# Patient Record
Sex: Female | Born: 1935
Health system: Southern US, Community
[De-identification: ages and names within clinical notes are randomized; demographics above are authoritative.]

## PROBLEM LIST (undated history)

## (undated) DIAGNOSIS — Z87898 Personal history of other specified conditions: Secondary | ICD-10-CM

## (undated) DIAGNOSIS — R569 Unspecified convulsions: Secondary | ICD-10-CM

## (undated) DIAGNOSIS — I2510 Atherosclerotic heart disease of native coronary artery without angina pectoris: Secondary | ICD-10-CM

## (undated) DIAGNOSIS — M1990 Unspecified osteoarthritis, unspecified site: Secondary | ICD-10-CM

## (undated) DIAGNOSIS — I10 Essential (primary) hypertension: Secondary | ICD-10-CM

## (undated) DIAGNOSIS — J849 Interstitial pulmonary disease, unspecified: Secondary | ICD-10-CM

## (undated) DIAGNOSIS — L409 Psoriasis, unspecified: Secondary | ICD-10-CM

## (undated) DIAGNOSIS — F32A Depression, unspecified: Secondary | ICD-10-CM

## (undated) DIAGNOSIS — F329 Major depressive disorder, single episode, unspecified: Secondary | ICD-10-CM

## (undated) DIAGNOSIS — K219 Gastro-esophageal reflux disease without esophagitis: Secondary | ICD-10-CM

## (undated) DIAGNOSIS — E7800 Pure hypercholesterolemia, unspecified: Secondary | ICD-10-CM

## (undated) HISTORY — PX: DILATION AND CURETTAGE OF UTERUS: SHX78

## (undated) HISTORY — DX: Unspecified convulsions: R56.9

## (undated) HISTORY — DX: Atherosclerotic heart disease of native coronary artery without angina pectoris: I25.10

## (undated) HISTORY — DX: Essential (primary) hypertension: I10

## (undated) HISTORY — PX: WRIST SURGERY: SHX841

## (undated) HISTORY — DX: Personal history of other specified conditions: Z87.898

## (undated) HISTORY — DX: Interstitial pulmonary disease, unspecified: J84.9

## (undated) HISTORY — DX: Pure hypercholesterolemia, unspecified: E78.00

## (undated) HISTORY — DX: Psoriasis, unspecified: L40.9

---

## 2011-01-24 HISTORY — PX: COLONOSCOPY: SHX174

## 2017-11-03 ENCOUNTER — Other Ambulatory Visit (INDEPENDENT_AMBULATORY_CARE_PROVIDER_SITE_OTHER): Payer: Medicare HMO

## 2017-11-03 ENCOUNTER — Encounter: Payer: Self-pay | Admitting: Internal Medicine

## 2017-11-03 ENCOUNTER — Ambulatory Visit (INDEPENDENT_AMBULATORY_CARE_PROVIDER_SITE_OTHER): Payer: Medicare HMO | Admitting: Internal Medicine

## 2017-11-03 VITALS — BP 124/64 | HR 72 | Ht 65.0 in | Wt 150.4 lb

## 2017-11-03 DIAGNOSIS — Z872 Personal history of diseases of the skin and subcutaneous tissue: Secondary | ICD-10-CM

## 2017-11-03 DIAGNOSIS — R0609 Other forms of dyspnea: Secondary | ICD-10-CM

## 2017-11-03 DIAGNOSIS — J849 Interstitial pulmonary disease, unspecified: Secondary | ICD-10-CM | POA: Diagnosis not present

## 2017-11-03 DIAGNOSIS — R053 Chronic cough: Secondary | ICD-10-CM

## 2017-11-03 DIAGNOSIS — R05 Cough: Secondary | ICD-10-CM

## 2017-11-03 DIAGNOSIS — Z87891 Personal history of nicotine dependence: Secondary | ICD-10-CM | POA: Diagnosis not present

## 2017-11-03 LAB — SEDIMENTATION RATE: Sed Rate: 34 mm/hr — ABNORMAL HIGH (ref 0–30)

## 2017-11-03 NOTE — Patient Instructions (Signed)
ICD-10-CM   1. ILD (interstitial lung disease) ([place]) J84.9   2. History of psoriasis Z87.2   3. Stopped smoking with greater than 40 pack year history Z87.891   4. Dyspnea on exertion R06.09   5. Chronic cough R05      - I am concerned you  have Interstitial Lung Disease (ILD)  -  There are many varieties of this - To narrow down possibilities and assess severity please do the following tests  Plan  - do HRCT supine and prone at [place] and dlco only. No lung volume or bd response. No post-bd spiro - can do at [facility] - On 11/03/2017 do Serum: ESR, ACE, ANA, DS-DNA, RF, anti-CCP, ssA, ssB, scl-70, ANCA screen, MPO, PR-3, Total CK,  RNP, Aldolase,  Hypersensitivity Pneumonitis Panel   Followup - next few to several weeks at ILD clinic tue PM to regroup about results

## 2017-11-03 NOTE — Progress Notes (Signed)
Subjective:    Patient ID: Ashlee Martin, female    DOB: 03/02/1931, 84 y.o.   MRN: [id number]  PCP Ashlee Chroman, MD  HPI  IOV 11/03/2017  Chief Complaint  Patient presents with  . Consult    Self referral. Pt states she was diagnosed with pulmonary fibrosis x4 weeks ago. States she has c/o cough with green mucus, mild SOB. Denies any CP.    84 year old female self-referred for evaluation of pulmonary fibrosis.  She is fairly good historian.  She tells me that she has diagnosis of psoriasis of the skin in 2016 and was under the care of Ashlee Martin in [place].  She was on Humira for 11 months taking monthly injections but her rash got bad in November 2017 and then she was treated with prednisone.  She went on vacation to [place] and then returned and then had a diagnosis of pneumonia.  She was hospitalized for this at [facility] in January 2018.  She was on oxygen and hospitalized for 3 or 4 days and then discharged.  After that she is always had some amount of residual cough and mild shortness of breath but starting approximately 6 months ago the cough and shortness of breath was more perceptible.  However in the last 2 months a significant acceleration in the severity of cough and shortness of breath.  The cough is described as waking up at night and associated with phlegm.  She also gets short of breath walking or hurrying on level ground or walking up a slight hill.  Her primary care physician did a CT chest with contrast in February 2019.  I do not have the image with me but I reviewed the report and he reports pulmonary fibrosis not otherwise specified.  She did end up in the emergency room on October 31, 2017 and is being discharged with [facility] and redness on taper which is helping.   [employer] of chest physicians interstitial lung disease questionnaire  Past medical history: Positive for seizures, history of pneumonia in January 2018, acid reflux disease, dry eyes, dry mouth,  photophobia and arthralgia.  She has diagnosis of skin psoriasis.  This is not much of an issue at this point in time  Personal exposure history: She started smoking cigarettes at 84 years of age and smoked 3-4 packs a day and quit in the mid 1980s.  She reports having had use street drugs.  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  She only occasionally visits the coop.  She opens the door and looks that she can out.  Otherwise no exposure to water damage or mold a humidifier [place] or hot tub or Jacuzzi or birds or feathered pillows.  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  Then she retired and moved to [place] in [place] where she lived in normal homes for 5 years up until 8 years ago and then she and her husband moved to [place], [place] where they live in a farm.  Pulmonary toxicity history: Other than exposure to Humira and short-term prednisone no other pulmonary toxicity history including nitrofurantoin or BCG or amiodarone or chemotherapy    Walking desaturation test on 11/03/2017 185 feet x 3 laps on ROOM AIR:  did walk at slow pace and got mildly dyspneic. Did desaturate to 89%. Rest pulse ox was 98%, final pulse ox  was 89%. HR response 67/min at rest to 91/min at peak exertion. [name]  Did not Desaturate < 88% . Ashlee Martin did yes  Desaturated </= 3% points. Ashlee Martin yes did get tachyardic      has no past medical history on file.   reports that she quit smoking about 45 years ago. Her smoking use included cigarettes. She has a 60.00 pack-year smoking history. she has never used smokeless tobacco.   Allergies  Allergen Reactions  . Humira [Adalimumab]     Immunization History  Administered Date(s) Administered  . Influenza, High Dose Seasonal PF 05/25/2017    No family history on file.   Current Outpatient  Medications:  .  aspirin (GOODSENSE ASPIRIN) 325 MG tablet, Take 325 mg by mouth., Disp: , Rfl:  .  atenolol (TENORMIN) 50 MG tablet, , Disp: , Rfl:  .  benzonatate (TESSALON) 100 MG capsule, TAKE 1 TO 2 CAPSULES BY MOUTH THREE TIMES DAILY AS NEEDED FOR CONGESTION AND COUGH, Disp: , Rfl: 0 .  cefdinir (OMNICEF) 300 MG capsule, Take 300 mg by mouth 2 (two) times daily., Disp: , Rfl: 0 .  doxylamine, Sleep, (UNISOM) 25 MG tablet, Take 25 mg by mouth at bedtime as needed., Disp: , Rfl:  .  predniSONE (DELTASONE) 20 MG tablet, TAKE 1 TAB BY MOUTH 3 TIMES A DAY FOR 4 DAYS THEN 1 TWICE A DAY FOR 3 DAYS THEN 1 DAILY FOR 2 DAYS, Disp: , Rfl: 0 .  rOPINIRole (REQUIP) 2 MG tablet, , Disp: , Rfl:  .  simvastatin (ZOCOR) 20 MG tablet, , Disp: , Rfl:  .  VENTOLIN HFA 108 (90 Base) MCG/ACT inhaler, INHALE 1 TO 2 PUFFS BY MOUTH FOUR TIMES A DAY AS NEEDED FOR CHEST CONGESTION/COUGH, Disp: , Rfl: 0 .  vitamin B-12 (CYANOCOBALAMIN) 1000 MCG tablet, Take 1,000 mcg by mouth daily., Disp: , Rfl:     Review of Systems  Constitutional: Negative for fever and unexpected weight change.  HENT: Positive for congestion, postnasal drip, sinus pressure and sneezing. Negative for dental problem, ear pain, nosebleeds, rhinorrhea, sore throat and trouble swallowing.   Eyes: Negative for redness and itching.  Respiratory: Positive for cough, shortness of breath and wheezing. Negative for chest tightness.   Cardiovascular: Negative for palpitations and leg swelling.  Gastrointestinal: Positive for nausea and vomiting.  Genitourinary: Negative for dysuria.  Musculoskeletal: Positive for joint swelling.  Skin: Negative for rash.  Allergic/Immunologic: Negative.  Negative for environmental allergies, food allergies and immunocompromised state.  Neurological: Positive for headaches.  Hematological: Does not bruise/bleed easily.  Psychiatric/Behavioral: Positive for dysphoric mood. The patient is nervous/anxious.          Objective:   Physical Exam  Constitutional: She is oriented to person, place, and time. She appears well-developed and well-nourished. No distress.  Somewhat frail  HENT:  Head: Normocephalic and atraumatic.  Right Ear: External ear normal.  Left Ear: External ear normal.  Mouth/Throat: Oropharynx is clear and moist. No oropharyngeal exudate.  Eyes: Conjunctivae and EOM are normal. Pupils are equal, round, and reactive to light. Right eye exhibits no discharge. Left eye exhibits no discharge. No scleral icterus.  Neck: Normal range of motion. Neck supple. No JVD present. No tracheal deviation present. No thyromegaly present.  Cardiovascular: Normal rate, regular rhythm, normal heart sounds and intact distal pulses. Exam reveals no gallop and no friction rub.  No murmur heard. Pulmonary/Chest: Effort normal. No respiratory distress. She has no wheezes. She has rales.  She exhibits no tenderness.  Crackles at base and bilateral UL. Not classic UIP crackkles  Abdominal: Soft. Bowel sounds are normal. She exhibits no distension and no mass. There is no tenderness. There is no rebound and no guarding.  Musculoskeletal: Normal range of motion. She exhibits no edema or tenderness.  Lymphadenopathy:    She has no cervical adenopathy.  Neurological: She is alert and oriented to person, place, and time. She has normal reflexes. No cranial nerve deficit. She exhibits normal muscle tone. Coordination normal.  Skin: Skin is warm and dry. No rash noted. She is not diaphoretic. No erythema. No pallor.  Psychiatric: She has a normal mood and affect. Her behavior is normal. Judgment and thought content normal.  Vitals reviewed.   Vitals:   11/03/17 0901  BP: 124/64  Pulse: 72  SpO2: 96%  Weight: 150 lb 6.4 oz (68.2 kg)  Height: _0  (1.651 m)   Body mass index is 25.03 kg/m.       Assessment & Plan:     ICD-10-CM   1. ILD (interstitial lung disease) ([place]) J84.9   2. History of psoriasis  Z87.2   3. Stopped smoking with greater than 40 pack year history Z87.891   4. Dyspnea on exertion R06.09   5. Chronic cough R05        - I am concerned you  have Interstitial Lung Disease (ILD) - ddx is HP, NSIP, DIP based on smoking, Connective Tissue ILD and then IPF  -- To narrow down possibilities and assess severity please do the following tests  Plan  - do HRCT supine and prone at [place] and dlco only. No lung volume or bd response. No post-bd spiro - can do at [facility] - On 11/03/2017 do Serum: ESR, ACE, ANA, DS-DNA, RF, anti-CCP, ssA, ssB, scl-70, ANCA screen, MPO, PR-3, Total CK,  RNP, Aldolase,  Hypersensitivity Pneumonitis Panel   Followup - next few to several weeks at ILD clinic tue PM to regroup about results     Dr. Brand Males, M.D., [facility].C.P Pulmonary and Critical Care Medicine Staff Physician, [place] Director - Interstitial Lung Disease  Program  Pulmonary [place] at [place], [place], [address]  Pager: [phone], If no answer or between  15:00h - 7:00h: call [phone] Telephone: [phone]

## 2017-11-04 LAB — RNP ANTIBODIES

## 2017-11-05 LAB — ANA: ANA: NEGATIVE

## 2017-11-09 LAB — SJOGREN'S SYNDROME ANTIBODS(SSA + SSB)
SSA (Ro) (ENA) Antibody, IgG: <1 AI
SSB (La) (ENA) Antibody, IgG: 1.8 AI — AB

## 2017-11-09 LAB — CK TOTAL AND CKMB (NOT AT ARMC)
CK TOTAL: 38 U/L (ref 29–143)
CK, MB: 1.4 ng/mL (ref 0–5.0)
Relative Index: 3.7 (ref 0–4.0)

## 2017-11-09 LAB — ANTI-SCLERODERMA ANTIBODY: Scleroderma (Scl-70) (ENA) Antibody, IgG: <1 AI

## 2017-11-09 LAB — HYPERSENSITIVITY PNUEMONITIS PROFILE
ASPERGILLUS FUMIGATUS: NEGATIVE
FAENIA RETIVIRGULA: NEGATIVE
Pigeon Serum: NEGATIVE
S. VIRIDIS: NEGATIVE
T. CANDIDUS: NEGATIVE
T. VULGARIS: NEGATIVE

## 2017-11-09 LAB — ANA IFA W/REFL: Anti Nuclear Antibody(ANA): NEGATIVE

## 2017-11-09 LAB — MPO/PR-3 (ANCA) ANTIBODIES

## 2017-11-09 LAB — CYCLIC CITRUL PEPTIDE ANTIBODY, IGG: Cyclic Citrullin Peptide Ab: <16 UNITS

## 2017-11-09 LAB — RHEUMATOID FACTOR

## 2017-11-09 LAB — ANTI-DNA ANTIBODY, DOUBLE-STRANDED

## 2017-11-09 LAB — ALDOLASE: ALDOLASE: 4.9 U/L (ref <=8.1)

## 2017-11-09 LAB — ANGIOTENSIN CONVERTING ENZYME: ANGIOTENSIN-CONVERTING ENZYME: 28 U/L (ref 9–67)

## 2017-11-09 LAB — ANCA SCREEN W REFLEX TITER: ANCA Screen: NEGATIVE

## 2017-11-10 ENCOUNTER — Telehealth: Payer: Self-pay | Admitting: Internal Medicine

## 2017-11-10 MED ORDER — BENZONATATE 100 MG PO CAPS: ORAL_CAPSULE | ORAL | 0 refills | Status: DC

## 2017-11-10 NOTE — Telephone Encounter (Signed)
Spoke with pt, she states she would like a refill on Prednisone and Tessalon. She states the prednisone is the only thing that is keeping her a float with her lungs and keeps her from coughing so much. I see she was on a taper, can we send in Prednisone 20 mg? She only takes one pill daily. Please advise if I can send in because I wasn't sure looking at her last OV.   Current Outpatient Medications on File Prior to Visit  Medication Sig Dispense Refill  . aspirin (GOODSENSE ASPIRIN) 325 MG tablet Take 325 mg by mouth.    Marland Kitchen atenolol (TENORMIN) 50 MG tablet     . cefdinir (OMNICEF) 300 MG capsule Take 300 mg by mouth 2 (two) times daily.  0  . doxylamine, Sleep, (UNISOM) 25 MG tablet Take 25 mg by mouth at bedtime as needed.    . predniSONE (DELTASONE) 20 MG tablet TAKE 1 TAB BY MOUTH 3 TIMES A DAY FOR 4 DAYS THEN 1 TWICE A DAY FOR 3 DAYS THEN 1 DAILY FOR 2 DAYS  0  . rOPINIRole (REQUIP) 2 MG tablet     . simvastatin (ZOCOR) 20 MG tablet     . VENTOLIN HFA 108 (90 Base) MCG/ACT inhaler INHALE 1 TO 2 PUFFS BY MOUTH FOUR TIMES A DAY AS NEEDED FOR CHEST CONGESTION/COUGH  0  . vitamin B-12 (CYANOCOBALAMIN) 1000 MCG tablet Take 1,000 mcg by mouth daily.     No current facility-administered medications on file prior to visit.    Allergies  Allergen Reactions  . Humira [Adalimumab]

## 2017-11-12 MED ORDER — BENZONATATE 100 MG PO CAPS: ORAL_CAPSULE | ORAL | 0 refills | Status: DC

## 2017-11-12 MED ORDER — PREDNISONE 20 MG PO TABS: ORAL_TABLET | ORAL | 0 refills | Status: DC

## 2017-11-12 NOTE — Telephone Encounter (Signed)
Spoke with the pt and notified of recs per RA  She verbalized understanding  Rx refills sent  Will forward to MR to address prednisone going forward, as I only sent 7 days worth per RA's request

## 2017-11-12 NOTE — Telephone Encounter (Signed)
MR note does not address OK to give 1 week supply of predniosone until he can address  OK to refill tessalon

## 2017-11-12 NOTE — Telephone Encounter (Signed)
Sending to DOD, as MR is unavailable.   RA please advise. thanks

## 2017-11-13 ENCOUNTER — Ambulatory Visit (HOSPITAL_COMMUNITY)
Admission: RE | Admit: 2017-11-13 | Discharge: 2017-11-13 | Disposition: A | Discharge status: Home / Self Care | Payer: Medicare HMO | Source: Ambulatory Visit | Attending: Internal Medicine | Admitting: Internal Medicine

## 2017-11-13 ENCOUNTER — Encounter (HOSPITAL_COMMUNITY): Payer: Self-pay | Admitting: Radiology

## 2017-11-13 DIAGNOSIS — J849 Interstitial pulmonary disease, unspecified: Secondary | ICD-10-CM | POA: Diagnosis not present

## 2017-11-13 DIAGNOSIS — I2510 Atherosclerotic heart disease of native coronary artery without angina pectoris: Secondary | ICD-10-CM | POA: Diagnosis not present

## 2017-11-13 DIAGNOSIS — I700 Atherosclerosis of aorta: Secondary | ICD-10-CM | POA: Insufficient documentation

## 2017-11-16 NOTE — Telephone Encounter (Signed)
MR, please advise about the future prednisone refills. Thanks!

## 2017-11-16 NOTE — Telephone Encounter (Signed)
lmtcb x1 for pt. 

## 2017-11-16 NOTE — Telephone Encounter (Signed)
Please call Ranee Ertl - I am just starting ILD workup . Results so far  1. Does have ILD  2. Autoimmune fairly negatie 3. Await PFT and OV  So,  `1. Was she on chronic daily prednisone before seeing me? If so by whom and since when? Because I thought she was only on taper  2. Tessalon ok to refill   Thanks  Dr. Brand Males, M.D., [facility].C.P Pulmonary and Critical Care Medicine Staff Physician, [place] Director - Interstitial Lung Disease  Program  Pulmonary [place] at [place], [place], [address]  Pager: [phone], If no answer or between  15:00h - 7:00h: call [phone] Telephone: [phone]

## 2017-11-17 NOTE — Telephone Encounter (Signed)
Attempted to contact pt. No answer, no option to leave a message. Will try back.  

## 2017-11-18 ENCOUNTER — Telehealth: Payer: Self-pay | Admitting: Internal Medicine

## 2017-11-18 NOTE — Telephone Encounter (Signed)
Please telephone encounter 11/10/17. lmtcb x1 for pt.

## 2017-11-18 NOTE — Telephone Encounter (Signed)
ATC x 3. No VM. Will close this message.

## 2017-11-19 MED ORDER — PREDNISONE 10 MG PO TABS: ORAL_TABLET | ORAL | 0 refills | Status: DC

## 2017-11-19 NOTE — Telephone Encounter (Signed)
MR, pt currently has the appt scheduled with you 5/7.  Does she need to come in for an appt sooner than that to discuss the prednisone with you or do you want to wait until that visit on 5/7 to address med with pt?

## 2017-11-19 NOTE — Telephone Encounter (Signed)
Called and spoke with patient, advised her of response from MR. I will send the prescription into the pharmacy and will route this message to EP so that she can find a spot for the patient to come in after the prednisone is finished.   EP please follow up on this, thanks.

## 2017-11-19 NOTE — Telephone Encounter (Signed)
Called and spoke with pt letting her know that she did have ILD and stated to her the results of the labwork.  Asked pt who previously prescribed her the prednisone and asked her if she was taking prednisone every day or if it was a pred taper.  Pt stated to me Dr. Woody Seller was the previous one who prescribed prednisone to her and it was a pred taper she was doing.  Pt stated once she was not on prednisone, she got sick again with all the same symptoms she previously had when she was not on it.  MR, please advise if you want to put pt on a maintenance dose of prednisone for her to take daily or if you want to wait until her upcoming OV with you 12/29/17 to discuss everything with her after the PFT is done?

## 2017-11-19 NOTE — Telephone Encounter (Signed)
Wait till 12/29/17

## 2017-11-19 NOTE — Telephone Encounter (Signed)
Noted.  Called pt to make her aware we would just address prednisone at her upcoming visit on 12/29/17.  Nothing further needed at this time.

## 2017-11-19 NOTE — Telephone Encounter (Signed)
She can do another 8d prednisone but after that need to seee her again to discus  Take prednisone 40 mg daily x 2 days, then 36m daily x 2 days, then 123mdaily x 2 days, then 26m2maily x 2 days and stop

## 2017-11-26 ENCOUNTER — Other Ambulatory Visit: Payer: Self-pay | Admitting: Internal Medicine

## 2017-12-08 ENCOUNTER — Ambulatory Visit (INDEPENDENT_AMBULATORY_CARE_PROVIDER_SITE_OTHER): Payer: Medicare HMO | Admitting: Adult Health

## 2017-12-08 ENCOUNTER — Encounter: Payer: Self-pay | Admitting: Adult Health

## 2017-12-08 ENCOUNTER — Telehealth: Payer: Self-pay | Admitting: Adult Health

## 2017-12-08 DIAGNOSIS — J309 Allergic rhinitis, unspecified: Secondary | ICD-10-CM | POA: Insufficient documentation

## 2017-12-08 DIAGNOSIS — J301 Allergic rhinitis due to pollen: Secondary | ICD-10-CM

## 2017-12-08 DIAGNOSIS — J84112 Idiopathic pulmonary fibrosis: Secondary | ICD-10-CM | POA: Insufficient documentation

## 2017-12-08 DIAGNOSIS — J849 Interstitial pulmonary disease, unspecified: Secondary | ICD-10-CM | POA: Diagnosis not present

## 2017-12-08 NOTE — Telephone Encounter (Signed)
Called patient, unable to reach left message to give [place] a call back.

## 2017-12-08 NOTE — Assessment & Plan Note (Signed)
ILD , autoimmune neg workup  PFT pending  Cough improved after steroids   Plan  Patient Instructions  Begin Zyrtec 62m At bedtime  .  Begin Saline  Nasal spray /Ashlee AlstromTwice daily  .  Begin Flonase 2 puffs daily in am .  Follow up with Dr. RChase Callernext month for PFT and .As needed   Please contact office for sooner follow up if symptoms do not improve or worsen or seek emergency care

## 2017-12-08 NOTE — Assessment & Plan Note (Signed)
Seasonal allergies   Plan  Patient Instructions  Begin Zyrtec 66m At bedtime  .  Begin Saline  Nasal spray /Laretta AlstromTwice daily  .  Begin Flonase 2 puffs daily in am .  Follow up with Dr. RChase Callernext month for PFT and .As needed   Please contact office for sooner follow up if symptoms do not improve or worsen or seek emergency care

## 2017-12-08 NOTE — Progress Notes (Signed)
_0  ID: Ashlee Martin, female    DOB: [birth year], 84 y.o.   MRN: [id number]  Chief Complaint  Patient presents with  . Acute Visit    Referring provider: Glenda Chroman, MD  HPI:  84 year old female former smoker seen for pulmonary consult March 2019 for pulmonary fibrosis Past medical history significant for psoriasis   12/08/2017 Acute OV :Sinus congestion .  Patient presents for an acute office visit.  Patient complains of sinus congestion , ear fullness and popping , sinus pressure , drippy nose, watery eyes, very little cough .  Has had this for last few months , Worse for the last 2 weeks .  Seen by PCP last week started on allergy pill and nasal spray , she does not know what they were.  Says she has chronic allergy problems , worse in Spring and Fall.  She denies any discolored mucus, sinus pain, teeth pain, fever, nausea vomiting diarrhea, or chest pain.    Patient was seen for pulmonary consult March 2019 to establish for pulmonary fibrosis.  She has underlying psoriasis.  Patient was set up for a CT chest that showed fibrotic interstitial lung disease with mild honeycombing.  Autoimmune panel was essentially negative except for SS B IgG antibody.  And sed rate was mildly elevated at 34. Patient has an upcoming PFT and follow-up visit with ILD clinic in 2 weeks. She was having a severe cough last visit.  She was given a prednisone taper.  Patient says she did require one other prednisone taper but her cough is almost totally resolved.  She has a little mild cough in the morning but very mild.  Allergies  Allergen Reactions  . Humira [Adalimumab] Hives    Immunization History  Administered Date(s) Administered  . Influenza, High Dose Seasonal PF 05/25/2017  . Pneumococcal Conjugate-13 12/09/2014    History reviewed. No pertinent past medical history.  Tobacco History: Social History   Tobacco Use  Smoking Status Former Smoker  . Packs/day: 3.00  . Years:  20.00  . Pack years: 60.00  . Types: Cigarettes  . Last attempt to quit: 11/03/1972  . Years since quitting: 45.1  Smokeless Tobacco Never Used   Counseling given: Not Answered   Outpatient Encounter Medications as of 12/08/2017  Medication Sig  . aspirin (GOODSENSE ASPIRIN) 325 MG tablet Take 325 mg by mouth.  Marland Kitchen atenolol (TENORMIN) 50 MG tablet   . benzonatate (TESSALON) 100 MG capsule TAKE 1 TO 2 CAPSULES BY MOUTH THREE TIMES DAILY AS NEEDED FOR CONGESTION AND COUGH  . doxylamine, Sleep, (UNISOM) 25 MG tablet Take 25 mg by mouth at bedtime as needed.  . predniSONE (DELTASONE) 10 MG tablet 40 mg daily x 2 days, then 44m daily x 2 days, then 168mdaily x 2 days, then 74m71maily x 2 days and stop  . rOPINIRole (REQUIP) 2 MG tablet   . simvastatin (ZOCOR) 20 MG tablet   . VENTOLIN HFA 108 (90 Base) MCG/ACT inhaler INHALE 1 TO 2 PUFFS BY MOUTH FOUR TIMES A DAY AS NEEDED FOR CHEST CONGESTION/COUGH  . vitamin B-12 (CYANOCOBALAMIN) 1000 MCG tablet Take 1,000 mcg by mouth daily.  . [DISCONTINUED] benzonatate (TESSALON) 100 MG capsule TAKE 1 TO 2 CAPSULES BY MOUTH THREE TIMES DAILY AS NEEDED FOR CONGESTION AND COUGH  . [DISCONTINUED] cefdinir (OMNICEF) 300 MG capsule Take 300 mg by mouth 2 (two) times daily.   No facility-administered encounter medications on file as of 12/08/2017.  Review of Systems  Constitutional:   No  weight loss, night sweats,  Fevers, chills, fatigue, or  lassitude.  HEENT:   No headaches,  Difficulty swallowing,  Tooth/dental problems, or  Sore throat,                No sneezing, itching, ear ache,  +nasal congestion, post nasal drip,   CV:  No chest pain,  Orthopnea, PND, swelling in lower extremities, anasarca, dizziness, palpitations, syncope.   GI  No heartburn, indigestion, abdominal pain, nausea, vomiting, diarrhea, change in bowel habits, loss of appetite, bloody stools.   Resp:  .  No chest wall deformity  Skin: no rash or lesions.  GU: no  dysuria, change in color of urine, no urgency or frequency.  No flank pain, no hematuria   MS:  No joint pain or swelling.  No decreased range of motion.  No back pain.    Physical Exam  BP 128/68 (BP Location: Left Arm, Cuff Size: Normal)   Pulse 73   Ht _0  (1.651 m)   Wt 156 lb 12.8 oz (71.1 kg)   SpO2 93%   BMI 26.09 kg/m   GEN: A/Ox3; pleasant , NAD, elderly    HEENT:  [place]/AT,  EACs-clear, TMs-wnl, NOSE-clear drainage , THROAT-clear, no lesions, no postnasal drip or exudate noted.   NECK:  Supple w/ fair ROM; no JVD; normal carotid impulses w/o bruits; no thyromegaly or nodules palpated; no lymphadenopathy.    RESP  Clear  P & A; w/o, wheezes/ rales/ or rhonchi. no accessory muscle use, no dullness to percussion  CARD:  RRR, no m/r/g, no peripheral edema, pulses intact, no cyanosis or clubbing.  GI:   Soft & nt; nml bowel sounds; no organomegaly or masses detected.   Musco: Warm bil, no deformities or joint swelling noted.   Neuro: alert, no focal deficits noted.    Skin: Warm, no lesions or rashes    Lab Results:  CBC No results found for: WBC, RBC, HGB, HCT, PLT, MCV, MCH, MCHC, RDW, LYMPHSABS, MONOABS, EOSABS, BASOSABS  BMET No results found for: NA, K, CL, CO2, GLUCOSE, BUN, CREATININE, CALCIUM, GFRNONAA, GFRAA  BNP No results found for: BNP  ProBNP No results found for: PROBNP  Imaging: Ct Chest High Resolution  Result Date: 11/13/2017 CLINICAL DATA:  Dyspnea.  Evaluate for interstitial lung disease. EXAM: CT CHEST WITHOUT CONTRAST TECHNIQUE: Multidetector CT imaging of the chest was performed following the standard protocol without intravenous contrast. High resolution imaging of the lungs, as well as inspiratory and expiratory imaging, was performed. COMPARISON:  None. FINDINGS: Cardiovascular: Normal heart size. No significant pericardial fluid/thickening. Three-vessel coronary atherosclerosis. Atherosclerotic nonaneurysmal thoracic aorta. Dilated  main pulmonary artery (3.5 cm diameter). Mediastinum/Nodes: No discrete thyroid nodules. Unremarkable esophagus. No pathologically enlarged axillary, mediastinal or gross hilar lymph nodes, noting limited sensitivity for the detection of hilar adenopathy on this noncontrast study. Lungs/Pleura: No pneumothorax. No pleural effusion. No acute consolidative airspace disease or lung masses. Tiny 3 mm peripheral left lower lobe solid pulmonary nodule (series 5/image 113). No additional significant pulmonary nodules. No significant air trapping on expiration sequence. There is patchy confluent subpleural reticulation and ground-glass attenuation throughout both lungs with associated mild traction bronchiectasis and architectural distortion. No clear basilar gradient to these findings. There is scattered mild honeycombing in the superior segment dependent right lower lobe and anterior Germond lobes bilaterally. Gearhart abdomen: Mild colonic diverticulosis. Musculoskeletal: No aggressive appearing focal osseous lesions. Moderate thoracic spondylosis. IMPRESSION: 1.  Spectrum of findings compatible with fibrotic interstitial lung disease with mild honeycombing. Despite the absence of a clear basilar gradient, these findings are considered to represent probable usual interstitial pneumonia (UIP). Fibrotic phase nonspecific interstitial pneumonia (NSIP) is on the differential. Follow-up high-resolution chest CT in 6-12 months would be useful to assess temporal pattern stability, as clinically warranted. 2. Tiny 3 mm solid left lower lobe pulmonary nodule. No follow-up needed if patient is low-risk. Non-contrast chest CT can be considered in 12 months if patient is high-risk. This recommendation follows the consensus statement: Guidelines for Management of Incidental Pulmonary Nodules Detected on CT Images:From the Fleischner Society 2017; published online before print (10.1148/radiol.[id number]). 3. Three-vessel coronary  atherosclerosis. 4. Dilated main pulmonary artery, suggesting pulmonary arterial hypertension. Aortic Atherosclerosis (ICD10-I70.0). Electronically Signed   By: Ilona Sorrel M.D.   On: 11/13/2017 16:46     Assessment & Plan:   Allergic rhinitis Seasonal allergies   Plan  Patient Instructions  Begin Zyrtec 75m At bedtime  .  Begin Saline  Nasal spray /Laretta AlstromTwice daily  .  Begin Flonase 2 puffs daily in am .  Follow up with Dr. RChase Callernext month for PFT and .As needed   Please contact office for sooner follow up if symptoms do not improve or worsen or seek emergency care        [place] , autoimmune neg workup  PFT pending  Cough improved after steroids   Plan  Patient Instructions  Begin Zyrtec 17mAt bedtime  .  Begin Saline  Nasal spray /rLaretta Alstromwice daily  .  Begin Flonase 2 puffs daily in am .  Follow up with Dr. RaChase Callerext month for PFT and .As needed   Please contact office for sooner follow up if symptoms do not improve or worsen or seek emergency care           TaRexene EdisonNP 12/08/2017

## 2017-12-08 NOTE — Patient Instructions (Addendum)
Begin Zyrtec 71m At bedtime  .  Begin Saline  Nasal spray /Laretta AlstromTwice daily  .  Begin Flonase 2 puffs daily in am .  Follow up with Dr. RChase Callernext month for PFT and .As needed   Please contact office for sooner follow up if symptoms do not improve or worsen or seek emergency care

## 2017-12-09 NOTE — Telephone Encounter (Signed)
Called patient unable to reach left message to give [place] a call back.

## 2017-12-10 NOTE — Telephone Encounter (Signed)
Called pt and advised message from the provider. Pt understood and verbalized understanding. Nothing further is needed.

## 2017-12-10 NOTE — Telephone Encounter (Signed)
Recommend Sudafed from pharmacy counter, or Mucinex-D.

## 2017-12-10 NOTE — Telephone Encounter (Signed)
Spoke with pt, she states her head is very stuffy and she wants advise on what to take. She states she is having a bad day today and she took claritin and Azelastine for symptoms. She doesn't have any other symptoms but she does complain of her ears being stuffed and feels like she is in a tunnel of water. Please advise DOD  [facility]  Current Outpatient Medications on File Prior to Visit  Medication Sig Dispense Refill  . aspirin (GOODSENSE ASPIRIN) 325 MG tablet Take 325 mg by mouth.    Marland Kitchen atenolol (TENORMIN) 50 MG tablet     . benzonatate (TESSALON) 100 MG capsule TAKE 1 TO 2 CAPSULES BY MOUTH THREE TIMES DAILY AS NEEDED FOR CONGESTION AND COUGH 20 capsule 0  . doxylamine, Sleep, (UNISOM) 25 MG tablet Take 25 mg by mouth at bedtime as needed.    . predniSONE (DELTASONE) 10 MG tablet 40 mg daily x 2 days, then 26m daily x 2 days, then 168mdaily x 2 days, then 4m27maily x 2 days and stop 16 tablet 0  . rOPINIRole (REQUIP) 2 MG tablet     . simvastatin (ZOCOR) 20 MG tablet     . VENTOLIN HFA 108 (90 Base) MCG/ACT inhaler INHALE 1 TO 2 PUFFS BY MOUTH FOUR TIMES A DAY AS NEEDED FOR CHEST CONGESTION/COUGH  0  . vitamin B-12 (CYANOCOBALAMIN) 1000 MCG tablet Take 1,000 mcg by mouth daily.     No current facility-administered medications on file prior to visit.    Allergies  Allergen Reactions  . Humira [Adalimumab] Hives

## 2017-12-14 ENCOUNTER — Telehealth: Payer: Self-pay | Admitting: Internal Medicine

## 2017-12-14 NOTE — Telephone Encounter (Signed)
ATC patient, received busy tone.   Will call back later.

## 2017-12-16 NOTE — Telephone Encounter (Signed)
Spoke with pt, she states she thinks Corrine called her to follow up on how she was feeling. She states the Mucinex D works for her and she will await her appointment to discuss further. Will close encounter.

## 2017-12-29 ENCOUNTER — Encounter: Payer: Self-pay | Admitting: Internal Medicine

## 2017-12-29 ENCOUNTER — Ambulatory Visit (INDEPENDENT_AMBULATORY_CARE_PROVIDER_SITE_OTHER): Payer: Medicare HMO | Admitting: Internal Medicine

## 2017-12-29 ENCOUNTER — Telehealth: Payer: Self-pay | Admitting: Internal Medicine

## 2017-12-29 VITALS — BP 120/72 | HR 78 | Ht 65.0 in | Wt 159.8 lb

## 2017-12-29 DIAGNOSIS — J849 Interstitial pulmonary disease, unspecified: Secondary | ICD-10-CM

## 2017-12-29 DIAGNOSIS — Z87891 Personal history of nicotine dependence: Secondary | ICD-10-CM

## 2017-12-29 DIAGNOSIS — Y9272 Chicken coop as the place of occurrence of the external cause: Secondary | ICD-10-CM | POA: Diagnosis not present

## 2017-12-29 DIAGNOSIS — Z9225 Personal history of immunosupression therapy: Secondary | ICD-10-CM

## 2017-12-29 LAB — PULMONARY FUNCTION TEST
DL/VA % pred: 53 %
DL/VA: 2.65 ml/min/mmHg/L
DLCO UNC % PRED: 30 %
DLCO UNC: 7.82 ml/min/mmHg
FEF 25-75 Pre: 1.04 L/sec
FEF2575-%PRED-PRE: 83 %
FEV1-%Pred-Pre: 62 %
FEV1-Pre: 1.18 L
FEV1FVC-%PRED-PRE: 108 %
FEV6-%Pred-Pre: 62 %
FEV6-Pre: 1.49 L
FEV6FVC-%Pred-Pre: 106 %
FVC-%Pred-Pre: 58 %
FVC-Pre: 1.49 L
PRE FEV1/FVC RATIO: 79 %
PRE FEV6/FVC RATIO: 100 %

## 2017-12-29 NOTE — Telephone Encounter (Signed)
Pt is needing to have a bronch and the date and time MR is wanting it to be done is 01/07/17, 2pm at [facility].  Attempted to call Baxter Flattery to schedule bronch but unable to speak to her.  Left a message for Baxter Flattery to call me so that I can get bronch scheduled for pt.

## 2017-12-29 NOTE — Progress Notes (Signed)
PFT completed today.

## 2017-12-29 NOTE — H&P (View-Only) (Signed)
Subjective:     Patient ID: Ashlee Martin, female   DOB: April 29, 1931, 84 y.o.   MRN: [id number]  HPI   PCP Glenda Chroman, MD  HPI  IOV 11/03/2017  Chief Complaint  Patient presents with  . Consult    Self referral. Pt states she was diagnosed with pulmonary fibrosis x4 weeks ago. States she has c/o cough with green mucus, mild SOB. Denies any CP.    84 year old female self-referred for evaluation of pulmonary fibrosis.  She is fairly good historian.  She tells me that she has diagnosis of psoriasis of the skin in 2016 and was under the care of Dr. Channing Mutters in [place].  She was on Humira for 11 months taking monthly injections but her rash got bad in November 2017 and then she was treated with prednisone.  She went on vacation to [place] and then returned and then had a diagnosis of pneumonia.  She was hospitalized for this at [facility] in January 2018.  She was on oxygen and hospitalized for 3 or 4 days and then discharged.  After that she is always had some amount of residual cough and mild shortness of breath but starting approximately 6 months ago the cough and shortness of breath was more perceptible.  However in the last 2 months a significant acceleration in the severity of cough and shortness of breath.  The cough is described as waking up at night and associated with phlegm.  She also gets short of breath walking or hurrying on level ground or walking up a slight hill.  Her primary care physician did a CT chest with contrast in February 2019.  I do not have the image with me but I reviewed the report and he reports pulmonary fibrosis not otherwise specified.  She did end up in the emergency room on October 31, 2017 and is being discharged with [facility] and redness on taper which is helping.   American College of chest physicians interstitial lung disease questionnaire  Past medical history: Positive for seizures, history of pneumonia in January 2018, acid reflux disease, dry eyes, dry mouth,  photophobia and arthralgia.  She has diagnosis of skin psoriasis.  This is not much of an issue at this point in time  Personal exposure history: She started smoking cigarettes at 84 years of age and smoked 3-4 packs a day and quit in the mid 1980s.  She reports having had use street drugs.  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  She only occasionally visits the coop.  She opens the door and looks that she can out.  Otherwise no exposure to water damage or mold a humidifier [place] or hot tub or Jacuzzi or birds or feathered pillows.  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  Then she retired and moved to [place] in [place] where she lived in normal homes for 5 years up until 8 years ago and then she and her husband moved to [place], [place] where they live in a farm.  Pulmonary toxicity history: Other than exposure to Humira and short-term prednisone no other pulmonary toxicity history including nitrofurantoin or BCG or amiodarone or chemotherapy    Walking desaturation test on 11/03/2017 185 feet x 3 laps on ROOM AIR:  did walk at slow pace and got mildly dyspneic. Did desaturate to 89%. Rest pulse ox was 98%, final  pulse ox was 89%. HR response 67/min at rest to 91/min at peak exertion. Patient Nikeshia Besse  Did not Desaturate < 88% . Mamye Lasch did yes  Desaturated </= 3% points. Tyleah Basquez yes did get tachyardic    12/08/2017 Acute OV :Sinus congestion .  Patient presents for an acute office visit.  Patient complains of sinus congestion , ear fullness and popping , sinus pressure , drippy nose, watery eyes, very little cough .  Has had this for last few months , Worse for the last 2 weeks .  Seen by PCP last week started on allergy pill and nasal spray , she does not know what they were.  Says she has chronic allergy problems , worse in Spring and Fall.   She denies any discolored mucus, sinus pain, teeth pain, fever, nausea vomiting diarrhea, or chest pain.    Patient was seen for pulmonary consult March 2019 to establish for pulmonary fibrosis.  She has underlying psoriasis.  Patient was set up for a CT chest that showed fibrotic interstitial lung disease with mild honeycombing.  Autoimmune panel was essentially negative except for SS B IgG antibody.  And sed rate was mildly elevated at 34. Patient has an upcoming PFT and follow-up visit with ILD clinic in 2 weeks. She was having a severe cough last visit.  She was given a prednisone taper.  Patient says she did require one other prednisone taper but her cough is almost totally resolved.  She has a little mild cough in the morning but very mild.   OV 12/29/2017  Chief Complaint  Patient presents with  . Follow-up    Pt states she has had both good days and bad days. States she has had some problems with allergies.    Daviana Pendell presns for followup for ILD workup.it is documented below.  Pulmonary function test in terms of severity shows moderate restriction with significant reduction in diffusion capacity.  Her high-resolution CT scan of the chest is read as probable UIP but I wonder if that is mixed emphysema although she quit smoking many decades ago and indeterminate pattern for UIP.  There might be air trapping but this is not reported.  I again went over her exposures and she confirms and her husband who is here with her today that there is significant chicken coop exposure ongoing currently.  There are no new interim issues other than the fact she had a respiratory exacerbation which required prednisone which seemed to help her symptoms considerably.  Currently she is off prednisone.  Her autoimmune vasculitis and hypersensitivity pneumonitis panel was essentially negative except for trace autoimmune positivity.     Results for ANAYLA, GIANNETTI (MRN [id number]) as of 12/29/2017 13:37  Ref.  Range 12/29/2017 12:43  FVC-Pre Latest Units: L 1.49  FVC-%Pred-Pre Latest Units: % 58  FEV1-Pre Latest Units: L 1.18  FEV1-%Pred-Pre Latest Units: % 62  Pre FEV1/FVC ratio Latest Units: % 79  FEV1FVC-%Pred-Pre Latest Units: % 108  Results for LOTTA, FRANKENFIELD (MRN [id number]) as of 12/29/2017 13:37  Ref. Range 12/29/2017 12:43  DLCO unc Latest Units: ml/min/mmHg 7.82  DLCO unc % pred Latest Units: % 30   Results for ELEORA, SUTHERLAND (MRN [id number]) as of 12/29/2017 13:37  Ref. Range 11/03/2017 10:08  ASPERGILLUS FUMIGATUS Latest Ref Range: NEGATIVE  NEGATIVE  Pigeon Serum Latest Ref Range: NEGATIVE  NEGATIVE  Anit Nuclear Antibody(ANA) Latest Ref Range: NEGATIVE  NEGATIVE  Angiotensin-Converting Enzyme Latest Ref Range: 9 - 67 U/L 28  Cyclic Citrullin Peptide Ab Latest Units: UNITS <16  ds DNA Ab Latest Units: IU/mL <1  ENA RNP Ab Latest Ref Range: 0.0 - 0.9 AI <0.2  Myeloperoxidase Abs Latest Units: AI <1.0  Serine Protease 3 Latest Units: AI <1.0  RA Latex Turbid. Latest Ref Range: <14 IU/mL <14  SSA (Ro) (ENA) Antibody, IgG Latest Ref Range: <1.0 NEG AI <1.0 NEG  SSB (La) (ENA) Antibody, IgG Latest Ref Range: <1.0 NEG AI 1.8 POS (A)  Scleroderma (Scl-70) (ENA) Antibody, IgG Latest Ref Range: <1.0 NEG AI <1.0 NEG    IMPRESSION: 1. Spectrum of findings compatible with fibrotic interstitial lung disease with mild honeycombing. Despite the absence of a clear basilar gradient, these findings are considered to represent probable usual interstitial pneumonia (UIP). Fibrotic phase nonspecific interstitial pneumonia (NSIP) is on the differential. Follow-up high-resolution chest CT in 6-12 months would be useful to assess temporal pattern stability, as clinically warranted. 2. Tiny 3 mm solid left lower lobe pulmonary nodule. No follow-up needed if patient is low-risk. Non-contrast chest CT can be considered in 12 months if patient is high-risk. This recommendation follows the consensus statement:  Guidelines for Management of Incidental Pulmonary Nodules Detected on CT Images:From the Fleischner Society 2017; published online before print (10.1148/radiol.[id number]). 3. Three-vessel coronary atherosclerosis. 4. Dilated main pulmonary artery, suggesting pulmonary arterial hypertension.  Aortic Atherosclerosis (ICD10-I70.0).   Electronically Signed   By: Ilona Sorrel M.D.   On: 11/13/2017 16:46   has no past medical history on file.   reports that she quit smoking about 45 years ago. Her smoking use included cigarettes. She has a 60.00 pack-year smoking history. She has never used smokeless tobacco.  No past surgical history on file.  Allergies  Allergen Reactions  . Humira [Adalimumab] Hives    Immunization History  Administered Date(s) Administered  . Influenza, High Dose Seasonal PF 06/10/2017  . Pneumococcal Conjugate-13 12/09/2014    No family history on file.   Current Outpatient Medications:  .  aspirin (GOODSENSE ASPIRIN) 325 MG tablet, Take 325 mg by mouth., Disp: , Rfl:  .  atenolol (TENORMIN) 50 MG tablet, , Disp: , Rfl:  .  dextromethorphan-guaiFENesin (MUCINEX DM) 30-600 MG 12hr tablet, Take 1 tablet by mouth 2 (two) times daily., Disp: , Rfl:  .  doxylamine, Sleep, (UNISOM) 25 MG tablet, Take 25 mg by mouth at bedtime as needed., Disp: , Rfl:  .  rOPINIRole (REQUIP) 2 MG tablet, , Disp: , Rfl:  .  simvastatin (ZOCOR) 20 MG tablet, , Disp: , Rfl:  .  VENTOLIN HFA 108 (90 Base) MCG/ACT inhaler, INHALE 1 TO 2 PUFFS BY MOUTH FOUR TIMES A DAY AS NEEDED FOR CHEST CONGESTION/COUGH, Disp: , Rfl: 0 .  vitamin B-12 (CYANOCOBALAMIN) 1000 MCG tablet, Take 1,000 mcg by mouth daily., Disp: , Rfl:     Review of Systems     Objective:   Physical Exam Vitals:   12/29/17 1324  BP: 120/72  Pulse: 78  SpO2: 93%  Weight: 159 lb 12.8 oz (72.5 kg)  Height: _0  (1.651 m)    Estimated body mass index is 26.59 kg/m as calculated from the following:    Height as of this encounter: _1  (1.651 m).   Weight as of this encounter: 159 lb 12.8 oz (72.5 kg).  Focused exam shows she has crackles scattered throughout Depp lobe and bilateral bilateral lower lobe    Assessment:       ICD-10-CM   1. ILD (interstitial lung  disease) ([place]) J84.9   2. 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 History of prior cigarette smoking Z87.891   4. History of immunosuppression Z92.25    My concern is that she might have chronic hypersensitivity pneumonitis or NSIP.    Plan:     Second opinion thoracic radiology from Dr. Lorin Picket At this point in time I would defer surgical lung biopsy given her age of 11 We will do bronchoscopy with lavage for cell count I explained to her the limitations of non-diagnosis and risks of hemothorax, pneumothorax and sedation complications and diagnostic uncertainty.  Risk and benefits.  Based on all that she has agreed to undergo the procedure which we will try to set up.   We will see her in 4 weeks from now.  In the interim we will do bronchoscopy with lavage.  Will address coronary artery calcification and follow-up.   > 50% of this > 25 min visit spent in face to face counseling or coordination of care    Dr. Brand Males, M.D., [facility].C.P Pulmonary and Critical Care Medicine Staff Physician, [place] Director - Interstitial Lung Disease  Program  Pulmonary [place] [facility] [place], [place], [id number]  Pager: [phone], If no answer or between  15:00h - 7:00h: call [phone] Telephone: [phone]

## 2017-12-29 NOTE — Progress Notes (Signed)
Subjective:     Patient ID: Ashlee Martin, female   DOB: 09/24/1930, 84 y.o.   MRN: [id number]  HPI   PCP Vyas, Dhruv B, MD  HPI  IOV 11/03/2017  Chief Complaint  Patient presents with  . Consult    Self referral. Pt states she was diagnosed with pulmonary fibrosis x4 weeks ago. States she has c/o cough with green mucus, mild SOB. Denies any CP.    84-year-old female self-referred for evaluation of pulmonary fibrosis.  She is fairly good historian.  She tells me that she has diagnosis of psoriasis of the skin in 2016 and was under the care of Dr. Scheffield in [place].  She was on Humira for 11 months taking monthly injections but her rash got bad in November 2017 and then she was treated with prednisone.  She went on vacation to [place] and then returned and then had a diagnosis of pneumonia.  She was hospitalized for this at [place] in January 2018.  She was on oxygen and hospitalized for 3 or 4 days and then discharged.  After that she is always had some amount of residual cough and mild shortness of breath but starting approximately 6 months ago the cough and shortness of breath was more perceptible.  However in the last 2 months a significant acceleration in the severity of cough and shortness of breath.  The cough is described as waking up at night and associated with phlegm.  She also gets short of breath walking or hurrying on level ground or walking up a slight hill.  Her primary care physician did a CT chest with contrast in February 2019.  I do not have the image with me but I reviewed the report and he reports pulmonary fibrosis not otherwise specified.  She did end up in the emergency room on October 31, 2017 and is being discharged with Omnicef and redness on taper which is helping.   American College of chest physicians interstitial lung disease questionnaire  Past medical history: Positive for seizures, history of pneumonia in January 2018, acid reflux disease, dry eyes, dry mouth,  photophobia and arthralgia.  She has diagnosis of skin psoriasis.  This is not much of an issue at this point in time  Personal exposure history: She started smoking cigarettes at 84 years of age and smoked 3-4 packs a day and quit in the mid 1980s.  She reports having had use street drugs.  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  She only occasionally visits the coop.  She opens the door and looks that she can out.  Otherwise no exposure to water damage or mold a humidifier [place] or hot tub or Jacuzzi or birds or feathered pillows.  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  Then she retired and moved to [place] in [place] where she lived in normal homes for 5 years up until 8 years ago and then she and her husband moved to [place], [place] where they live in a farm.  Pulmonary toxicity history: Other than exposure to Humira and short-term prednisone no other pulmonary toxicity history including nitrofurantoin or BCG or amiodarone or chemotherapy    Walking desaturation test on 11/03/2017 185 feet x 3 laps on ROOM AIR:  did walk at slow pace and got mildly dyspneic. Did desaturate to 89%. Rest pulse ox was 98%, final   pulse ox was 89%. HR response 67/min at rest to 91/min at peak exertion. [name]  Did not Desaturate < 88% . Ashlee Martin did yes  Desaturated </= 3% points. Ashlee Martin yes did get tachyardic    12/08/2017 Acute OV :Sinus congestion .  Patient presents for an acute office visit.  Patient complains of sinus congestion , ear fullness and popping , sinus pressure , drippy nose, watery eyes, very little cough .  Has had this for last few months , Worse for the last 2 weeks .  Seen by PCP last week started on allergy pill and nasal spray , she does not know what they were.  Says she has chronic allergy problems , worse in Spring and Fall.   She denies any discolored mucus, sinus pain, teeth pain, fever, nausea vomiting diarrhea, or chest pain.    Patient was seen for pulmonary consult March 2019 to establish for pulmonary fibrosis.  She has underlying psoriasis.  Patient was set up for a CT chest that showed fibrotic interstitial lung disease with mild honeycombing.  Autoimmune panel was essentially negative except for SS B IgG antibody.  And sed rate was mildly elevated at 34. Patient has an upcoming PFT and follow-up visit with ILD clinic in 2 weeks. She was having a severe cough last visit.  She was given a prednisone taper.  Patient says she did require one other prednisone taper but her cough is almost totally resolved.  She has a little mild cough in the morning but very mild.   OV 12/29/2017  Chief Complaint  Patient presents with  . Follow-up    Pt states she has had both good days and bad days. States she has had some problems with allergies.    Ashlee Martin presns for followup for ILD workup.it is documented below.  Pulmonary function test in terms of severity shows moderate restriction with significant reduction in diffusion capacity.  Her high-resolution CT scan of the chest is read as probable UIP but I wonder if that is mixed emphysema although she quit smoking many decades ago and indeterminate pattern for UIP.  There might be air trapping but this is not reported.  I again went over her exposures and she confirms and her husband who is here with her today that there is significant chicken coop exposure ongoing currently.  There are no new interim issues other than the fact she had a respiratory exacerbation which required prednisone which seemed to help her symptoms considerably.  Currently she is off prednisone.  Her autoimmune vasculitis and hypersensitivity pneumonitis panel was essentially negative except for trace autoimmune positivity.     Results for Ashlee, Martin (MRN [id number]) as of 12/29/2017 13:37  Ref.  Range 12/29/2017 12:43  FVC-Pre Latest Units: L 1.49  FVC-%Pred-Pre Latest Units: % 58  FEV1-Pre Latest Units: L 1.18  FEV1-%Pred-Pre Latest Units: % 62  Pre FEV1/FVC ratio Latest Units: % 79  FEV1FVC-%Pred-Pre Latest Units: % 108  Results for Ashlee, Martin (MRN [id number]) as of 12/29/2017 13:37  Ref. Range 12/29/2017 12:43  DLCO unc Latest Units: ml/min/mmHg 7.82  DLCO unc % pred Latest Units: % 30   Results for Ashlee, Martin (MRN [id number]) as of 12/29/2017 13:37  Ref. Range 11/03/2017 10:08  ASPERGILLUS FUMIGATUS Latest Ref Range: NEGATIVE  NEGATIVE  [place] Serum Latest Ref Range: NEGATIVE  NEGATIVE  Anit Nuclear Antibody(ANA) Latest Ref Range: NEGATIVE  NEGATIVE  Angiotensin-Converting Enzyme Latest Ref Range: 9 - 67 U/L 28  Cyclic Citrullin Peptide Ab Latest Units: UNITS <16  ds DNA Ab Latest Units: IU/mL <1  ENA RNP Ab Latest Ref Range: 0.0 - 0.9 AI <0.2  Myeloperoxidase Abs Latest Units: AI <1.0  Serine Protease 3 Latest Units: AI <1.0  RA Latex Turbid. Latest Ref Range: <14 IU/mL <14  SSA (Ro) (ENA) Antibody, IgG Latest Ref Range: <1.0 NEG AI <1.0 NEG  SSB (La) (ENA) Antibody, IgG Latest Ref Range: <1.0 NEG AI 1.8 POS (A)  Scleroderma (Scl-70) (ENA) Antibody, IgG Latest Ref Range: <1.0 NEG AI <1.0 NEG    IMPRESSION: 1. Spectrum of findings compatible with fibrotic interstitial lung disease with mild honeycombing. Despite the absence of a clear basilar gradient, these findings are considered to represent probable usual interstitial pneumonia (UIP). Fibrotic phase nonspecific interstitial pneumonia (NSIP) is on the differential. Follow-up high-resolution chest CT in 6-12 months would be useful to assess temporal pattern stability, as clinically warranted. 2. Tiny 3 mm solid left lower lobe pulmonary nodule. No follow-up needed if patient is low-risk. Non-contrast chest CT can be considered in 12 months if patient is high-risk. This recommendation follows the consensus statement:  Guidelines for Management of Incidental Pulmonary Nodules Detected on CT Images:From the Fleischner Society 2017; published online before print (10.1148/radiol.2017161659). 3. Three-vessel coronary atherosclerosis. 4. Dilated main pulmonary artery, suggesting pulmonary arterial hypertension.  Aortic Atherosclerosis (ICD10-I70.0).   Electronically Signed   By: Jason A Poff M.D.   On: 11/13/2017 16:46   has no past medical history on file.   reports that she quit smoking about 45 years ago. Her smoking use included cigarettes. She has a 60.00 pack-year smoking history. She has never used smokeless tobacco.  No past surgical history on file.  Allergies  Allergen Reactions  . Humira [Adalimumab] Hives    Immunization History  Administered Date(s) Administered  . Influenza, High Dose Seasonal PF 06/10/2017  . Pneumococcal Conjugate-13 12/09/2014    No family history on file.   Current Outpatient Medications:  .  aspirin (GOODSENSE ASPIRIN) 325 MG tablet, Take 325 mg by mouth., Disp: , Rfl:  .  atenolol (TENORMIN) 50 MG tablet, , Disp: , Rfl:  .  dextromethorphan-guaiFENesin (MUCINEX DM) 30-600 MG 12hr tablet, Take 1 tablet by mouth 2 (two) times daily., Disp: , Rfl:  .  doxylamine, Sleep, (UNISOM) 25 MG tablet, Take 25 mg by mouth at bedtime as needed., Disp: , Rfl:  .  rOPINIRole (REQUIP) 2 MG tablet, , Disp: , Rfl:  .  simvastatin (ZOCOR) 20 MG tablet, , Disp: , Rfl:  .  VENTOLIN HFA 108 (90 Base) MCG/ACT inhaler, INHALE 1 TO 2 PUFFS BY MOUTH FOUR TIMES A DAY AS NEEDED FOR CHEST CONGESTION/COUGH, Disp: , Rfl: 0 .  vitamin B-12 (CYANOCOBALAMIN) 1000 MCG tablet, Take 1,000 mcg by mouth daily., Disp: , Rfl:     Review of Systems     Objective:   Physical Exam Vitals:   12/29/17 1324  BP: 120/72  Pulse: 78  SpO2: 93%  Weight: 159 lb 12.8 oz (72.5 kg)  Height: 5' 5" (1.651 m)    Estimated body mass index is 26.59 kg/m as calculated from the following:    Height as of this encounter: 5' 5" (1.651 m).   Weight as of this encounter: 159 lb 12.8 oz (72.5 kg).  Focused exam shows she has crackles scattered throughout [place] lobe and bilateral bilateral lower lobe    Assessment:       ICD-10-CM   1. ILD (interstitial lung   disease) (HCC) J84.9   2. 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 History of prior cigarette smoking Z87.891   4. History of immunosuppression Z92.25    My concern is that she might have chronic hypersensitivity pneumonitis or NSIP.    Plan:     Second opinion thoracic radiology from Dr. Melinda Blietz At this point in time I would defer surgical lung biopsy given her age of 84 We will do bronchoscopy with lavage for cell count I explained to her the limitations of non-diagnosis and risks of hemothorax, pneumothorax and sedation complications and diagnostic uncertainty.  Risk and benefits.  Based on all that she has agreed to undergo the procedure which we will try to set up.   We will see her in 4 weeks from now.  In the interim we will do bronchoscopy with lavage.  Will address coronary artery calcification and follow-up.   > 50% of this > 25 min visit spent in face to face counseling or coordination of care    Dr. Ceclia Koker, M.D., F.C.C.P Pulmonary and Critical Care Medicine Staff Physician, [facility] Director - Interstitial Lung Disease  Program  [facility] at [facility] [place], [place], [address]  Pager: [phone], If no answer or between  15:00h - 7:00h: call [phone] Telephone: [phone]         

## 2017-12-29 NOTE — Patient Instructions (Addendum)
ICD-10-CM   1. ILD (interstitial lung disease) ([facility]) J84.9   2. 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 History of prior cigarette smoking Z87.891   4. History of immunosuppression Z92.25     Need to sort out what is driving the pulmonarhy fibrosis  Plan  - arrange for bronchoscopy with lavage at [facility]  - small scope, no TB risk,   - do 11/07/17 2pm Thursday at [facility] 4 weeks ILD clinic; address coronary artery calcifciation at followup

## 2017-12-29 NOTE — Telephone Encounter (Signed)
Baxter Flattery returned call and we were able to schedule bronch 5/16, 2pm at [facility].   Attempted to call pt to let her know bronch had been scheduled but unable to reach pt. Left message for pt to return call x1 so that way I could go over information regarding bronch with her.   While waiting for pt to return call, will send to MR so he will have as an [place]

## 2018-01-01 NOTE — Telephone Encounter (Signed)
Patient returned call, CB is [phone]

## 2018-01-01 NOTE — Telephone Encounter (Signed)
Called and spoke with pt letting her know the bronch was scheduled 5/16 at 2pm over at [facility].  Pt expressed understanding. Routing to MR to make him aware that pt has been placed on the schedule and is aware.

## 2018-01-06 NOTE — Telephone Encounter (Signed)
Pt states she was advised to expect a call from [facility] with further instructions for bronch on 01/07/18. Pt states [facility] has not reached out to her as of yet.  I have spoken to [place] with [facility], who stated that pt should receive a call before 4:30 this afternoon.  Pt has been made aware of this information and voiced her understanding. Nothing further is needed.

## 2018-01-06 NOTE — Telephone Encounter (Signed)
Pt has not been contacted by the hospital about her procedure tomorrow and wanted to know should she call them? CB [phone]

## 2018-01-07 ENCOUNTER — Ambulatory Visit (HOSPITAL_COMMUNITY)
Admission: RE | Admit: 2018-01-07 | Discharge: 2018-01-07 | Disposition: A | Discharge status: Home / Self Care | Payer: Medicare HMO | Source: Ambulatory Visit | Attending: Internal Medicine | Admitting: Internal Medicine

## 2018-01-07 ENCOUNTER — Encounter (HOSPITAL_COMMUNITY): Payer: Self-pay | Admitting: Respiratory Therapy

## 2018-01-07 ENCOUNTER — Encounter (HOSPITAL_COMMUNITY)
Admission: RE | Disposition: A | Discharge status: Home / Self Care | Payer: Self-pay | Source: Ambulatory Visit | Attending: Internal Medicine

## 2018-01-07 ENCOUNTER — Telehealth: Payer: Self-pay | Admitting: Internal Medicine

## 2018-01-07 DIAGNOSIS — L409 Psoriasis, unspecified: Secondary | ICD-10-CM | POA: Insufficient documentation

## 2018-01-07 DIAGNOSIS — I2510 Atherosclerotic heart disease of native coronary artery without angina pectoris: Secondary | ICD-10-CM | POA: Insufficient documentation

## 2018-01-07 DIAGNOSIS — I10 Essential (primary) hypertension: Secondary | ICD-10-CM | POA: Insufficient documentation

## 2018-01-07 DIAGNOSIS — K219 Gastro-esophageal reflux disease without esophagitis: Secondary | ICD-10-CM | POA: Diagnosis not present

## 2018-01-07 DIAGNOSIS — F17210 Nicotine dependence, cigarettes, uncomplicated: Secondary | ICD-10-CM | POA: Insufficient documentation

## 2018-01-07 DIAGNOSIS — J849 Interstitial pulmonary disease, unspecified: Secondary | ICD-10-CM | POA: Diagnosis present

## 2018-01-07 DIAGNOSIS — Z79899 Other long term (current) drug therapy: Secondary | ICD-10-CM | POA: Diagnosis not present

## 2018-01-07 DIAGNOSIS — M349 Systemic sclerosis, unspecified: Secondary | ICD-10-CM | POA: Insufficient documentation

## 2018-01-07 DIAGNOSIS — I700 Atherosclerosis of aorta: Secondary | ICD-10-CM | POA: Diagnosis not present

## 2018-01-07 DIAGNOSIS — J8410 Pulmonary fibrosis, unspecified: Secondary | ICD-10-CM | POA: Insufficient documentation

## 2018-01-07 DIAGNOSIS — Z7982 Long term (current) use of aspirin: Secondary | ICD-10-CM | POA: Insufficient documentation

## 2018-01-07 HISTORY — PX: VIDEO BRONCHOSCOPY: SHX5072

## 2018-01-07 LAB — BODY FLUID CELL COUNT WITH DIFFERENTIAL
Eos, Fluid: 4 %
LYMPHS FL: 6 %
MONOCYTE-MACROPHAGE-SEROUS FLUID: 35 % — AB (ref 50–90)
NEUTROPHIL FLUID: 55 % — AB (ref 0–25)
Total Nucleated Cell Count, Fluid: 73 cu mm (ref 0–1000)

## 2018-01-07 SURGERY — VIDEO BRONCHOSCOPY WITHOUT FLUORO
Anesthesia: Moderate Sedation | Laterality: Bilateral

## 2018-01-07 MED ORDER — MIDAZOLAM HCL 5 MG/ML IJ SOLN
INTRAMUSCULAR | Status: AC
  Filled 2018-01-07: qty 2

## 2018-01-07 MED ORDER — MIDAZOLAM HCL 10 MG/2ML IJ SOLN
INTRAMUSCULAR | Status: DC | PRN
  Administered 2018-01-07: 2 mg via INTRAVENOUS

## 2018-01-07 MED ORDER — BUTAMBEN-TETRACAINE-BENZOCAINE 2-2-14 % EX AERO: 1.0000 | INHALATION_SPRAY | Freq: Once | CUTANEOUS | Status: DC

## 2018-01-07 MED ORDER — LIDOCAINE HCL 1 % IJ SOLN
INTRAMUSCULAR | Status: DC | PRN
  Administered 2018-01-07: 6 mL via RESPIRATORY_TRACT

## 2018-01-07 MED ORDER — PHENYLEPHRINE HCL 0.25 % NA SOLN: 1.0000 | Freq: Four times a day (QID) | NASAL | Status: DC | PRN

## 2018-01-07 MED ORDER — LIDOCAINE HCL 2 % EX GEL
1.0000 "application " | Freq: Once | CUTANEOUS | Status: DC
  Filled 2018-01-07: qty 5

## 2018-01-07 MED ORDER — FENTANYL CITRATE (PF) 100 MCG/2ML IJ SOLN
INTRAMUSCULAR | Status: AC
  Filled 2018-01-07: qty 4

## 2018-01-07 MED ORDER — FENTANYL CITRATE (PF) 100 MCG/2ML IJ SOLN
INTRAMUSCULAR | Status: DC | PRN
  Administered 2018-01-07: 25 ug via INTRAVENOUS

## 2018-01-07 MED ORDER — SODIUM CHLORIDE 0.9 % IV SOLN
INTRAVENOUS | Status: DC
  Administered 2018-01-07: 14:00:00 via INTRAVENOUS

## 2018-01-07 MED ORDER — LIDOCAINE HCL URETHRAL/MUCOSAL 2 % EX GEL
CUTANEOUS | Status: DC | PRN
  Administered 2018-01-07: 1

## 2018-01-07 MED ORDER — PHENYLEPHRINE HCL 0.25 % NA SOLN
NASAL | Status: DC | PRN
  Administered 2018-01-07: 2 via NASAL

## 2018-01-07 NOTE — Progress Notes (Signed)
Video Bronchoscopy done Interventional Bronchial washing Procedure tolerated well

## 2018-01-07 NOTE — Telephone Encounter (Signed)
Results for Ashlee Martin, Ashlee Martin (MRN [id number]) as of 01/07/2018 20:04  Ref. Range 01/07/2018 14:35  Color, Fluid Latest Ref Range: YELLOW  STRAW (A)  WBC, Fluid Latest Ref Range: 0 - 1,000 cu mm 73  Lymphs, Fluid Latest Units: % 6  Eos, Fluid Latest Units: % 4  Appearance, Fluid Latest Ref Range: CLEAR  CLOUDY (A)  Other Cells, Fluid Latest Units: % CORRELATE WITH CY...  Neutrophil Count, Fluid Latest Ref Range: 0 - 25 % 55 (H)  Monocyte-Macrophage-Serous Fluid Latest Ref Range: 50 - 90 % 35 (L)    Low lymphs Has POlys   Features Not c/w HP but more c/w UIP  Will discuss at followup 02/02/18   Dr. Brand Males, M.D., [facility].C.P Pulmonary and Critical Care Medicine Staff Physician, [place] Director - Interstitial Lung Disease  Program  Pulmonary [place] at [place], [place], [address]  Pager: [phone], If no answer or between  15:00h - 7:00h: call [phone] Telephone: [phone]

## 2018-01-07 NOTE — Discharge Instructions (Signed)
Flexible Bronchoscopy, Care After These instructions give you information on caring for yourself after your procedure. Your doctor may also give you more specific instructions. Call your doctor if you have any problems or questions after your procedure. Follow these instructions at home:  Do not eat or drink anything for 2 hours after your procedure. If you try to eat or drink before the medicine wears off, food or drink could go into your lungs. You could also burn yourself.  After 2 hours have passed and when you can cough and gag normally, you may eat soft food and drink liquids slowly.  The day after the test, you may eat your normal diet.  You may do your normal activities.  Keep all doctor visits. Get help right away if:  You get more and more short of breath.  You get light-headed.  You feel like you are going to pass out (faint).  You have chest pain.  You have new problems that worry you.  You cough up more than a little blood.  You cough up more blood than before. This information is not intended to replace advice given to you by your health care provider. Make sure you discuss any questions you have with your health care provider. Document Released: 06/08/2009 Document Revised: 01/17/2016 Document Reviewed: 04/15/2013 Elsevier Interactive Patient Education  2017 [place].  Nothing to eat or drink until   6:30  pm today 01/07/2018. Call the office with any question or concerns at [phone]  Followup Future Appointments  Date Time Provider [place]  02/02/2018  3:30 PM Brand Males, MD [facility] None

## 2018-01-07 NOTE — Op Note (Signed)
Name:  Ashlee Martin MRN:  [id number] DOB:  1930-12-11  PROCEDURE NOTE  Procedure(s): Flexible bronchoscopy [phone]) Bronchial alveolar lavage [phone]) of the right middle lobe   Indications:  Interstitial lung diseaese wth suspicion of chronic hypersensitivity pneumonitis  Consent:  Procedure, benefits, risks and alternatives discussed.  Questions answered.  Consent obtained.  Anesthesia:  Moderate Sedation  Location: [place] bronch suite  Procedure summary:  Appropriate equipment was assembled.  The patient was brought to the procedure suite room and identified as Ashlee Martin with 04-10-1931  Safety timeout was performed. The patient was placed supine on the  table, airway and moderate sedation administered by this operator  After the appropriate level of moderation was assured, flexible video bronchoscope was lubricated and inserted through the endotracheal tube.  Total of 14 mL of 1% Lidocaine were administered through the bronchoscope to augment moderate sedation. Fentanyl 49mg and versed 284mgiven  Airway examination was yes performed bilaterally to subsegmental level.  Minimal clear secretions were noted, mucosa appeared normal and no endobronchial lesions were identified.  Bronchial alveolar lavage of the right middle lobe was performed with 130 mL of normal saline in 2042mhat was cleared out and discarded then 6m70md 6mL20m then 30mL 25muots.  Total return of 64 mL of fluid thin, clear, normal looking fluid (mild pink on last one), after which the bronchoscope was withdrawn.   The patient was recovered and then  transferred to recovery area  Post-procedure chest x-ray was not ordered/not needed  Specimens sent: Bronchial alveolar lavage specimen of the right middle lobe for cell count (including CD4/CD8 assessment), microbiology and cytology.  Complications:  No immediate complications were noted.  Hemodynamic parameters and oxygenation remained stable throughout  the procedure.  Estimated blood loss:  none  IMPRESSION 1. Normal airways in ILD patient 2. S/p BAL RML - results pending  Followup Future Appointments  Date Time Provider DepartSuperior/2019  3:30 PM RamaswBrand MalesBPU-PULCARE None     Dr. MuraliBrand Males, F.C.C.[facility] and Critical Care Medicine Staff Physician [facility] Pager: [phone] answer or between  15:00h - 7:00h: call [phone]  01/07/2018 2:38 PM

## 2018-01-07 NOTE — Interval H&P Note (Signed)
Last seen 12/29/17. Prsents for diagnostic bronch with BAL Risks of pneumothorax, hemothorax, sedation/anesthesia complications such as cardiac or respiratory arrest or hypotension, stroke and bleeding all explained. Benefits of diagnosis but limitations of non-diagnosis also explained. Patient verbalized understanding and wished to proceed.   In interim: had psoriasis flare now resolved. Feels resp status better after cutting down chicken coop expioresuire  Objective Crack;es on exam Vitals stable  Overall no change  A/P ILD ? Chronic HP  P Risks of pneumothorax, hemothorax, sedation/anesthesia complications such as cardiac or respiratory arrest or hypotension, stroke and bleeding all explained. Benefits of diagnosis but limitations of non-diagnosis also explained. Patient verbalized understanding and wished to proceed.    Dr. Brand Males, M.D., [facility].C.P Pulmonary and Critical Care Medicine Staff Physician, [place] Director - Interstitial Lung Disease  Program  Pulmonary [place] at [place], [place], [address]  Pager: [phone], If no answer or between  15:00h - 7:00h: call [phone] Telephone: [phone]

## 2018-01-08 LAB — ACID FAST SMEAR (AFB): ACID FAST SMEAR - AFSCU2: NEGATIVE

## 2018-01-08 LAB — PNEUMOCYSTIS JIROVECI SMEAR BY DFA: PNEUMOCYSTIS JIROVECI AG: NEGATIVE

## 2018-01-10 ENCOUNTER — Encounter (HOSPITAL_COMMUNITY): Payer: Self-pay | Admitting: Internal Medicine

## 2018-01-10 LAB — CULTURE, BAL-QUANTITATIVE: CULTURE: NORMAL — AB

## 2018-01-10 LAB — CULTURE, BAL-QUANTITATIVE W GRAM STAIN

## 2018-01-12 LAB — MTB NAA WITHOUT AFB CULTURE: M Tuberculosis, Naa: NEGATIVE

## 2018-01-14 NOTE — Telephone Encounter (Signed)
Called and spoke with pt letting her know the results of the bronchoscopy. Stated to pt MR will go over bronch results in full detail at next OV.  Pt expressed understanding. Nothing further needed at this time.

## 2018-01-25 DIAGNOSIS — J8410 Pulmonary fibrosis, unspecified: Secondary | ICD-10-CM | POA: Diagnosis not present

## 2018-01-25 DIAGNOSIS — E785 Hyperlipidemia, unspecified: Secondary | ICD-10-CM | POA: Diagnosis not present

## 2018-01-25 DIAGNOSIS — G2581 Restless legs syndrome: Secondary | ICD-10-CM | POA: Diagnosis not present

## 2018-01-25 DIAGNOSIS — E663 Overweight: Secondary | ICD-10-CM | POA: Diagnosis not present

## 2018-01-25 DIAGNOSIS — H04129 Dry eye syndrome of unspecified lacrimal gland: Secondary | ICD-10-CM | POA: Diagnosis not present

## 2018-01-25 DIAGNOSIS — K219 Gastro-esophageal reflux disease without esophagitis: Secondary | ICD-10-CM | POA: Diagnosis not present

## 2018-01-25 DIAGNOSIS — I10 Essential (primary) hypertension: Secondary | ICD-10-CM | POA: Diagnosis not present

## 2018-01-25 DIAGNOSIS — L409 Psoriasis, unspecified: Secondary | ICD-10-CM | POA: Diagnosis not present

## 2018-01-25 DIAGNOSIS — G4700 Insomnia, unspecified: Secondary | ICD-10-CM | POA: Diagnosis not present

## 2018-01-27 DIAGNOSIS — Z299 Encounter for prophylactic measures, unspecified: Secondary | ICD-10-CM | POA: Diagnosis not present

## 2018-01-27 DIAGNOSIS — Z6825 Body mass index (BMI) 25.0-25.9, adult: Secondary | ICD-10-CM | POA: Diagnosis not present

## 2018-01-27 DIAGNOSIS — R195 Other fecal abnormalities: Secondary | ICD-10-CM | POA: Diagnosis not present

## 2018-01-27 DIAGNOSIS — M779 Enthesopathy, unspecified: Secondary | ICD-10-CM | POA: Diagnosis not present

## 2018-01-27 DIAGNOSIS — E785 Hyperlipidemia, unspecified: Secondary | ICD-10-CM | POA: Diagnosis not present

## 2018-01-27 DIAGNOSIS — I10 Essential (primary) hypertension: Secondary | ICD-10-CM | POA: Diagnosis not present

## 2018-02-02 ENCOUNTER — Ambulatory Visit (INDEPENDENT_AMBULATORY_CARE_PROVIDER_SITE_OTHER): Payer: Medicare HMO | Admitting: Internal Medicine

## 2018-02-02 ENCOUNTER — Telehealth: Payer: Self-pay | Admitting: Internal Medicine

## 2018-02-02 ENCOUNTER — Encounter: Payer: Self-pay | Admitting: Internal Medicine

## 2018-02-02 VITALS — BP 108/72 | HR 83 | Ht 65.0 in | Wt 153.6 lb

## 2018-02-02 DIAGNOSIS — J849 Interstitial pulmonary disease, unspecified: Secondary | ICD-10-CM

## 2018-02-02 DIAGNOSIS — J84112 Idiopathic pulmonary fibrosis: Secondary | ICD-10-CM | POA: Diagnosis not present

## 2018-02-02 NOTE — Patient Instructions (Addendum)
ICD-10-CM   1. IPF (idiopathic pulmonary fibrosis) ([place]) J84.112   2. ILD (interstitial lung disease) (HCC) J84.9      Diagnosis is IPF - generally a progressive disease   Plan -start esbriet  - continue prilosec - monitor for nausea - apply sunscreen at all times - wil ldiscuss travel to [place] of 6000 [place] in October 2019   followup 4- 6 weeks with Dr Chase Caller in ILD clinic or regular clinic or APP to monitor progress Refer to rehab

## 2018-02-02 NOTE — Telephone Encounter (Signed)
Order placed. Nothing further needed. 

## 2018-02-02 NOTE — Telephone Encounter (Signed)
Raquel Sarna   Forgot to mention. Please refer Nisreen Guise from [place] [phone] top pulm rehab at [facility] for dx of IPF  Thanks  Dr. Brand Males, M.D., [facility].C.P Pulmonary and Critical Care Medicine Staff Physician, [place] Director - Interstitial Lung Disease  Program  Pulmonary [place] at [place], [place], [address]  Pager: [phone], If no answer or between  15:00h - 7:00h: call [phone] Telephone: [phone]

## 2018-02-02 NOTE — Progress Notes (Signed)
Subjective:     Patient ID: Ashlee Martin, female   DOB: [birth year], 84 y.o.   MRN: [id number]  HPI   PCP Glenda Chroman, MD  HPI  IOV 11/03/2017  Chief Complaint  Patient presents with  . Consult    Self referral. Pt states she was diagnosed with pulmonary fibrosis x4 weeks ago. States she has c/o cough with green mucus, mild SOB. Denies any CP.    84 year old female self-referred for evaluation of pulmonary fibrosis.  She is fairly good historian.  She tells me that she has diagnosis of psoriasis of the skin in 2016 and was under the care of Dr. Channing Mutters in [place].  She was on Humira for 11 months taking monthly injections but her rash got bad in November 2017 and then she was treated with prednisone.  She went on vacation to [place] and then returned and then had a diagnosis of pneumonia.  She was hospitalized for this at [facility] in January 2018.  She was on oxygen and hospitalized for 3 or 4 days and then discharged.  After that she is always had some amount of residual cough and mild shortness of breath but starting approximately 6 months ago the cough and shortness of breath was more perceptible.  However in the last 2 months a significant acceleration in the severity of cough and shortness of breath.  The cough is described as waking up at night and associated with phlegm.  She also gets short of breath walking or hurrying on level ground or walking up a slight hill.  Her primary care physician did a CT chest with contrast in February 2019.  I do not have the image with me but I reviewed the report and he reports pulmonary fibrosis not otherwise specified.  She did end up in the emergency room on October 31, 2017 and is being discharged with [facility] and redness on taper which is helping.   American College of chest physicians interstitial lung disease questionnaire  Past medical history: Positive for seizures, history of pneumonia in January 2018, acid reflux disease, dry eyes, dry mouth,  photophobia and arthralgia.  She has diagnosis of skin psoriasis.  This is not much of an issue at this point in time  Personal exposure history: She started smoking cigarettes at 84 years of age and smoked 3-4 packs a day and quit in the mid 1980s.  She reports having had use street drugs.  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  She only occasionally visits the coop.  She opens the door and looks that she can out.  Otherwise no exposure to water damage or mold a humidifier [place] or hot tub or Jacuzzi or birds or feathered pillows.  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  Then she retired and moved to [place] in [place] where she lived in normal homes for 5 years up until 8 years ago and then she and her husband moved to [place], [place] where they live in a farm.  Pulmonary toxicity history: Other than exposure to Humira and short-term prednisone no other pulmonary toxicity history including nitrofurantoin or BCG or amiodarone or chemotherapy    Walking desaturation test on 11/03/2017 185 feet x 3 laps on ROOM AIR:  did walk at slow pace and got mildly dyspneic. Did desaturate to 89%. Rest pulse ox was 98%, final  pulse ox was 89%. HR response 67/min at rest to 91/min at peak exertion. [name]  Did not Desaturate < 88% . Ashlee Martin did yes  Desaturated </= 3% points. Ashlee Martin yes did get tachyardic    12/08/2017 Acute OV :Sinus congestion .  Patient presents for an acute office visit.  Patient complains of sinus congestion , ear fullness and popping , sinus pressure , drippy nose, watery eyes, very little cough .  Has had this for last few months , Worse for the last 2 weeks .  Seen by PCP last week started on allergy pill and nasal spray , she does not know what they were.  Says she has chronic allergy problems , worse in Spring and Fall.   She denies any discolored mucus, sinus pain, teeth pain, fever, nausea vomiting diarrhea, or chest pain.    Patient was seen for pulmonary consult March 2019 to establish for pulmonary fibrosis.  She has underlying psoriasis.  Patient was set up for a CT chest that showed fibrotic interstitial lung disease with mild honeycombing.  Autoimmune panel was essentially negative except for SS B IgG antibody.  And sed rate was mildly elevated at 34. Patient has an upcoming PFT and follow-up visit with ILD clinic in 2 weeks. She was having a severe cough last visit.  She was given a prednisone taper.  Patient says she did require one other prednisone taper but her cough is almost totally resolved.  She has a little mild cough in the morning but very mild.   OV 12/29/2017  Chief Complaint  Patient presents with  . Follow-up    Pt states she has had both good days and bad days. States she has had some problems with allergies.    Ashlee Martin presns for followup for ILD workup.it is documented below.  Pulmonary function test in terms of severity shows moderate restriction with significant reduction in diffusion capacity.  Her high-resolution CT scan of the chest is read as probable UIP but I wonder if that is mixed emphysema although she quit smoking many decades ago and indeterminate pattern for UIP.  There might be air trapping but this is not reported.  I again went over her exposures and she confirms and her husband who is here with her today that there is significant chicken coop exposure ongoing currently.  There are no new interim issues other than the fact she had a respiratory exacerbation which required prednisone which seemed to help her symptoms considerably.  Currently she is off prednisone.  Her autoimmune vasculitis and hypersensitivity pneumonitis panel was essentially negative except for trace autoimmune positivity.     Results for Ashlee, Martin (MRN [id number]) as of 12/29/2017 13:37  Ref.  Range 12/29/2017 12:43  FVC-Pre Latest Units: L 1.49  FVC-%Pred-Pre Latest Units: % 58  FEV1-Pre Latest Units: L 1.18  FEV1-%Pred-Pre Latest Units: % 62  Pre FEV1/FVC ratio Latest Units: % 79  FEV1FVC-%Pred-Pre Latest Units: % 108  Results for Ashlee Martin, Ashlee Martin (MRN [id number]) as of 12/29/2017 13:37  Ref. Range 12/29/2017 12:43  DLCO unc Latest Units: ml/min/mmHg 7.82  DLCO unc % pred Latest Units: % 30   Results for Ashlee Martin, Ashlee Martin (MRN [id number]) as of 12/29/2017 13:37  Ref. Range 11/03/2017 10:08  ASPERGILLUS FUMIGATUS Latest Ref Range: NEGATIVE  NEGATIVE  [place] Serum Latest Ref Range: NEGATIVE  NEGATIVE  Anit Nuclear Antibody(ANA) Latest Ref Range: NEGATIVE  NEGATIVE  Angiotensin-Converting Enzyme Latest Ref Range: 9 - 67 U/L 28  Cyclic Citrullin Peptide Ab Latest Units: UNITS <16  ds DNA Ab Latest Units: IU/mL <1  ENA RNP Ab Latest Ref Range: 0.0 - 0.9 AI <0.2  Myeloperoxidase Abs Latest Units: AI <1.0  Serine Protease 3 Latest Units: AI <1.0  RA Latex Turbid. Latest Ref Range: <14 IU/mL <14  SSA (Ro) (ENA) Antibody, IgG Latest Ref Range: <1.0 NEG AI <1.0 NEG  SSB (La) (ENA) Antibody, IgG Latest Ref Range: <1.0 NEG AI 1.8 POS (A)  Scleroderma (Scl-70) (ENA) Antibody, IgG Latest Ref Range: <1.0 NEG AI <1.0 NEG    IMPRESSION: 1. Spectrum of findings compatible with fibrotic interstitial lung disease with mild honeycombing. Despite the absence of a clear basilar gradient, these findings are considered to represent probable usual interstitial pneumonia (UIP). Fibrotic phase nonspecific interstitial pneumonia (NSIP) is on the differential. Follow-up high-resolution chest CT in 6-12 months would be useful to assess temporal pattern stability, as clinically warranted. 2. Tiny 3 mm solid left lower lobe pulmonary nodule. No follow-up needed if patient is low-risk. Non-contrast chest CT can be considered in 12 months if patient is high-risk. This recommendation follows the consensus statement:  Guidelines for Management of Incidental Pulmonary Nodules Detected on CT Images:From the Fleischner Society 2017; published online before print (10.1148/radiol.[id number]). 3. Three-vessel coronary atherosclerosis. 4. Dilated main pulmonary artery, suggesting pulmonary arterial hypertension.  Aortic Atherosclerosis (ICD10-I70.0).   Electronically Signed   By: Ilona Sorrel M.D.   On: 11/13/2017 16:46   OV 02/02/2018  Chief Complaint  Patient presents with  . Follow-up    Pt states things have been up and down since last visit and since bronch was performed. Pt still has problems with upset stomach and pt is still having problems with her breathing. Pt does cough in the morning with occ. green mucus. Denies any CP/chest tightness.    Follow-up interstitial lung disease.  This follow-up is after bronchoscopy with lavage which was done Jan 07, 2018.  This shows a slight preponderance of polymorphs and absence of lymphocytes thus suggesting this is not hypersensitivity pneumonitis but with the increase in polymorphs the need to shift towards UIP.  I took a second opinion on radiology from Dr. Lorin Picket and she feels a CT scan is more consistent with UIP.  In the interim patient has no new complaints.  Walking desaturation test indicates a 7% pulse ox drop with tachycardia and shortness of breath but still does not drop below 88%.  Of note she does indicate that she has had chronic intermittent stress related nausea that has been worked up extensively but without any cause.   Results for Ashlee Martin, Ashlee Martin (MRN [id number]) as of 01/07/2018 20:04  Ref. Range 01/07/2018 14:35  Color, Fluid Latest Ref Range: YELLOW  STRAW (A)  WBC, Fluid Latest Ref Range: 0 - 1,000 cu mm 73  Lymphs, Fluid Latest Units: % 6  Eos, Fluid Latest Units: % 4  Appearance, Fluid Latest Ref Range: CLEAR  CLOUDY (A)  Other Cells, Fluid Latest Units: % CORRELATE WITH CY...  Neutrophil Count, Fluid Latest Ref Range: 0  - 25 % 55 (H)  Monocyte-Macrophage-Serous Fluid Latest Ref Range: 50 - 90 % 35 (L)      Simple office walk 185 feet x  3 laps goal with forehead probe 02/02/2018 Start esbriet   O2 used Room air   Number laps completed 3   Comments about pace nrmal pacte   Resting Pulse Ox/HR 98% and 83/min   Final Pulse Ox/HR 91% and 98/min  Desaturated </= 88% yes   Desaturated <= 3% points yes   Got Tachycardic >/= 90/min yes   Symptoms at end of test dyspnea   Miscellaneous comments Normal pace       has no past medical history on file.   reports that she quit smoking about 45 years ago. Her smoking use included cigarettes. She has a 60.00 pack-year smoking history. She has never used smokeless tobacco.  Past Surgical History:  Procedure Laterality Date  . VIDEO BRONCHOSCOPY Bilateral 01/07/2018   Procedure: VIDEO BRONCHOSCOPY WITHOUT FLUORO;  Surgeon: Brand Males, MD;  Location: [facility];  Service: Cardiopulmonary;  Laterality: Bilateral;    Allergies  Allergen Reactions  . Humira [Adalimumab] Other (See Comments)    Worsened your psoriasis.    Immunization History  Administered Date(s) Administered  . Influenza, High Dose Seasonal PF 06/10/2017  . Pneumococcal Conjugate-13 12/09/2014    No family history on file.   Current Outpatient Medications:  .  aspirin (GOODSENSE ASPIRIN) 325 MG tablet, Take 325 mg by mouth at bedtime. , Disp: , Rfl:  .  atenolol (TENORMIN) 50 MG tablet, Take 25 mg by mouth 2 (two) times daily. , Disp: , Rfl:  .  dextromethorphan-guaiFENesin (MUCINEX DM) 30-600 MG 12hr tablet, Take 1 tablet by mouth 2 (two) times daily., Disp: , Rfl:  .  doxylamine, Sleep, (UNISOM) 25 MG tablet, Take 25 mg by mouth at bedtime as needed for sleep. , Disp: , Rfl:  .  omeprazole (PRILOSEC OTC) 20 MG tablet, Take 20 mg by mouth at bedtime., Disp: , Rfl:  .  Polyethyl Glycol-Propyl Glycol (SYSTANE OP), Place 1 drop into both eyes daily., Disp: , Rfl:  .  rOPINIRole  (REQUIP) 2 MG tablet, Take 2 mg by mouth at bedtime as needed (restless legs). , Disp: , Rfl:  .  simvastatin (ZOCOR) 20 MG tablet, Take 20 mg by mouth daily. , Disp: , Rfl:  .  vitamin B-12 (CYANOCOBALAMIN) 1000 MCG tablet, Take 1,000 mcg by mouth at bedtime. , Disp: , Rfl:  .  VENTOLIN HFA 108 (90 Base) MCG/ACT inhaler, INHALE 1 TO 2 PUFFS BY MOUTH FOUR TIMES A DAY AS NEEDED FOR CHEST CONGESTION/COUGH, Disp: , Rfl: 0   Review of Systems     Objective:   Physical Exam Today's Vitals   02/02/18 1522  BP: 108/72  Pulse: 83  SpO2: 98%  Weight: 153 lb 9.6 oz (69.7 kg)  Height: _0  (1.651 m)    Estimated body mass index is 25.56 kg/m as calculated from the following:   Height as of this encounter: _1  (1.651 m).   Weight as of this encounter: 153 lb 9.6 oz (69.7 kg).     Assessment:       ICD-10-CM   1. IPF (idiopathic pulmonary fibrosis) ([place]) J84.112   2. ILD (interstitial lung disease) ([facility]) J84.9     Re IPF - Course   - progressive disease in almost all patients (> 90%); typically few to several year progression   - super unlukcy 10% - progress to death in a year   - super lucky < 10% - stability > 5 years or even rarely 10 years   -unpredictable in each individual - Rx:  anti-fibrotics + since 2014 but they can only slow disease down; preventative. Not Rx symptoms  - they have side effects including intolerance is < 1/3rd patients  - most 55-65% patients tolerate them well  - further details below - Anti-fibrotic Drugs  Both drugs OFEV and Esbriet only slow down progression, 1 out of 6 patients  - this means extension in quality of life but no difference in symptoms  - no study directly compares the 2 drugs but efficacy roughly equal at 1 year time point  Other pillars of management  - Symptoms - cough and dyspnea RX  - O2 - definitely helps symptoms  - Rehab - definitely helps symptoms and conditioning - wil lrefer to rehab  - Transplant - age < 3, good  social support, BMI < 26 and $15,000 in cash  - Pulmonary Trials: highly encourage participation through [employer]  - Patient Support Group    - http://richmond.com/ \   - https://smith-davis.net/    - OFEV  - - time to first exacerbation possibly reduced in one trial but not in another - twice daily, no titration, potentially more convenient dosing  - no need for sunscreen  - high chance of mild diarrhea but low chance of significant diarrhea needing to stop medication.   - Rx diarrhea with lomotil - slight increase in heart attack risk and theoretical increase in bleeding risk,   - need monthly blood work for 3 months and then every 6 months - monitor liver function   - ESBRIET  - 3 pill three times daily, slow titration.  - Need to wean sunscreen  - Some chance of nausea and anorexia with small chance for diarrhea  - no known heart attack risk - no known bleeding risk,   - need monthly blood work for 6 months - monitor liver function - possible mortality benefit in pooled analysis  - larger world wide experience      Plan:      Diagnosis is IPF - generally a progressive disease. Discussed all of the above points   Plan -start esbriet (based o - continue prilosec - monitor for nausea - at thigh risk - apply sunscreen at all times - wil ldiscuss travel to [place] elevation of 6000 feet in October 2019 -   followup 4- 6 weeks with Dr Chase Caller in ILD clinic or regular clinic or APP to monitor  Discuss more on research and suppor grup at followu   > 50% of this > 25 min visit spent in face to face counseling or coordination of care    Dr. Brand Males, M.D., [facility].C.P Pulmonary and Critical Care Medicine Staff Physician, [place] Director - Interstitial Lung Disease  Program  Pulmonary [place] at [place], [place], [address]  Pager: [phone], If no answer or between  15:00h - 7:00h: call [phone] Telephone: [phone]

## 2018-02-05 ENCOUNTER — Telehealth: Payer: Self-pay | Admitting: Internal Medicine

## 2018-02-05 NOTE — Telephone Encounter (Signed)
PA initiated via www.covermymeds.com  Key: [id number] - PA Case ID: [id number] - Rx #: [id number]  It takes 1-7 business days to declare decision on PA approval.  Routing to [place] to follow up with pt and PA.

## 2018-02-05 NOTE — Telephone Encounter (Signed)
Just saw on Cover My Meds that pt's med was denied.  While looking at cover my meds for the reason why med was denied, it was due to it saying pt has not had a HRCT.  Pt has had an HRCT, a bronchoscopy, PFT to all support pt's diagnosis.  [facility] and spoke with Terri regarding the above stated and I stated to her the pt has had an HRCT.  Terri transferred me to [place], pharmacy tech and I stated to her the information above and she stated to me at this point, an appeal will now need to be done.    Was transferred to the appeals department and spoke with Sunday Spillers and started an appeal but am also going to submit supportive documentation to the grievance and appeals department for them to view.  Per Sunday Spillers, it could take at least 72 hours for them to review the appeal.

## 2018-02-08 NOTE — Telephone Encounter (Signed)
Denial letter has been received and placed in MR's cubby.

## 2018-02-08 NOTE — Telephone Encounter (Signed)
Sent letter from MR as well as sending all supporting documentation again to Grievance and Appeals.  Will wait for a response.

## 2018-02-08 NOTE — Telephone Encounter (Signed)
To whom It May concern   Island Dohmen 13-Dec-1930 of [address] [place] [address] has IPF. The diagnosis of IPF is based on fact she is aage > 21 (age 84), probable UIP on CT scan with honeycombing early, negative autoimmune and vasculitis serology and bronchoscopy 01/07/18 with 55% neutrophils on lavage. This diagnosis is IPF unless proven otherwise. Biopsy is risky in this 84 year old and also not required. We are a Pulmonary Fibrosis Foundation recognized IPF care center. We base our diagnosis after careful thought and analysis.   Sincerely yours  Dr. Brand Males, M.D., [facility].C.P Pulmonary and Critical Care Medicine Staff Physician, [place] Director - Interstitial Lung Disease  Program  Pulmonary [place] at [place], [place], [address]  Pager: [phone], If no answer or between  15:00h - 7:00h: call [phone] Telephone: [phone]

## 2018-02-08 NOTE — Telephone Encounter (Signed)
Supporting documentation was sent to the appeals department including pt's HRCT, PFT, bronchoscopy, and OV Friday, 6/14.  At this point, we need to send a letter to appeal this.  Dr. Chase Caller, please advise what we need to say in the letter to help get pt's Esbriet approved.  Thanks!

## 2018-02-08 NOTE — Telephone Encounter (Signed)
Raquel Sarna please advise if supporting documents were faxed on Friday, or if this needs to be done.  Thanks.

## 2018-02-09 ENCOUNTER — Telehealth: Payer: Self-pay | Admitting: Internal Medicine

## 2018-02-09 NOTE — Telephone Encounter (Signed)
Called and spoke with pt regarding the Esbriet med and stated to her we have started an appeal for the med to get med approved by her insurance.  Pt expressed understanding. Nothing further needed at this time. Stated to pt once we know if med has been approved, we would contact her and let her know.

## 2018-02-12 NOTE — Telephone Encounter (Signed)
Received a fax from [employer] and appeals for a redetermination of pt's med.   Based on the appeal that was done, pt's Esbriet has been approved 02/10/18-02/11/2020.  Attempted to contact pt to let her know this had been done but unable to reach her.  Left a message for pt to return call x1

## 2018-02-12 NOTE — Telephone Encounter (Signed)
Pt returned my call and I stated to her that the med had been approved after the appeal was done so hopefully soon the specialty pharmacy will contact her to schedule the shipment of pt's med.  Pt expressed understanding. Nothing needed at this time.

## 2018-02-16 ENCOUNTER — Telehealth: Payer: Self-pay | Admitting: Internal Medicine

## 2018-02-16 MED ORDER — PREDNISONE 10 MG PO TABS: ORAL_TABLET | ORAL | 0 refills | Status: DC

## 2018-02-16 MED ORDER — ONDANSETRON HCL 4 MG PO TABS: 4.0000 mg | ORAL_TABLET | Freq: Three times a day (TID) | ORAL | 0 refills | Status: DC

## 2018-02-16 NOTE — Telephone Encounter (Signed)
Spoke with pt, she states she is not on Esbriet but she did receive the letter that she was accepted for financial assistance. Raquel Sarna what is the next step, do they send medication to the patient or is there something else we need to do. Pt is aware.   I advised her about MR's recommendations and [facility] [place]

## 2018-02-16 NOTE — Telephone Encounter (Signed)
Is she not on esbriet yet?  REcommend Take prednisone 40 mg daily x 2 days, then 44m daily x 2 days, then 141mdaily x 2 days, then 15m79maily x 2 days and stop  To address the cough  zofran 4mg32mree times daily as needex 3 days for nausea  If worse go to er

## 2018-02-16 NOTE — Telephone Encounter (Signed)
The specialty pharmacy should contact pt to schedule a shipment of the [place].  [place] since they were the pharmacy that I sent pt's enrollment to so I could check on status of pt's Esbriet to get a status on when med will be shipped to pt's address.  Spoke with Thayer Jew who stated pt's Esbriet enrollment application was transferred to [facility] on 6/12 for pt to get med from that pharmacy.  [place] and spoke with Olivia Mackie who states it looked like a PA was needed.  Stated to [place] that a PA was done and an appeal for med has also been done and documentation was received that pt had been approved for med. Stated to her both pt and provider have received approval letter.  Per Olivia Mackie, pt's application is running through. Olivia Mackie stated pt does have a very high copay which is $2,179.03 but they will contact pt to see if any grants can help with financial assistance.

## 2018-02-16 NOTE — Telephone Encounter (Signed)
Spoke with patient. She stated that she has been having non-productive coughing spells since last Sunday evening. These spells are causing her to feel nauseated. She denies starting any new medications. Also denies any SOB, chest pain or fever.   She wishes to have something called in for her.   [facility] [place] MR, please advise. Thanks!

## 2018-02-18 LAB — ACID FAST CULTURE WITH REFLEXED SENSITIVITIES (MYCOBACTERIA): Acid Fast Culture: NEGATIVE

## 2018-02-19 ENCOUNTER — Telehealth: Payer: Self-pay | Admitting: Internal Medicine

## 2018-02-19 NOTE — Telephone Encounter (Signed)
Message from Emily:she was supposed to be receiving a phone call from specialty pharmacy regarding scheduling the shipment of [place] and that is probably what it was about. Appeal that was done on the [place] was approved   Pt is aware and states she got a letter of approval recently and will await a call from her pharmacy. Nothing more needed at this time.

## 2018-02-23 ENCOUNTER — Telehealth: Payer: Self-pay | Admitting: Internal Medicine

## 2018-02-23 NOTE — Telephone Encounter (Signed)
Attempted to call patient today regarding prior message. I did not receive an answer at time of call. I have left a voicemail message for pt to return call. X1

## 2018-02-24 NOTE — Telephone Encounter (Signed)
lmtcb x2 for pt. 

## 2018-02-26 NOTE — Telephone Encounter (Signed)
Spoke with Raquel Sarna, she stated that she has not spoken to the patient recently.   LMTCB x3

## 2018-03-01 NOTE — Telephone Encounter (Signed)
lmtcb X4 for pt.  Will close encounter per triage protocol.

## 2018-03-02 ENCOUNTER — Telehealth: Payer: Self-pay | Admitting: Internal Medicine

## 2018-03-02 NOTE — Telephone Encounter (Signed)
Called spoke with patient - it looks like we've been trying to get in touch with her about her Esbriet >> did the specialty pharmacy approve her for a grant?  Has she started the medication yet?  Called spoke with patient who reported that she has been approved for $9K and all she needs to do now is wait for the [place] to arrive which should be tomorrow or Thursday  Patient will keep her 7.22.19 appt with MR and will start pulmonary rehab in August  Will sign and route to MR to make him aware

## 2018-03-04 DIAGNOSIS — Z6825 Body mass index (BMI) 25.0-25.9, adult: Secondary | ICD-10-CM | POA: Diagnosis not present

## 2018-03-04 DIAGNOSIS — Z87891 Personal history of nicotine dependence: Secondary | ICD-10-CM | POA: Diagnosis not present

## 2018-03-04 DIAGNOSIS — Z299 Encounter for prophylactic measures, unspecified: Secondary | ICD-10-CM | POA: Diagnosis not present

## 2018-03-04 DIAGNOSIS — I10 Essential (primary) hypertension: Secondary | ICD-10-CM | POA: Diagnosis not present

## 2018-03-04 DIAGNOSIS — J8410 Pulmonary fibrosis, unspecified: Secondary | ICD-10-CM | POA: Diagnosis not present

## 2018-03-04 DIAGNOSIS — E785 Hyperlipidemia, unspecified: Secondary | ICD-10-CM | POA: Diagnosis not present

## 2018-03-05 ENCOUNTER — Telehealth: Payer: Self-pay | Admitting: Internal Medicine

## 2018-03-05 NOTE — Telephone Encounter (Signed)
Stop and reschedule next ov with MR to regroup

## 2018-03-05 NOTE — Telephone Encounter (Signed)
Spoke with pt. States that she is having a reaction to [employer]. Reports taking 1 tablet this morning and attempted to eat lunch and she could not keep her lunch down. Advised pt to not take her second dose until she hears back from our office.  MW - please advise as MR is not available. Thanks.

## 2018-03-05 NOTE — Telephone Encounter (Signed)
Spoke with patient-aware of recs from [place]. Will keep her OV for 03-15-18 at 9:15am.

## 2018-03-15 ENCOUNTER — Telehealth: Payer: Self-pay | Admitting: Internal Medicine

## 2018-03-15 ENCOUNTER — Ambulatory Visit (INDEPENDENT_AMBULATORY_CARE_PROVIDER_SITE_OTHER): Payer: Medicare HMO | Admitting: Internal Medicine

## 2018-03-15 ENCOUNTER — Other Ambulatory Visit (INDEPENDENT_AMBULATORY_CARE_PROVIDER_SITE_OTHER): Payer: Medicare HMO

## 2018-03-15 ENCOUNTER — Encounter: Payer: Self-pay | Admitting: Internal Medicine

## 2018-03-15 VITALS — BP 140/80 | HR 85 | Ht 65.0 in | Wt 157.4 lb

## 2018-03-15 DIAGNOSIS — Z5181 Encounter for therapeutic drug level monitoring: Secondary | ICD-10-CM

## 2018-03-15 DIAGNOSIS — J849 Interstitial pulmonary disease, unspecified: Secondary | ICD-10-CM | POA: Diagnosis not present

## 2018-03-15 DIAGNOSIS — R112 Nausea with vomiting, unspecified: Secondary | ICD-10-CM

## 2018-03-15 LAB — HEPATIC FUNCTION PANEL
ALT: 14 U/L (ref 0–35)
AST: 20 U/L (ref 0–37)
Albumin: 3.5 g/dL (ref 3.5–5.2)
Alkaline Phosphatase: 73 U/L (ref 39–117)
Bilirubin, Direct: 0.2 mg/dL (ref 0.0–0.3)
Total Bilirubin: 0.8 mg/dL (ref 0.2–1.2)
Total Protein: 7.9 g/dL (ref 6.0–8.3)

## 2018-03-15 NOTE — Addendum Note (Signed)
Addended by: Lorretta Harp on: 03/15/2018 10:09 AM   Modules accepted: Orders

## 2018-03-15 NOTE — Telephone Encounter (Signed)
I have sent this order to [facility]

## 2018-03-15 NOTE — Telephone Encounter (Signed)
Called and spoke with [name], about ONO ordered.  [employer], does not have a [employer]or[employer], so she will have to use another DME.  Will route to [facility] to follow up

## 2018-03-15 NOTE — Patient Instructions (Addendum)
ICD-10-CM   1. ILD (interstitial lung disease) (HCC) J84.9 Hepatic function panel  2. Encounter for therapeutic drug monitoring Z51.81 Hepatic function panel  3. Nausea and vomiting, intractability of vomiting not specified, unspecified vomiting type R11.2     ILD (interstitial lung disease) ([place]) - Plan: Hepatic function panel Encounter for therapeutic drug monitoring - Plan: Hepatic function panel - stable disease clinically - check LFT  - ok to restart esbriet per protocol - start date 03/15/18 - take esbriet with ginger capsule  -respect refusal for portable o2 - check ono and if abnormal we can start night o2 - research protocols a consideration in future  - glad you are going to start rehab  Nausea and vomiting, intractability of vomiting not specified, unspecified vomiting type - this was not because of esbriet x 1 tablet x first tablet  - this was probably reflection of your chronic problem  - refer [facility] doc - take ginger capsule OTC with esbriet - if recurs call us  Followup - 4-6 weeks do spirometry and dlco  - 4-6 weeks in ILD clinic

## 2018-03-15 NOTE — Progress Notes (Signed)
Subjective:     Patient ID: Ashlee Martin, female   DOB: [birth year], 84 y.o.   MRN: [id number]  HPI   PCP Glenda Chroman, MD  HPI  IOV 11/03/2017  Chief Complaint  Patient presents with  . Consult    Self referral. Pt states she was diagnosed with pulmonary fibrosis x4 weeks ago. States she has c/o cough with green mucus, mild SOB. Denies any CP.    84 year old female self-referred for evaluation of pulmonary fibrosis.  She is fairly good historian.  She tells me that she has diagnosis of psoriasis of the skin in 2016 and was under the care of Dr. Channing Mutters in [place] [address].  She was on Humira for 11 months taking monthly injections but her rash got bad in November 2017 and then she was treated with prednisone.  She went on vacation to [place] and then returned and then had a diagnosis of pneumonia.  She was hospitalized for this at [facility] in January 2018.  She was on oxygen and hospitalized for 3 or 4 days and then discharged.  After that she is always had some amount of residual cough and mild shortness of breath but starting approximately 6 months ago the cough and shortness of breath was more perceptible.  However in the last 2 months a significant acceleration in the severity of cough and shortness of breath.  The cough is described as waking up at night and associated with phlegm.  She also gets short of breath walking or hurrying on level ground or walking up a slight hill.  Her primary care physician did a CT chest with contrast in February 2019.  I do not have the image with me but I reviewed the report and he reports pulmonary fibrosis not otherwise specified.  She did end up in the emergency room on October 31, 2017 and is being discharged with [facility] and redness on taper which is helping.   American College of chest physicians interstitial lung disease questionnaire  Past medical history: Positive for seizures, history of pneumonia in January 2018, acid reflux disease, dry eyes, dry  mouth, photophobia and arthralgia.  She has diagnosis of skin psoriasis.  This is not much of an issue at this point in time  Personal exposure history: She started smoking cigarettes at 84 years of age and smoked 3-4 packs a day and quit in the mid 1980s.  She reports having had use street drugs.  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  She only occasionally visits the coop.  She opens the door and looks that she can out.  Otherwise no exposure to water damage or mold a humidifier [place] or hot tub or Jacuzzi or birds or feathered pillows.  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  Then she retired and moved to [place] in [place] where she lived in normal homes for 5 years up until 8 years ago and then she and her husband moved to [place], [place] where they live in a farm.  Pulmonary toxicity history: Other than exposure to Humira and short-term prednisone no other pulmonary toxicity history including nitrofurantoin or BCG or amiodarone or chemotherapy    Walking desaturation test on 11/03/2017 185 feet x 3 laps on ROOM AIR:  did walk at slow pace and got mildly dyspneic. Did desaturate to 89%. Rest pulse ox was 98%,  final pulse ox was 89%. HR response 67/min at rest to 91/min at peak exertion. [name]  Did not Desaturate < 88% . Ashlee Martin did yes  Desaturated </= 3% points. Ashlee Martin yes did get tachyardic    12/08/2017 Acute OV :Sinus congestion .  Patient presents for an acute office visit.  Patient complains of sinus congestion , ear fullness and popping , sinus pressure , drippy nose, watery eyes, very little cough .  Has had this for last few months , Worse for the last 2 weeks .  Seen by PCP last week started on allergy pill and nasal spray , she does not know what they were.  Says she has chronic allergy problems , worse in Spring and  Fall.  She denies any discolored mucus, sinus pain, teeth pain, fever, nausea vomiting diarrhea, or chest pain.    Patient was seen for pulmonary consult March 2019 to establish for pulmonary fibrosis.  She has underlying psoriasis.  Patient was set up for a CT chest that showed fibrotic interstitial lung disease with mild honeycombing.  Autoimmune panel was essentially negative except for SS B IgG antibody.  And sed rate was mildly elevated at 34. Patient has an upcoming PFT and follow-up visit with ILD clinic in 2 weeks. She was having a severe cough last visit.  She was given a prednisone taper.  Patient says she did require one other prednisone taper but her cough is almost totally resolved.  She has a little mild cough in the morning but very mild.   OV 12/29/2017  Chief Complaint  Patient presents with  . Follow-up    Pt states she has had both good days and bad days. States she has had some problems with allergies.    Ashlee Martin presns for followup for ILD workup.it is documented below.  Pulmonary function test in terms of severity shows moderate restriction with significant reduction in diffusion capacity.  Her high-resolution CT scan of the chest is read as probable UIP but I wonder if that is mixed emphysema although she quit smoking many decades ago and indeterminate pattern for UIP.  There might be air trapping but this is not reported.  I again went over her exposures and she confirms and her husband who is here with her today that there is significant chicken coop exposure ongoing currently.  There are no new interim issues other than the fact she had a respiratory exacerbation which required prednisone which seemed to help her symptoms considerably.  Currently she is off prednisone.  Her autoimmune vasculitis and hypersensitivity pneumonitis panel was essentially negative except for trace autoimmune positivity.     Results for Ashlee Martin, Ashlee Martin (MRN [id number]) as of 12/29/2017 13:37   Ref. Range 12/29/2017 12:43  FVC-Pre Latest Units: L 1.49  FVC-%Pred-Pre Latest Units: % 58  FEV1-Pre Latest Units: L 1.18  FEV1-%Pred-Pre Latest Units: % 62  Pre FEV1/FVC ratio Latest Units: % 79  FEV1FVC-%Pred-Pre Latest Units: % 108  Results for Ashlee Martin, Ashlee Martin (MRN [id number]) as of 12/29/2017 13:37  Ref. Range 12/29/2017 12:43  DLCO unc Latest Units: ml/min/mmHg 7.82  DLCO unc % pred Latest Units: % 30   Results for Ashlee Martin, Ashlee Martin (MRN [id number]) as of 12/29/2017 13:37  Ref. Range 11/03/2017 10:08  ASPERGILLUS FUMIGATUS Latest Ref Range: NEGATIVE  NEGATIVE  [place] Serum Latest Ref Range: NEGATIVE  NEGATIVE  Anit Nuclear Antibody(ANA) Latest Ref Range: NEGATIVE  NEGATIVE  Angiotensin-Converting Enzyme Latest Ref Range: 9 - 67 U/L 28  Cyclic Citrullin Peptide Ab Latest Units: UNITS <16  ds DNA Ab Latest Units: IU/mL <1  ENA RNP Ab Latest Ref Range: 0.0 - 0.9 AI <0.2  Myeloperoxidase Abs Latest Units: AI <1.0  Serine Protease 3 Latest Units: AI <1.0  RA Latex Turbid. Latest Ref Range: <14 IU/mL <14  SSA (Ro) (ENA) Antibody, IgG Latest Ref Range: <1.0 NEG AI <1.0 NEG  SSB (La) (ENA) Antibody, IgG Latest Ref Range: <1.0 NEG AI 1.8 POS (A)  Scleroderma (Scl-70) (ENA) Antibody, IgG Latest Ref Range: <1.0 NEG AI <1.0 NEG    IMPRESSION: 1. Spectrum of findings compatible with fibrotic interstitial lung disease with mild honeycombing. Despite the absence of a clear basilar gradient, these findings are considered to represent probable usual interstitial pneumonia (UIP). Fibrotic phase nonspecific interstitial pneumonia (NSIP) is on the differential. Follow-up high-resolution chest CT in 6-12 months would be useful to assess temporal pattern stability, as clinically warranted. 2. Tiny 3 mm solid left lower lobe pulmonary nodule. No follow-up needed if patient is low-risk. Non-contrast chest CT can be considered in 12 months if patient is high-risk. This recommendation follows the consensus  statement: Guidelines for Management of Incidental Pulmonary Nodules Detected on CT Images:From the Fleischner Society 2017; published online before print (10.1148/radiol.4196222979). 3. Three-vessel coronary atherosclerosis. 4. Dilated main pulmonary artery, suggesting pulmonary arterial hypertension.  Aortic Atherosclerosis (ICD10-I70.0).   Electronically Signed   By: Ilona Sorrel M.D.   On: 11/13/2017 16:46   OV 02/02/2018  Chief Complaint  Patient presents with  . Follow-up    Pt states things have been up and down since last visit and since bronch was performed. Pt still has problems with upset stomach and pt is still having problems with her breathing. Pt does cough in the morning with occ. green mucus. Denies any CP/chest tightness.    Follow-up interstitial lung disease.  This follow-up is after bronchoscopy with lavage which was done Jan 07, 2018.  This shows a slight preponderance of polymorphs and absence of lymphocytes thus suggesting this is not hypersensitivity pneumonitis but with the increase in polymorphs the diagnosis shift towards UIP.  I took a second opinion on radiology from Dr. Lorin Picket and she feels a CT scan is more consistent with UIP.  In the interim patient has no new complaints.  Walking desaturation test indicates a 7% pulse ox drop with tachycardia and shortness of breath but still does not drop below 88%.  Of note she does indicate that she has had chronic intermittent stress related nausea that has been worked up extensively but without any cause.   Results for Ashlee Martin, Ashlee Martin (MRN [id number]) as of 01/07/2018 20:04  Ref. Range 01/07/2018 14:35  Color, Fluid Latest Ref Range: YELLOW  STRAW (A)  WBC, Fluid Latest Ref Range: 0 - 1,000 cu mm 73  Lymphs, Fluid Latest Units: % 6  Eos, Fluid Latest Units: % 4  Appearance, Fluid Latest Ref Range: CLEAR  CLOUDY (A)  Other Cells, Fluid Latest Units: % CORRELATE WITH CY...  Neutrophil Count, Fluid Latest  Ref Range: 0 - 25 % 55 (H)  Monocyte-Macrophage-Serous Fluid Latest Ref Range: 50 - 90 % 35 (L)     OV 03/15/2018  Chief Complaint  Patient presents with  . Follow-up    Pt began taking Esbriet but had been having problems keeping food down while taking the med. Med was stopped 7/12 by MW due to the possible reaction and was told to stop med until OV with MR. Other  than the beginning problems with Esbriet, pt states she has been doing good since last visit   [place] , 84 y.o. , with dob [birth year] and female ,Not Hispanic or Latino from [address] [place] [address] - presents to ILD  clinic for IPF diagnosis (he is aage > 32 (age 52), probable UIP on CT scan with honeycombing early, negative autoimmune and vasculitis serology and bronchoscopy 01/07/18 with 55% neutrophils on lavage) made 02/02/18 and esbriet decision taken 02/02/18   In terms of her IPF she is doing stable. She does not feel any worse. She gets dyspneic when walking out of her air conditioned room to the hot humid air she does not feel any worse. Walking desaturation test showed desaturation to 87% which seems a little bit worse than before but she denies that she is worse. She does not want to use portable oxygen but she is willing to get herself tested at night. There is no worsening cough or sputum production.  The main issue appears to be that on 03/05/2018 she started her first dose of Pirfenidone (Esbriet). She took one dose and felt fine and then 4 hours later immediately after lunch 12 her food and had nausea and vomiting. She does not believe that the nausea and vomiting were related to the Pirfenidone (Esbriet). She inserted thinks it is a recurrence of her chronic intermittent nausea and vomiting which is had for the last few years. We took more history on this and she tells me that she's had this for the last few years. It is stable. She says primary care physician worked up extensively. However she is  noticing a gastroenterologist or had an endoscopy for this.     Simple office walk 185 feet x  3 laps goal with forehead probe 02/02/2018 Start esbriet 03/15/2018   O2 used Room air Room air  Number laps completed 3 3  Comments about pace nrmal pacte Normal pace  Resting Pulse Ox/HR 98% and 83/min 98% and HR 85/min  Final Pulse Ox/HR 91% and 98/min 87% and HR 91% at end of 3rd lao  Desaturated </= 88% no yes  Desaturated <= 3% points yes yes  Got Tachycardic >/= 90/min yes yes  Symptoms at end of test dyspnea Mild dyspnea  Miscellaneous comments Normal pace Normal pace     Review of Systems     Objective:   Physical Exam  Constitutional: She is oriented to person, place, and time. She appears well-developed and well-nourished. No distress.  HENT:  Head: Normocephalic and atraumatic.  Right Ear: External ear normal.  Left Ear: External ear normal.  Mouth/Throat: Oropharynx is clear and moist. No oropharyngeal exudate.  Eyes: Pupils are equal, round, and reactive to light. Conjunctivae and EOM are normal. Right eye exhibits no discharge. Left eye exhibits no discharge. No scleral icterus.  Neck: Normal range of motion. Neck supple. No JVD present. No tracheal deviation present. No thyromegaly present.  Cardiovascular: Normal rate, regular rhythm, normal heart sounds and intact distal pulses. Exam reveals no gallop and no friction rub.  No murmur heard. Pulmonary/Chest: Effort normal. No respiratory distress. She has no wheezes. She has rales. She exhibits no tenderness.  bilateral crackles +  Abdominal: Soft. Bowel sounds are normal. She exhibits no distension and no mass. There is no tenderness. There is no rebound and no guarding.  Musculoskeletal: Normal range of motion. She exhibits no edema or tenderness.  Lymphadenopathy:    She has no cervical  adenopathy.  Neurological: She is alert and oriented to person, place, and time. She has normal reflexes. No cranial nerve  deficit. She exhibits normal muscle tone. Coordination normal.  Skin: Skin is warm and dry. No rash noted. She is not diaphoretic. No erythema. No pallor.  Psychiatric: She has a normal mood and affect. Her behavior is normal. Judgment and thought content normal.  Vitals reviewed.  Vitals:   03/15/18 0920 03/15/18 0922  BP: 140/80   Pulse: 85   SpO2: (!) 86% 93%  Weight: 157 lb 6.4 oz (71.4 kg)   Height: 5' 5" (1.651 m)     Estimated body mass index is 26.19 kg/m as calculated from the following:   Height as of this encounter: 5' 5" (1.651 m).   Weight as of this encounter: 157 lb 6.4 oz (71.4 kg).     Assessment:       ICD-10-CM   1. ILD (interstitial lung disease) ([place]) J84.9 Hepatic function panel    Ambulatory referral to Gastroenterology    Pulmonary function test  2. Encounter for therapeutic drug monitoring Z51.81 Hepatic function panel    Ambulatory referral to Gastroenterology    Pulmonary function test  3. Nausea and vomiting, intractability of vomiting not specified, unspecified vomiting type R11.2 Ambulatory referral to Gastroenterology    Pulmonary function test       Plan:       ILD (interstitial lung disease) ([place]) - Plan: Hepatic function panel Encounter for therapeutic drug monitoring - Plan: Hepatic function panel - stable disease clinically - check LFT  - ok to restart esbriet per protocol - start date 03/15/18 - take esbriet with ginger capsule  -respect refusal for portable o2 - check ono and if abnormal we can start night o2 - - research protocols a consideration in future  - glad you are going to start rehab    Nausea and vomiting, intractability of vomiting not specified, unspecified vomiting type - recurred - this was not because of esbriet x 1 tablet x first tablet  - this was probably reflection of your chronic problem - however pulmonary anti-fibrotic still increase the risk for nausea and vomiting. Therefore we will have her monitor  this situation closely and I believe he should get your nausea and vomitings orted out through gastroenterologist to give the best chance to handle the anti-fibrotic's  - refer [facility] doc - take ginger capsule OTC with esbriet - if recurs call us  Followup - 4-6 weeks do spirometry and dlco  - 4-6 weeks in ILD clinic   > 50% of this > 25 min visit spent in face to face counseling or coordination of care - by this undersigned MD - Dr Brand Males. This includes one or more of the following documented above: discussion of test results, diagnostic or treatment recommendations, prognosis, risks and benefits of management options, instructions, education, compliance or risk-factor reduction   Dr. Brand Males, M.D., [facility].C.P Pulmonary and Critical Care Medicine Staff Physician, [place] Director - Interstitial Lung Disease  Program  Pulmonary [place] [facility] [place], [place], [address]  Pager: [phone], If no answer or between  15:00h - 7:00h: call [phone] Telephone: [phone]

## 2018-03-17 ENCOUNTER — Telehealth: Payer: Self-pay | Admitting: Internal Medicine

## 2018-03-17 NOTE — Telephone Encounter (Signed)
Notes recorded by Brand Males, MD on 03/15/2018 at 4:25 PM EDT lft normal   Spoke with pt and notified of results per Dr. Chase Caller. Pt verbalized understanding and denied any questions.

## 2018-03-17 NOTE — Progress Notes (Signed)
See PN dated 03/17/18

## 2018-03-19 ENCOUNTER — Encounter: Payer: Self-pay | Admitting: Gastroenterology

## 2018-03-23 ENCOUNTER — Other Ambulatory Visit: Payer: Self-pay | Admitting: Internal Medicine

## 2018-03-31 DIAGNOSIS — J449 Chronic obstructive pulmonary disease, unspecified: Secondary | ICD-10-CM | POA: Diagnosis not present

## 2018-03-31 DIAGNOSIS — R0902 Hypoxemia: Secondary | ICD-10-CM | POA: Diagnosis not present

## 2018-04-05 ENCOUNTER — Telehealth: Payer: Self-pay | Admitting: Internal Medicine

## 2018-04-05 DIAGNOSIS — R112 Nausea with vomiting, unspecified: Secondary | ICD-10-CM

## 2018-04-05 NOTE — Telephone Encounter (Signed)
GI appointment is not not until 05/2018. I called and spoke with the patient ,She states she is taking Esbriet  medication as directed at last OV 03/15/18 taking on a full stomach and with ginger root and also ginger caps and Nausea medication.  At the time of last Office visit patient was taking 2 pills three times a day. The symptoms did improve. Once she started 3 pills 3 times a day with recommendations she became very ill with nausea and vomiting she can not keep anything down. She start new dose on 03/30/18.  Dr. Chase Caller please advise, Thank you.

## 2018-04-05 NOTE — Telephone Encounter (Signed)
Pt aware of recs.  New urgent referral placed to [facility] to try and get pt in this month if possible.  Nothing further needed at this time.

## 2018-04-05 NOTE — Telephone Encounter (Signed)
1. For GI - see if Dr Juanita Craver GI or her partner Dr Benson [place] can see patient sooner esp in august 2019. Let me know  2. Esbriet - go back down to 2 pills tid with meals until further notice a=- I will decide what to do 04/27/18 - options are to change to Ofev but that can cause diarrhea or continue esbriet at 2 pills tid til GI sees her and sortsout

## 2018-04-08 ENCOUNTER — Encounter (HOSPITAL_COMMUNITY)
Admission: RE | Admit: 2018-04-08 | Discharge: 2018-04-08 | Disposition: A | Discharge status: Home / Self Care | Payer: Medicare HMO | Source: Ambulatory Visit | Attending: Internal Medicine | Admitting: Internal Medicine

## 2018-04-08 ENCOUNTER — Encounter (HOSPITAL_COMMUNITY): Payer: Self-pay

## 2018-04-08 VITALS — BP 110/62 | HR 79 | Ht 65.0 in | Wt 152.7 lb

## 2018-04-08 DIAGNOSIS — Z79899 Other long term (current) drug therapy: Secondary | ICD-10-CM | POA: Diagnosis not present

## 2018-04-08 DIAGNOSIS — Z7982 Long term (current) use of aspirin: Secondary | ICD-10-CM | POA: Diagnosis not present

## 2018-04-08 DIAGNOSIS — J849 Interstitial pulmonary disease, unspecified: Secondary | ICD-10-CM | POA: Diagnosis not present

## 2018-04-08 DIAGNOSIS — Z87891 Personal history of nicotine dependence: Secondary | ICD-10-CM | POA: Diagnosis not present

## 2018-04-08 NOTE — Progress Notes (Signed)
Cardiac/Pulmonary Rehab Medication Review by a Pharmacist  Does the patient  feel that his/her medications are working for him/her?  yes  Has the patient been experiencing any side effects to the medications prescribed?  yes  Does the patient measure his/her own blood pressure or blood glucose at home?  yes   Does the patient have any problems obtaining medications due to transportation or finances?   no  Understanding of regimen: good Understanding of indications: good Potential of compliance: excellent  Questions asked to Determine Patient Understanding of Medication Regimen:  1. What is the name of the medication?  2. What is the medication used for?  3. When should it be taken?  4. How much should be taken?  5. How will you take it?  6. What side effects should you report?  Understanding Defined as: Excellent: All questions above are correct Good: Questions 1-4 are correct Fair: Questions 1-2 are correct  Poor: 1 or none of the above questions are correct   Pharmacist comments: Very pleasant lady presents to pulmonary rehab. We reviewed her medications and she is compliant and tolerating her current regimen. The patient recently had to change esbriet dose back to 2 tabs TID for pulmonary fibrosis due to GI issues. Patient is compliant with regimen, but with GI symptoms sometimes will not take certain drugs. She does monitor her BP daily and as needed. Continue current regimen.  Thanks for the opportunity to participate in the care of this patient,  Isac Sarna, BS [place], [place] Clinical Pharmacist Pager [phone] 04/08/2018 1:43 PM

## 2018-04-08 NOTE — Progress Notes (Signed)
Daily Session Note  Patient Details  Name: SHARUNDA SALMON MRN: [id number] Date of Birth: 1931-03-17 Referring Provider:     PULMONARY REHAB OTHER RESP ORIENTATION from 04/08/2018 in [place]  Referring Provider  Ramaswamy      Encounter Date: 04/08/2018  Check In: Session Check In - 04/08/18 1230      Check-In   Supervising physician immediately available to respond to emergencies  See telemetry face sheet for immediately available MD    Location  [facility]    Staff Present  Russella Dar, MS, EP, [facility], Exercise Physiologist;Debra Wynetta Emery, RN, BSN    Medication changes reported      No    Fall or balance concerns reported     No    Tobacco Cessation  --   Quit 1974   Warm-up and Cool-down  Performed as group-led instruction    Resistance Training Performed  Yes    VAD Patient?  No    PAD/SET Patient?  No      Pain Assessment   Currently in Pain?  No/denies    Pain Score  0-No pain    Multiple Pain Sites  No       Capillary Blood Glucose: No results found for this or any previous visit (from the past 24 hour(s)).    Social History   Tobacco Use  Smoking Status Former Smoker  . Packs/day: 3.00  . Years: 20.00  . Pack years: 60.00  . Types: Cigarettes  . Last attempt to quit: 11/03/1972  . Years since quitting: 45.4  Smokeless Tobacco Never Used    Goals Met:  Proper associated with RPD/PD & O2 Sat Independence with exercise equipment Exercise tolerated well Personal goals reviewed Queuing for purse lip breathing No report of cardiac concerns or symptoms Strength training completed today  Goals Unmet:  Not Applicable  Comments: Check out: 1500  Dr. Sinda Du is Medical Director for [facility].

## 2018-04-08 NOTE — Progress Notes (Signed)
Pulmonary Individual Treatment Plan  Patient Details  Name: Ashlee Martin MRN: [id number] Date of Birth: [birth year] Referring Provider:     PULMONARY REHAB OTHER RESP ORIENTATION from 04/08/2018 in [place]  Referring Provider  [place]      Initial Encounter Date:    PULMONARY REHAB OTHER RESP ORIENTATION from 04/08/2018 in [place]  Date  04/08/18      Visit Diagnosis: ILD (interstitial lung disease) ([place])  Patient's Home Medications on Admission:   Current Outpatient Medications:  .  aspirin (GOODSENSE ASPIRIN) 325 MG tablet, Take 325 mg by mouth at bedtime. , Disp: , Rfl:  .  atenolol (TENORMIN) 50 MG tablet, Take 25 mg by mouth 2 (two) times daily. , Disp: , Rfl:  .  dextromethorphan-guaiFENesin (MUCINEX DM) 30-600 MG 12hr tablet, Take 1 tablet by mouth 2 (two) times daily., Disp: , Rfl:  .  doxylamine, Sleep, (UNISOM) 25 MG tablet, Take 25 mg by mouth at bedtime as needed for sleep. , Disp: , Rfl:  .  omeprazole (PRILOSEC OTC) 20 MG tablet, Take 20 mg by mouth at bedtime., Disp: , Rfl:  .  ondansetron (ZOFRAN) 4 MG tablet, TAKE 1 TABLET (4 MG TOTAL) BY MOUTH 3 (THREE) TIMES DAILY. (Patient taking differently: Take 4 mg by mouth 3 (three) times daily as needed for nausea or vomiting. ), Disp: 30 tablet, Rfl: 3 .  Pirfenidone (ESBRIET) 267 MG TABS, Take 2 tablets by mouth 3 (three) times daily. , Disp: , Rfl:  .  Polyethyl Glycol-Propyl Glycol (SYSTANE OP), Place 1 drop into both eyes daily as needed. , Disp: , Rfl:  .  rOPINIRole (REQUIP) 2 MG tablet, Take 2 mg by mouth at bedtime as needed (restless legs). , Disp: , Rfl:  .  simvastatin (ZOCOR) 20 MG tablet, Take 20 mg by mouth every evening. , Disp: , Rfl:  .  VENTOLIN HFA 108 (90 Base) MCG/ACT inhaler, INHALE 1 TO 2 PUFFS BY MOUTH FOUR TIMES A DAY AS NEEDED FOR CHEST CONGESTION/COUGH, Disp: , Rfl: 0 .  vitamin B-12 (CYANOCOBALAMIN) 1000 MCG tablet, Take 1,000 mcg by mouth at  bedtime. , Disp: , Rfl:   Past Medical History: History reviewed. No pertinent past medical history.  Tobacco Use: Social History   Tobacco Use  Smoking Status Former Smoker  . Packs/day: 3.00  . Years: 20.00  . Pack years: 60.00  . Types: Cigarettes  . Last attempt to quit: 11/03/1972  . Years since quitting: 45.4  Smokeless Tobacco Never Used    Labs: Recent Review Flowsheet Data    There is no flowsheet data to display.      Capillary Blood Glucose: No results found for: GLUCAP   Pulmonary Assessment Scores: Pulmonary Assessment Scores    Row Name 04/08/18 1617         ADL UCSD   ADL Phase  Entry     SOB Score total  41     Rest  0     Walk  8     Stairs  1     Bath  1     Dress  1     Shop  3       CAT Score   CAT Score  16       mMRC Score   mMRC Score  3        Pulmonary Function Assessment: Pulmonary Function Assessment - 04/08/18 1616      Pulmonary Function Tests  FVC%  58 %    FEV1%  62 %    FEV1/FVC Ratio  108    RV%  35 %    DLCO%  30 %      Initial Spirometry Results   FVC%  58 %    FEV1%  62 %    FEV1/FVC Ratio  108      Breath   Bilateral Breath Sounds  Clear    Shortness of Breath  Yes   Mostly during walk test.       Exercise Target Goals: Exercise Program Goal: Individual exercise prescription set using results from initial 6 min walk test and THRR while considering  patient's activity barriers and safety.   Exercise Prescription Goal: Initial exercise prescription builds to 30-45 minutes a day of aerobic activity, 2-3 days per week.  Home exercise guidelines will be given to patient during program as part of exercise prescription that the participant will acknowledge.  Activity Barriers & Risk Stratification:   6 Minute Walk: 6 Minute Walk    Row Name 04/08/18 1537         6 Minute Walk   Phase  Initial     Distance  800 feet     Walk Time  6 minutes     # of Rest Breaks  1     MPH  1.51     METS  2.16      RPE  13     Perceived Dyspnea   13     Resting HR  68 bpm     Resting BP  110/62     Resting Oxygen Saturation   91 %     Exercise Oxygen Saturation  during 6 min walk  88 %     Max Ex. HR  93 bpm     Max Ex. BP  130/64     2 Minute Post BP  124/62        Oxygen Initial Assessment: Oxygen Initial Assessment - 04/08/18 1615      Home Oxygen   Home Oxygen Device  None    Sleep Oxygen Prescription  None    Home Exercise Oxygen Prescription  None    Home at Rest Exercise Oxygen Prescription  None    Compliance with Home Oxygen Use  --   N/A     Initial 6 min Walk   Oxygen Used  None      Program Oxygen Prescription   Program Oxygen Prescription  None       Oxygen Re-Evaluation:   Oxygen Discharge (Final Oxygen Re-Evaluation):   Initial Exercise Prescription: Initial Exercise Prescription - 04/08/18 1500      Date of Initial Exercise RX and Referring Provider   Date  04/08/18    Referring Provider  Ramaswamy    Expected Discharge Date  07/24/18      Treadmill   MPH  0.8    Grade  0    Minutes  17    METs  1.6      NuStep   Level  1    SPM  42    Minutes  22    METs  1.7      Prescription Details   Frequency (times per week)  2    Duration  Progress to 30 minutes of continuous aerobic without signs/symptoms of physical distress      Intensity   THRR 40-80% of Max Heartrate  102-113-125    Ratings of Perceived  Exertion  11-13    Perceived Dyspnea  0-4      Progression   Progression  Continue progressive overload as per policy without signs/symptoms or physical distress.      Resistance Training   Training Prescription  Yes    Weight  1    Reps  10-15       Perform Capillary Blood Glucose checks as needed.  Exercise Prescription Changes:   Exercise Comments:   Exercise Goals and Review:  Exercise Goals    Row Name 04/08/18 1553             Exercise Goals   Increase Physical Activity  Yes       Intervention  Provide advice,  education, support and counseling about physical activity/exercise needs.;Develop an individualized exercise prescription for aerobic and resistive training based on initial evaluation findings, risk stratification, comorbidities and participant's personal goals.       Expected Outcomes  Short Term: Attend rehab on a regular basis to increase amount of physical activity.;Long Term: Add in home exercise to make exercise part of routine and to increase amount of physical activity.       Increase Strength and Stamina  Yes       Intervention  Provide advice, education, support and counseling about physical activity/exercise needs.;Develop an individualized exercise prescription for aerobic and resistive training based on initial evaluation findings, risk stratification, comorbidities and participant's personal goals.       Expected Outcomes  Short Term: Increase workloads from initial exercise prescription for resistance, speed, and METs.;Long Term: Improve cardiorespiratory fitness, muscular endurance and strength as measured by increased METs and functional capacity (6MWT)       Able to understand and use rate of perceived exertion (RPE) scale  Yes       Intervention  Provide education and explanation on how to use RPE scale       Expected Outcomes  Short Term: Able to use RPE daily in rehab to express subjective intensity level;Long Term:  Able to use RPE to guide intensity level when exercising independently       Able to understand and use Dyspnea scale  Yes       Intervention  Provide education and explanation on how to use Dyspnea scale       Expected Outcomes  Short Term: Able to use Dyspnea scale daily in rehab to express subjective sense of shortness of breath during exertion;Long Term: Able to use Dyspnea scale to guide intensity level when exercising independently       Knowledge and understanding of Target Heart Rate Range (THRR)  Yes       Intervention  Provide education and explanation of THRR  including how the numbers were predicted and where they are located for reference       Expected Outcomes  Short Term: Able to use daily as guideline for intensity in rehab;Long Term: Able to use THRR to govern intensity when exercising independently       Able to check pulse independently  Yes       Intervention  Provide education and demonstration on how to check pulse in carotid and radial arteries.;Review the importance of being able to check your own pulse for safety during independent exercise       Expected Outcomes  Short Term: Able to explain why pulse checking is important during independent exercise;Long Term: Able to check pulse independently and accurately       Understanding of Exercise Prescription  Yes       Intervention  Provide education, explanation, and written materials on patient's individual exercise prescription       Expected Outcomes  Short Term: Able to explain program exercise prescription;Long Term: Able to explain home exercise prescription to exercise independently          Exercise Goals Re-Evaluation :   Discharge Exercise Prescription (Final Exercise Prescription Changes):   Nutrition:  Target Goals: Understanding of nutrition guidelines, daily intake of sodium <1577m, cholesterol <2070m calories 30% from fat and 7% or less from saturated fats, daily to have 5 or more servings of fruits and vegetables.  Biometrics: Pre Biometrics - 04/08/18 1610      Pre Biometrics   Height  _0  (1.651 m)    Weight  152 lb 11.2 oz (69.3 kg)    Waist Circumference  26 inches    Hip Circumference  29 inches    Waist to Hip Ratio  0.9 %    BMI (Calculated)  25.41    Triceps Skinfold  6 mm    % Body Fat  15 %    Grip Strength  32 kg    Flexibility  0 in    Single Leg Stand  10 seconds        Nutrition Therapy Plan and Nutrition Goals: Nutrition Therapy & Goals - 04/08/18 1620      Personal Nutrition Goals   Personal Goal #2  Patient is trying to eat a heart  healthy diet.        Nutrition Assessments: Nutrition Assessments - 04/08/18 1620      MEDFICTS Scores   Pre Score  27       Nutrition Goals Re-Evaluation:   Nutrition Goals Discharge (Final Nutrition Goals Re-Evaluation):   Psychosocial: Target Goals: Acknowledge presence or absence of significant depression and/or stress, maximize coping skills, provide positive support system. Participant is able to verbalize types and ability to use techniques and skills needed for reducing stress and depression.  Initial Review & Psychosocial Screening: Initial Psych Review & Screening - 04/08/18 1612      Initial Review   Current issues with  None Identified      Family Dynamics   Good Support System?  Yes      Barriers   Psychosocial barriers to participate in program  There are no identifiable barriers or psychosocial needs.      Screening Interventions   Interventions  Encouraged to exercise    Expected Outcomes  Short Term goal: Identification and review with participant of any Quality of Life or Depression concerns found by scoring the questionnaire.;Long Term goal: The participant improves quality of Life and PHQ9 Scores as seen by post scores and/or verbalization of changes       Quality of Life Scores: Quality of Life - 04/08/18 1613      Quality of Life   Select  Quality of Life      Quality of Life Scores   Health/Function Pre  20.38 %    Socioeconomic Pre  19.71 %    Psych/Spiritual Pre  20.14 %    Family Pre  20.6 %    GLOBAL Pre  20.23 %      Scores of 19 and below usually indicate a poorer quality of life in these areas.  A difference of  2-3 points is a clinically meaningful difference.  A difference of 2-3 points in the total score of the Quality of Life Index has been  associated with significant improvement in overall quality of life, self-image, physical symptoms, and general health in studies assessing change in quality of life.   PHQ-9: Recent Review  Flowsheet Data    Depression screen [facility] 2/9 04/08/2018   Decreased Interest 2   Down, Depressed, Hopeless 1   PHQ - 2 Score 3   Altered sleeping 0   Tired, decreased energy 1   Change in appetite 1   Feeling bad or failure about yourself  1   Trouble concentrating 1   Moving slowly or fidgety/restless 0   Suicidal thoughts 0   PHQ-9 Score 7   Difficult doing work/chores Somewhat difficult     Interpretation of Total Score  Total Score Depression Severity:  1-4 = Minimal depression, 5-9 = Mild depression, 10-14 = Moderate depression, 15-19 = Moderately severe depression, 20-27 = Severe depression   Psychosocial Evaluation and Intervention: Psychosocial Evaluation - 04/08/18 1613      Psychosocial Evaluation & Interventions   Interventions  Encouraged to exercise with the program and follow exercise prescription    Continue Psychosocial Services   No Follow up required       Psychosocial Re-Evaluation:   Psychosocial Discharge (Final Psychosocial Re-Evaluation):    Education: Education Goals: Education classes will be provided on a weekly basis, covering required topics. Participant will state understanding/return demonstration of topics presented.  Learning Barriers/Preferences: Learning Barriers/Preferences - 04/08/18 1520      Learning Barriers/Preferences   Learning Barriers  None    Learning Preferences  Skilled Demonstration;Group Instruction;Verbal Instruction       Education Topics: How Lungs Work and Diseases: - Discuss the anatomy of the lungs and diseases that can affect the lungs, such as COPD.   Exercise: -Discuss the importance of exercise, FITT principles of exercise, normal and abnormal responses to exercise, and how to exercise safely.   Environmental Irritants: -Discuss types of environmental irritants and how to limit exposure to environmental irritants.   Meds/Inhalers and oxygen: - Discuss respiratory medications, definition of an  inhaler and oxygen, and the proper way to use an inhaler and oxygen.   Energy Saving Techniques: - Discuss methods to conserve energy and decrease shortness of breath when performing activities of daily living.    Bronchial Hygiene / Breathing Techniques: - Discuss breathing mechanics, pursed-lip breathing technique,  proper posture, effective ways to clear airways, and other functional breathing techniques   Cleaning Equipment: - Provides group verbal and written instruction about the health risks of elevated stress, cause of high stress, and healthy ways to reduce stress.   Nutrition I: Fats: - Discuss the types of cholesterol, what cholesterol does to the body, and how cholesterol levels can be controlled.   Nutrition II: Labels: -Discuss the different components of food labels and how to read food labels.   Respiratory Infections: - Discuss the signs and symptoms of respiratory infections, ways to prevent respiratory infections, and the importance of seeking medical treatment when having a respiratory infection.   Stress I: Signs and Symptoms: - Discuss the causes of stress, how stress may lead to anxiety and depression, and ways to limit stress.   Stress II: Relaxation: -Discuss relaxation techniques to limit stress.   Oxygen for Home/Travel: - Discuss how to prepare for travel when on oxygen and proper ways to transport and store oxygen to ensure safety.   Knowledge Questionnaire Score: Knowledge Questionnaire Score - 04/08/18 1621      Knowledge Questionnaire Score   Pre Score  12/18  Core Components/Risk Factors/Patient Goals at Admission: Personal Goals and Risk Factors at Admission - 04/08/18 1621      Core Components/Risk Factors/Patient Goals on Admission    Weight Management  Weight Maintenance    Personal Goal Other  Yes    Personal Goal  Gain energy, increase appetite, continue to get stronger through PR program    Intervention  Attend class 2  x week, supplement with 3 x week exercise at home.     Expected Outcomes  Reach personal goals.        Core Components/Risk Factors/Patient Goals Review:    Core Components/Risk Factors/Patient Goals at Discharge (Final Review):    ITP Comments:   Comments: Patient arrived for 1st visit/orientation/education at 1230. Patient was referred to PR by Dr. Chase Caller due to ILD (J84.9). During orientation advised patient on arrival and appointment times what to wear, what to do before, during and after exercise. Reviewed attendance and class policy. Talked about inclement weather and class consultation policy. Pt is scheduled to return Pulmonary Rehab on 04/13/18 at 1330. Pt was advised to come to class 15 minutes before class starts. Patient was also given instructions on meeting with the dietician and attending the Family Structure classes. Discussed RPE/Dpysnea scales. Discussed initial THR and how to find their radial and/or carotid pulse. Discussed the initial exercise prescription and how this effects their progress. Pt is eager to get started. Patient participated in warm up stretches followed by light weights and resistance bands. Patient was able to complete 6 minute walk test. Patient c/o SOB and tiredness. She had to stop at 2 minutes 57 seconds. SaO2 83, HR 86. She rested 2 minutes SaO2 returned to 92%. She resumed test walked 2 more laps finishing the 6 minutes. Patient recovered completely. Patient was measured for the equipment. Discussed equipment safety with patient. Took patient pre-anthropometric measurements. Patient finished visit at 1500.

## 2018-04-13 ENCOUNTER — Encounter (HOSPITAL_COMMUNITY)
Admission: RE | Admit: 2018-04-13 | Discharge: 2018-04-13 | Disposition: A | Discharge status: Home / Self Care | Payer: Medicare HMO | Source: Ambulatory Visit | Attending: Internal Medicine | Admitting: Internal Medicine

## 2018-04-13 DIAGNOSIS — Z7982 Long term (current) use of aspirin: Secondary | ICD-10-CM | POA: Diagnosis not present

## 2018-04-13 DIAGNOSIS — J849 Interstitial pulmonary disease, unspecified: Secondary | ICD-10-CM

## 2018-04-13 DIAGNOSIS — Z87891 Personal history of nicotine dependence: Secondary | ICD-10-CM | POA: Diagnosis not present

## 2018-04-13 DIAGNOSIS — Z79899 Other long term (current) drug therapy: Secondary | ICD-10-CM | POA: Diagnosis not present

## 2018-04-13 NOTE — Progress Notes (Signed)
Daily Session Note  Patient Details  Name: Ashlee Martin MRN: [id number] Date of Birth: [birth year] Referring Provider:     PULMONARY REHAB OTHER RESP ORIENTATION from 04/08/2018 in [place]  Referring Provider  Ramaswamy      Encounter Date: 04/13/2018  Check In: Session Check In - 04/13/18 1330      Check-In   Supervising physician immediately available to respond to emergencies  See telemetry face sheet for immediately available MD    Location  [facility]    Staff Present  Russella Dar, MS, EP, [facility], Exercise Physiologist;Other    Medication changes reported      No    Fall or balance concerns reported     No    Tobacco Cessation  No Change    Warm-up and Cool-down  Performed as group-led instruction    Resistance Training Performed  Yes    VAD Patient?  No    PAD/SET Patient?  No      Pain Assessment   Currently in Pain?  No/denies    Pain Score  0-No pain    Multiple Pain Sites  No       Capillary Blood Glucose: No results found for this or any previous visit (from the past 24 hour(s)).    Social History   Tobacco Use  Smoking Status Former Smoker  . Packs/day: 3.00  . Years: 20.00  . Pack years: 60.00  . Types: Cigarettes  . Last attempt to quit: 11/03/1972  . Years since quitting: 45.4  Smokeless Tobacco Never Used    Goals Met:  Proper associated with RPD/PD & O2 Sat Independence with exercise equipment Improved SOB with ADL's Exercise tolerated well Personal goals reviewed Queuing for purse lip breathing No report of cardiac concerns or symptoms Strength training completed today  Goals Unmet:  Not Applicable  Comments: Pt able to follow exercise prescription today without complaint.  Will continue to monitor for progression. Check out: 2:30   Dr. Sinda Du is Medical Director for [facility].

## 2018-04-14 ENCOUNTER — Ambulatory Visit: Payer: Medicare HMO | Admitting: Gastroenterology

## 2018-04-14 ENCOUNTER — Encounter: Payer: Self-pay | Admitting: Gastroenterology

## 2018-04-14 ENCOUNTER — Other Ambulatory Visit: Payer: Self-pay

## 2018-04-14 VITALS — BP 131/76 | HR 105 | Temp 97.1°F | Ht 65.0 in | Wt 152.8 lb

## 2018-04-14 DIAGNOSIS — K760 Fatty (change of) liver, not elsewhere classified: Secondary | ICD-10-CM

## 2018-04-14 DIAGNOSIS — R1115 Cyclical vomiting syndrome unrelated to migraine: Secondary | ICD-10-CM

## 2018-04-14 DIAGNOSIS — G43A0 Cyclical vomiting, not intractable: Secondary | ICD-10-CM | POA: Diagnosis not present

## 2018-04-14 DIAGNOSIS — R112 Nausea with vomiting, unspecified: Secondary | ICD-10-CM | POA: Insufficient documentation

## 2018-04-14 MED ORDER — PANTOPRAZOLE SODIUM 40 MG PO TBEC
40.0000 mg | DELAYED_RELEASE_TABLET | Freq: Every day | ORAL | 3 refills | Status: DC
Start: 2018-04-14 — End: 2019-02-02

## 2018-04-14 NOTE — Progress Notes (Addendum)
REVIEWED-NO ADDITIONAL RECOMMENDATIONS.  Primary Care Physician:  Glenda Chroman, MD Primary Gastroenterologist:  Dr. Oneida Alar   Chief Complaint  Patient presents with  . Nausea    daily  . Vomiting    happens about once a week or can go 2 weeks w/o episode    HPI:   Ashlee Martin is an 84 y.o. female presenting today at the request of [facility] secondary to N/V.   States she had projectile vomiting at night intermittently. Sometimes just nauseated. Notes intermittent N/V and will have a week or two without any symptoms then will have recurrence. Year ago was 168 range. Now 152. When she feels well she will eat and gain a few pounds. When nauseated won't eat for a few days and only drink ginger ale and water. Notes gas but not significant pain. Had been on Esbriet 3 pills TID, started 4 weeks ago. Now decreased to 2 pills TID per pulmonologist. Intermittent solid food and liquid dysphagia "just once in awhile". Even with water. Occasional constipation and will take a stool softener.    Colonoscopy about 2 years ago at [facility]. Reportedly normal per patient.   Aspirin 325 mg daily. Prilosec 20 mg each evening.   N/V has been going on for a few years.   Past Medical History:  Diagnosis Date  . Hypercholesterolemia   . Hypertension   . ILD (interstitial lung disease) ([place])   . Psoriasis    had been on Humira but continued to have UTIs, sinus infections, etc. Taken off  . Seizures ([place])     Past Surgical History:  Procedure Laterality Date  . DILATION AND CURETTAGE OF UTERUS    . VIDEO BRONCHOSCOPY Bilateral 01/07/2018   Procedure: VIDEO BRONCHOSCOPY WITHOUT FLUORO;  Surgeon: Brand Males, MD;  Location: [facility];  Service: Cardiopulmonary;  Laterality: Bilateral;  . WRIST SURGERY      Current Outpatient Medications  Medication Sig Dispense Refill  . aspirin (GOODSENSE ASPIRIN) 325 MG tablet Take 325 mg by mouth at bedtime.     Marland Kitchen atenolol (TENORMIN) 50 MG  tablet Take 25 mg by mouth 2 (two) times daily.     Marland Kitchen dextromethorphan-guaiFENesin (MUCINEX DM) 30-600 MG 12hr tablet Take 1 tablet by mouth See admin instructions. Take 1 tablet daily in the morning, may repeat dose in the evening if needed for congestion.    Marland Kitchen doxylamine, Sleep, (UNISOM) 25 MG tablet Take 25 mg by mouth at bedtime.     . ondansetron (ZOFRAN) 4 MG tablet TAKE 1 TABLET (4 MG TOTAL) BY MOUTH 3 (THREE) TIMES DAILY. (Patient taking differently: Take 4 mg by mouth 3 (three) times daily as needed for nausea or vomiting. ) 30 tablet 3  . Pirfenidone (ESBRIET) 267 MG TABS Take 2 tablets by mouth 3 (three) times daily.     Vladimir Faster Glycol-Propyl Glycol (SYSTANE OP) Place 1 drop into both eyes 3 (three) times daily as needed (dry/irritated eyes.).     Marland Kitchen rOPINIRole (REQUIP) 2 MG tablet Take 2 mg by mouth at bedtime.     . simvastatin (ZOCOR) 20 MG tablet Take 20 mg by mouth at bedtime.     . VENTOLIN HFA 108 (90 Base) MCG/ACT inhaler Inhale 1-2 puffs into the lungs every 6 (six) hours as needed (chest congestion/cough).   0  . vitamin B-12 (CYANOCOBALAMIN) 1000 MCG tablet Take 1,000 mcg by mouth at bedtime.     . Ginger, Zingiber officinalis, (GINGER ROOT) 550 MG CAPS Take 550  mg by mouth daily as needed (for nausea.).    Marland Kitchen Histamine Dihydrochloride (AUSTRALIAN DREAM ARTHRITIS EX) Apply 1 application topically 3 (three) times daily as needed (for pain.).    Marland Kitchen naproxen sodium (ALEVE) 220 MG tablet Take 440 mg by mouth 2 (two) times daily as needed (for pain.).    Marland Kitchen pantoprazole (PROTONIX) 40 MG tablet Take 1 tablet (40 mg total) by mouth daily. 30 minutes before breakfast 90 tablet 3   No current facility-administered medications for this visit.     Allergies as of 04/14/2018 - Review Complete 04/14/2018  Allergen Reaction Noted  . Humira [adalimumab] Other (See Comments) 11/03/2017    Family History  Problem Relation Age of Onset  . Heart disease Mother   . Heart disease Father     . Cancer Sister        GYN  . Colon cancer Neg Hx     Social History   Socioeconomic History  . Marital status: Married    Spouse name: Not on file  . Number of children: Not on file  . Years of education: Not on file  . Highest education level: Not on file  Occupational History  . Occupation: Retired  Scientific laboratory technician  . Financial resource strain: Not on file  . Food insecurity:    Worry: Not on file    Inability: Not on file  . Transportation needs:    Medical: Not on file    Non-medical: Not on file  Tobacco Use  . Smoking status: Former Smoker    Packs/day: 3.00    Years: 20.00    Pack years: 60.00    Types: Cigarettes    Last attempt to quit: 11/03/1972    Years since quitting: 45.4  . Smokeless tobacco: Never Used  Substance and Sexual Activity  . Alcohol use: Yes    Alcohol/week: 1.0 standard drinks    Types: 1 Shots of liquor per week    Comment: May have before bed  . Drug use: Never  . Sexual activity: Not on file  Lifestyle  . Physical activity:    Days per week: Not on file    Minutes per session: Not on file  . Stress: Not on file  Relationships  . Social connections:    Talks on phone: Not on file    Gets together: Not on file    Attends religious service: Not on file    Active member of club or organization: Not on file    Attends meetings of clubs or organizations: Not on file    Relationship status: Not on file  . Intimate partner violence:    Fear of current or ex partner: Not on file    Emotionally abused: Not on file    Physically abused: Not on file    Forced sexual activity: Not on file  Other Topics Concern  . Not on file  Social History Narrative  . Not on file    Review of Systems: Gen: Denies any fever, chills, fatigue, weight loss, lack of appetite.  CV: Denies chest pain, heart palpitations, peripheral edema, syncope.  Resp: Denies shortness of breath at rest or with exertion. Denies wheezing or cough.  GI: see HPI . GU :  Denies urinary burning, urinary frequency, urinary hesitancy MS: Denies joint pain, muscle weakness, cramps, or limitation of movement.  Derm: Denies rash, itching, dry skin Psych: Denies depression, anxiety, memory loss, and confusion Heme: Denies bruising, bleeding, and enlarged lymph nodes.  Physical  Exam: BP 131/76   Pulse (!) 105   Temp (!) 97.1 F (36.2 C) (Oral)   Ht _0  (1.651 m)   Wt 152 lb 12.8 oz (69.3 kg)   BMI 25.43 kg/m  General:   Alert and oriented. Pleasant and cooperative. Well-nourished and well-developed. Appears younger than stated age.  Head:  Normocephalic and atraumatic. Eyes:  Without icterus, sclera clear and conjunctiva pink.  Ears:  Normal auditory acuity. Nose:  No deformity, discharge,  or lesions. Mouth:  No deformity or lesions, oral mucosa pink.  Lungs:  Bilateral crackles and rales, history of ISLD, no wheezing or distress  Heart:  S1, S2 present without murmurs appreciated.  Abdomen:  +BS, soft, non-tender and non-distended. ?Hepatomegaly Rectal:  Deferred  Msk:  Symmetrical without gross deformities. Normal posture. Extremities:  Without  edema. Neurologic:  Alert and  oriented x4 Psych:  Alert and cooperative. Normal mood and affect.   CT abdomen with contrast May 2018 at [facility]: diffuse hepatic steatosis, no definite morphologic changes of cirrhosis, small venous varix anterior to the left liver lobe.    US abdomen Aug 2017: no gallstones. Liver normal. Spleen normal.   Lab Results  Component Value Date   ALT 14 03/15/2018   AST 20 03/15/2018   ALKPHOS 73 03/15/2018   BILITOT 0.8 03/15/2018

## 2018-04-14 NOTE — Patient Instructions (Addendum)
I am requesting any outside imaging you have done and your last colonoscopy.  I recommend stopping the aspirin. Stop Prilosec. I have sent in Protonix to take once each morning, 30 minutes before breakfast.  We have scheduled you for an Rubi endoscopy and possible dilation with Dr. Oneida Alar in the near future!  It was a pleasure to see you today. I strive to create trusting relationships with patients to provide genuine, compassionate, and quality care. I value your feedback. If you receive a survey regarding your visit,  I greatly appreciate you taking time to fill this out.   Annitta Needs, PhD, ANP-BC [facility]

## 2018-04-15 ENCOUNTER — Encounter (HOSPITAL_COMMUNITY)
Admission: RE | Admit: 2018-04-15 | Discharge: 2018-04-15 | Disposition: A | Discharge status: Home / Self Care | Payer: Medicare HMO | Source: Ambulatory Visit | Attending: Internal Medicine | Admitting: Internal Medicine

## 2018-04-15 ENCOUNTER — Telehealth: Payer: Self-pay

## 2018-04-15 DIAGNOSIS — J849 Interstitial pulmonary disease, unspecified: Secondary | ICD-10-CM | POA: Diagnosis not present

## 2018-04-15 DIAGNOSIS — Z7982 Long term (current) use of aspirin: Secondary | ICD-10-CM | POA: Diagnosis not present

## 2018-04-15 DIAGNOSIS — Z87891 Personal history of nicotine dependence: Secondary | ICD-10-CM | POA: Diagnosis not present

## 2018-04-15 DIAGNOSIS — Z79899 Other long term (current) drug therapy: Secondary | ICD-10-CM | POA: Diagnosis not present

## 2018-04-15 NOTE — Telephone Encounter (Signed)
Called and informed pt of pre-op appt 04/19/18 at 10:00am.

## 2018-04-15 NOTE — Progress Notes (Signed)
Daily Session Note  Patient Details  Name: Ashlee Martin MRN: [id number] Date of Birth: [birth year] Referring Provider:     PULMONARY REHAB OTHER RESP ORIENTATION from 04/08/2018 in [place]  Referring Provider  Ramaswamy      Encounter Date: 04/15/2018  Check In: Session Check In - 04/15/18 1330      Check-In   Supervising physician immediately available to respond to emergencies  See telemetry face sheet for immediately available MD    Location  [facility]    Staff Present  Russella Dar, MS, EP, [facility], Exercise Physiologist;Other;Kanan Sobek Wynetta Emery, RN, BSN    Medication changes reported      No    Fall or balance concerns reported     No    Warm-up and Cool-down  Performed as group-led instruction    Resistance Training Performed  Yes    VAD Patient?  No    PAD/SET Patient?  No      Pain Assessment   Currently in Pain?  No/denies    Pain Score  0-No pain    Multiple Pain Sites  No       Capillary Blood Glucose: No results found for this or any previous visit (from the past 24 hour(s)).    Social History   Tobacco Use  Smoking Status Former Smoker  . Packs/day: 3.00  . Years: 20.00  . Pack years: 60.00  . Types: Cigarettes  . Last attempt to quit: 11/03/1972  . Years since quitting: 45.4  Smokeless Tobacco Never Used    Goals Met:  Proper associated with RPD/PD & O2 Sat Independence with exercise equipment Improved SOB with ADL's Using PLB without cueing & demonstrates good technique Exercise tolerated well No report of cardiac concerns or symptoms Strength training completed today  Goals Unmet:  Not Applicable  Comments: Pt able to follow exercise prescription today without complaint.  Will continue to monitor for progression. Check out 1430.   Dr. Sinda Du is Medical Director for [facility].

## 2018-04-15 NOTE — Patient Instructions (Signed)
Ashlee Martin  04/15/2018     _0 @   Your procedure is scheduled on  04/20/2018 .  Report to [facility] at  745   A.M.  Call this number if you have problems the morning of surgery:  [phone]   Remember:  Do not eat or drink after midnight.  You may drink clear liquids until  (follow the instructions given to you) .  Clear liquids allowed are:                    Water, Juice (non-citric and without pulp), Carbonated beverages, Clear Tea, Black Coffee only, Plain Jell-O only, Gatorade and Plain Popsicles only    Take these medicines the morning of surgery with A SIP OF WATER  Atenolol, prilosec or protonix, zofran, pirfenidone. Use your inhaler before you come.    Do not wear jewelry, make-up or nail polish.  Do not wear lotions, powders, or perfumes, or deodorant.  Do not shave 48 hours prior to surgery.  Men may shave face and neck.  Do not bring valuables to the hospital.  [facility] is not responsible for any belongings or valuables.  Contacts, dentures or bridgework may not be worn into surgery.  Leave your suitcase in the car.  After surgery it may be brought to your room.  For patients admitted to the hospital, discharge time will be determined by your treatment team.  Patients discharged the day of surgery will not be allowed to drive home.   Name and phone number of your driver:   family Special instructions:  Follow the diet instructions given to you by Dr Nona Dell office.  Please read over the following fact sheets that you were given. Anesthesia Post-op Instructions and Care and Recovery After Surgery       Esophagogastroduodenoscopy Esophagogastroduodenoscopy (EGD) is a procedure to examine the lining of the esophagus, stomach, and first part of the small intestine (duodenum). This procedure is done to check for problems such as inflammation, bleeding, ulcers, or growths. During this procedure, a long, flexible, lighted  tube with a camera attached (endoscope) is inserted down the throat. Tell a health care provider about:  Any allergies you have.  All medicines you are taking, including vitamins, herbs, eye drops, creams, and over-the-counter medicines.  Any problems you or family members have had with anesthetic medicines.  Any blood disorders you have.  Any surgeries you have had.  Any medical conditions you have.  Whether you are pregnant or may be pregnant. What are the risks? Generally, this is a safe procedure. However, problems may occur, including:  Infection.  Bleeding.  A tear (perforation) in the esophagus, stomach, or duodenum.  Trouble breathing.  Excessive sweating.  Spasms of the larynx.  A slowed heartbeat.  Low blood pressure.  What happens before the procedure?  Follow instructions from your health care provider about eating or drinking restrictions.  Ask your health care provider about: ? Changing or stopping your regular medicines. This is especially important if you are taking diabetes medicines or blood thinners. ? Taking medicines such as aspirin and ibuprofen. These medicines can thin your blood. Do not take these medicines before your procedure if your health care provider instructs you not to.  Plan to have someone take you home after the procedure.  If you wear dentures, be ready to remove them before the procedure. What happens during the procedure?  To  reduce your risk of infection, your health care team will wash or sanitize their hands.  An IV tube will be put in a vein in your hand or arm. You will get medicines and fluids through this tube.  You will be given one or more of the following: ? A medicine to help you relax (sedative). ? A medicine to numb the area (local anesthetic). This medicine may be sprayed into your throat. It will make you feel more comfortable and keep you from gagging or coughing during the procedure. ? A medicine for  pain.  A mouth guard may be placed in your mouth to protect your teeth and to keep you from biting on the endoscope.  You will be asked to lie on your left side.  The endoscope will be lowered down your throat into your esophagus, stomach, and duodenum.  Air will be put into the endoscope. This will help your health care provider see better.  The lining of your esophagus, stomach, and duodenum will be examined.  Your health care provider may: ? Take a tissue sample so it can be looked at in a lab (biopsy). ? Remove growths. ? Remove objects (foreign bodies) that are stuck. ? Treat any bleeding with medicines or other devices that stop tissue from bleeding. ? Widen (dilate) or stretch narrowed areas of your esophagus and stomach.  The endoscope will be taken out. The procedure may vary among health care providers and hospitals. What happens after the procedure?  Your blood pressure, heart rate, breathing rate, and blood oxygen level will be monitored often until the medicines you were given have worn off.  Do not eat or drink anything until the numbing medicine has worn off and your gag reflex has returned. This information is not intended to replace advice given to you by your health care provider. Make sure you discuss any questions you have with your health care provider. Document Released: 12/12/2004 Document Revised: 01/17/2016 Document Reviewed: 07/05/2015 Elsevier Interactive Patient Education  2018 [employer]. Esophagogastroduodenoscopy, Care After Refer to this sheet in the next few weeks. These instructions provide you with information about caring for yourself after your procedure. Your health care provider may also give you more specific instructions. Your treatment has been planned according to current medical practices, but problems sometimes occur. Call your health care provider if you have any problems or questions after your procedure. What can I expect after the  procedure? After the procedure, it is common to have:  A sore throat.  Nausea.  Bloating.  Dizziness.  Fatigue.  Follow these instructions at home:  Do not eat or drink anything until the numbing medicine (local anesthetic) has worn off and your gag reflex has returned. You will know that the local anesthetic has worn off when you can swallow comfortably.  Do not drive for 24 hours if you received a medicine to help you relax (sedative).  If your health care provider took a tissue sample for testing during the procedure, make sure to get your test results. This is your responsibility. Ask your health care provider or the department performing the test when your results will be ready.  Keep all follow-up visits as told by your health care provider. This is important. Contact a health care provider if:  You cannot stop coughing.  You are not urinating.  You are urinating less than usual. Get help right away if:  You have trouble swallowing.  You cannot eat or drink.  You have  throat or chest pain that gets worse.  You are dizzy or light-headed.  You faint.  You have nausea or vomiting.  You have chills.  You have a fever.  You have severe abdominal pain.  You have black, tarry, or bloody stools. This information is not intended to replace advice given to you by your health care provider. Make sure you discuss any questions you have with your health care provider. Document Released: 07/28/2012 Document Revised: 01/17/2016 Document Reviewed: 07/05/2015 Elsevier Interactive Patient Education  2018 [employer].  Esophageal Dilatation Esophageal dilatation is a procedure to open a blocked or narrowed part of the esophagus. The esophagus is the long tube in your throat that carries food and liquid from your mouth to your stomach. The procedure is also called esophageal dilation. You may need this procedure if you have a buildup of scar tissue in your esophagus that  makes it difficult, painful, or even impossible to swallow. This can be caused by gastroesophageal reflux disease (GERD). In rare cases, people need this procedure because they have cancer of the esophagus or a problem with the way food moves through the esophagus. Sometimes you may need to have another dilatation to enlarge the opening of the esophagus gradually. Tell a health care provider about:  Any allergies you have.  All medicines you are taking, including vitamins, herbs, eye drops, creams, and over-the-counter medicines.  Any problems you or family members have had with anesthetic medicines.  Any blood disorders you have.  Any surgeries you have had.  Any medical conditions you have.  Any antibiotic medicines you are required to take before dental procedures. What are the risks? Generally, this is a safe procedure. However, problems can occur and include:  Bleeding from a tear in the lining of the esophagus.  A hole (perforation) in the esophagus.  What happens before the procedure?  Do not eat or drink anything after midnight on the night before the procedure or as directed by your health care provider.  Ask your health care provider about changing or stopping your regular medicines. This is especially important if you are taking diabetes medicines or blood thinners.  Plan to have someone take you home after the procedure. What happens during the procedure?  You will be given a medicine that makes you relaxed and sleepy (sedative).  A medicine may be sprayed or gargled to numb the back of the throat.  Your health care provider can use various instruments to do an esophageal dilatation. During the procedure, the instrument used will be placed in your mouth and passed down into your esophagus. Options include: ? Simple dilators. This instrument is carefully placed in the esophagus to stretch it. ? Guided wire bougies. In this method, a flexible tube (endoscope) is used  to insert a wire into the esophagus. The dilator is passed over this wire to enlarge the esophagus. Then the wire is removed. ? Balloon dilators. An endoscope with a small balloon at the end is passed down into the esophagus. Inflating the balloon gently stretches the esophagus and opens it up. What happens after the procedure?  Your blood pressure, heart rate, breathing rate, and blood oxygen level will be monitored often until the medicines you were given have worn off.  Your throat may feel slightly sore and will probably still feel numb. This will improve slowly over time.  You will not be allowed to eat or drink until the throat numbness has resolved.  If this is a same-day  procedure, you may be allowed to go home once you have been able to drink, urinate, and sit on the edge of the bed without nausea or dizziness.  If this is a same-day procedure, you should have a friend or family member with you for the next 24 hours after the procedure. This information is not intended to replace advice given to you by your health care provider. Make sure you discuss any questions you have with your health care provider. Document Released: 10/02/2005 Document Revised: 01/17/2016 Document Reviewed: 12/21/2013 Elsevier Interactive Patient Education  2018 [place] Anesthesia is a term that refers to techniques, procedures, and medicines that help a person stay safe and comfortable during a medical procedure. Monitored anesthesia care, or sedation, is one type of anesthesia. Your anesthesia specialist may recommend sedation if you will be having a procedure that does not require you to be unconscious, such as:  Cataract surgery.  A dental procedure.  A biopsy.  A colonoscopy.  During the procedure, you may receive a medicine to help you relax (sedative). There are three levels of sedation:  Mild sedation. At this level, you may feel awake and relaxed. You will be  able to follow directions.  Moderate sedation. At this level, you will be sleepy. You may not remember the procedure.  Deep sedation. At this level, you will be asleep. You will not remember the procedure.  The more medicine you are given, the deeper your level of sedation will be. Depending on how you respond to the procedure, the anesthesia specialist may change your level of sedation or the type of anesthesia to fit your needs. An anesthesia specialist will monitor you closely during the procedure. Let your health care provider know about:  Any allergies you have.  All medicines you are taking, including vitamins, herbs, eye drops, creams, and over-the-counter medicines.  Any use of steroids (by mouth or as a cream).  Any problems you or family members have had with sedatives and anesthetic medicines.  Any blood disorders you have.  Any surgeries you have had.  Any medical conditions you have, such as sleep apnea.  Whether you are pregnant or may be pregnant.  Any use of cigarettes, alcohol, or street drugs. What are the risks? Generally, this is a safe procedure. However, problems may occur, including:  Getting too much medicine (oversedation).  Nausea.  Allergic reaction to medicines.  Trouble breathing. If this happens, a breathing tube may be used to help with breathing. It will be removed when you are awake and breathing on your own.  Heart trouble.  Lung trouble.  Before the procedure Staying hydrated Follow instructions from your health care provider about hydration, which may include:  Up to 2 hours before the procedure - you may continue to drink clear liquids, such as water, clear fruit juice, black coffee, and plain tea.  Eating and drinking restrictions Follow instructions from your health care provider about eating and drinking, which may include:  8 hours before the procedure - stop eating heavy meals or foods such as meat, fried foods, or fatty  foods.  6 hours before the procedure - stop eating light meals or foods, such as toast or cereal.  6 hours before the procedure - stop drinking milk or drinks that contain milk.  2 hours before the procedure - stop drinking clear liquids.  Medicines Ask your health care provider about:  Changing or stopping your regular medicines. This is especially important if  you are taking diabetes medicines or blood thinners.  Taking medicines such as aspirin and ibuprofen. These medicines can thin your blood. Do not take these medicines before your procedure if your health care provider instructs you not to.  Tests and exams  You will have a physical exam.  You may have blood tests done to show: ? How well your kidneys and liver are working. ? How well your blood can clot.  General instructions  Plan to have someone take you home from the hospital or clinic.  If you will be going home right after the procedure, plan to have someone with you for 24 hours.  What happens during the procedure?  Your blood pressure, heart rate, breathing, level of pain and overall condition will be monitored.  An IV tube will be inserted into one of your veins.  Your anesthesia specialist will give you medicines as needed to keep you comfortable during the procedure. This may mean changing the level of sedation.  The procedure will be performed. After the procedure  Your blood pressure, heart rate, breathing rate, and blood oxygen level will be monitored until the medicines you were given have worn off.  Do not drive for 24 hours if you received a sedative.  You may: ? Feel sleepy, clumsy, or nauseous. ? Feel forgetful about what happened after the procedure. ? Have a sore throat if you had a breathing tube during the procedure. ? Vomit. This information is not intended to replace advice given to you by your health care provider. Make sure you discuss any questions you have with your health care  provider. Document Released: 05/07/2005 Document Revised: 01/18/2016 Document Reviewed: 12/02/2015 Elsevier Interactive Patient Education  2018 [place], Care After These instructions provide you with information about caring for yourself after your procedure. Your health care provider may also give you more specific instructions. Your treatment has been planned according to current medical practices, but problems sometimes occur. Call your health care provider if you have any problems or questions after your procedure. What can I expect after the procedure? After your procedure, it is common to:  Feel sleepy for several hours.  Feel clumsy and have poor balance for several hours.  Feel forgetful about what happened after the procedure.  Have poor judgment for several hours.  Feel nauseous or vomit.  Have a sore throat if you had a breathing tube during the procedure.  Follow these instructions at home: For at least 24 hours after the procedure:   Do not: ? Participate in activities in which you could fall or become injured. ? Drive. ? Use heavy machinery. ? Drink alcohol. ? Take sleeping pills or medicines that cause drowsiness. ? Make important decisions or sign legal documents. ? Take care of children on your own.  Rest. Eating and drinking  Follow the diet that is recommended by your health care provider.  If you vomit, drink water, juice, or soup when you can drink without vomiting.  Make sure you have little or no nausea before eating solid foods. General instructions  Have a responsible adult stay with you until you are awake and alert.  Take over-the-counter and prescription medicines only as told by your health care provider.  If you smoke, do not smoke without supervision.  Keep all follow-up visits as told by your health care provider. This is important. Contact a health care provider if:  You keep feeling nauseous or you  keep vomiting.  You  feel light-headed.  You develop a rash.  You have a fever. Get help right away if:  You have trouble breathing. This information is not intended to replace advice given to you by your health care provider. Make sure you discuss any questions you have with your health care provider. Document Released: 12/02/2015 Document Revised: 04/02/2016 Document Reviewed: 12/02/2015 Elsevier Interactive Patient Education  [employer].

## 2018-04-16 ENCOUNTER — Telehealth: Payer: Self-pay | Admitting: Gastroenterology

## 2018-04-16 ENCOUNTER — Encounter: Payer: Self-pay | Admitting: Gastroenterology

## 2018-04-16 NOTE — Telephone Encounter (Signed)
[facility] clinical pool: can we arrange US abdomen complete due to history of fatty liver, ?hepatomegaly? I have already placed the order.

## 2018-04-16 NOTE — Assessment & Plan Note (Addendum)
84 year old female appearing younger than stated age, presenting with intermittent N/V chronically (for years) reported weight loss, no abdominal pain. Intermittent solid and liquid dysphagia. Has been on aspirin 325 mg daily and taking Prilosec with food in evenings. She has no heart history or history of prior stroke to her knowledge. I have asked her to hold the aspirin for now, we will stop Prilosec and start Protonix once each morning before breakfast. Will pursue diagnostic EGD with dilation as appropriate. As of note, last colonoscopy approximately 2 years ago at [facility], which I am requesting and reportedly normal per patient. She has had CT last year at [facility] with diffuse fatty liver with recommendations to consider elastography. Most recent LFTs normal. She does have a round AP diameter and ?hepatomegaly. Will order routine US abdomen complete as well.   Proceed with Stachnik endoscopy +/- dilation in the near future with Dr. Oneida Alar. The risks, benefits, and alternatives have been discussed in detail with patient. They have stated understanding and desire to proceed.  Propofol due to comorbidities US abdomen in future Stop aspirin, stop prilosec, start protonix

## 2018-04-19 ENCOUNTER — Encounter (HOSPITAL_COMMUNITY): Payer: Self-pay

## 2018-04-19 ENCOUNTER — Encounter (HOSPITAL_COMMUNITY)
Admission: RE | Admit: 2018-04-19 | Discharge: 2018-04-19 | Disposition: A | Discharge status: Home / Self Care | Payer: Medicare HMO | Source: Ambulatory Visit | Attending: Gastroenterology | Admitting: Gastroenterology

## 2018-04-19 ENCOUNTER — Other Ambulatory Visit: Payer: Self-pay

## 2018-04-19 DIAGNOSIS — Z7982 Long term (current) use of aspirin: Secondary | ICD-10-CM | POA: Diagnosis not present

## 2018-04-19 DIAGNOSIS — E7800 Pure hypercholesterolemia, unspecified: Secondary | ICD-10-CM | POA: Diagnosis not present

## 2018-04-19 DIAGNOSIS — I10 Essential (primary) hypertension: Secondary | ICD-10-CM | POA: Diagnosis not present

## 2018-04-19 DIAGNOSIS — K219 Gastro-esophageal reflux disease without esophagitis: Secondary | ICD-10-CM | POA: Diagnosis not present

## 2018-04-19 DIAGNOSIS — F329 Major depressive disorder, single episode, unspecified: Secondary | ICD-10-CM | POA: Diagnosis not present

## 2018-04-19 DIAGNOSIS — K2970 Gastritis, unspecified, without bleeding: Secondary | ICD-10-CM | POA: Diagnosis not present

## 2018-04-19 DIAGNOSIS — K222 Esophageal obstruction: Secondary | ICD-10-CM | POA: Diagnosis not present

## 2018-04-19 DIAGNOSIS — M1990 Unspecified osteoarthritis, unspecified site: Secondary | ICD-10-CM | POA: Diagnosis not present

## 2018-04-19 DIAGNOSIS — J849 Interstitial pulmonary disease, unspecified: Secondary | ICD-10-CM | POA: Diagnosis not present

## 2018-04-19 DIAGNOSIS — Z87891 Personal history of nicotine dependence: Secondary | ICD-10-CM | POA: Diagnosis not present

## 2018-04-19 DIAGNOSIS — R1310 Dysphagia, unspecified: Secondary | ICD-10-CM | POA: Diagnosis not present

## 2018-04-19 HISTORY — DX: Unspecified convulsions: R56.9

## 2018-04-19 HISTORY — DX: Gastro-esophageal reflux disease without esophagitis: K21.9

## 2018-04-19 HISTORY — DX: Major depressive disorder, single episode, unspecified: F32.9

## 2018-04-19 HISTORY — DX: Unspecified osteoarthritis, unspecified site: M19.90

## 2018-04-19 HISTORY — DX: Depression, unspecified: F32.A

## 2018-04-19 LAB — BASIC METABOLIC PANEL
Anion gap: 10 (ref 5–15)
BUN: 14 mg/dL (ref 8–23)
CALCIUM: 9 mg/dL (ref 8.9–10.3)
CO2: 24 mmol/L (ref 22–32)
CREATININE: 1.18 mg/dL — AB (ref 0.44–1.00)
Chloride: 103 mmol/L (ref 98–111)
GFR, EST AFRICAN AMERICAN: 48 mL/min — AB (ref >60)
GFR, EST NON AFRICAN AMERICAN: 41 mL/min — AB (ref >60)
Glucose, Bld: 115 mg/dL — ABNORMAL HIGH (ref 70–99)
Potassium: 3.5 mmol/L (ref 3.5–5.1)
SODIUM: 137 mmol/L (ref 135–145)

## 2018-04-19 LAB — CBC WITH DIFFERENTIAL/PLATELET
BASOS ABS: 0.1 10*3/uL (ref 0.0–0.1)
Basophils Relative: 1 %
EOS ABS: 0.6 10*3/uL (ref 0.0–0.7)
Eosinophils Relative: 8 %
HCT: 39.5 % (ref 36.0–46.0)
HEMOGLOBIN: 12.8 g/dL (ref 12.0–15.0)
LYMPHS PCT: 17 %
Lymphs Abs: 1.3 10*3/uL (ref 0.7–4.0)
MCH: 36.4 pg — ABNORMAL HIGH (ref 26.0–34.0)
MCHC: 32.4 g/dL (ref 30.0–36.0)
MCV: 112.2 fL — ABNORMAL HIGH (ref 78.0–100.0)
Monocytes Absolute: 1.1 10*3/uL — ABNORMAL HIGH (ref 0.1–1.0)
Monocytes Relative: 15 %
NEUTROS PCT: 59 %
Neutro Abs: 4.3 10*3/uL (ref 1.7–7.7)
PLATELETS: 249 10*3/uL (ref 150–400)
RBC: 3.52 MIL/uL — ABNORMAL LOW (ref 3.87–5.11)
RDW: 13.4 % (ref 11.5–15.5)
WBC: 7.4 10*3/uL (ref 4.0–10.5)

## 2018-04-19 NOTE — Telephone Encounter (Signed)
Patient called back and is aware of the appt details. Nothing further needed

## 2018-04-19 NOTE — Telephone Encounter (Signed)
U/S scheduled for 04/22/18 at 8:30am, arrival time 8:15am, NPO after midnight. LMOVM for pt

## 2018-04-19 NOTE — Progress Notes (Signed)
cc'ed to pcp °

## 2018-04-20 ENCOUNTER — Ambulatory Visit (HOSPITAL_COMMUNITY): Payer: Medicare HMO | Admitting: Anesthesiology

## 2018-04-20 ENCOUNTER — Ambulatory Visit (HOSPITAL_COMMUNITY)
Admission: RE | Admit: 2018-04-20 | Discharge: 2018-04-20 | Disposition: A | Discharge status: Home / Self Care | Payer: Medicare HMO | Source: Ambulatory Visit | Attending: Gastroenterology | Admitting: Gastroenterology

## 2018-04-20 ENCOUNTER — Encounter (HOSPITAL_COMMUNITY): Payer: Self-pay | Admitting: *Deleted

## 2018-04-20 ENCOUNTER — Encounter (HOSPITAL_COMMUNITY): Payer: Medicare HMO

## 2018-04-20 ENCOUNTER — Encounter (HOSPITAL_COMMUNITY)
Admission: RE | Disposition: A | Discharge status: Home / Self Care | Payer: Self-pay | Source: Ambulatory Visit | Attending: Gastroenterology

## 2018-04-20 DIAGNOSIS — R112 Nausea with vomiting, unspecified: Secondary | ICD-10-CM

## 2018-04-20 DIAGNOSIS — J849 Interstitial pulmonary disease, unspecified: Secondary | ICD-10-CM | POA: Insufficient documentation

## 2018-04-20 DIAGNOSIS — Z7982 Long term (current) use of aspirin: Secondary | ICD-10-CM | POA: Insufficient documentation

## 2018-04-20 DIAGNOSIS — M1990 Unspecified osteoarthritis, unspecified site: Secondary | ICD-10-CM | POA: Insufficient documentation

## 2018-04-20 DIAGNOSIS — E7800 Pure hypercholesterolemia, unspecified: Secondary | ICD-10-CM | POA: Diagnosis not present

## 2018-04-20 DIAGNOSIS — I10 Essential (primary) hypertension: Secondary | ICD-10-CM | POA: Insufficient documentation

## 2018-04-20 DIAGNOSIS — F329 Major depressive disorder, single episode, unspecified: Secondary | ICD-10-CM | POA: Insufficient documentation

## 2018-04-20 DIAGNOSIS — R1310 Dysphagia, unspecified: Secondary | ICD-10-CM | POA: Diagnosis not present

## 2018-04-20 DIAGNOSIS — K219 Gastro-esophageal reflux disease without esophagitis: Secondary | ICD-10-CM | POA: Insufficient documentation

## 2018-04-20 DIAGNOSIS — K222 Esophageal obstruction: Secondary | ICD-10-CM | POA: Diagnosis not present

## 2018-04-20 DIAGNOSIS — Z87891 Personal history of nicotine dependence: Secondary | ICD-10-CM | POA: Insufficient documentation

## 2018-04-20 DIAGNOSIS — K2970 Gastritis, unspecified, without bleeding: Secondary | ICD-10-CM | POA: Diagnosis not present

## 2018-04-20 DIAGNOSIS — R1115 Cyclical vomiting syndrome unrelated to migraine: Secondary | ICD-10-CM

## 2018-04-20 HISTORY — PX: ESOPHAGOGASTRODUODENOSCOPY (EGD) WITH PROPOFOL: SHX5813

## 2018-04-20 HISTORY — PX: SAVORY DILATION: SHX5439

## 2018-04-20 HISTORY — PX: BIOPSY: SHX5522

## 2018-04-20 SURGERY — ESOPHAGOGASTRODUODENOSCOPY (EGD) WITH PROPOFOL
Anesthesia: General

## 2018-04-20 MED ORDER — PROPOFOL 500 MG/50ML IV EMUL
INTRAVENOUS | Status: DC | PRN
  Administered 2018-04-20: 150 ug/kg/min via INTRAVENOUS

## 2018-04-20 MED ORDER — CHLORHEXIDINE GLUCONATE CLOTH 2 % EX PADS: 6.0000 | MEDICATED_PAD | Freq: Once | CUTANEOUS | Status: DC

## 2018-04-20 MED ORDER — FENTANYL CITRATE (PF) 100 MCG/2ML IJ SOLN: 25.0000 ug | INTRAMUSCULAR | Status: DC | PRN

## 2018-04-20 MED ORDER — PROPOFOL 10 MG/ML IV BOLUS
INTRAVENOUS | Status: DC | PRN
  Administered 2018-04-20 (×2): 10 mg via INTRAVENOUS

## 2018-04-20 MED ORDER — LIDOCAINE VISCOUS HCL 2 % MT SOLN: 10.0000 mL | Freq: Once | OROMUCOSAL | Status: DC

## 2018-04-20 MED ORDER — LACTATED RINGERS IV SOLN
INTRAVENOUS | Status: DC
  Administered 2018-04-20: 09:00:00 via INTRAVENOUS

## 2018-04-20 MED ORDER — KETAMINE HCL 10 MG/ML IJ SOLN
INTRAMUSCULAR | Status: DC | PRN
  Administered 2018-04-20 (×3): 5 mg via INTRAVENOUS

## 2018-04-20 MED ORDER — HYDROCODONE-ACETAMINOPHEN 7.5-325 MG PO TABS: 1.0000 | ORAL_TABLET | Freq: Once | ORAL | Status: DC | PRN

## 2018-04-20 NOTE — Discharge Instructions (Signed)
I dilated your esophagus. You have a stricture near the base of your esophagus LIKELY DUE TO UNCONTROLLED REFLUX.Marland Kitchen  You have gastritis due to aspirin and naproxen. I biopsied your stomach.   YOUR KIDNEY FUNCTION IS NOT NORMAL. YOU SHOULD STRICTLY AVOID IBUPROFEN/MOTRIN, OR NAPROXEN/ALEVE. It increases your chances of needing dialysis because it can cause kidney failure.   DRINK WATER TO KEEP YOUR URINE LIGHT YELLOW.  AVOID REFLUX TRIGGERS. SEE INFO BELOW.  STRICTLY FOLLOW A LOW FAT DIET. SEE INFO BELOW.  CONTINUE PROTONIX. TAKE 30 MINUTES PRIOR TO BREAKFAST.   YOUR BIOPSY WILL BE BACK IN 7 DAYS  Please CALL IN ONE MONTH IF YOUR SYMPTOMS ARE NOT IMPROVED.   FOLLOW UP IN 4 MOS.  [facility] AFTER CARE Read the instructions outlined below and refer to this sheet in the next week. These discharge instructions provide you with general information on caring for yourself after you leave the hospital. While your treatment has been planned according to the most current medical practices available, unavoidable complications occasionally occur. If you have any problems or questions after discharge, call DR. Lebert Lovern, [phone].  ACTIVITY  You may resume your regular activity, but move at a slower pace for the next 24 hours.   Take frequent rest periods for the next 24 hours.   Walking will help get rid of the air and reduce the bloated feeling in your belly (abdomen).   No driving for 24 hours (because of the medicine (anesthesia) used during the test).   You may shower.   Do not sign any important legal documents or operate any machinery for 24 hours (because of the anesthesia used during the test).    NUTRITION  Drink plenty of fluids.   You may resume your normal diet as instructed by your doctor.   Begin with a light meal and progress to your normal diet. Heavy or fried foods are harder to digest and may make you feel sick to your stomach (nauseated).   Avoid alcoholic  beverages for 24 hours or as instructed.    MEDICATIONS  You may resume your normal medications.   WHAT YOU CAN EXPECT TODAY  Some feelings of bloating in the abdomen.   Passage of more gas than usual.    IF YOU HAD A BIOPSY TAKEN DURING THE [facility]:  Eat a soft diet IF YOU HAVE NAUSEA, BLOATING, ABDOMINAL PAIN, OR VOMITING.    FINDING OUT THE RESULTS OF YOUR TEST Not all test results are available during your visit. DR. Oneida Alar WILL CALL YOU WITHIN 14 DAYS OF YOUR PROCEDUE WITH YOUR RESULTS. Do not assume everything is normal if you have not heard from DR. Sonya Gunnoe, CALL HER OFFICE AT [phone].  SEEK IMMEDIATE MEDICAL ATTENTION AND CALL THE OFFICE: [phone] IF:  You have more than a spotting of blood in your stool.   Your belly is swollen (abdominal distention).   You are nauseated or vomiting.   You have a temperature over 101F.   You have abdominal pain or discomfort that is severe or gets worse throughout the day.  Gastritis  Gastritis is an inflammation (the body's way of reacting to injury and/or infection) of the stomach. It is often caused by viral or bacterial (germ) infections. It can also be caused BY ASPIRIN, BC/GOODY POWDER'S, (IBUPROFEN) MOTRIN, OR ALEVE (NAPROXEN), chemicals (including alcohol), SPICY FOODS, and medications. This illness may be associated with generalized malaise (feeling tired, not well), [place] ABDOMINAL STOMACH cramps, and fever. One common bacterial cause  of gastritis is an organism known as H. Pylori. This can be treated with antibiotics.   ESOPHAGEAL STRICTURE  Esophageal strictures can be caused by stomach acid backing up into the tube that carries food from the mouth down to the stomach (lower esophagus).  TREATMENT There are a number of  medicines used to treat reflux/stricture, including: Antacids.  Proton-pump inhibitors: PROTONIX PEPCID OR ZANTAC    Lifestyle and home remedies TO CONTROL REFLUX/HEARTBURN You  may eliminate or reduce the frequency of heartburn by making the following lifestyle changes:   Control your weight. Being overweight is a major risk factor for heartburn and GERD. Excess pounds put pressure on your abdomen, pushing up your stomach and causing acid to back up into your esophagus.    Eat smaller meals. 4 TO 6 MEALS A DAY. This reduces pressure on the lower esophageal sphincter, helping to prevent the valve from opening and acid from washing back into your esophagus.    Loosen your belt. Clothes that fit tightly around your waist put pressure on your abdomen and the lower esophageal sphincter.    Eliminate heartburn triggers. Everyone has specific triggers. Common triggers such as fatty or fried foods, spicy food, tomato sauce, carbonated beverages, alcohol, chocolate, mint, garlic, onion, caffeine and nicotine may make heartburn worse.    Avoid stooping or bending. Tying your shoes is OK. Bending over for longer periods to weed your garden isn't, especially soon after eating.    Don't lie down after a meal. Wait at least three to four hours after eating before going to bed, and don't lie down right after eating.    Alternative medicine  Several home remedies exist for treating GERD, but they provide only temporary relief. They include drinking baking soda (sodium bicarbonate) added to water or drinking other fluids such as baking soda mixed with cream of tartar and water.   Although these liquids create temporary relief by neutralizing, washing away or buffering acids, eventually they aggravate the situation by adding gas and fluid to your stomach, increasing pressure and causing more acid reflux. Further, adding more sodium to your diet may increase your blood pressure and add stress to your heart, and excessive bicarbonate ingestion can alter the acid-base balance in your body.    Low-Fat Diet  BREADS, CEREALS, PASTA, RICE, DRIED PEAS, AND BEANS  These products  are high in carbohydrates and most are low in fat. Therefore, they can be increased in the diet as substitutes for fatty foods. They too, however, contain calories and should not be eaten in excess. Cereals can be eaten for snacks as well as for breakfast.   Include foods that contain fiber (fruits, vegetables, whole grains, and legumes). Research shows that fiber may lower blood cholesterol levels, especially the water-soluble fiber found in fruits, vegetables, oat products, and legumes.  FRUITS AND VEGETABLES  It is good to eat fruits and vegetables. Besides being sources of fiber, both are rich in vitamins and some minerals. They help you get the daily allowances of these nutrients. Fruits and vegetables can be used for snacks and desserts.  MEATS  Limit lean meat, chicken, [place], and fish to no more than 6 ounces per day.  Beef, Pork, and Lamb  Use lean cuts of beef, pork, and lamb. Lean cuts include:   Extra-lean ground beef.   Arm roast.   Sirloin tip.   Center-cut ham.   Round steak.   Loin chops.   Rump roast.   Tenderloin.  Trim all fat off the outside of meats before cooking. It is not necessary to severely decrease the intake of red meat, but lean choices should be made. Lean meat is rich in protein and contains a highly absorbable form of iron. Premenopausal women, in particular, should avoid reducing lean red meat because this could increase the risk for low red blood cells (iron-deficiency anemia).  Processed Meats  Processed meats, such as bacon, bologna, salami, sausage, and hot dogs contain large quantities of fat, are not rich in valuable nutrients, and should not be eaten very often.  Organ Meats  The organ meats, such as liver, sweetbreads, kidneys, and brain are very rich in cholesterol. They should be limited.  Chicken and [place]  These are good sources of protein. The fat of poultry can be reduced by removing the skin and underlying fat layers  before cooking. Chicken and [place] can be substituted for lean red meat in the diet. Poultry should not be fried or covered with high-fat sauces.  Fish and Shellfish  Fish is a good source of protein. Shellfish contain cholesterol, but they usually are low in saturated fatty acids. The preparation of fish is important. Like chicken and [place], they should not be fried or covered with high-fat sauces.  EGGS  Egg yolks often are hidden in cooked and processed foods. Egg whites contain no fat or cholesterol. They can be eaten often. Try 1 to 2 egg whites instead of whole eggs in recipes or use egg substitutes that do not contain yolk.  MILK AND DAIRY PRODUCTS  Use skim or 1% milk instead of 2% or whole milk. Decrease whole milk, natural, and processed cheeses. Use nonfat or low-fat (2%) cottage cheese or low-fat cheeses made from vegetable oils. Choose nonfat or low-fat (1 to 2%) yogurt. Experiment with evaporated skim milk in recipes that call for heavy cream. Substitute low-fat yogurt or low-fat cottage cheese for sour cream in dips and salad dressings. Have at least 2 servings of low-fat dairy products, such as 2 glasses of skim (or 1%) milk each day to help get your daily calcium intake.  FATS AND OILS  Reduce the total intake of fats, especially saturated fat. Butterfat, lard, and beef fats are high in saturated fat and cholesterol. These should be avoided as much as possible. Vegetable fats do not contain cholesterol, but certain vegetable fats, such as coconut oil, palm oil, and palm kernel oil are very high in saturated fats. These should be limited. These fats are often used in bakery goods, processed foods, popcorn, oils, and nondairy creamers. Vegetable shortenings and some peanut butters contain hydrogenated oils, which are also saturated fats. Read the labels on these foods and check for saturated vegetable oils.  Unsaturated vegetable oils and fats do not raise blood cholesterol.  However, they should be limited because they are fats and are high in calories. Total fat should still be limited to 30% of your daily caloric intake. Desirable liquid vegetable oils are corn oil, cottonseed oil, olive oil, canola oil, safflower oil, soybean oil, and sunflower oil. Peanut oil is not as good, but small amounts are acceptable. Buy a heart-healthy tub margarine that has no partially hydrogenated oils in the ingredients. Mayonnaise and salad dressings often are made from unsaturated fats, but they should also be limited because of their high calorie and fat content.  Seeds, nuts, peanut butter, olives, and avocados are high in fat, but the fat is mainly the unsaturated type. These foods should be  limited mainly to avoid excess calories and fat.  OTHER EATING TIPS  Snacks   Most sweets should be limited as snacks. They tend to be rich in calories and fats, and their caloric content outweighs their nutritional value. Some good choices in snacks are graham crackers, melba toast, soda crackers, bagels (no egg), English muffins, fruits, and vegetables. These snacks are preferable to snack crackers, [place] fries, and chips. Popcorn should be air-popped or cooked in small amounts of liquid vegetable oil.  Desserts  Eat fruit, low-fat yogurt, and fruit ices instead of pastries, cake, and cookies. Sherbet, angel food cake, gelatin dessert, frozen low-fat yogurt, or other frozen products that do not contain saturated fat (pure fruit juice bars, frozen ice pops) are also acceptable.   COOKING METHODS  Choose those methods that use little or no fat. They include:  Poaching.   Braising.   Steaming.   Grilling.   Baking.   Stir-frying.   Broiling.   Microwaving.   Foods can be cooked in a nonstick pan without added fat, or use a nonfat cooking spray in regular cookware. Limit fried foods and avoid frying in saturated fat. Add moisture to lean meats by using water, broth, cooking  wines, and other nonfat or low-fat sauces along with the cooking methods mentioned above.  Soups and stews should be chilled after cooking. The fat that forms on top after a few hours in the refrigerator should be skimmed off. When preparing meals, avoid using excess salt. Salt can contribute to raising blood pressure in some people.  EATING AWAY FROM HOME  Order entres, potatoes, and vegetables without sauces or butter. When meat exceeds the size of a deck of cards (3 to 4 ounces), the rest can be taken home for another meal.  Choose vegetable or fruit salads and ask for low-calorie salad dressings to be served on the side. Use dressings sparingly. Limit high-fat toppings, such as bacon, crumbled eggs, cheese, sunflower seeds, and olives. Ask for heart-healthy tub margarine instead of butter.

## 2018-04-20 NOTE — Addendum Note (Signed)
Addendum  created 04/20/18 1021 by Ollen Bowl, CRNA   Sign clinical note

## 2018-04-20 NOTE — H&P (Signed)
Primary Care Physician:  Glenda Chroman, MD Primary Gastroenterologist:  Dr. Oneida Alar  Pre-Procedure History & Physical: HPI:  Ashlee Martin is a 84 y.o. female here for Ashlee Martin.  Past Medical History:  Diagnosis Date  . Arthritis   . Depression   . GERD (gastroesophageal reflux disease)   . Hypercholesterolemia   . Hypertension   . ILD (interstitial lung disease) ([place])   . Psoriasis    had been on Humira but continued to have UTIs, sinus infections, etc. Taken off  . Seizure ([place])    had 3-4 seizures, 8 years ago, placed on medication and has had no seizures since.  . Seizures ([place])     Past Surgical History:  Procedure Laterality Date  . DILATION AND CURETTAGE OF UTERUS     x3  . VIDEO BRONCHOSCOPY Bilateral 01/07/2018   Procedure: VIDEO BRONCHOSCOPY WITHOUT FLUORO;  Surgeon: Brand Males, MD;  Location: [facility];  Service: Cardiopulmonary;  Laterality: Bilateral;  . WRIST SURGERY Right    ORIF    Prior to Admission medications   Medication Sig Start Date End Date Taking? Authorizing Provider  aspirin (GOODSENSE ASPIRIN) 325 MG tablet Take 325 mg by mouth at bedtime.    Yes [provider]  atenolol (TENORMIN) 50 MG tablet Take 25 mg by mouth 2 (two) times daily.  10/21/17  Yes [provider]  dextromethorphan-guaiFENesin (MUCINEX DM) 30-600 MG 12hr tablet Take 1 tablet by mouth See admin instructions. Take 1 tablet daily in the morning, may repeat dose in the evening if needed for congestion.   Yes [provider]  doxylamine, Sleep, (UNISOM) 25 MG tablet Take 25 mg by mouth at bedtime.    Yes [provider]  Ginger, Zingiber officinalis, (GINGER ROOT) 550 MG CAPS Take 550 mg by mouth daily as needed (for nausea.).   Yes [provider]  Histamine Dihydrochloride (AUSTRALIAN DREAM ARTHRITIS EX) Apply 1 application topically 3 (three) times daily as needed (for pain.).   Yes [provider]  naproxen sodium  (ALEVE) 220 MG tablet Take 440 mg by mouth 2 (two) times daily as needed (for pain.).   Yes [provider]  ondansetron (ZOFRAN) 4 MG tablet TAKE 1 TABLET (4 MG TOTAL) BY MOUTH 3 (THREE) TIMES DAILY. Patient taking differently: Take 4 mg by mouth 3 (three) times daily as needed for nausea or vomiting.  03/24/18  Yes Brand Males, MD  pantoprazole (PROTONIX) 40 MG tablet Take 1 tablet (40 mg total) by mouth daily. 30 minutes before breakfast 04/14/18  Yes Annitta Needs, NP  Pirfenidone (ESBRIET) 267 MG TABS Take 2 tablets by mouth 3 (three) times daily.    Yes [provider]  Polyethyl Glycol-Propyl Glycol (SYSTANE OP) Place 1 drop into both eyes 3 (three) times daily as needed (dry/irritated eyes.).    Yes [provider]  rOPINIRole (REQUIP) 2 MG tablet Take 2 mg by mouth at bedtime.  10/26/17  Yes [provider]  simvastatin (ZOCOR) 20 MG tablet Take 20 mg by mouth at bedtime.  09/01/17  Yes [provider]  VENTOLIN HFA 108 (90 Base) MCG/ACT inhaler Inhale 1-2 puffs into the lungs every 6 (six) hours as needed (chest congestion/cough).  09/27/17  Yes [provider]  vitamin B-12 (CYANOCOBALAMIN) 1000 MCG tablet Take 1,000 mcg by mouth at bedtime.    Yes [provider]    Allergies as of 04/14/2018 - Review Complete 04/14/2018  Allergen Reaction Noted  . Humira [adalimumab]  Other (See Comments) 11/03/2017    Family History  Problem Relation Age of Onset  . Heart disease Mother   . Heart disease Father   . Cancer Sister        GYN  . Colon cancer Neg Hx     Social History   Socioeconomic History  . Marital status: Married    Spouse name: Not on file  . Number of children: Not on file  . Years of education: Not on file  . Highest education level: Not on file  Occupational History  . Occupation: Retired  Scientific laboratory technician  . Financial resource strain: Not on file  . Food insecurity:    Worry: Not on file    Inability:  Not on file  . Transportation needs:    Medical: Not on file    Non-medical: Not on file  Tobacco Use  . Smoking status: Former Smoker    Packs/day: 3.00    Years: 20.00    Pack years: 60.00    Types: Cigarettes    Last attempt to quit: 11/03/1972    Years since quitting: 45.4  . Smokeless tobacco: Never Used  Substance and Sexual Activity  . Alcohol use: Yes    Alcohol/week: 7.0 standard drinks    Types: 7 Shots of liquor per week    Comment: May have before bed  . Drug use: Never  . Sexual activity: Yes    Birth control/protection: None  Lifestyle  . Physical activity:    Days per week: Not on file    Minutes per session: Not on file  . Stress: Not on file  Relationships  . Social connections:    Talks on phone: Not on file    Gets together: Not on file    Attends religious service: Not on file    Active member of club or organization: Not on file    Attends meetings of clubs or organizations: Not on file    Relationship status: Not on file  . Intimate partner violence:    Fear of current or ex partner: Not on file    Emotionally abused: Not on file    Physically abused: Not on file    Forced sexual activity: Not on file  Other Topics Concern  . Not on file  Social History Narrative  . Not on file    Review of Systems: See HPI, otherwise negative ROS   Physical Exam: BP 123/65   Temp 98.2 F (36.8 C) (Oral)   Resp (!) 24   SpO2 90%  General:   Alert,  pleasant and cooperative in NAD Head:  Normocephalic and atraumatic. Neck:  Supple; Lungs:  Clear throughout to auscultation.    Heart:  Regular rate and rhythm. Abdomen:  Soft, nontender and nondistended. Normal bowel sounds, without guarding, and without rebound.   Neurologic:  Alert and  oriented x4;  grossly normal neurologically.  Impression/Plan:     DYSPHAGIA  PLAN:  EGD/DIL TODAY. DISCUSSED PROCEDURE, BENEFITS, & RISKS: < 1% chance of medication reaction, bleeding, OR perforation.

## 2018-04-20 NOTE — Anesthesia Postprocedure Evaluation (Addendum)
Anesthesia Post Note  Patient: Ashlee Martin  Procedure(s) Performed: ESOPHAGOGASTRODUODENOSCOPY (EGD) WITH PROPOFOL (N/A ) SAVORY DILATION (N/A ) BIOPSY  Patient location during evaluation: PACU Anesthesia Type: MAC Level of consciousness: awake and alert and oriented Pain management: pain level controlled Vital Signs Assessment: post-procedure vital signs reviewed and stable Respiratory status: spontaneous breathing Cardiovascular status: blood pressure returned to baseline and stable Postop Assessment: no apparent nausea or vomiting Anesthetic complications: no     Last Vitals:  Vitals:   04/20/18 0813 04/20/18 0815  BP: 123/65 123/65  Resp: 18 (!) 24  Temp: 36.8 C   SpO2: (!) 87% 90%    Last Pain:  Vitals:   04/20/18 0941  TempSrc:   PainSc: 0-No pain                 Fount Bahe

## 2018-04-20 NOTE — Anesthesia Preprocedure Evaluation (Signed)
Anesthesia Evaluation  Patient identified by MRN, date of birth, ID band Patient awake    Reviewed: Allergy & Precautions, NPO status , Patient's Chart, lab work & pertinent test results  Airway Mallampati: II  TM Distance: >3 FB Neck ROM: Full    Dental no notable dental hx.    Pulmonary neg pulmonary ROS, former smoker,  Known ILD with RA sats ~88-90 at home per pt -  States no home o2 use   Diffuse crackles throughout bilat, no wheeze appreciated  Pulmonary exam normal    rales    Cardiovascular Exercise Tolerance: Poor hypertension, Pt. on medications and Pt. on home beta blockers negative cardio ROS Normal cardiovascular examII Rhythm:Regular Rate:Normal  Denies any cardiac issues - ET limited by pulm issues    Neuro/Psych Seizures -,  Depression negative neurological ROS  negative psych ROS   GI/Hepatic negative GI ROS, Neg liver ROS, GERD  Medicated and Controlled,Denies GERD SX today    Endo/Other  negative endocrine ROS  Renal/GU negative Renal ROS  negative genitourinary   Musculoskeletal negative musculoskeletal ROS (+) Arthritis , Osteoarthritis,    Abdominal   Peds negative pediatric ROS (+)  Hematology negative hematology ROS (+)   Anesthesia Other Findings   Reproductive/Obstetrics negative OB ROS                             Anesthesia Physical Anesthesia Plan  ASA: IV  Anesthesia Plan: General   Post-op Pain Management:    Induction: Intravenous  PONV Risk Score and Plan:   Airway Management Planned: Simple Face Mask and Nasal Cannula  Additional Equipment:   Intra-op Plan:   Post-operative Plan: Extubation in OR  Informed Consent: I have reviewed the patients History and Physical, chart, labs and discussed the procedure including the risks, benefits and alternatives for the proposed anesthesia with the patient or authorized representative who has  indicated his/her understanding and acceptance.   Dental advisory given  Plan Discussed with: CRNA  Anesthesia Plan Comments: (Plan to attempt without ETT)        Anesthesia Quick Evaluation

## 2018-04-20 NOTE — Transfer of Care (Addendum)
Immediate Anesthesia Transfer of Care Note  Patient: Ashlee Martin  Procedure(s) Performed: ESOPHAGOGASTRODUODENOSCOPY (EGD) WITH PROPOFOL (N/A ) SAVORY DILATION (N/A ) BIOPSY  Patient Location: PACU  Anesthesia Type:MAC  Level of Consciousness: awake and alert   Airway & Oxygen Therapy: Patient Spontanous Breathing  Post-op Assessment: Report given to RN  Post vital signs: Reviewed and stable  Last Vitals:  Vitals Value Taken Time  BP 81/50 04/20/2018 10:05 AM  Temp    Pulse 73 04/20/2018 10:07 AM  Resp 14 04/20/2018 10:07 AM  SpO2 98 % 04/20/2018 10:07 AM  Vitals shown include unvalidated device data.  Last Pain:  Vitals:   04/20/18 0941  TempSrc:   PainSc: 0-No pain      Patients Stated Pain Goal: 7 (11/57/26 2035)  Complications: No apparent anesthesia complications and Patient re-intubated

## 2018-04-20 NOTE — Op Note (Signed)
[facility] Patient Name: Ashlee Martin Procedure Date: 04/20/2018 9:22 AM MRN: [id number] Date of Birth: 1930/09/01 Attending MD: Barney Drain MD, MD CSN: [id number] Age: 84 Admit Type: Outpatient Procedure:                [facility] endoscopy WITH COLD FORCEPS                            BIOPSY/ESOPHAGEAL DILATION Indications:              Dysphagia, Nausea with vomiting Providers:                Barney Drain MD, MD, Otis Peak B. Sharon Seller, RN,                            Randa Spike, Technician Referring MD:             Glenda Chroman Medicines:                Propofol per Anesthesia Complications:            No immediate complications. Estimated Blood Loss:     Estimated blood loss was minimal. Procedure:                Pre-Anesthesia Assessment:                           - Prior to the procedure, a History and Physical                            was performed, and patient medications and                            allergies were reviewed. The patient's tolerance of                            previous anesthesia was also reviewed. The risks                            and benefits of the procedure and the sedation                            options and risks were discussed with the patient.                            All questions were answered, and informed consent                            was obtained. Prior Anticoagulants: The patient                            last took aspirin 7 days and naproxen 1 day prior                            to the procedure. ASA Grade Assessment: II - A  patient with mild systemic disease. After reviewing                            the risks and benefits, the patient was deemed in                            satisfactory condition to undergo the procedure.                           After obtaining informed consent, the endoscope was                            passed under direct vision. Throughout the        procedure, the patient's blood pressure, pulse, and                            oxygen saturations were monitored continuously. The                            GIF-H190 ([id number]) scope was introduced through the                            mouth, and advanced to the second part of duodenum.                            The [facility] endoscopy was accomplished without                            difficulty. The patient tolerated the procedure                            well. Scope In: 9:47:59 AM Scope Out: 0:25:42 AM Total Procedure Duration: 0 hours 8 minutes 9 seconds  Findings:      One benign-appearing, intrinsic mild stenosis was found. This stenosis       measured 1.4 cm (inner diameter). The stenosis was traversed. A       guidewire was placed and the scope was withdrawn. Dilation was performed       with a Savary dilator with mild resistance at 15 mm, 16 mm and 17 mm.      Patchy mild inflammation characterized by congestion (edema) and       erythema was found in the entire examined stomach. Biopsies(2: BODY, 3:       ANTRUM) were taken with a cold forceps for Helicobacter pylori testing.      The pylorus was normal, BUT HAD CHANGES CONSISTENT WITH PRIOR ULCER       DISEASE.      The examined duodenum was normal. CHANGES AT D1/D2 CONSISTENT WITH PRIOR       ULCER DISEASE. Impression:               - DYSPHAGIA LIKELY DUE TO PEPTIC STRICTURE                           - NAUSEA/VOMITING MOST LIKELY DUE TO GERD. Moderate Sedation:      Per Anesthesia Care  Recommendation:           - Patient has a contact number available for                            emergencies. The signs and symptoms of potential                            delayed complications were discussed with the                            patient. Return to normal activities tomorrow.                            Written discharge instructions were provided to the                            patient.                           -  Low fat diet. AVOID REFLUX TRIGGERS.                           - Continue present medications. STOP NAPROXEN. OK                            TO USE ASA.                           - Await pathology results.                           - CALL IN ONE MONTH IF SYMPTOMS AREN'T IMPROVED. IF                            DYSPHAGIA PERISTS EREFR FOR MANOMETRY. IF                            NAUSEA/VOMITING PERSISTS, WILL NEED GES. OTHERWISE,                            Return to my office in 4 months. . Procedure Code(s):        --- Professional ---                           [phone], Esophagogastroduodenoscopy, flexible,                            transoral; with insertion of guide wire followed by                            passage of dilator(s) through esophagus over guide                            wire  52080, Esophagogastroduodenoscopy, flexible,                            transoral; with biopsy, single or multiple Diagnosis Code(s):        --- Professional ---                           K22.2, Esophageal obstruction                           K29.70, Gastritis, unspecified, without bleeding                           R13.10, Dysphagia, unspecified                           R11.2, Nausea with vomiting, unspecified CPT copyright 2017 [employer]. All rights reserved. The codes documented in this report are preliminary and upon coder review may  be revised to meet current compliance requirements. Barney Drain, MD Barney Drain MD, MD 04/20/2018 10:11:07 AM This report has been signed electronically. Number of Addenda: 0

## 2018-04-22 ENCOUNTER — Encounter (HOSPITAL_COMMUNITY): Payer: Medicare HMO

## 2018-04-22 ENCOUNTER — Encounter (HOSPITAL_COMMUNITY): Payer: Self-pay | Admitting: Gastroenterology

## 2018-04-22 ENCOUNTER — Ambulatory Visit (HOSPITAL_COMMUNITY)
Admission: RE | Admit: 2018-04-22 | Discharge: 2018-04-22 | Disposition: A | Discharge status: Home / Self Care | Payer: Medicare HMO | Source: Ambulatory Visit | Attending: Gastroenterology | Admitting: Gastroenterology

## 2018-04-22 DIAGNOSIS — N2889 Other specified disorders of kidney and ureter: Secondary | ICD-10-CM | POA: Diagnosis not present

## 2018-04-22 DIAGNOSIS — K760 Fatty (change of) liver, not elsewhere classified: Secondary | ICD-10-CM | POA: Insufficient documentation

## 2018-04-22 NOTE — Progress Notes (Signed)
Pulmonary Individual Treatment Plan  Patient Details  Name: Ashlee Martin MRN: [id number] Date of Birth: 1930-09-24 Referring Provider:     PULMONARY REHAB OTHER RESP ORIENTATION from 04/08/2018 in [facility]  Referring Provider  [place]      Initial Encounter Date:    PULMONARY REHAB OTHER RESP ORIENTATION from 04/08/2018 in [facility]  Date  04/08/18      Visit Diagnosis: ILD (interstitial lung disease) ([place])  Patient's Home Medications on Admission:   Current Outpatient Medications:  .  aspirin (GOODSENSE ASPIRIN) 325 MG tablet, Take 325 mg by mouth at bedtime. , Disp: , Rfl:  .  atenolol (TENORMIN) 50 MG tablet, Take 25 mg by mouth 2 (two) times daily. , Disp: , Rfl:  .  dextromethorphan-guaiFENesin (MUCINEX DM) 30-600 MG 12hr tablet, Take 1 tablet by mouth See admin instructions. Take 1 tablet daily in the morning, may repeat dose in the evening if needed for congestion., Disp: , Rfl:  .  doxylamine, Sleep, (UNISOM) 25 MG tablet, Take 25 mg by mouth at bedtime. , Disp: , Rfl:  .  Ginger, Zingiber officinalis, (GINGER ROOT) 550 MG CAPS, Take 550 mg by mouth daily as needed (for nausea.)., Disp: , Rfl:  .  Histamine Dihydrochloride (AUSTRALIAN DREAM ARTHRITIS EX), Apply 1 application topically 3 (three) times daily as needed (for pain.)., Disp: , Rfl:  .  naproxen sodium (ALEVE) 220 MG tablet, Take 440 mg by mouth 2 (two) times daily as needed (for pain.)., Disp: , Rfl:  .  ondansetron (ZOFRAN) 4 MG tablet, TAKE 1 TABLET (4 MG TOTAL) BY MOUTH 3 (THREE) TIMES DAILY. (Patient taking differently: Take 4 mg by mouth 3 (three) times daily as needed for nausea or vomiting. ), Disp: 30 tablet, Rfl: 3 .  pantoprazole (PROTONIX) 40 MG tablet, Take 1 tablet (40 mg total) by mouth daily. 30 minutes before breakfast, Disp: 90 tablet, Rfl: 3 .  Pirfenidone (ESBRIET) 267 MG TABS, Take 2 tablets by mouth 3 (three) times daily. , Disp: , Rfl:  .   Polyethyl Glycol-Propyl Glycol (SYSTANE OP), Place 1 drop into both eyes 3 (three) times daily as needed (dry/irritated eyes.). , Disp: , Rfl:  .  rOPINIRole (REQUIP) 2 MG tablet, Take 2 mg by mouth at bedtime. , Disp: , Rfl:  .  simvastatin (ZOCOR) 20 MG tablet, Take 20 mg by mouth at bedtime. , Disp: , Rfl:  .  VENTOLIN HFA 108 (90 Base) MCG/ACT inhaler, Inhale 1-2 puffs into the lungs every 6 (six) hours as needed (chest congestion/cough). , Disp: , Rfl: 0 .  vitamin B-12 (CYANOCOBALAMIN) 1000 MCG tablet, Take 1,000 mcg by mouth at bedtime. , Disp: , Rfl:   Past Medical History: Past Medical History:  Diagnosis Date  . Arthritis   . Depression   . GERD (gastroesophageal reflux disease)   . Hypercholesterolemia   . Hypertension   . ILD (interstitial lung disease) ([place])   . Psoriasis    had been on Humira but continued to have UTIs, sinus infections, etc. Taken off  . Seizure ([place])    had 3-4 seizures, 8 years ago, placed on medication and has had no seizures since.  . Seizures ([place])     Tobacco Use: Social History   Tobacco Use  Smoking Status Former Smoker  . Packs/day: 3.00  . Years: 20.00  . Pack years: 60.00  . Types: Cigarettes  . Last attempt to quit: 11/03/1972  . Years since quitting: 23.4  Smokeless Tobacco Never Used    Labs: Recent Review Flowsheet Data    There is no flowsheet data to display.      Capillary Blood Glucose: No results found for: GLUCAP   Pulmonary Assessment Scores: Pulmonary Assessment Scores    Row Name 04/08/18 1617         ADL UCSD   ADL Phase  Entry     SOB Score total  41     Rest  0     Walk  8     Stairs  1     Bath  1     Dress  1     Shop  3       CAT Score   CAT Score  16       mMRC Score   mMRC Score  3        Pulmonary Function Assessment: Pulmonary Function Assessment - 04/08/18 1616      Pulmonary Function Tests   FVC%  58 %    FEV1%  62 %    FEV1/FVC Ratio  108    RV%  35 %    DLCO%  30 %       Initial Spirometry Results   FVC%  58 %    FEV1%  62 %    FEV1/FVC Ratio  108      Breath   Bilateral Breath Sounds  Clear    Shortness of Breath  Yes   Mostly during walk test.       Exercise Target Goals: Exercise Program Goal: Individual exercise prescription set using results from initial 6 min walk test and THRR while considering  patient's activity barriers and safety.   Exercise Prescription Goal: Initial exercise prescription builds to 30-45 minutes a day of aerobic activity, 2-3 days per week.  Home exercise guidelines will be given to patient during program as part of exercise prescription that the participant will acknowledge.  Activity Barriers & Risk Stratification:   6 Minute Walk: 6 Minute Walk    Row Name 04/08/18 1537         6 Minute Walk   Phase  Initial     Distance  800 feet     Walk Time  6 minutes     # of Rest Breaks  1     MPH  1.51     METS  2.16     RPE  13     Perceived Dyspnea   13     Resting HR  68 bpm     Resting BP  110/62     Resting Oxygen Saturation   91 %     Exercise Oxygen Saturation  during 6 min walk  88 %     Max Ex. HR  93 bpm     Max Ex. BP  130/64     2 Minute Post BP  124/62        Oxygen Initial Assessment: Oxygen Initial Assessment - 04/08/18 1615      Home Oxygen   Home Oxygen Device  None    Sleep Oxygen Prescription  None    Home Exercise Oxygen Prescription  None    Home at Rest Exercise Oxygen Prescription  None    Compliance with Home Oxygen Use  --   N/A     Initial 6 min Walk   Oxygen Used  None      Program Oxygen Prescription   Program Oxygen Prescription  None       Oxygen Re-Evaluation:   Oxygen Discharge (Final Oxygen Re-Evaluation):   Initial Exercise Prescription: Initial Exercise Prescription - 04/08/18 1500      Date of Initial Exercise RX and Referring Provider   Date  04/08/18    Referring Provider  Ramaswamy    Expected Discharge Date  07/24/18      Treadmill   MPH  0.8     Grade  0    Minutes  17    METs  1.6      NuStep   Level  1    SPM  42    Minutes  22    METs  1.7      Prescription Details   Frequency (times per week)  2    Duration  Progress to 30 minutes of continuous aerobic without signs/symptoms of physical distress      Intensity   THRR 40-80% of Max Heartrate  102-113-125    Ratings of Perceived Exertion  11-13    Perceived Dyspnea  0-4      Progression   Progression  Continue progressive overload as per policy without signs/symptoms or physical distress.      Resistance Training   Training Prescription  Yes    Weight  1    Reps  10-15       Perform Capillary Blood Glucose checks as needed.  Exercise Prescription Changes:  Exercise Prescription Changes    Row Name 04/22/18 0700             Response to Exercise   Blood Pressure (Admit)  132/60       Blood Pressure (Exercise)  152/68       Blood Pressure (Exit)  136/60       Heart Rate (Admit)  68 bpm       Heart Rate (Exercise)  96 bpm       Heart Rate (Exit)  76 bpm       Oxygen Saturation (Admit)  90 %       Oxygen Saturation (Exercise)  97 %       Oxygen Saturation (Exit)  93 %       Rating of Perceived Exertion (Exercise)  11       Perceived Dyspnea (Exercise)  11       Symptoms  - SOB       Duration  Progress to 30 minutes of  aerobic without signs/symptoms of physical distress       Intensity  THRR New [phone]         Progression   Progression  Continue to progress workloads to maintain intensity without signs/symptoms of physical distress.       Average METs  1.98         Resistance Training   Training Prescription  No       Weight  1       Reps  10-15       Time  5 Minutes         Treadmill   MPH  0.8       Grade  0       Minutes  17       METs  1.6         NuStep   Level  1       SPM  62       Minutes  22       METs  1.6  Exercise Comments:  Exercise Comments    Row Name 04/22/18 0802           Exercise Comments   Patient is not feeling her best due to her stomach issues. She wants to come and wants to get stronger.           Exercise Goals and Review:  Exercise Goals    Row Name 04/08/18 1553             Exercise Goals   Increase Physical Activity  Yes       Intervention  Provide advice, education, support and counseling about physical activity/exercise needs.;Develop an individualized exercise prescription for aerobic and resistive training based on initial evaluation findings, risk stratification, comorbidities and participant's personal goals.       Expected Outcomes  Short Term: Attend rehab on a regular basis to increase amount of physical activity.;Long Term: Add in home exercise to make exercise part of routine and to increase amount of physical activity.       Increase Strength and Stamina  Yes       Intervention  Provide advice, education, support and counseling about physical activity/exercise needs.;Develop an individualized exercise prescription for aerobic and resistive training based on initial evaluation findings, risk stratification, comorbidities and participant's personal goals.       Expected Outcomes  Short Term: Increase workloads from initial exercise prescription for resistance, speed, and METs.;Long Term: Improve cardiorespiratory fitness, muscular endurance and strength as measured by increased METs and functional capacity (6MWT)       Able to understand and use rate of perceived exertion (RPE) scale  Yes       Intervention  Provide education and explanation on how to use RPE scale       Expected Outcomes  Short Term: Able to use RPE daily in rehab to express subjective intensity level;Long Term:  Able to use RPE to guide intensity level when exercising independently       Able to understand and use Dyspnea scale  Yes       Intervention  Provide education and explanation on how to use Dyspnea scale       Expected Outcomes  Short Term: Able to use Dyspnea scale daily in rehab  to express subjective sense of shortness of breath during exertion;Long Term: Able to use Dyspnea scale to guide intensity level when exercising independently       Knowledge and understanding of Target Heart Rate Range (THRR)  Yes       Intervention  Provide education and explanation of THRR including how the numbers were predicted and where they are located for reference       Expected Outcomes  Short Term: Able to use daily as guideline for intensity in rehab;Long Term: Able to use THRR to govern intensity when exercising independently       Able to check pulse independently  Yes       Intervention  Provide education and demonstration on how to check pulse in carotid and radial arteries.;Review the importance of being able to check your own pulse for safety during independent exercise       Expected Outcomes  Short Term: Able to explain why pulse checking is important during independent exercise;Long Term: Able to check pulse independently and accurately       Understanding of Exercise Prescription  Yes       Intervention  Provide education, explanation, and written materials on patient's individual exercise prescription  Expected Outcomes  Short Term: Able to explain program exercise prescription;Long Term: Able to explain home exercise prescription to exercise independently          Exercise Goals Re-Evaluation : Exercise Goals Re-Evaluation    Row Name 04/22/18 0752             Exercise Goal Re-Evaluation   Exercise Goals Review  Increase Physical Activity;Increase Strength and Stamina;Able to understand and use Dyspnea scale;Understanding of Exercise Prescription;Knowledge and understanding of Target Heart Rate Range (THRR)       Comments  Patient is still new to the program. She is having some stomach issues that makes it difficult for her to eat. When she comes she is sometimes weak because of not eating. She is seeing her doctor to determine what the problem is. She has not been  progressed yet due to having doctors appointments about her stomach. She does like coming to the program and hopes it will help her reach her goals.        Expected Outcomes  Get energy back, iIncrease appetite and continue to get stronger.           Discharge Exercise Prescription (Final Exercise Prescription Changes): Exercise Prescription Changes - 04/22/18 0700      Response to Exercise   Blood Pressure (Admit)  132/60    Blood Pressure (Exercise)  152/68    Blood Pressure (Exit)  136/60    Heart Rate (Admit)  68 bpm    Heart Rate (Exercise)  96 bpm    Heart Rate (Exit)  76 bpm    Oxygen Saturation (Admit)  90 %    Oxygen Saturation (Exercise)  97 %    Oxygen Saturation (Exit)  93 %    Rating of Perceived Exertion (Exercise)  11    Perceived Dyspnea (Exercise)  11    Symptoms  --   SOB   Duration  Progress to 30 minutes of  aerobic without signs/symptoms of physical distress    Intensity  THRR New   [phone]     Progression   Progression  Continue to progress workloads to maintain intensity without signs/symptoms of physical distress.    Average METs  1.98      Resistance Training   Training Prescription  No    Weight  1    Reps  10-15    Time  5 Minutes      Treadmill   MPH  0.8    Grade  0    Minutes  17    METs  1.6      NuStep   Level  1    SPM  62    Minutes  22    METs  1.6       Nutrition:  Target Goals: Understanding of nutrition guidelines, daily intake of sodium <1551m, cholesterol <2075m calories 30% from fat and 7% or less from saturated fats, daily to have 5 or more servings of fruits and vegetables.  Biometrics: Pre Biometrics - 04/08/18 1610      Pre Biometrics   Height  _0  (1.651 m)    Weight  69.3 kg    Waist Circumference  26 inches    Hip Circumference  29 inches    Waist to Hip Ratio  0.9 %    BMI (Calculated)  25.41    Triceps Skinfold  6 mm    % Body Fat  15 %    Grip Strength  32 kg  Flexibility  0 in    Single Leg  Stand  10 seconds        Nutrition Therapy Plan and Nutrition Goals: Nutrition Therapy & Goals - 04/08/18 1620      Personal Nutrition Goals   Personal Goal #2  Patient is trying to eat a heart healthy diet.        Nutrition Assessments: Nutrition Assessments - 04/08/18 1620      MEDFICTS Scores   Pre Score  27       Nutrition Goals Re-Evaluation:   Nutrition Goals Discharge (Final Nutrition Goals Re-Evaluation):   Psychosocial: Target Goals: Acknowledge presence or absence of significant depression and/or stress, maximize coping skills, provide positive support system. Participant is able to verbalize types and ability to use techniques and skills needed for reducing stress and depression.  Initial Review & Psychosocial Screening: Initial Psych Review & Screening - 04/08/18 1612      Initial Review   Current issues with  None Identified      Family Dynamics   Good Support System?  Yes      Barriers   Psychosocial barriers to participate in program  There are no identifiable barriers or psychosocial needs.      Screening Interventions   Interventions  Encouraged to exercise    Expected Outcomes  Short Term goal: Identification and review with participant of any Quality of Life or Depression concerns found by scoring the questionnaire.;Long Term goal: The participant improves quality of Life and PHQ9 Scores as seen by post scores and/or verbalization of changes       Quality of Life Scores: Quality of Life - 04/08/18 1613      Quality of Life   Select  Quality of Life      Quality of Life Scores   Health/Function Pre  20.38 %    Socioeconomic Pre  19.71 %    Psych/Spiritual Pre  20.14 %    Family Pre  20.6 %    GLOBAL Pre  20.23 %      Scores of 19 and below usually indicate a poorer quality of life in these areas.  A difference of  2-3 points is a clinically meaningful difference.  A difference of 2-3 points in the total score of the Quality of Life Index  has been associated with significant improvement in overall quality of life, self-image, physical symptoms, and general health in studies assessing change in quality of life.   PHQ-9: Recent Review Flowsheet Data    Depression screen [facility] 2/9 04/08/2018   Decreased Interest 2   Down, Depressed, Hopeless 1   PHQ - 2 Score 3   Altered sleeping 0   Tired, decreased energy 1   Change in appetite 1   Feeling bad or failure about yourself  1   Trouble concentrating 1   Moving slowly or fidgety/restless 0   Suicidal thoughts 0   PHQ-9 Score 7   Difficult doing work/chores Somewhat difficult     Interpretation of Total Score  Total Score Depression Severity:  1-4 = Minimal depression, 5-9 = Mild depression, 10-14 = Moderate depression, 15-19 = Moderately severe depression, 20-27 = Severe depression   Psychosocial Evaluation and Intervention: Psychosocial Evaluation - 04/08/18 1613      Psychosocial Evaluation & Interventions   Interventions  Encouraged to exercise with the program and follow exercise prescription    Continue Psychosocial Services   No Follow up required       Psychosocial Re-Evaluation:  Psychosocial Discharge (Final Psychosocial Re-Evaluation):    Education: Education Goals: Education classes will be provided on a weekly basis, covering required topics. Participant will state understanding/return demonstration of topics presented.  Learning Barriers/Preferences: Learning Barriers/Preferences - 04/08/18 1520      Learning Barriers/Preferences   Learning Barriers  None    Learning Preferences  Skilled Demonstration;Group Instruction;Verbal Instruction       Education Topics: How Lungs Work and Diseases: - Discuss the anatomy of the lungs and diseases that can affect the lungs, such as COPD.   Exercise: -Discuss the importance of exercise, FITT principles of exercise, normal and abnormal responses to exercise, and how to exercise  safely.   Environmental Irritants: -Discuss types of environmental irritants and how to limit exposure to environmental irritants.   Meds/Inhalers and oxygen: - Discuss respiratory medications, definition of an inhaler and oxygen, and the proper way to use an inhaler and oxygen.   PULMONARY REHAB OTHER RESPIRATORY from 04/15/2018 in [facility]  Date  04/15/18  Educator  D. Wynetta Emery      Energy Saving Techniques: - Discuss methods to conserve energy and decrease shortness of breath when performing activities of daily living.    Bronchial Hygiene / Breathing Techniques: - Discuss breathing mechanics, pursed-lip breathing technique,  proper posture, effective ways to clear airways, and other functional breathing techniques   Cleaning Equipment: - Provides group verbal and written instruction about the health risks of elevated stress, cause of high stress, and healthy ways to reduce stress.   Nutrition I: Fats: - Discuss the types of cholesterol, what cholesterol does to the body, and how cholesterol levels can be controlled.   Nutrition II: Labels: -Discuss the different components of food labels and how to read food labels.   Respiratory Infections: - Discuss the signs and symptoms of respiratory infections, ways to prevent respiratory infections, and the importance of seeking medical treatment when having a respiratory infection.   Stress I: Signs and Symptoms: - Discuss the causes of stress, how stress may lead to anxiety and depression, and ways to limit stress.   Stress II: Relaxation: -Discuss relaxation techniques to limit stress.   Oxygen for Home/Travel: - Discuss how to prepare for travel when on oxygen and proper ways to transport and store oxygen to ensure safety.   Knowledge Questionnaire Score: Knowledge Questionnaire Score - 04/08/18 1621      Knowledge Questionnaire Score   Pre Score  12/18       Core Components/Risk  Factors/Patient Goals at Admission: Personal Goals and Risk Factors at Admission - 04/08/18 1621      Core Components/Risk Factors/Patient Goals on Admission    Weight Management  Weight Maintenance    Personal Goal Other  Yes    Personal Goal  Gain energy, increase appetite, continue to get stronger through PR program    Intervention  Attend class 2 x week, supplement with 3 x week exercise at home.     Expected Outcomes  Reach personal goals.        Core Components/Risk Factors/Patient Goals Review:    Core Components/Risk Factors/Patient Goals at Discharge (Final Review):    ITP Comments: ITP Comments    Row Name 04/22/18 1555           ITP Comments  Patient is new to the program. She has completed 3 sessions. She has been out for this week due to having an endoscopy procedure done on 04/20/18 with 2 biopsy's taken and dilation of  esophagus with wide spread inflammation found of her stomach. Also had liver scan with fatty liver. Will have a follow up liver scan in 3 to 6 months. Will continue to monitor for progress.           Comments: ITP REVIEW Patient is new to the program. She has completed 3 sessions. She has been out for this week due to having an endoscopy procedure done on 04/20/18 with 2 biopsy's taken and dilation of esophagus with wide spread inflammation found of her stomach. Also had liver scan with fatty liver. Will have a follow up liver scan in 3 to 6 months. Will continue to monitor for progress.

## 2018-04-22 NOTE — Progress Notes (Signed)
PT is aware.

## 2018-04-27 ENCOUNTER — Telehealth: Payer: Self-pay | Admitting: Internal Medicine

## 2018-04-27 ENCOUNTER — Encounter (HOSPITAL_COMMUNITY): Payer: Medicare HMO

## 2018-04-27 ENCOUNTER — Other Ambulatory Visit (INDEPENDENT_AMBULATORY_CARE_PROVIDER_SITE_OTHER): Payer: Medicare HMO

## 2018-04-27 ENCOUNTER — Ambulatory Visit (INDEPENDENT_AMBULATORY_CARE_PROVIDER_SITE_OTHER): Payer: Medicare HMO | Admitting: Internal Medicine

## 2018-04-27 ENCOUNTER — Telehealth: Payer: Self-pay

## 2018-04-27 ENCOUNTER — Encounter: Payer: Self-pay | Admitting: Internal Medicine

## 2018-04-27 VITALS — BP 108/64 | HR 63 | Ht 65.0 in | Wt 148.4 lb

## 2018-04-27 DIAGNOSIS — Z5181 Encounter for therapeutic drug level monitoring: Secondary | ICD-10-CM

## 2018-04-27 DIAGNOSIS — T50905A Adverse effect of unspecified drugs, medicaments and biological substances, initial encounter: Secondary | ICD-10-CM

## 2018-04-27 DIAGNOSIS — G4734 Idiopathic sleep related nonobstructive alveolar hypoventilation: Secondary | ICD-10-CM | POA: Diagnosis not present

## 2018-04-27 DIAGNOSIS — J849 Interstitial pulmonary disease, unspecified: Secondary | ICD-10-CM

## 2018-04-27 DIAGNOSIS — R112 Nausea with vomiting, unspecified: Secondary | ICD-10-CM | POA: Diagnosis not present

## 2018-04-27 DIAGNOSIS — R5383 Other fatigue: Secondary | ICD-10-CM

## 2018-04-27 DIAGNOSIS — R634 Abnormal weight loss: Secondary | ICD-10-CM | POA: Diagnosis not present

## 2018-04-27 DIAGNOSIS — Z23 Encounter for immunization: Secondary | ICD-10-CM

## 2018-04-27 LAB — BASIC METABOLIC PANEL
BUN: 9 mg/dL (ref 6–23)
CHLORIDE: 103 meq/L (ref 96–112)
CO2: 21 meq/L (ref 19–32)
Calcium: 9.3 mg/dL (ref 8.4–10.5)
Creatinine, Ser: 1.12 mg/dL (ref 0.40–1.20)
GFR: 49.18 mL/min — AB (ref >60.00)
GLUCOSE: 113 mg/dL — AB (ref 70–99)
POTASSIUM: 3.2 meq/L — AB (ref 3.5–5.1)
SODIUM: 136 meq/L (ref 135–145)

## 2018-04-27 LAB — CBC WITH DIFFERENTIAL/PLATELET
BASOS PCT: 0.7 % (ref 0.0–3.0)
Basophils Absolute: 0.1 10*3/uL (ref 0.0–0.1)
EOS PCT: 5 % (ref 0.0–5.0)
Eosinophils Absolute: 0.5 10*3/uL (ref 0.0–0.7)
HCT: 40.2 % (ref 36.0–46.0)
Hemoglobin: 13.6 g/dL (ref 12.0–15.0)
LYMPHS ABS: 2.2 10*3/uL (ref 0.7–4.0)
Lymphocytes Relative: 24.9 % (ref 12.0–46.0)
MCHC: 33.8 g/dL (ref 30.0–36.0)
MCV: 107.8 fl — AB (ref 78.0–100.0)
MONOS PCT: 13.8 % — AB (ref 3.0–12.0)
Monocytes Absolute: 1.2 10*3/uL — ABNORMAL HIGH (ref 0.1–1.0)
NEUTROS PCT: 55.6 % (ref 43.0–77.0)
Neutro Abs: 5 10*3/uL (ref 1.4–7.7)
Platelets: 345 10*3/uL (ref 150.0–400.0)
RBC: 3.73 Mil/uL — AB (ref 3.87–5.11)
RDW: 14 % (ref 11.5–15.5)
WBC: 9 10*3/uL (ref 4.0–10.5)

## 2018-04-27 LAB — HEPATIC FUNCTION PANEL
ALBUMIN: 3.6 g/dL (ref 3.5–5.2)
ALK PHOS: 63 U/L (ref 39–117)
ALT: 11 U/L (ref 0–35)
AST: 17 U/L (ref 0–37)
BILIRUBIN DIRECT: 0.2 mg/dL (ref 0.0–0.3)
BILIRUBIN TOTAL: 0.6 mg/dL (ref 0.2–1.2)
Total Protein: 8.1 g/dL (ref 6.0–8.3)

## 2018-04-27 LAB — MAGNESIUM: Magnesium: 1.6 mg/dL (ref 1.5–2.5)

## 2018-04-27 LAB — PHOSPHORUS: Phosphorus: 3 mg/dL (ref 2.3–4.6)

## 2018-04-27 NOTE — Progress Notes (Signed)
Subjective:     Patient ID: Ashlee Martin, female   DOB: 1930/11/05, 84 y.o.   MRN: [id number]  HPI  PCP Glenda Chroman, MD  HPI  IOV 11/03/2017  Chief Complaint  Patient presents with  . Consult    Self referral. Pt states she was diagnosed with pulmonary fibrosis x4 weeks ago. States she has c/o cough with green mucus, mild SOB. Denies any CP.    84 year old female self-referred for evaluation of pulmonary fibrosis.  She is fairly good historian.  She tells me that she has diagnosis of psoriasis of the skin in 2016 and was under the care of Dr. Channing Mutters in [place].  She was on Humira for 11 months taking monthly injections but her rash got bad in November 2017 and then she was treated with prednisone.  She went on vacation to [place] and then returned and then had a diagnosis of pneumonia.  She was hospitalized for this at [facility] in January 2018.  She was on oxygen and hospitalized for 3 or 4 days and then discharged.  After that she is always had some amount of residual cough and mild shortness of breath but starting approximately 6 months ago the cough and shortness of breath was more perceptible.  However in the last 2 months a significant acceleration in the severity of cough and shortness of breath.  The cough is described as waking up at night and associated with phlegm.  She also gets short of breath walking or hurrying on level ground or walking up a slight hill.  Her primary care physician did a CT chest with contrast in February 2019.  I do not have the image with me but I reviewed the report and he reports pulmonary fibrosis not otherwise specified.  She did end up in the emergency room on October 31, 2017 and is being discharged with [facility] and redness on taper which is helping.   American College of chest physicians interstitial lung disease questionnaire  Past medical history: Positive for seizures, history of pneumonia in January 2018, acid reflux disease, dry eyes, dry mouth,  photophobia and arthralgia.  She has diagnosis of skin psoriasis.  This is not much of an issue at this point in time  Personal exposure history: She started smoking cigarettes at 84 years of age and smoked 3-4 packs a day and quit in the mid 1980s.  She reports having had use street drugs.  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  She only occasionally visits the coop.  She opens the door and looks that she can out.  Otherwise no exposure to water damage or mold a humidifier [place] or hot tub or Jacuzzi or birds or feathered pillows.  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  Then she retired and moved to [place] in [place] where she lived in normal homes for 5 years up until 8 years ago and then she and her husband moved to [place], [place] where they live in a farm.  Pulmonary toxicity history: Other than exposure to Humira and short-term prednisone no other pulmonary toxicity history including nitrofurantoin or BCG or amiodarone or chemotherapy    Walking desaturation test on 11/03/2017 185 feet x 3 laps on ROOM AIR:  did walk at slow pace and got mildly dyspneic. Did desaturate to 89%. Rest pulse ox was 98%, final  pulse ox was 89%. HR response 67/min at rest to 91/min at peak exertion. Patient Nikeshia Boschee  Did not Desaturate < 88% . Jalayne Manalo did yes  Desaturated </= 3% points. Yaritzy Buczek yes did get tachyardic    12/08/2017 Acute OV :Sinus congestion .  Patient presents for an acute office visit.  Patient complains of sinus congestion , ear fullness and popping , sinus pressure , drippy nose, watery eyes, very little cough .  Has had this for last few months , Worse for the last 2 weeks .  Seen by PCP last week started on allergy pill and nasal spray , she does not know what they were.  Says she has chronic allergy problems , worse in Spring and Fall.   She denies any discolored mucus, sinus pain, teeth pain, fever, nausea vomiting diarrhea, or chest pain.    Patient was seen for pulmonary consult March 2019 to establish for pulmonary fibrosis.  She has underlying psoriasis.  Patient was set up for a CT chest that showed fibrotic interstitial lung disease with mild honeycombing.  Autoimmune panel was essentially negative except for SS B IgG antibody.  And sed rate was mildly elevated at 34. Patient has an upcoming PFT and follow-up visit with ILD clinic in 2 weeks. She was having a severe cough last visit.  She was given a prednisone taper.  Patient says she did require one other prednisone taper but her cough is almost totally resolved.  She has a little mild cough in the morning but very mild.   OV 12/29/2017  Chief Complaint  Patient presents with  . Follow-up    Pt states she has had both good days and bad days. States she has had some problems with allergies.    Maybelle [place] presns for followup for ILD workup.it is documented below.  Pulmonary function test in terms of severity shows moderate restriction with significant reduction in diffusion capacity.  Her high-resolution CT scan of the chest is read as probable UIP but I wonder if that is mixed emphysema although she quit smoking many decades ago and indeterminate pattern for UIP.  There might be air trapping but this is not reported.  I again went over her exposures and she confirms and her husband who is here with her today that there is significant chicken coop exposure ongoing currently.  There are no new interim issues other than the fact she had a respiratory exacerbation which required prednisone which seemed to help her symptoms considerably.  Currently she is off prednisone.  Her autoimmune vasculitis and hypersensitivity pneumonitis panel was essentially negative except for trace autoimmune positivity.     Results for ANAYLA, GIANNETTI (MRN [id number]) as of 12/29/2017 13:37  Ref.  Range 12/29/2017 12:43  FVC-Pre Latest Units: L 1.49  FVC-%Pred-Pre Latest Units: % 58  FEV1-Pre Latest Units: L 1.18  FEV1-%Pred-Pre Latest Units: % 62  Pre FEV1/FVC ratio Latest Units: % 79  FEV1FVC-%Pred-Pre Latest Units: % 108  Results for LOTTA, FRANKENFIELD (MRN [id number]) as of 12/29/2017 13:37  Ref. Range 12/29/2017 12:43  DLCO unc Latest Units: ml/min/mmHg 7.82  DLCO unc % pred Latest Units: % 30   Results for ELEORA, SUTHERLAND (MRN [id number]) as of 12/29/2017 13:37  Ref. Range 11/03/2017 10:08  ASPERGILLUS FUMIGATUS Latest Ref Range: NEGATIVE  NEGATIVE  [place] Serum Latest Ref Range: NEGATIVE  NEGATIVE  Anit Nuclear Antibody(ANA) Latest Ref Range: NEGATIVE  NEGATIVE  Angiotensin-Converting Enzyme Latest Ref Range: 9 - 67 U/L 28  Cyclic Citrullin Peptide Ab Latest Units: UNITS <16  ds DNA Ab Latest Units: IU/mL <1  ENA RNP Ab Latest Ref Range: 0.0 - 0.9 AI <0.2  Myeloperoxidase Abs Latest Units: AI <1.0  Serine Protease 3 Latest Units: AI <1.0  RA Latex Turbid. Latest Ref Range: <14 IU/mL <14  SSA (Ro) (ENA) Antibody, IgG Latest Ref Range: <1.0 NEG AI <1.0 NEG  SSB (La) (ENA) Antibody, IgG Latest Ref Range: <1.0 NEG AI 1.8 POS (A)  Scleroderma (Scl-70) (ENA) Antibody, IgG Latest Ref Range: <1.0 NEG AI <1.0 NEG    IMPRESSION: 1. Spectrum of findings compatible with fibrotic interstitial lung disease with mild honeycombing. Despite the absence of a clear basilar gradient, these findings are considered to represent probable usual interstitial pneumonia (UIP). Fibrotic phase nonspecific interstitial pneumonia (NSIP) is on the differential. Follow-up high-resolution chest CT in 6-12 months would be useful to assess temporal pattern stability, as clinically warranted. 2. Tiny 3 mm solid left lower lobe pulmonary nodule. No follow-up needed if patient is low-risk. Non-contrast chest CT can be considered in 12 months if patient is high-risk. This recommendation follows the consensus statement:  Guidelines for Management of Incidental Pulmonary Nodules Detected on CT Images:From the Fleischner Society 2017; published online before print (10.1148/radiol.[id number]). 3. Three-vessel coronary atherosclerosis. 4. Dilated main pulmonary artery, suggesting pulmonary arterial hypertension.  Aortic Atherosclerosis (ICD10-I70.0).   Electronically Signed   By: Ilona Sorrel M.D.   On: 11/13/2017 16:46   OV 02/02/2018  Chief Complaint  Patient presents with  . Follow-up    Pt states things have been up and down since last visit and since bronch was performed. Pt still has problems with upset stomach and pt is still having problems with her breathing. Pt does cough in the morning with occ. green mucus. Denies any CP/chest tightness.    Follow-up interstitial lung disease.  This follow-up is after bronchoscopy with lavage which was done Jan 07, 2018.  This shows a slight preponderance of polymorphs and absence of lymphocytes thus suggesting this is not hypersensitivity pneumonitis but with the increase in polymorphs the diagnosis shift towards UIP.  I took a second opinion on radiology from Dr. Lorin Picket and she feels a CT scan is more consistent with UIP.  In the interim patient has no new complaints.  Walking desaturation test indicates a 7% pulse ox drop with tachycardia and shortness of breath but still does not drop below 88%.  Of note she does indicate that she has had chronic intermittent stress related nausea that has been worked up extensively but without any cause.   Results for CORIANA, ANGELLO (MRN [id number]) as of 01/07/2018 20:04  Ref. Range 01/07/2018 14:35  Color, Fluid Latest Ref Range: YELLOW  STRAW (A)  WBC, Fluid Latest Ref Range: 0 - 1,000 cu mm 73  Lymphs, Fluid Latest Units: % 6  Eos, Fluid Latest Units: % 4  Appearance, Fluid Latest Ref Range: CLEAR  CLOUDY (A)  Other Cells, Fluid Latest Units: % CORRELATE WITH CY...  Neutrophil Count, Fluid Latest Ref Range:  0 - 25 % 55 (H)  Monocyte-Macrophage-Serous Fluid Latest Ref Range: 50 - 90 % 35 (L)     OV 03/15/2018  Chief Complaint  Patient presents with  . Follow-up    Pt began taking Esbriet but had been having problems keeping food down while taking the med. Med was stopped 7/12 by MW due to the possible reaction and was told to stop med until OV with MR. Other  than the beginning problems with Esbriet, pt states she has been doing good since last visit   [place] , 85 y.o. , with dob 1930-09-12 and female ,Not Hispanic or Latino from [address] [place] [address] - presents to ILD  clinic for IPF diagnosis (he is aage > 71 (age 84), probable UIP on CT scan with honeycombing early, negative autoimmune and vasculitis serology and bronchoscopy 01/07/18 with 55% neutrophils on lavage) made 02/02/18 and esbriet decision taken 02/02/18   In terms of her IPF she is doing stable. She does not feel any worse. She gets dyspneic when walking out of her air conditioned room to the hot humid air she does not feel any worse. Walking desaturation test showed desaturation to 87% which seems a little bit worse than before but she denies that she is worse. She does not want to use portable oxygen but she is willing to get herself tested at night. There is no worsening cough or sputum production.  The main issue appears to be that on 03/05/2018 she started her first dose of Pirfenidone (Esbriet). She took one dose and felt fine and then 4 hours later immediately after lunch 12 her food and had nausea and vomiting. She does not believe that the nausea and vomiting were related to the Pirfenidone (Esbriet). She insetead thinks it is a recurrence of her chronic intermittent nausea and vomiting which is had for the last few years. We took more history on this and she tells me that she's had this for the last few years. It is stable. She says primary care physician worked up extensively. However shnever   gastroenterologist or had an endoscopy for this.   OV 04/27/2018  Chief Complaint  Patient presents with  . Follow-up    Pt had endoscopy performed 8/27. Pt states she has been having problems with pain and states she still has occ SOB but that is better and has an occ cough in the mornings. Denies any CP.     Quinne Pires Wilhelmsen , 84 y.o. , with dob [birth year] and female ,Not Hispanic or Latino from [address] [place] [address] - presents to ILD  clinic for IPF diagnosis (he is aage > 12 (age 84), probable UIP on CT scan with honeycombing early, negative autoimmune and vasculitis serology and bronchoscopy 01/07/18 with 55% neutrophils on lavage) made 02/02/18 and esbriet decision taken 02/02/18 , restart date 03/15/18   Mechele Claude A Nestor - presents for follow-up of her IPF. Currently she restarted her Pirfenidone (Esbriet) 03/15/2018 and escalated to 2 pills 3 times a day which she has held at that dose. She says that her fatigue is gone from mild pre-Pirfenidone (Esbriet) 2 severe post Pirfenidone (Esbriet). In addition she has weight loss. Her weight loss is 9 poundssince she started the Pirfenidone (Esbriet). She is also having extremely poor appetite. She did seeGI Dr. Barney Drain and had endoscopy 04/20/2018. According to the patientit sounds like she might have had gastritis but I'm not so sure. She's also had ultrasound of the liver and is awaiting the results. She tells me 4 days up with endoscopy she did have one episodes of nausea and vomiting. She feels that the severe D of the nausea and vomiting and the frequency of this is at baseline and she does not think the Pirfenidone (Esbriet) has made this worse. However she is categorical that the fatigue low appetite and weight loss are related to Pirfenidone (Esbriet).She is  using oxygen with pulmonary rehabilitation. She is more short of breath. Walking desaturation test today shows that she did desaturate much earlier than at baseline. Also  03/31/2018 she had overnight oxygen study and the pulse ox was less than 88%  For 7-1/2 hours. She is refusing to wear portable oxygen. She has agreed to wear oxygen during rehabilitation and at night    Simple office walk 185 feet x  3 laps goal with forehead probe 02/02/2018 Start esbriet 03/15/2018  04/27/2018   O2 used Room air Room air Room air  Number laps completed _0 but desat at 2nd lap  Comments about pace nrmal pacte Normal pace Slow pace  Resting Pulse Ox/HR 98% and 83/min 98% and HR 85/min 96% and HR 63/min  Final Pulse Ox/HR 91% and 98/min 87% and HR 91% at end of 3rd lao 87% and HR 79 at end of 2nd lap  Desaturated </= 88% no yes Yes -   Desaturated <= 3% points yes yes yes  Got Tachycardic >/= 90/min yes yes no  Symptoms at end of test dyspnea Mild dyspnea x  Miscellaneous comments Normal pace Normal pace Worse desats      Recent Labs  Lab 04/27/18 1506  HGB 13.6  HCT 40.2  WBC 9.0  PLT 345.0   Recent Labs  Lab 04/27/18 1506  NA 136  K 3.2*  CL 103  CO2 21  GLUCOSE 113*  BUN 9  CREATININE 1.12  CALCIUM 9.3  MG 1.6  PHOS 3.0   Recent Labs  Lab 04/27/18 1506  AST 17  ALT 11  ALKPHOS 63  BILITOT 0.6  PROT 8.1  ALBUMIN 3.6         has a past medical history of Arthritis, Depression, GERD (gastroesophageal reflux disease), Hypercholesterolemia, Hypertension, ILD (interstitial lung disease) ([place]), Psoriasis, Seizure ([place]), and Seizures ([place]).   reports that she quit smoking about 45 years ago. Her smoking use included cigarettes. She has a 60.00 pack-year smoking history. She has never used smokeless tobacco.  Past Surgical History:  Procedure Laterality Date  . BIOPSY  04/20/2018   Procedure: BIOPSY;  Surgeon: Danie Binder, MD;  [facility];  Service: Endoscopy;;  gastric  . DILATION AND CURETTAGE OF UTERUS     x3  . ESOPHAGOGASTRODUODENOSCOPY (EGD) WITH PROPOFOL N/A 04/20/2018   Procedure: ESOPHAGOGASTRODUODENOSCOPY (EGD)  WITH PROPOFOL;  Surgeon: Danie Binder, MD;  Location: [facility];  Service: Endoscopy;  Laterality: N/A;  9:15am  . SAVORY DILATION N/A 04/20/2018   Procedure: SAVORY DILATION;  Surgeon: Danie Binder, MD;  Location: [facility];  Service: Endoscopy;  Laterality: N/A;  . VIDEO BRONCHOSCOPY Bilateral 01/07/2018   Procedure: VIDEO BRONCHOSCOPY WITHOUT FLUORO;  Surgeon: Brand Males, MD;  Location: [facility];  Service: Cardiopulmonary;  Laterality: Bilateral;  . WRIST SURGERY Right    ORIF    Allergies  Allergen Reactions  . Humira [Adalimumab] Other (See Comments)    Worsened your psoriasis.    Immunization History  Administered Date(s) Administered  . Influenza, High Dose Seasonal PF 06/10/2017  . Pneumococcal Conjugate-13 12/09/2014    Family History  Problem Relation Age of Onset  . Heart disease Mother   . Heart disease Father   . Cancer Sister        GYN  . Colon cancer Neg Hx      Current Outpatient Medications:  .  atenolol (TENORMIN) 50 MG tablet, Take 25 mg by mouth 2 (  two) times daily. , Disp: , Rfl:  .  doxylamine, Sleep, (UNISOM) 25 MG tablet, Take 25 mg by mouth at bedtime. , Disp: , Rfl:  .  Ginger, Zingiber officinalis, (GINGER ROOT) 550 MG CAPS, Take 550 mg by mouth daily as needed (for nausea.)., Disp: , Rfl:  .  Histamine Dihydrochloride (AUSTRALIAN DREAM ARTHRITIS EX), Apply 1 application topically 3 (three) times daily as needed (for pain.)., Disp: , Rfl:  .  ondansetron (ZOFRAN) 4 MG tablet, TAKE 1 TABLET (4 MG TOTAL) BY MOUTH 3 (THREE) TIMES DAILY. (Patient taking differently: Take 4 mg by mouth 3 (three) times daily as needed for nausea or vomiting. ), Disp: 30 tablet, Rfl: 3 .  pantoprazole (PROTONIX) 40 MG tablet, Take 1 tablet (40 mg total) by mouth daily. 30 minutes before breakfast, Disp: 90 tablet, Rfl: 3 .  Pirfenidone (ESBRIET) 267 MG TABS, Take 2 tablets by mouth 3 (three) times daily. , Disp: , Rfl:  .  Polyethyl Glycol-Propyl  Glycol (SYSTANE OP), Place 1 drop into both eyes 3 (three) times daily as needed (dry/irritated eyes.). , Disp: , Rfl:  .  rOPINIRole (REQUIP) 2 MG tablet, Take 2 mg by mouth at bedtime. , Disp: , Rfl:  .  simvastatin (ZOCOR) 20 MG tablet, Take 20 mg by mouth at bedtime. , Disp: , Rfl:  .  VENTOLIN HFA 108 (90 Base) MCG/ACT inhaler, Inhale 1-2 puffs into the lungs every 6 (six) hours as needed (chest congestion/cough). , Disp: , Rfl: 0 .  vitamin B-12 (CYANOCOBALAMIN) 1000 MCG tablet, Take 1,000 mcg by mouth at bedtime. , Disp: , Rfl:  .  dextromethorphan-guaiFENesin (MUCINEX DM) 30-600 MG 12hr tablet, Take 1 tablet by mouth See admin instructions. Take 1 tablet daily in the morning, may repeat dose in the evening if needed for congestion., Disp: , Rfl:    Review of Systems     Objective:   Physical Exam  Vitals:   04/27/18 1454  BP: 108/64  Pulse: 63  SpO2: 96%  Weight: 148 lb 6.4 oz (67.3 kg)  Height: _0  (1.651 m)   Estimated body mass index is 24.7 kg/m as calculated from the following:   Height as of this encounter: _1  (1.651 m).   Weight as of this encounter: 148 lb 6.4 oz (67.3 kg).      Assessment:       ICD-10-CM   1. ILD (interstitial lung disease) (HCC) J84.9 CBC w/Diff    Basic Metabolic Panel (BMET)    Magnesium    Phosphorus    Hepatic function panel  2. Nocturnal hypoxemia G47.34   3. Encounter for therapeutic drug monitoring Z51.81 CBC w/Diff    Basic Metabolic Panel (BMET)    Magnesium    Phosphorus    Hepatic function panel  4. Fatigue, unspecified type R53.83   5. Drug-induced weight loss R63.4    T50.905A   6. Nausea and vomiting, intractability of vomiting not specified, unspecified vomiting type R11.2        Plan:     Fatigue, unspecified type  Drug-induced weight loss - 9# since escalating esbriet 05/16/18 - definitely -  due to esbriet - stop esbriet - add to allergy list    Nausea and vomiting, intractability of vomiting not  specified, unspecified vomiting type  - unclear if intermittent bouts of this is aggravated by esbriet - will go with your opinion this is a bseline problem - have reached out to your GI doc Dr Barney Drain  LD (interstitial lung disease) ([place])  Nocturnal hypoxemia  - I think IPF is worse and too bad you are not able to tolerate esbriet (this based on walking test) - respect decision to refuse portable o2 - start night time o2 2L NCbased on test 03/31/18 - use o2 at rehab - high dose flu shot 04/27/2018   Followup In 2-3 weeks do - Pre-bd spiro and dlco only. No lung volume or bd response. No post-bd spiro Return in 2-3 weeks to ILD clinic   - at follolwup assess recovery with fatigue, weight loss, nausea and ILD progression  - based on above discuss Ofev (will carry GI side effect risk) v FibroGen Phase 3 IV trial (probably a good care option) that does not carry GI side effects, cloests to approval and 1/3rd chance of placebo   > 50% of this > 40 min visit spent in face to face counseling or/and coordination of care - by this undersigned MD - Dr Brand Males. This includes one or more of the following documented above: discussion of test results, diagnostic or treatment recommendations, prognosis, risks and benefits of management options, instructions, education, compliance or risk-factor reduction     SIGNATURE    Dr. Brand Males, M.D., F.C.C.P,  Pulmonary and Critical Care Medicine Staff Physician, [place] Director - Interstitial Lung Disease  Program  Pulmonary [place] at [place], [place], [address]  Pager: [phone], If no answer or between  15:00h - 7:00h: call [phone] Telephone: [phone]  3:49 PM 04/27/2018

## 2018-04-27 NOTE — Telephone Encounter (Signed)
Discontinued Esbriet from pt's med list and placed it on pt's list of allergies due to fatigue and weight loss.  [place] and spoke with Sharyn Lull stating to her that we needed to cancel pt's Esbriet Rx.  Per Sharyn Lull, she was going to transfer me to a pharmacist to take care of this request.  Was transferred to Judson Roch, pharmacist stating this to her. Sarah expressed understanding. Nothing further needed.

## 2018-04-27 NOTE — Telephone Encounter (Signed)
  Recent Labs  Lab 04/27/18 1506  NA 136  K 3.2*  CL 103  CO2 21  GLUCOSE 113*  BUN 9  CREATININE 1.12  CALCIUM 9.3  MG 1.6  PHOS 3.0    K low and mag low  Plan Kcl 19mq daily Mag oxide 4023mper day     SIGNATURE    Dr. MuBrand MalesM.D., F.C.C.P,  Pulmonary and Critical Care Medicine Staff Physician, [place] - Interstitial Lung Disease  Program  Pulmonary [id number]: [phone] no answer or between  15:00h - 7:00h: call [phone] Telephone: [phone]  3:55 PM 04/27/2018

## 2018-04-27 NOTE — Telephone Encounter (Signed)
Received a call from Dr. Golden Pop nurse. Pt is being seen by Dr. Chase Caller and wants a call from Dr. Oneida Alar in reference to pts procedure on 04/20/18. Nurse is aware that Dr. Oneida Alar isn't in the office today. Dr. Chong Sicilian cell [phone]

## 2018-04-27 NOTE — Telephone Encounter (Signed)
ATC pt x3 and received busy dial.  Will call back

## 2018-04-27 NOTE — Addendum Note (Signed)
Addended by: Lorretta Harp on: 04/27/2018 04:13 PM   Modules accepted: Orders

## 2018-04-27 NOTE — Patient Instructions (Addendum)
Fatigue, unspecified type  Drug-induced weight loss - 9# since escalating esbriet 05/16/18 - definitely -  due to esbriet - stop esbriet - add to allergy list    Nausea and vomiting, intractability of vomiting not specified, unspecified vomiting type  - unclear if intermittent bouts of this is aggravated by esbriet - will go with your opinion this is a bseline problem - have reached out to your GI doc Dr Barney Drain  LD (interstitial lung disease) ([place])  Nocturnal hypoxemia  - I think IPF is worse and too bad you are not able to tolerate esbriet (this based on walking test) - respect decision to refuse portable o2 - start night time o2 2L NCbased on test 03/31/18 - use o2 at rehab - high dose flu shot 04/27/2018   Followup In 2-3 weeks do - Pre-bd spiro and dlco only. No lung volume or bd response. No post-bd spiro Return in 2-3 weeks to ILD clinic   - at follolwup assess recovery with fatigue, weight loss, nausea and ILD progression  - based on above discuss Ofev (will carry GI side effect risk) v FibroGen Phase 3 IV trial (probably a good care option)

## 2018-04-27 NOTE — Telephone Encounter (Signed)
Called [facility] at [phone] in regards to mutual pt we have with them. Stated to Ashlee Martin stating to her MR would like to have Dr. Barney Drain call him on his cell at [phone] to discuss recent endoscopy pt had performed on 8/27.  I gave Ashlee Martin the cell number to her. Per Ashlee Martin, she stated Dr. Oneida Alar was not at the office today but she would try to reach out to her in regards to this information.    Routing to MR.

## 2018-04-28 NOTE — Progress Notes (Signed)
Fatty liver noted. We need to repeat ultrasound in 3 months due to area of likely fatty sparing but want to ensure not other pathology. Please nic ultrasound for 3 months.

## 2018-04-28 NOTE — Telephone Encounter (Signed)
Called DR. RAMASWAMY. EXPLAINED THOUGHT NAUSEA/VOMITING DUE TO GERD. PT WILL CAL IN ONE MONTH WITH AN UPDATE AND IF NOT IMPROVED WILL OBTAIN GES. MEDS FOR ILD STOPPED BECAUSE IT CAN ALSO CAUSE N/V. WILL TRY ALTERNATIVE THAT MAY CAUSE DIARRHEA. DISCUSS BENEFITS OF MED PROBABLY OUTWEIGH RISK OF DIARRHEA.

## 2018-04-29 ENCOUNTER — Encounter (HOSPITAL_COMMUNITY)
Admission: RE | Admit: 2018-04-29 | Discharge: 2018-04-29 | Disposition: A | Discharge status: Home / Self Care | Payer: Medicare HMO | Source: Ambulatory Visit | Attending: Internal Medicine | Admitting: Internal Medicine

## 2018-04-29 DIAGNOSIS — Z79899 Other long term (current) drug therapy: Secondary | ICD-10-CM | POA: Insufficient documentation

## 2018-04-29 DIAGNOSIS — Z87891 Personal history of nicotine dependence: Secondary | ICD-10-CM | POA: Insufficient documentation

## 2018-04-29 DIAGNOSIS — J849 Interstitial pulmonary disease, unspecified: Secondary | ICD-10-CM | POA: Diagnosis not present

## 2018-04-29 DIAGNOSIS — Z7982 Long term (current) use of aspirin: Secondary | ICD-10-CM | POA: Diagnosis not present

## 2018-04-29 NOTE — Progress Notes (Signed)
Daily Session Note  Patient Details  Name: Ashlee Martin MRN: [id number] Date of Birth: [birth year] Referring Provider:     PULMONARY REHAB OTHER RESP ORIENTATION from 04/08/2018 in [place]  Referring Provider  Ramaswamy      Encounter Date: 04/29/2018  Check In: Session Check In - 04/29/18 1330      Check-In   Supervising physician immediately available to respond to emergencies  See telemetry face sheet for immediately available MD    Location  [facility]    Staff Present  Aundra Dubin, RN, BSN;Other    Medication changes reported      Yes    Comments  Patient was taken off of esbriet.    Fall or balance concerns reported     No    Tobacco Cessation  No Change    Warm-up and Cool-down  Performed as group-led instruction    Resistance Training Performed  Yes    VAD Patient?  No    PAD/SET Patient?  No      Pain Assessment   Currently in Pain?  No/denies    Pain Score  0-No pain    Multiple Pain Sites  No       Capillary Blood Glucose: No results found for this or any previous visit (from the past 24 hour(s)).    Social History   Tobacco Use  Smoking Status Former Smoker  . Packs/day: 3.00  . Years: 20.00  . Pack years: 60.00  . Types: Cigarettes  . Last attempt to quit: 11/03/1972  . Years since quitting: 45.5  Smokeless Tobacco Never Used    Goals Met:  Proper associated with RPD/PD & O2 Sat Independence with exercise equipment Using PLB without cueing & demonstrates good technique Exercise tolerated well No report of cardiac concerns or symptoms Strength training completed today  Goals Unmet:  Not Applicable  Comments: Pt able to follow exercise prescription today without complaint.  Will continue to monitor for progression. Checked out at 2:30p.    Dr. Sinda Du is Medical Director for [facility].

## 2018-04-29 NOTE — Progress Notes (Signed)
LMOM to call.

## 2018-04-30 NOTE — Progress Notes (Signed)
LMOM to call.

## 2018-05-03 DIAGNOSIS — Z299 Encounter for prophylactic measures, unspecified: Secondary | ICD-10-CM | POA: Diagnosis not present

## 2018-05-03 DIAGNOSIS — J8410 Pulmonary fibrosis, unspecified: Secondary | ICD-10-CM | POA: Diagnosis not present

## 2018-05-03 DIAGNOSIS — M542 Cervicalgia: Secondary | ICD-10-CM | POA: Diagnosis not present

## 2018-05-03 DIAGNOSIS — Z6824 Body mass index (BMI) 24.0-24.9, adult: Secondary | ICD-10-CM | POA: Diagnosis not present

## 2018-05-03 DIAGNOSIS — I10 Essential (primary) hypertension: Secondary | ICD-10-CM | POA: Diagnosis not present

## 2018-05-04 ENCOUNTER — Encounter (HOSPITAL_COMMUNITY)
Admission: RE | Admit: 2018-05-04 | Discharge: 2018-05-04 | Disposition: A | Discharge status: Home / Self Care | Payer: Medicare HMO | Source: Ambulatory Visit | Attending: Internal Medicine | Admitting: Internal Medicine

## 2018-05-04 DIAGNOSIS — E860 Dehydration: Secondary | ICD-10-CM | POA: Diagnosis not present

## 2018-05-04 DIAGNOSIS — R7881 Bacteremia: Secondary | ICD-10-CM | POA: Diagnosis not present

## 2018-05-04 DIAGNOSIS — J8410 Pulmonary fibrosis, unspecified: Secondary | ICD-10-CM | POA: Diagnosis not present

## 2018-05-04 DIAGNOSIS — G9340 Encephalopathy, unspecified: Secondary | ICD-10-CM | POA: Diagnosis not present

## 2018-05-04 DIAGNOSIS — G9341 Metabolic encephalopathy: Secondary | ICD-10-CM | POA: Diagnosis not present

## 2018-05-04 DIAGNOSIS — R0689 Other abnormalities of breathing: Secondary | ICD-10-CM | POA: Diagnosis not present

## 2018-05-04 DIAGNOSIS — I272 Pulmonary hypertension, unspecified: Secondary | ICD-10-CM | POA: Diagnosis not present

## 2018-05-04 DIAGNOSIS — R531 Weakness: Secondary | ICD-10-CM | POA: Diagnosis not present

## 2018-05-04 DIAGNOSIS — J849 Interstitial pulmonary disease, unspecified: Secondary | ICD-10-CM

## 2018-05-04 DIAGNOSIS — R112 Nausea with vomiting, unspecified: Secondary | ICD-10-CM | POA: Diagnosis not present

## 2018-05-04 DIAGNOSIS — Z87891 Personal history of nicotine dependence: Secondary | ICD-10-CM | POA: Diagnosis not present

## 2018-05-04 DIAGNOSIS — I10 Essential (primary) hypertension: Secondary | ICD-10-CM | POA: Diagnosis not present

## 2018-05-04 DIAGNOSIS — E785 Hyperlipidemia, unspecified: Secondary | ICD-10-CM | POA: Diagnosis not present

## 2018-05-04 DIAGNOSIS — R0902 Hypoxemia: Secondary | ICD-10-CM | POA: Diagnosis not present

## 2018-05-04 DIAGNOSIS — Z79899 Other long term (current) drug therapy: Secondary | ICD-10-CM | POA: Diagnosis not present

## 2018-05-04 DIAGNOSIS — R4182 Altered mental status, unspecified: Secondary | ICD-10-CM | POA: Diagnosis not present

## 2018-05-04 DIAGNOSIS — Z7982 Long term (current) use of aspirin: Secondary | ICD-10-CM | POA: Diagnosis not present

## 2018-05-04 DIAGNOSIS — Z96649 Presence of unspecified artificial hip joint: Secondary | ICD-10-CM | POA: Diagnosis not present

## 2018-05-04 DIAGNOSIS — B9620 Unspecified Escherichia coli [E. coli] as the cause of diseases classified elsewhere: Secondary | ICD-10-CM | POA: Diagnosis not present

## 2018-05-04 DIAGNOSIS — J984 Other disorders of lung: Secondary | ICD-10-CM | POA: Diagnosis not present

## 2018-05-04 DIAGNOSIS — G2581 Restless legs syndrome: Secondary | ICD-10-CM | POA: Diagnosis not present

## 2018-05-04 DIAGNOSIS — N390 Urinary tract infection, site not specified: Secondary | ICD-10-CM | POA: Diagnosis not present

## 2018-05-04 DIAGNOSIS — M1611 Unilateral primary osteoarthritis, right hip: Secondary | ICD-10-CM | POA: Diagnosis not present

## 2018-05-04 NOTE — Progress Notes (Signed)
To [place].

## 2018-05-04 NOTE — Progress Notes (Signed)
ON RECALL  °

## 2018-05-04 NOTE — Progress Notes (Signed)
Pt is aware. Forwarding to [place] to nic the repeat US. Pt said when it is scheduled to make sure that it is not on a Tues or a Thurs.

## 2018-05-04 NOTE — Progress Notes (Signed)
Daily Session Note  Patient Details  Name: Ashlee Martin MRN: [id number] Date of Birth: Dec 20, 1930 Referring Provider:     PULMONARY REHAB OTHER RESP ORIENTATION from 04/08/2018 in [place]  Referring Provider  Ramaswamy      Encounter Date: 05/04/2018  Check In: Session Check In - 05/04/18 1330      Check-In   Supervising physician immediately available to respond to emergencies  See telemetry face sheet for immediately available MD    Location  [facility]    Staff Present  Russella Dar, MS, EP, [facility], Exercise Physiologist;Other    Medication changes reported      No    Fall or balance concerns reported     No    Tobacco Cessation  No Change    Warm-up and Cool-down  Performed as group-led instruction    Resistance Training Performed  Yes    VAD Patient?  No    PAD/SET Patient?  No      Pain Assessment   Currently in Pain?  No/denies    Pain Score  0-No pain    Multiple Pain Sites  No       Capillary Blood Glucose: No results found for this or any previous visit (from the past 24 hour(s)).    Social History   Tobacco Use  Smoking Status Former Smoker  . Packs/day: 3.00  . Years: 20.00  . Pack years: 60.00  . Types: Cigarettes  . Last attempt to quit: 11/03/1972  . Years since quitting: 45.5  Smokeless Tobacco Never Used    Goals Met:  Proper associated with RPD/PD & O2 Sat Exercise tolerated well Queuing for purse lip breathing No report of cardiac concerns or symptoms Strength training completed today  Goals Unmet:  Not Applicable  Comments: Pt became very short of breath while on the treadmill. We took her off and took her vitals, which were all normal. Pt presented to be intoxicated but said it was because of the medication she is on.   Pt was moved to the NuStep. Will continue to monitor for progression.    Dr. Sinda Du is Medical Director for [facility] Pulmonary Re[facility].

## 2018-05-06 ENCOUNTER — Encounter (HOSPITAL_COMMUNITY): Payer: Medicare HMO

## 2018-05-10 DIAGNOSIS — I10 Essential (primary) hypertension: Secondary | ICD-10-CM | POA: Diagnosis not present

## 2018-05-10 DIAGNOSIS — Z299 Encounter for prophylactic measures, unspecified: Secondary | ICD-10-CM | POA: Diagnosis not present

## 2018-05-10 DIAGNOSIS — Z09 Encounter for follow-up examination after completed treatment for conditions other than malignant neoplasm: Secondary | ICD-10-CM | POA: Diagnosis not present

## 2018-05-10 DIAGNOSIS — N390 Urinary tract infection, site not specified: Secondary | ICD-10-CM | POA: Diagnosis not present

## 2018-05-10 DIAGNOSIS — B9629 Other Escherichia coli [E. coli] as the cause of diseases classified elsewhere: Secondary | ICD-10-CM | POA: Diagnosis not present

## 2018-05-10 DIAGNOSIS — Z6834 Body mass index (BMI) 34.0-34.9, adult: Secondary | ICD-10-CM | POA: Diagnosis not present

## 2018-05-10 DIAGNOSIS — Z1612 Extended spectrum beta lactamase (ESBL) resistance: Secondary | ICD-10-CM | POA: Diagnosis not present

## 2018-05-11 ENCOUNTER — Encounter (HOSPITAL_COMMUNITY)
Admission: RE | Admit: 2018-05-11 | Discharge: 2018-05-11 | Disposition: A | Discharge status: Home / Self Care | Payer: Medicare HMO | Source: Ambulatory Visit | Attending: Internal Medicine | Admitting: Internal Medicine

## 2018-05-11 DIAGNOSIS — Z87891 Personal history of nicotine dependence: Secondary | ICD-10-CM | POA: Diagnosis not present

## 2018-05-11 DIAGNOSIS — Z79899 Other long term (current) drug therapy: Secondary | ICD-10-CM | POA: Diagnosis not present

## 2018-05-11 DIAGNOSIS — J849 Interstitial pulmonary disease, unspecified: Secondary | ICD-10-CM | POA: Diagnosis not present

## 2018-05-11 DIAGNOSIS — Z7982 Long term (current) use of aspirin: Secondary | ICD-10-CM | POA: Diagnosis not present

## 2018-05-11 NOTE — Progress Notes (Signed)
Daily Session Note  Patient Details  Name: RHANDI DESPAIN MRN: [id number] Date of Birth: [birth year] Referring Provider:     PULMONARY REHAB OTHER RESP ORIENTATION from 04/08/2018 in [place]  Referring Provider  Ramaswamy      Encounter Date: 05/11/2018  Check In: Session Check In - 05/11/18 1330      Check-In   Supervising physician immediately available to respond to emergencies  See telemetry face sheet for immediately available MD    Location  [facility]    Staff Present  Russella Dar, MS, EP, [facility], Exercise Physiologist;Ella Golomb Zachery Conch, Exercise Physiologist;Other    Medication changes reported      No    Fall or balance concerns reported     No    Tobacco Cessation  No Change    Warm-up and Cool-down  Performed as group-led instruction    Resistance Training Performed  Yes    VAD Patient?  No    PAD/SET Patient?  No      Pain Assessment   Currently in Pain?  No/denies    Pain Score  0-No pain    Multiple Pain Sites  No       Capillary Blood Glucose: No results found for this or any previous visit (from the past 24 hour(s)).    Social History   Tobacco Use  Smoking Status Former Smoker  . Packs/day: 3.00  . Years: 20.00  . Pack years: 60.00  . Types: Cigarettes  . Last attempt to quit: 11/03/1972  . Years since quitting: 45.5  Smokeless Tobacco Never Used    Goals Met:  Independence with exercise equipment Using PLB without cueing & demonstrates good technique Exercise tolerated well No report of cardiac concerns or symptoms Strength training completed today  Goals Unmet:  Not Applicable  Comments: Pt able to follow exercise prescription today without complaint.  Will continue to monitor for progression. Check out 2:30.   Dr. Sinda Du is Medical Director for [facility] Pulmonary Re[facility].

## 2018-05-13 ENCOUNTER — Encounter (HOSPITAL_COMMUNITY): Payer: Medicare HMO

## 2018-05-14 NOTE — Progress Notes (Signed)
Pulmonary Individual Treatment Plan  Patient Details  Name: Ashlee Martin MRN: [id number] Date of Birth: [birth year] Referring Provider:     PULMONARY REHAB OTHER RESP ORIENTATION from 04/08/2018 in [place]  Referring Provider  [place]      Initial Encounter Date:    PULMONARY REHAB OTHER RESP ORIENTATION from 04/08/2018 in [facility]  Date  04/08/18      Visit Diagnosis: ILD (interstitial lung disease) ([place])  Patient's Home Medications on Admission:   Current Outpatient Medications:  .  atenolol (TENORMIN) 50 MG tablet, Take 25 mg by mouth 2 (two) times daily. , Disp: , Rfl:  .  dextromethorphan-guaiFENesin (MUCINEX DM) 30-600 MG 12hr tablet, Take 1 tablet by mouth See admin instructions. Take 1 tablet daily in the morning, may repeat dose in the evening if needed for congestion., Disp: , Rfl:  .  doxylamine, Sleep, (UNISOM) 25 MG tablet, Take 25 mg by mouth at bedtime. , Disp: , Rfl:  .  Ginger, Zingiber officinalis, (GINGER ROOT) 550 MG CAPS, Take 550 mg by mouth daily as needed (for nausea.)., Disp: , Rfl:  .  Histamine Dihydrochloride (AUSTRALIAN DREAM ARTHRITIS EX), Apply 1 application topically 3 (three) times daily as needed (for pain.)., Disp: , Rfl:  .  ondansetron (ZOFRAN) 4 MG tablet, TAKE 1 TABLET (4 MG TOTAL) BY MOUTH 3 (THREE) TIMES DAILY. (Patient taking differently: Take 4 mg by mouth 3 (three) times daily as needed for nausea or vomiting. ), Disp: 30 tablet, Rfl: 3 .  pantoprazole (PROTONIX) 40 MG tablet, Take 1 tablet (40 mg total) by mouth daily. 30 minutes before breakfast, Disp: 90 tablet, Rfl: 3 .  Polyethyl Glycol-Propyl Glycol (SYSTANE OP), Place 1 drop into both eyes 3 (three) times daily as needed (dry/irritated eyes.). , Disp: , Rfl:  .  rOPINIRole (REQUIP) 2 MG tablet, Take 2 mg by mouth at bedtime. , Disp: , Rfl:  .  simvastatin (ZOCOR) 20 MG tablet, Take 20 mg by mouth at bedtime. , Disp: , Rfl:  .   VENTOLIN HFA 108 (90 Base) MCG/ACT inhaler, Inhale 1-2 puffs into the lungs every 6 (six) hours as needed (chest congestion/cough). , Disp: , Rfl: 0 .  vitamin B-12 (CYANOCOBALAMIN) 1000 MCG tablet, Take 1,000 mcg by mouth at bedtime. , Disp: , Rfl:   Past Medical History: Past Medical History:  Diagnosis Date  . Arthritis   . Depression   . GERD (gastroesophageal reflux disease)   . Hypercholesterolemia   . Hypertension   . ILD (interstitial lung disease) ([place])   . Psoriasis    had been on Humira but continued to have UTIs, sinus infections, etc. Taken off  . Seizure ([place])    had 3-4 seizures, 8 years ago, placed on medication and has had no seizures since.  . Seizures ([place])     Tobacco Use: Social History   Tobacco Use  Smoking Status Former Smoker  . Packs/day: 3.00  . Years: 20.00  . Pack years: 60.00  . Types: Cigarettes  . Last attempt to quit: 11/03/1972  . Years since quitting: 45.5  Smokeless Tobacco Never Used    Labs: Recent Review Flowsheet Data    There is no flowsheet data to display.      Capillary Blood Glucose: No results found for: GLUCAP   Pulmonary Assessment Scores: Pulmonary Assessment Scores    Row Name 04/08/18 1617         ADL UCSD   ADL Phase  Entry     SOB Score total  41     Rest  0     Walk  8     Stairs  1     Bath  1     Dress  1     Shop  3       CAT Score   CAT Score  16       mMRC Score   mMRC Score  3        Pulmonary Function Assessment: Pulmonary Function Assessment - 04/08/18 1616      Pulmonary Function Tests   FVC%  58 %    FEV1%  62 %    FEV1/FVC Ratio  108    RV%  35 %    DLCO%  30 %      Initial Spirometry Results   FVC%  58 %    FEV1%  62 %    FEV1/FVC Ratio  108      Breath   Bilateral Breath Sounds  Clear    Shortness of Breath  Yes   Mostly during walk test.       Exercise Target Goals: Exercise Program Goal: Individual exercise prescription set using results from initial 6 min  walk test and THRR while considering  patient's activity barriers and safety.   Exercise Prescription Goal: Initial exercise prescription builds to 30-45 minutes a day of aerobic activity, 2-3 days per week.  Home exercise guidelines will be given to patient during program as part of exercise prescription that the participant will acknowledge.  Activity Barriers & Risk Stratification:   6 Minute Walk: 6 Minute Walk    Row Name 04/08/18 1537         6 Minute Walk   Phase  Initial     Distance  800 feet     Walk Time  6 minutes     # of Rest Breaks  1     MPH  1.51     METS  2.16     RPE  13     Perceived Dyspnea   13     Resting HR  68 bpm     Resting BP  110/62     Resting Oxygen Saturation   91 %     Exercise Oxygen Saturation  during 6 min walk  88 %     Max Ex. HR  93 bpm     Max Ex. BP  130/64     2 Minute Post BP  124/62        Oxygen Initial Assessment: Oxygen Initial Assessment - 04/08/18 1615      Home Oxygen   Home Oxygen Device  None    Sleep Oxygen Prescription  None    Home Exercise Oxygen Prescription  None    Home at Rest Exercise Oxygen Prescription  None    Compliance with Home Oxygen Use  --   N/A     Initial 6 min Walk   Oxygen Used  None      Program Oxygen Prescription   Program Oxygen Prescription  None       Oxygen Re-Evaluation: Oxygen Re-Evaluation    Row Name 05/13/18 1549             Program Oxygen Prescription   Program Oxygen Prescription  Continuous       Liters per minute  4       FiO2%  92  Home Oxygen   Home Oxygen Device  Portable Concentrator       Sleep Oxygen Prescription  Continuous       Liters per minute  2       Home Exercise Oxygen Prescription  Continuous       Liters per minute  4       Home at Rest Exercise Oxygen Prescription  Continuous       Liters per minute  2       Compliance with Home Oxygen Use  Yes         Goals/Expected Outcomes   Short Term Goals  To learn and understand  importance of monitoring SPO2 with pulse oximeter and demonstrate accurate use of the pulse oximeter.;To learn and exhibit compliance with exercise, home and travel O2 prescription;To learn and understand importance of maintaining oxygen saturations>88%;To learn and demonstrate proper pursed lip breathing techniques or other breathing techniques.       Long  Term Goals  Exhibits compliance with exercise, home and travel O2 prescription;Verbalizes importance of monitoring SPO2 with pulse oximeter and return demonstration;Maintenance of O2 saturations>88%;Exhibits proper breathing techniques, such as pursed lip breathing or other method taught during program session       Comments  Patient was recently prescribed O2 at home. Not sure if she is supposed to use continuously or as needed. Will clarify this at her next visit. Patient demonstrates proper pursed lip breathing technique and proper usage of Pulse Oximetry and verbalizes understanding of maintaining her O2 Sat >88%. Will conitnue to monitor.        Goals/Expected Outcomes  Patient will continue to meet her short and long term goals.           Oxygen Discharge (Final Oxygen Re-Evaluation): Oxygen Re-Evaluation - 05/13/18 1549      Program Oxygen Prescription   Program Oxygen Prescription  Continuous    Liters per minute  4    FiO2%  92      Home Oxygen   Home Oxygen Device  Portable Concentrator    Sleep Oxygen Prescription  Continuous    Liters per minute  2    Home Exercise Oxygen Prescription  Continuous    Liters per minute  4    Home at Rest Exercise Oxygen Prescription  Continuous    Liters per minute  2    Compliance with Home Oxygen Use  Yes      Goals/Expected Outcomes   Short Term Goals  To learn and understand importance of monitoring SPO2 with pulse oximeter and demonstrate accurate use of the pulse oximeter.;To learn and exhibit compliance with exercise, home and travel O2 prescription;To learn and understand importance  of maintaining oxygen saturations>88%;To learn and demonstrate proper pursed lip breathing techniques or other breathing techniques.    Long  Term Goals  Exhibits compliance with exercise, home and travel O2 prescription;Verbalizes importance of monitoring SPO2 with pulse oximeter and return demonstration;Maintenance of O2 saturations>88%;Exhibits proper breathing techniques, such as pursed lip breathing or other method taught during program session    Comments  Patient was recently prescribed O2 at home. Not sure if she is supposed to use continuously or as needed. Will clarify this at her next visit. Patient demonstrates proper pursed lip breathing technique and proper usage of Pulse Oximetry and verbalizes understanding of maintaining her O2 Sat >88%. Will conitnue to monitor.     Goals/Expected Outcomes  Patient will continue to meet her short and long term goals.  Initial Exercise Prescription: Initial Exercise Prescription - 04/08/18 1500      Date of Initial Exercise RX and Referring Provider   Date  04/08/18    Referring Provider  Ramaswamy    Expected Discharge Date  07/24/18      Treadmill   MPH  0.8    Grade  0    Minutes  17    METs  1.6      NuStep   Level  1    SPM  42    Minutes  22    METs  1.7      Prescription Details   Frequency (times per week)  2    Duration  Progress to 30 minutes of continuous aerobic without signs/symptoms of physical distress      Intensity   THRR 40-80% of Max Heartrate  102-113-125    Ratings of Perceived Exertion  11-13    Perceived Dyspnea  0-4      Progression   Progression  Continue progressive overload as per policy without signs/symptoms or physical distress.      Resistance Training   Training Prescription  Yes    Weight  1    Reps  10-15       Perform Capillary Blood Glucose checks as needed.  Exercise Prescription Changes:  Exercise Prescription Changes    Row Name 04/22/18 0700 04/30/18 1400            Response to Exercise   Blood Pressure (Admit)  132/60  102/60      Blood Pressure (Exercise)  152/68  132/68      Blood Pressure (Exit)  136/60  110/66      Heart Rate (Admit)  68 bpm  77 bpm      Heart Rate (Exercise)  96 bpm  79 bpm      Heart Rate (Exit)  76 bpm  79 bpm      Oxygen Saturation (Admit)  90 %  93 %      Oxygen Saturation (Exercise)  97 %  90 %      Oxygen Saturation (Exit)  93 %  93 %      Rating of Perceived Exertion (Exercise)  11  11      Perceived Dyspnea (Exercise)  11  11      Symptoms  - SOB  -      Duration  Progress to 30 minutes of  aerobic without signs/symptoms of physical distress  Progress to 30 minutes of  aerobic without signs/symptoms of physical distress      Intensity  THRR New [phone]  THRR unchanged        Progression   Progression  Continue to progress workloads to maintain intensity without signs/symptoms of physical distress.  Continue to progress workloads to maintain intensity without signs/symptoms of physical distress.      Average METs  1.98  1.5        Resistance Training   Training Prescription  No  Yes      Weight  1  1      Reps  10-15  10-15      Time  5 Minutes  5 Minutes        Treadmill   MPH  0.8  0.8      Grade  0  0      Minutes  17  17      METs  1.6  1.61  NuStep   Level  1  1      SPM  62  84      Minutes  22  22      METs  1.6  1.5         Exercise Comments:  Exercise Comments    Row Name 04/22/18 0802 04/30/18 1454 05/14/18 0749       Exercise Comments  Patient is not feeling her best due to her stomach issues. She wants to come and wants to get stronger.   Patient still experiences weakness due to stomach pain but has continued to attend rehab to reach her goals.   Patient is doing better. She needs to be more consistent.         Exercise Goals and Review:  Exercise Goals    Row Name 04/08/18 1553             Exercise Goals   Increase Physical Activity  Yes       Intervention   Provide advice, education, support and counseling about physical activity/exercise needs.;Develop an individualized exercise prescription for aerobic and resistive training based on initial evaluation findings, risk stratification, comorbidities and participant's personal goals.       Expected Outcomes  Short Term: Attend rehab on a regular basis to increase amount of physical activity.;Long Term: Add in home exercise to make exercise part of routine and to increase amount of physical activity.       Increase Strength and Stamina  Yes       Intervention  Provide advice, education, support and counseling about physical activity/exercise needs.;Develop an individualized exercise prescription for aerobic and resistive training based on initial evaluation findings, risk stratification, comorbidities and participant's personal goals.       Expected Outcomes  Short Term: Increase workloads from initial exercise prescription for resistance, speed, and METs.;Long Term: Improve cardiorespiratory fitness, muscular endurance and strength as measured by increased METs and functional capacity (6MWT)       Able to understand and use rate of perceived exertion (RPE) scale  Yes       Intervention  Provide education and explanation on how to use RPE scale       Expected Outcomes  Short Term: Able to use RPE daily in rehab to express subjective intensity level;Long Term:  Able to use RPE to guide intensity level when exercising independently       Able to understand and use Dyspnea scale  Yes       Intervention  Provide education and explanation on how to use Dyspnea scale       Expected Outcomes  Short Term: Able to use Dyspnea scale daily in rehab to express subjective sense of shortness of breath during exertion;Long Term: Able to use Dyspnea scale to guide intensity level when exercising independently       Knowledge and understanding of Target Heart Rate Range (THRR)  Yes       Intervention  Provide education and  explanation of THRR including how the numbers were predicted and where they are located for reference       Expected Outcomes  Short Term: Able to use daily as guideline for intensity in rehab;Long Term: Able to use THRR to govern intensity when exercising independently       Able to check pulse independently  Yes       Intervention  Provide education and demonstration on how to check pulse in carotid and radial arteries.;Review the importance of  being able to check your own pulse for safety during independent exercise       Expected Outcomes  Short Term: Able to explain why pulse checking is important during independent exercise;Long Term: Able to check pulse independently and accurately       Understanding of Exercise Prescription  Yes       Intervention  Provide education, explanation, and written materials on patient's individual exercise prescription       Expected Outcomes  Short Term: Able to explain program exercise prescription;Long Term: Able to explain home exercise prescription to exercise independently          Exercise Goals Re-Evaluation : Exercise Goals Re-Evaluation    Row Name 04/22/18 0752 04/30/18 1452 05/14/18 0745         Exercise Goal Re-Evaluation   Exercise Goals Review  Increase Physical Activity;Increase Strength and Stamina;Able to understand and use Dyspnea scale;Understanding of Exercise Prescription;Knowledge and understanding of Target Heart Rate Range (THRR)  Increase Physical Activity;Increase Strength and Stamina;Able to understand and use Dyspnea scale;Understanding of Exercise Prescription;Knowledge and understanding of Target Heart Rate Range (THRR)  Increase Physical Activity;Increase Strength and Stamina;Able to understand and use Dyspnea scale;Understanding of Exercise Prescription;Knowledge and understanding of Target Heart Rate Range (THRR)     Comments  Patient is still new to the program. She is having some stomach issues that makes it difficult for her  to eat. When she comes she is sometimes weak because of not eating. She is seeing her doctor to determine what the problem is. She has not been progressed yet due to having doctors appointments about her stomach. She does like coming to the program and hopes it will help her reach her goals.   Patient has had 4 visits so far. She is still weak due to her stomach pains but will hopefully have those issues resolved soon.   Patient has had 6 visits so far. She is still weak due to her last episode of having urinary track infection. She is on an antibiotics and they have added supplemental  oxygen. Will clarify the liter flow on her next visit.      Expected Outcomes  Get energy back, iIncrease appetite and continue to get stronger.   Get energy back, iIncrease appetite and continue to get stronger.   Get energy back, iIncrease appetite and continue to get stronger.         Discharge Exercise Prescription (Final Exercise Prescription Changes): Exercise Prescription Changes - 04/30/18 1400      Response to Exercise   Blood Pressure (Admit)  102/60    Blood Pressure (Exercise)  132/68    Blood Pressure (Exit)  110/66    Heart Rate (Admit)  77 bpm    Heart Rate (Exercise)  79 bpm    Heart Rate (Exit)  79 bpm    Oxygen Saturation (Admit)  93 %    Oxygen Saturation (Exercise)  90 %    Oxygen Saturation (Exit)  93 %    Rating of Perceived Exertion (Exercise)  11    Perceived Dyspnea (Exercise)  11    Duration  Progress to 30 minutes of  aerobic without signs/symptoms of physical distress    Intensity  THRR unchanged      Progression   Progression  Continue to progress workloads to maintain intensity without signs/symptoms of physical distress.    Average METs  1.5      Resistance Training   Training Prescription  Yes  Weight  1    Reps  10-15    Time  5 Minutes      Treadmill   MPH  0.8    Grade  0    Minutes  17    METs  1.61      NuStep   Level  1    SPM  84    Minutes  22     METs  1.5       Nutrition:  Target Goals: Understanding of nutrition guidelines, daily intake of sodium <1578m, cholesterol <2042m calories 30% from fat and 7% or less from saturated fats, daily to have 5 or more servings of fruits and vegetables.  Biometrics: Pre Biometrics - 04/08/18 1610      Pre Biometrics   Height  _0  (1.651 m)    Weight  152 lb 11.2 oz (69.3 kg)    Waist Circumference  26 inches    Hip Circumference  29 inches    Waist to Hip Ratio  0.9 %    BMI (Calculated)  25.41    Triceps Skinfold  6 mm    % Body Fat  15 %    Grip Strength  32 kg    Flexibility  0 in    Single Leg Stand  10 seconds        Nutrition Therapy Plan and Nutrition Goals: Nutrition Therapy & Goals - 05/13/18 1553      Nutrition Therapy   RD appointment deferred  Yes      Personal Nutrition Goals   Personal Goal #2  Patient says she is trying to eat a heart healthy diet.     Additional Goals?  No       Nutrition Assessments: Nutrition Assessments - 04/08/18 1620      MEDFICTS Scores   Pre Score  27       Nutrition Goals Re-Evaluation:   Nutrition Goals Discharge (Final Nutrition Goals Re-Evaluation):   Psychosocial: Target Goals: Acknowledge presence or absence of significant depression and/or stress, maximize coping skills, provide positive support system. Participant is able to verbalize types and ability to use techniques and skills needed for reducing stress and depression.  Initial Review & Psychosocial Screening: Initial Psych Review & Screening - 04/08/18 1612      Initial Review   Current issues with  None Identified      Family Dynamics   Good Support System?  Yes      Barriers   Psychosocial barriers to participate in program  There are no identifiable barriers or psychosocial needs.      Screening Interventions   Interventions  Encouraged to exercise    Expected Outcomes  Short Term goal: Identification and review with participant of any Quality  of Life or Depression concerns found by scoring the questionnaire.;Long Term goal: The participant improves quality of Life and PHQ9 Scores as seen by post scores and/or verbalization of changes       Quality of Life Scores: Quality of Life - 04/08/18 1613      Quality of Life   Select  Quality of Life      Quality of Life Scores   Health/Function Pre  20.38 %    Socioeconomic Pre  19.71 %    Psych/Spiritual Pre  20.14 %    Family Pre  20.6 %    GLOBAL Pre  20.23 %      Scores of 19 and below usually indicate a poorer quality of life  in these areas.  A difference of  2-3 points is a clinically meaningful difference.  A difference of 2-3 points in the total score of the Quality of Life Index has been associated with significant improvement in overall quality of life, self-image, physical symptoms, and general health in studies assessing change in quality of life.   PHQ-9: Recent Review Flowsheet Data    Depression screen [facility] 2/9 04/08/2018   Decreased Interest 2   Down, Depressed, Hopeless 1   PHQ - 2 Score 3   Altered sleeping 0   Tired, decreased energy 1   Change in appetite 1   Feeling bad or failure about yourself  1   Trouble concentrating 1   Moving slowly or fidgety/restless 0   Suicidal thoughts 0   PHQ-9 Score 7   Difficult doing work/chores Somewhat difficult     Interpretation of Total Score  Total Score Depression Severity:  1-4 = Minimal depression, 5-9 = Mild depression, 10-14 = Moderate depression, 15-19 = Moderately severe depression, 20-27 = Severe depression   Psychosocial Evaluation and Intervention: Psychosocial Evaluation - 04/08/18 1613      Psychosocial Evaluation & Interventions   Interventions  Encouraged to exercise with the program and follow exercise prescription    Continue Psychosocial Services   No Follow up required       Psychosocial Re-Evaluation: Psychosocial Re-Evaluation    [place] Name 05/13/18 1557             Psychosocial  Re-Evaluation   Current issues with  Current Depression       Comments  Patient's initial QOL score was 20.33 and her PHQ-9 score was 7. She says she is depressed but refuses any treatment. She feels she is able to manage it on her own.        Expected Outcomes  Patient's QOL and PHQ-9 score will improve and she will verbalize improvement in her depression at discharge.        Interventions  Stress management education;Encouraged to attend Pulmonary Rehabilitation for the exercise;Relaxation education       Continue Psychosocial Services   Follow up required by staff          Psychosocial Discharge (Final Psychosocial Re-Evaluation): Psychosocial Re-Evaluation - 05/13/18 1557      Psychosocial Re-Evaluation   Current issues with  Current Depression    Comments  Patient's initial QOL score was 20.33 and her PHQ-9 score was 7. She says she is depressed but refuses any treatment. She feels she is able to manage it on her own.     Expected Outcomes  Patient's QOL and PHQ-9 score will improve and she will verbalize improvement in her depression at discharge.     Interventions  Stress management education;Encouraged to attend Pulmonary Rehabilitation for the exercise;Relaxation education    Continue Psychosocial Services   Follow up required by staff        Education: Education Goals: Education classes will be provided on a weekly basis, covering required topics. Participant will state understanding/return demonstration of topics presented.  Learning Barriers/Preferences: Learning Barriers/Preferences - 04/08/18 1520      Learning Barriers/Preferences   Learning Barriers  None    Learning Preferences  Skilled Demonstration;Group Instruction;Verbal Instruction       Education Topics: How Lungs Work and Diseases: - Discuss the anatomy of the lungs and diseases that can affect the lungs, such as COPD.   Exercise: -Discuss the importance of exercise, FITT principles of exercise, normal  and  abnormal responses to exercise, and how to exercise safely.   Environmental Irritants: -Discuss types of environmental irritants and how to limit exposure to environmental irritants.   Meds/Inhalers and oxygen: - Discuss respiratory medications, definition of an inhaler and oxygen, and the proper way to use an inhaler and oxygen.   PULMONARY REHAB OTHER RESPIRATORY from 04/29/2018 in [place]  Date  04/15/18  Educator  D. Wynetta Emery      Energy Saving Techniques: - Discuss methods to conserve energy and decrease shortness of breath when performing activities of daily living.    Bronchial Hygiene / Breathing Techniques: - Discuss breathing mechanics, pursed-lip breathing technique,  proper posture, effective ways to clear airways, and other functional breathing techniques   PULMONARY REHAB OTHER RESPIRATORY from 04/29/2018 in [place]  Date  04/29/18  Educator  Etheleen Mayhew  Instruction Review Code  2- Demonstrated Understanding      Cleaning Equipment: - Provides group verbal and written instruction about the health risks of elevated stress, cause of high stress, and healthy ways to reduce stress.   Nutrition I: Fats: - Discuss the types of cholesterol, what cholesterol does to the body, and how cholesterol levels can be controlled.   Nutrition II: Labels: -Discuss the different components of food labels and how to read food labels.   Respiratory Infections: - Discuss the signs and symptoms of respiratory infections, ways to prevent respiratory infections, and the importance of seeking medical treatment when having a respiratory infection.   Stress I: Signs and Symptoms: - Discuss the causes of stress, how stress may lead to anxiety and depression, and ways to limit stress.   Stress II: Relaxation: -Discuss relaxation techniques to limit stress.   Oxygen for Home/Travel: - Discuss how to prepare for travel when on  oxygen and proper ways to transport and store oxygen to ensure safety.   Knowledge Questionnaire Score: Knowledge Questionnaire Score - 04/08/18 1621      Knowledge Questionnaire Score   Pre Score  12/18       Core Components/Risk Factors/Patient Goals at Admission: Personal Goals and Risk Factors at Admission - 04/08/18 1621      Core Components/Risk Factors/Patient Goals on Admission    Weight Management  Weight Maintenance    Personal Goal Other  Yes    Personal Goal  Gain energy, increase appetite, continue to get stronger through PR program    Intervention  Attend class 2 x week, supplement with 3 x week exercise at home.     Expected Outcomes  Reach personal goals.        Core Components/Risk Factors/Patient Goals Review:  Goals and Risk Factor Review    Row Name 05/13/18 1553             Core Components/Risk Factors/Patient Goals Review   Personal Goals Review  Improve shortness of breath with ADL's;Other Get energy back; increase appetite.        Review  Patient has completed 6 sessions losing 4 lbs since her initial visit. Her attendance has been inconsistent due to illness, MD appointments and procedures. She recently has a UTI and was prescribed oxygen therapy. Her progress in the program has been impeded by her inconsistent attendance. Will continue to monitor for progress.        Expected Outcomes  Patient will continue to attend sessions and complete the program and meet her personal goals.           Core Components/Risk Factors/Patient  Goals at Discharge (Final Review):  Goals and Risk Factor Review - 05/13/18 1553      Core Components/Risk Factors/Patient Goals Review   Personal Goals Review  Improve shortness of breath with ADL's;Other   Get energy back; increase appetite.    Review  Patient has completed 6 sessions losing 4 lbs since her initial visit. Her attendance has been inconsistent due to illness, MD appointments and procedures. She recently has a  UTI and was prescribed oxygen therapy. Her progress in the program has been impeded by her inconsistent attendance. Will continue to monitor for progress.     Expected Outcomes  Patient will continue to attend sessions and complete the program and meet her personal goals.        ITP Comments: ITP Comments    Row Name 04/22/18 1555           ITP Comments  Patient is new to the program. She has completed 3 sessions. She has been out for this week due to having an endoscopy procedure done on 04/20/18 with 2 biopsy's taken and dilation of esophagus with wide spread inflammation found of her stomach. Also had liver scan with fatty liver. Will have a follow up liver scan in 3 to 6 months. Will continue to monitor for progress.           Comments: ITP REVIEW Pt is making expected progress toward pulmonary rehab goals after completing 6 sessions. Recommend continued exercise, life style modification, education, and utilization of breathing techniques to increase stamina and strength and decrease shortness of breath with exertion.

## 2018-05-18 ENCOUNTER — Encounter (HOSPITAL_COMMUNITY)
Admission: RE | Admit: 2018-05-18 | Discharge: 2018-05-18 | Disposition: A | Discharge status: Home / Self Care | Payer: Medicare HMO | Source: Ambulatory Visit | Attending: Internal Medicine | Admitting: Internal Medicine

## 2018-05-18 DIAGNOSIS — J849 Interstitial pulmonary disease, unspecified: Secondary | ICD-10-CM | POA: Diagnosis not present

## 2018-05-18 DIAGNOSIS — Z79899 Other long term (current) drug therapy: Secondary | ICD-10-CM | POA: Diagnosis not present

## 2018-05-18 DIAGNOSIS — Z87891 Personal history of nicotine dependence: Secondary | ICD-10-CM | POA: Diagnosis not present

## 2018-05-18 DIAGNOSIS — Z7982 Long term (current) use of aspirin: Secondary | ICD-10-CM | POA: Diagnosis not present

## 2018-05-18 NOTE — Progress Notes (Signed)
Daily Session Note  Patient Details  Name: Ashlee Martin MRN: [id number] Date of Birth: 1931/03/01 Referring Provider:     PULMONARY REHAB OTHER RESP ORIENTATION from 04/08/2018 in [place]  Referring Provider  Ramaswamy      Encounter Date: 05/18/2018  Check In: Session Check In - 05/18/18 1330      Check-In   Supervising physician immediately available to respond to emergencies  See telemetry face sheet for immediately available MD    Location  [facility]    Staff Present  Russella Dar, MS, EP, [facility], Exercise Physiologist;Dhanush Jokerst Zachery Conch, Exercise Physiologist;Other    Medication changes reported      No    Fall or balance concerns reported     No    Tobacco Cessation  No Change    Warm-up and Cool-down  Performed as group-led instruction    Resistance Training Performed  Yes    VAD Patient?  No    PAD/SET Patient?  No      Pain Assessment   Currently in Pain?  No/denies    Pain Score  0-No pain    Multiple Pain Sites  No       Capillary Blood Glucose: No results found for this or any previous visit (from the past 24 hour(s)).    Social History   Tobacco Use  Smoking Status Former Smoker  . Packs/day: 3.00  . Years: 20.00  . Pack years: 60.00  . Types: Cigarettes  . Last attempt to quit: 11/03/1972  . Years since quitting: 45.5  Smokeless Tobacco Never Used    Goals Met:  Proper associated with RPD/PD & O2 Sat Independence with exercise equipment Using PLB without cueing & demonstrates good technique Exercise tolerated well No report of cardiac concerns or symptoms Strength training completed today  Goals Unmet:  Not Applicable  Comments: Pt able to follow exercise prescription today without complaint.  Will continue to monitor for progression. Check out 2:30.   Dr. Sinda Du is Medical Director for [facility].

## 2018-05-20 ENCOUNTER — Encounter (HOSPITAL_COMMUNITY)
Admission: RE | Admit: 2018-05-20 | Discharge: 2018-05-20 | Disposition: A | Discharge status: Home / Self Care | Payer: Medicare HMO | Source: Ambulatory Visit | Attending: Internal Medicine | Admitting: Internal Medicine

## 2018-05-20 DIAGNOSIS — Z87891 Personal history of nicotine dependence: Secondary | ICD-10-CM | POA: Diagnosis not present

## 2018-05-20 DIAGNOSIS — Z7982 Long term (current) use of aspirin: Secondary | ICD-10-CM | POA: Diagnosis not present

## 2018-05-20 DIAGNOSIS — Z79899 Other long term (current) drug therapy: Secondary | ICD-10-CM | POA: Diagnosis not present

## 2018-05-20 DIAGNOSIS — J849 Interstitial pulmonary disease, unspecified: Secondary | ICD-10-CM

## 2018-05-20 NOTE — Progress Notes (Signed)
Daily Session Note  Patient Details  Name: Ashlee Martin MRN: [id number] Date of Birth: 16-Nov-1930 Referring Provider:     PULMONARY REHAB OTHER RESP ORIENTATION from 04/08/2018 in [place]  Referring Provider  Ramaswamy      Encounter Date: 05/20/2018  Check In: Session Check In - 05/20/18 1330      Check-In   Supervising physician immediately available to respond to emergencies  See telemetry face sheet for immediately available MD    Location  [facility]    Staff Present  Russella Dar, MS, EP, [facility], Exercise Physiologist;Amanda Ballard, Exercise Physiologist;Zada Haser Wynetta Emery, RN, BSN    Medication changes reported      No    Fall or balance concerns reported     No    Warm-up and Cool-down  Performed as group-led instruction    Resistance Training Performed  Yes    VAD Patient?  No    PAD/SET Patient?  No      Pain Assessment   Currently in Pain?  No/denies    Pain Score  0-No pain    Multiple Pain Sites  No       Capillary Blood Glucose: No results found for this or any previous visit (from the past 24 hour(s)).    Social History   Tobacco Use  Smoking Status Former Smoker  . Packs/day: 3.00  . Years: 20.00  . Pack years: 60.00  . Types: Cigarettes  . Last attempt to quit: 11/03/1972  . Years since quitting: 45.5  Smokeless Tobacco Never Used    Goals Met:  Proper associated with RPD/PD & O2 Sat Independence with exercise equipment Improved SOB with ADL's Using PLB without cueing & demonstrates good technique Exercise tolerated well No report of cardiac concerns or symptoms Strength training completed today  Goals Unmet:  Not Applicable  Comments: Pt able to follow exercise prescription today without complaint.  Will continue to monitor for progression. Check out 1430.   Dr. Sinda Du is Medical Director for [facility].

## 2018-05-25 ENCOUNTER — Encounter (HOSPITAL_COMMUNITY)
Admission: RE | Admit: 2018-05-25 | Discharge: 2018-05-25 | Disposition: A | Discharge status: Home / Self Care | Payer: Medicare HMO | Source: Ambulatory Visit | Attending: Internal Medicine | Admitting: Internal Medicine

## 2018-05-25 DIAGNOSIS — J849 Interstitial pulmonary disease, unspecified: Secondary | ICD-10-CM | POA: Diagnosis not present

## 2018-05-25 DIAGNOSIS — Z79899 Other long term (current) drug therapy: Secondary | ICD-10-CM | POA: Insufficient documentation

## 2018-05-25 DIAGNOSIS — Z87891 Personal history of nicotine dependence: Secondary | ICD-10-CM | POA: Diagnosis not present

## 2018-05-25 DIAGNOSIS — Z7982 Long term (current) use of aspirin: Secondary | ICD-10-CM | POA: Diagnosis not present

## 2018-05-25 NOTE — Progress Notes (Signed)
Daily Session Note  Patient Details  Name: Ashlee Martin MRN: [id number] Date of Birth: [birth year] Referring Provider:     PULMONARY REHAB OTHER RESP ORIENTATION from 04/08/2018 in [place]  Referring Provider  Ramaswamy      Encounter Date: 05/25/2018  Check In: Session Check In - 05/25/18 1330      Check-In   Supervising physician immediately available to respond to emergencies  See telemetry face sheet for immediately available MD    Location  [facility]    Staff Present  Russella Dar, MS, EP, [facility], Exercise Physiologist;Aniketh Huberty Zachery Conch, Exercise Physiologist    Medication changes reported      No    Fall or balance concerns reported     No    Tobacco Cessation  No Change    Warm-up and Cool-down  Performed as group-led instruction    Resistance Training Performed  Yes    VAD Patient?  No    PAD/SET Patient?  No      Pain Assessment   Currently in Pain?  No/denies    Pain Score  0-No pain    Multiple Pain Sites  No       Capillary Blood Glucose: No results found for this or any previous visit (from the past 24 hour(s)).    Social History   Tobacco Use  Smoking Status Former Smoker  . Packs/day: 3.00  . Years: 20.00  . Pack years: 60.00  . Types: Cigarettes  . Last attempt to quit: 11/03/1972  . Years since quitting: 45.5  Smokeless Tobacco Never Used    Goals Met:  Proper associated with RPD/PD & O2 Sat Independence with exercise equipment Using PLB without cueing & demonstrates good technique Exercise tolerated well No report of cardiac concerns or symptoms Strength training completed today  Goals Unmet:  Not Applicable  Comments: Pt able to follow exercise prescription today without complaint.  Will continue to monitor for progression. Check out 2:30.   Dr. Sinda Du is Medical Director for [facility].

## 2018-05-26 ENCOUNTER — Telehealth: Payer: Self-pay | Admitting: Internal Medicine

## 2018-05-26 ENCOUNTER — Ambulatory Visit (INDEPENDENT_AMBULATORY_CARE_PROVIDER_SITE_OTHER): Payer: Medicare HMO | Admitting: Internal Medicine

## 2018-05-26 ENCOUNTER — Ambulatory Visit: Payer: Medicare HMO | Admitting: Internal Medicine

## 2018-05-26 ENCOUNTER — Encounter: Payer: Self-pay | Admitting: Internal Medicine

## 2018-05-26 VITALS — BP 104/62 | HR 81 | Ht 65.0 in | Wt 144.0 lb

## 2018-05-26 DIAGNOSIS — R634 Abnormal weight loss: Secondary | ICD-10-CM | POA: Diagnosis not present

## 2018-05-26 DIAGNOSIS — J849 Interstitial pulmonary disease, unspecified: Secondary | ICD-10-CM

## 2018-05-26 DIAGNOSIS — R112 Nausea with vomiting, unspecified: Secondary | ICD-10-CM

## 2018-05-26 DIAGNOSIS — Z5181 Encounter for therapeutic drug level monitoring: Secondary | ICD-10-CM

## 2018-05-26 DIAGNOSIS — T50905A Adverse effect of unspecified drugs, medicaments and biological substances, initial encounter: Principal | ICD-10-CM

## 2018-05-26 DIAGNOSIS — J84112 Idiopathic pulmonary fibrosis: Secondary | ICD-10-CM | POA: Diagnosis not present

## 2018-05-26 NOTE — Progress Notes (Signed)
Patient was not feeling well breathing wise, and had runny nose. Pt attempted 5 pre spiro attempts and was not able to completed not a one. Pt asked to stop the PFT today.

## 2018-05-26 NOTE — Telephone Encounter (Signed)
Pt is having PFT done at [facility] before next OV with MR so pt only needs to have 58mn OV with MR scheduled due to this.  Attempted to call pt to see if I could get her scheduled for a follow up appt with MR in [facility] clinic but unable to reach pt. Left a detailed message for pt stating that [facility] appt with MR is just for the OV to go over results of PFT and HRCT.  Thursday, October 31 in the AM is an ILD clinic day so pt could be scheduled for a 362m appt on that day with MR.

## 2018-05-26 NOTE — Telephone Encounter (Signed)
Pt wanting morning appointointment.Ashlee Martin

## 2018-05-26 NOTE — Progress Notes (Signed)
PCP Glenda Chroman, MD  HPI  IOV 11/03/2017  Chief Complaint  Patient presents with  . Consult    Self referral. Pt states she was diagnosed with pulmonary fibrosis x4 weeks ago. States she has c/o cough with green mucus, mild SOB. Denies any CP.    84 year old female self-referred for evaluation of pulmonary fibrosis.  She is fairly good historian.  She tells me that she has diagnosis of psoriasis of the skin in 2016 and was under the care of Dr. Channing Mutters in [place].  She was on Humira for 11 months taking monthly injections but her rash got bad in November 2017 and then she was treated with prednisone.  She went on vacation to [place] and then returned and then had a diagnosis of pneumonia.  She was hospitalized for this at [facility] in January 2018.  She was on oxygen and hospitalized for 3 or 4 days and then discharged.  After that she is always had some amount of residual cough and mild shortness of breath but starting approximately 6 months ago the cough and shortness of breath was more perceptible.  However in the last 2 months a significant acceleration in the severity of cough and shortness of breath.  The cough is described as waking up at night and associated with phlegm.  She also gets short of breath walking or hurrying on level ground or walking up a slight hill.  Her primary care physician did a CT chest with contrast in February 2019.  I do not have the image with me but I reviewed the report and he reports pulmonary fibrosis not otherwise specified.  She did end up in the emergency room on October 31, 2017 and is being discharged with [facility] and redness on taper which is helping.   American College of chest physicians interstitial lung disease questionnaire  Past medical history: Positive for seizures, history of pneumonia in January 2018, acid reflux disease, dry eyes, dry mouth, photophobia and arthralgia.  She has diagnosis of skin psoriasis.  This is not much of an issue at  this point in time  Personal exposure history: She started smoking cigarettes at 84 years of age and smoked 3-4 packs a day and quit in the mid 1980s.  She reports having had use street drugs.  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  She only occasionally visits the coop.  She opens the door and looks that she can out.  Otherwise no exposure to water damage or mold a humidifier [place] or hot tub or Jacuzzi or birds or feathered pillows.  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  Then she retired and moved to [place] in [place] where she lived in normal homes for 5 years up until 8 years ago and then she and her husband moved to [place], [place] where they live in a farm.  Pulmonary toxicity history: Other than exposure to Humira and short-term prednisone no other pulmonary toxicity history including nitrofurantoin or BCG or amiodarone or chemotherapy    Walking desaturation test on 11/03/2017 185 feet x 3 laps on ROOM AIR:  did walk at slow pace and got mildly dyspneic. Did desaturate to 89%. Rest pulse ox was 98%, final pulse ox was 89%. HR response 67/min at rest to 91/min at peak exertion. Patient Clarie Greis  Did not Desaturate <  88% . Abygayle Sitar did yes  Desaturated </= 3% points. Tajae Farinas yes did get tachyardic    12/08/2017 Acute OV :Sinus congestion .  Patient presents for an acute office visit.  Patient complains of sinus congestion , ear fullness and popping , sinus pressure , drippy nose, watery eyes, very little cough .  Has had this for last few months , Worse for the last 2 weeks .  Seen by PCP last week started on allergy pill and nasal spray , she does not know what they were.  Says she has chronic allergy problems , worse in Spring and Fall.  She denies any discolored mucus, sinus pain, teeth pain, fever, nausea vomiting diarrhea, or chest  pain.    Patient was seen for pulmonary consult March 2019 to establish for pulmonary fibrosis.  She has underlying psoriasis.  Patient was set up for a CT chest that showed fibrotic interstitial lung disease with mild honeycombing.  Autoimmune panel was essentially negative except for SS B IgG antibody.  And sed rate was mildly elevated at 34. Patient has an upcoming PFT and follow-up visit with ILD clinic in 2 weeks. She was having a severe cough last visit.  She was given a prednisone taper.  Patient says she did require one other prednisone taper but her cough is almost totally resolved.  She has a little mild cough in the morning but very mild.   OV 12/29/2017  Chief Complaint  Patient presents with  . Follow-up    Pt states she has had both good days and bad days. States she has had some problems with allergies.    Khloie Therriault presns for followup for ILD workup.it is documented below.  Pulmonary function test in terms of severity shows moderate restriction with significant reduction in diffusion capacity.  Her high-resolution CT scan of the chest is read as probable UIP but I wonder if that is mixed emphysema although she quit smoking many decades ago and indeterminate pattern for UIP.  There might be air trapping but this is not reported.  I again went over her exposures and she confirms and her husband who is here with her today that there is significant chicken coop exposure ongoing currently.  There are no new interim issues other than the fact she had a respiratory exacerbation which required prednisone which seemed to help her symptoms considerably.  Currently she is off prednisone.  Her autoimmune vasculitis and hypersensitivity pneumonitis panel was essentially negative except for trace autoimmune positivity.     Results for DARLEEN, MOFFITT (MRN [id number]) as of 12/29/2017 13:37  Ref. Range 12/29/2017 12:43  FVC-Pre Latest Units: L 1.49  FVC-%Pred-Pre Latest Units: % 58  FEV1-Pre  Latest Units: L 1.18  FEV1-%Pred-Pre Latest Units: % 62  Pre FEV1/FVC ratio Latest Units: % 79  FEV1FVC-%Pred-Pre Latest Units: % 108  Results for SHELITHA, MAGLEY (MRN [id number]) as of 12/29/2017 13:37  Ref. Range 12/29/2017 12:43  DLCO unc Latest Units: ml/min/mmHg 7.82  DLCO unc % pred Latest Units: % 30   Results for MILES, BORKOWSKI (MRN [id number]) as of 12/29/2017 13:37  Ref. Range 11/03/2017 10:08  ASPERGILLUS FUMIGATUS Latest Ref Range: NEGATIVE  NEGATIVE  Pigeon Serum Latest Ref Range: NEGATIVE  NEGATIVE  Anit Nuclear Antibody(ANA) Latest Ref Range: NEGATIVE  NEGATIVE  Angiotensin-Converting Enzyme Latest Ref Range: 9 - 67 U/L 28  Cyclic Citrullin Peptide Ab Latest Units: UNITS <16  ds DNA Ab Latest Units: IU/mL <1  ENA RNP Ab Latest Ref  Range: 0.0 - 0.9 AI <0.2  Myeloperoxidase Abs Latest Units: AI <1.0  Serine Protease 3 Latest Units: AI <1.0  RA Latex Turbid. Latest Ref Range: <14 IU/mL <14  SSA (Ro) (ENA) Antibody, IgG Latest Ref Range: <1.0 NEG AI <1.0 NEG  SSB (La) (ENA) Antibody, IgG Latest Ref Range: <1.0 NEG AI 1.8 POS (A)  Scleroderma (Scl-70) (ENA) Antibody, IgG Latest Ref Range: <1.0 NEG AI <1.0 NEG    IMPRESSION: 1. Spectrum of findings compatible with fibrotic interstitial lung disease with mild honeycombing. Despite the absence of a clear basilar gradient, these findings are considered to represent probable usual interstitial pneumonia (UIP). Fibrotic phase nonspecific interstitial pneumonia (NSIP) is on the differential. Follow-up high-resolution chest CT in 6-12 months would be useful to assess temporal pattern stability, as clinically warranted. 2. Tiny 3 mm solid left lower lobe pulmonary nodule. No follow-up needed if patient is low-risk. Non-contrast chest CT can be considered in 12 months if patient is high-risk. This recommendation follows the consensus statement: Guidelines for Management of Incidental Pulmonary Nodules Detected on CT Images:From  the Fleischner Society 2017; published online before print (10.1148/radiol.2493241991). 3. Three-vessel coronary atherosclerosis. 4. Dilated main pulmonary artery, suggesting pulmonary arterial hypertension.  Aortic Atherosclerosis (ICD10-I70.0).   Electronically Signed   By: Ilona Sorrel M.D.   On: 11/13/2017 16:46   OV 02/02/2018  Chief Complaint  Patient presents with  . Follow-up    Pt states things have been up and down since last visit and since bronch was performed. Pt still has problems with upset stomach and pt is still having problems with her breathing. Pt does cough in the morning with occ. green mucus. Denies any CP/chest tightness.    Follow-up interstitial lung disease.  This follow-up is after bronchoscopy with lavage which was done Jan 07, 2018.  This shows a slight preponderance of polymorphs and absence of lymphocytes thus suggesting this is not hypersensitivity pneumonitis but with the increase in polymorphs the diagnosis shift towards UIP.  I took a second opinion on radiology from Dr. Lorin Picket and she feels a CT scan is more consistent with UIP.  In the interim patient has no new complaints.  Walking desaturation test indicates a 7% pulse ox drop with tachycardia and shortness of breath but still does not drop below 88%.  Of note she does indicate that she has had chronic intermittent stress related nausea that has been worked up extensively but without any cause.   Results for MALIYAH, WILLETS (MRN [id number]) as of 01/07/2018 20:04  Ref. Range 01/07/2018 14:35  Color, Fluid Latest Ref Range: YELLOW  STRAW (A)  WBC, Fluid Latest Ref Range: 0 - 1,000 cu mm 73  Lymphs, Fluid Latest Units: % 6  Eos, Fluid Latest Units: % 4  Appearance, Fluid Latest Ref Range: CLEAR  CLOUDY (A)  Other Cells, Fluid Latest Units: % CORRELATE WITH CY...  Neutrophil Count, Fluid Latest Ref Range: 0 - 25 % 55 (H)  Monocyte-Macrophage-Serous Fluid Latest Ref Range: 50 - 90 % 35 (L)      OV 03/15/2018  Chief Complaint  Patient presents with  . Follow-up    Pt began taking Esbriet but had been having problems keeping food down while taking the med. Med was stopped 7/12 by MW due to the possible reaction and was told to stop med until OV with MR. Other than the beginning problems with Esbriet, pt states she has been doing good since last visit   [place] ,  84 y.o. , with dob 02/22/1931 and female ,Not Hispanic or Latino from [address] [place] [address] - presents to ILD  clinic for IPF diagnosis (he is aage > 89 (age 48), probable UIP on CT scan with honeycombing early, negative autoimmune and vasculitis serology and bronchoscopy 01/07/18 with 55% neutrophils on lavage) made 02/02/18 and esbriet decision taken 02/02/18   In terms of her IPF she is doing stable. She does not feel any worse. She gets dyspneic when walking out of her air conditioned room to the hot humid air she does not feel any worse. Walking desaturation test showed desaturation to 87% which seems a little bit worse than before but she denies that she is worse. She does not want to use portable oxygen but she is willing to get herself tested at night. There is no worsening cough or sputum production.  The main issue appears to be that on 03/05/2018 she started her first dose of Pirfenidone (Esbriet). She took one dose and felt fine and then 4 hours later immediately after lunch 12 her food and had nausea and vomiting. She does not believe that the nausea and vomiting were related to the Pirfenidone (Esbriet). She insetead thinks it is a recurrence of her chronic intermittent nausea and vomiting which is had for the last few years. We took more history on this and she tells me that she's had this for the last few years. It is stable. She says primary care physician worked up extensively. However shnever  gastroenterologist or had an endoscopy for this.   OV 04/27/2018  Chief Complaint  Patient  presents with  . Follow-up    Pt had endoscopy performed 8/27. Pt states she has been having problems with pain and states she still has occ SOB but that is better and has an occ cough in the mornings. Denies any CP.     Mikaela Hilgeman Torbeck , 84 y.o. , with dob August 24, 1931 and female ,Not Hispanic or Latino from [address] [place] [address] - presents to ILD  clinic for IPF diagnosis (he is aage > 13 (age 89), probable UIP on CT scan with honeycombing early, negative autoimmune and vasculitis serology and bronchoscopy 01/07/18 with 55% neutrophils on lavage) made 02/02/18 and esbriet decision taken 02/02/18 , restart date 03/15/18   Mechele Claude A Hassinger - presents for follow-up of her IPF. Currently she restarted her Pirfenidone (Esbriet) 03/15/2018 and escalated to 2 pills 3 times a day which she has held at that dose. She says that her fatigue is gone from mild pre-Pirfenidone (Esbriet) 2 severe post Pirfenidone (Esbriet). In addition she has weight loss. Her weight loss is 9 poundssince she started the Pirfenidone (Esbriet). She is also having extremely poor appetite. She did seeGI Dr. Barney Drain and had endoscopy 04/20/2018. According to the patientit sounds like she might have had gastritis but I'm not so sure. She's also had ultrasound of the liver and is awaiting the results. She tells me 4 days up with endoscopy she did have one episodes of nausea and vomiting. She feels that the severe D of the nausea and vomiting and the frequency of this is at baseline and she does not think the Pirfenidone (Esbriet) has made this worse. However she is categorical that the fatigue low appetite and weight loss are related to Pirfenidone (Esbriet).She is using oxygen with pulmonary rehabilitation. She is more short of breath. Walking desaturation test today shows that she did desaturate much earlier  than at baseline. Also 03/31/2018 she had overnight oxygen study and the pulse ox was less than 88%  For 7-1/2 hours. She  is refusing to wear portable oxygen. She has agreed to wear oxygen during rehabilitation and at night  OV 05/26/2018  Subjective:  Patient ID: Cheryll Cockayne, female , DOB: [birth year] , age 13 y.o. , MRN: [id number] , ADDRESS: [place] [id number]   05/26/2018 -   Chief Complaint  Patient presents with  . Follow-up    PFT attempted but pt was unable to complete it. Pt has had worsening  and was admitted to the hospital 9/11-9/13 due to a UTI and O2 sats dropping to 70%. Pt states she does have a mild cough in the mornings. Denies any complaints of CP. Pt is now having to wear O2 due to her breathing worsening and is on 3L with exertion and 2L at rest.     HPI Ariday A Mcisaac 84 y.o. -presents IPF-year follow-up.  Last seen April 27, 2018.  Just before that end of August 2019 she underwent endoscopy that showed acid reflux and peptic stricture.  According to the patient she was stretched.  She has stopped as.  After the visit April 27, 2018.  Her vomiting episodes have improved although not completely resolved..  It is unclear if this is because of Pirfenidone (Esbriet) being stopped or because of the endoscopy.  In any event May 05, 2018 she was admitted at [facility].  She brought the outside records with me.  It appears that she had E. coli UTI.  In addition her BNP was high at 1600.  She had a CT scan of the chest that I was able to visualized.  There is evidence of worsening of ILD but this was in the form of a high BNP.  At this point in time she is better but somewhat fatigued.  She has been discharged with continuous oxygen although when we turned her oxygen off which was set at 3 L and turn it off for 20 minutes she had normal pulse ox at rest and desaturated at the first left.  So it appears that her hypoxemia is improved compared to admission but it is still worse compared to a visit 1 month ago.      Simple office walk 185 feet x  3 laps goal with  forehead probe 02/02/2018 Start esbriet 03/15/2018 contnue esbriet 04/27/2018 Stop esbriet 05/26/2018   O2 used Room air Room air Room air  room air  Number laps completed _0 but desat at 2nd lap  desaturated at first lap  Comments about pace nrmal pacte Normal pace Slow pace  slow pace  Resting Pulse Ox/HR 98% and 83/min 98% and HR 85/min 96% and HR 63/min  98% and 88/min  Final Pulse Ox/HR 91% and 98/min 87% and HR 91% at end of 3rd lao 87% and HR 79 at end of 2nd lap  86% and heart rate of 95 at first lab  Desaturated </= 88% no yes Yes -  yes  Desaturated <= 3% points yes yes yes  yes  Got Tachycardic >/= 90/min yes yes no  yes  Symptoms at end of test dyspnea Mild dyspnea x  dyspnea  Miscellaneous comments Normal pace Normal pace Worse desats  worsening desaturation.  Corrected with 3 laps of walking 2 L      ROS - per HPI     has a past medical history  of Arthritis, Depression, GERD (gastroesophageal reflux disease), Hypercholesterolemia, Hypertension, ILD (interstitial lung disease) ([place]), Psoriasis, Seizure ([place]), and Seizures ([place]).   reports that she quit smoking about 45 years ago. Her smoking use included cigarettes. She has a 60.00 pack-year smoking history. She has never used smokeless tobacco.  Past Surgical History:  Procedure Laterality Date  . BIOPSY  04/20/2018   Procedure: BIOPSY;  Surgeon: Danie Binder, MD;  [facility];  Service: Endoscopy;;  gastric  . DILATION AND CURETTAGE OF UTERUS     x3  . ESOPHAGOGASTRODUODENOSCOPY (EGD) WITH PROPOFOL N/A 04/20/2018   Procedure: ESOPHAGOGASTRODUODENOSCOPY (EGD) WITH PROPOFOL;  Surgeon: Danie Binder, MD;  Location: [facility];  Service: Endoscopy;  Laterality: N/A;  9:15am  . SAVORY DILATION N/A 04/20/2018   Procedure: SAVORY DILATION;  Surgeon: Danie Binder, MD;  Location: [facility];  Service: Endoscopy;  Laterality: N/A;  . VIDEO BRONCHOSCOPY Bilateral 01/07/2018   Procedure: VIDEO  BRONCHOSCOPY WITHOUT FLUORO;  Surgeon: Brand Males, MD;  Location: [facility];  Service: Cardiopulmonary;  Laterality: Bilateral;  . WRIST SURGERY Right    ORIF    Allergies  Allergen Reactions  . Humira [Adalimumab] Other (See Comments)    Worsened your psoriasis.  . Pirfenidone Other (See Comments)    Fatigue and weight loss    Immunization History  Administered Date(s) Administered  . Influenza, High Dose Seasonal PF 06/10/2017, 04/27/2018  . Pneumococcal Conjugate-13 12/09/2014    Family History  Problem Relation Age of Onset  . Heart disease Mother   . Heart disease Father   . Cancer Sister        GYN  . Colon cancer Neg Hx      Current Outpatient Medications:  .  atenolol (TENORMIN) 25 MG tablet, Take 25 mg by mouth 2 (two) times daily. , Disp: , Rfl:  .  dextromethorphan-guaiFENesin (MUCINEX DM) 30-600 MG 12hr tablet, Take 1 tablet by mouth See admin instructions. Take 1 tablet daily in the morning, may repeat dose in the evening if needed for congestion., Disp: , Rfl:  .  doxylamine, Sleep, (UNISOM) 25 MG tablet, Take 25 mg by mouth at bedtime. , Disp: , Rfl:  .  Histamine Dihydrochloride (AUSTRALIAN DREAM ARTHRITIS EX), Apply 1 application topically 3 (three) times daily as needed (for pain.)., Disp: , Rfl:  .  pantoprazole (PROTONIX) 40 MG tablet, Take 1 tablet (40 mg total) by mouth daily. 30 minutes before breakfast, Disp: 90 tablet, Rfl: 3 .  rOPINIRole (REQUIP) 2 MG tablet, Take 2 mg by mouth at bedtime. , Disp: , Rfl:  .  simvastatin (ZOCOR) 20 MG tablet, Take 20 mg by mouth at bedtime. , Disp: , Rfl:  .  VENTOLIN HFA 108 (90 Base) MCG/ACT inhaler, Inhale 1-2 puffs into the lungs every 6 (six) hours as needed (chest congestion/cough). , Disp: , Rfl: 0 .  vitamin B-12 (CYANOCOBALAMIN) 1000 MCG tablet, Take 1,000 mcg by mouth at bedtime. , Disp: , Rfl:  .  Polyethyl Glycol-Propyl Glycol (SYSTANE OP), Place 1 drop into both eyes 3 (three) times daily as  needed (dry/irritated eyes.). , Disp: , Rfl:       Objective:   Vitals:   05/26/18 1053  BP: 104/62  Pulse: 81  SpO2: 96%  Weight: 144 lb (65.3 kg)  Height: _0  (1.651 m)    Estimated body mass index is 23.96 kg/m as calculated from the following:   Height as of this encounter: _1  (1.651  m).   Weight as of this encounter: 144 lb (65.3 kg).   155 #  -July 2019 148# - 04/27/18  _0 @  Filed Weights   05/26/18 1053  Weight: 144 lb (65.3 kg)     Physical Exam  General Appearance:    Alert, cooperative, no distress, appears stated age -yes, Deconditioned looking - yes , OBESE  - no, Sitting on Wheelchair -  no  Head:    Normocephalic, without obvious abnormality, atraumatic  Eyes:    PERRL, conjunctiva/corneas clear,  Ears:    Normal TM's and external ear canals, both ears  Nose:   Nares normal, septum midline, mucosa normal, no drainage    or sinus tenderness. OXYGEN ON  - yes . Patient is @ 3L [place]   Throat:   Lips, mucosa, and tongue normal; teeth and gums normal. Cyanosis on lips - no  Neck:   Supple, symmetrical, trachea midline, no adenopathy;    thyroid:  no enlargement/tenderness/nodules; no carotid   bruit or JVD  Back:     Symmetric, no curvature, ROM normal, no CVA tenderness  Lungs:     Distress - no , Wheeze no, Barrell Chest - no, Purse lip breathing - no, Crackles - yes base   Chest Wall:    No tenderness or deformity.    Heart:    Regular rate and rhythm, S1 and S2 normal, no rub   or gallop, Murmur - no  Breast Exam:    NOT DONE  Abdomen:     Soft, non-tender, bowel sounds active all four quadrants,    no masses, no organomegaly. Visceral obesity - no  Genitalia:   NOT DONE  Rectal:   NOT DONE  Extremities:   Extremities - normal, Has Cane - no, Clubbing - no, Edema - no  Pulses:   2+ and symmetric all extremities  Skin:   Stigmata of Connective Tissue Disease - no  Lymph nodes:   Cervical, supraclavicular, and axillary nodes normal    Psychiatric:  Neurologic:   Pleasant - yes, Anxious - no, Flat affect - yes  CAm-ICU - neg, Alert and Oriented x 3 - yes, Moves all 4s - yes, Speech - normal, Cognition - intact           Assessment:       ICD-10-CM   1. Drug-induced weight loss R63.4    T50.905A   2. Nausea and vomiting, intractability of vomiting not specified, unspecified vomiting type R11.2   3. IPF (idiopathic pulmonary fibrosis) ([place]) J84.112        Plan:     Patient Instructions  Drug-induced weight loss - - definitely -  due to esbriet - weight stable at 148# since 04/27/18 after stopping esbriet - no anti-fibrotic for now t    Nausea and vomiting, intractability of vomiting not specified, unspecified vomiting type  -Seems this was in part because of acid reflux and peptic stricture diagnosed April 20, 2018 -I think he pirfenidone play a role in making this worse  ILD (interstitial lung disease) (HCC)/idiopathic pulmonary fibrosis   -This seems to be progression in disease based on clinical visit April 27, 2018 and also CT scan of the chest May 05, 2018 although some of the CT findings on May 05, 2018 could be related to volume overload because of high BNP blood test that day.  This is because the oxygen needs a slightly better compared to admission but still worse than last visit  -Continue to wear  oxygen 24/7   -Continue to stay off anti-fibrotic's.  Not sure nintedanib is a good choice given your GI side effects   -Do spirometry and DLCO in the next  2-3 weeks; can do it in [facility]  -Do high-resolution CT scan of the chest sometime after June 08, 2018 -can do this at [facility] but prefer you do this at [facility] because protocol is better    Followup -And third/fourth week of October 2019 but after lung function studies and CT scan of the chest -to discuss further therapeutic strategies   > 50% of this > 40 min visit spent in face to face  counseling or/and coordination of care - by this undersigned MD - Dr Brand Males. This includes one or more of the following documented above: discussion of test results, diagnostic or treatment recommendations, prognosis, risks and benefits of management options, instructions, education, compliance or risk-factor reduction   SIGNATURE    Dr. Brand Males, M.D., F.C.C.P,  Pulmonary and Critical Care Medicine Staff Physician, [place] Director - Interstitial Lung Disease  Program  Pulmonary [place] at [place], [place], [address]  Pager: [phone], If no answer or between  15:00h - 7:00h: call [phone] Telephone: [phone]  11:20 AM 05/26/2018

## 2018-05-26 NOTE — Patient Instructions (Addendum)
Drug-induced weight loss - - definitely -  due to esbriet - weight stable at 148# since 04/27/18 after stopping esbriet - no anti-fibrotic for now t    Nausea and vomiting, intractability of vomiting not specified, unspecified vomiting type  -Seems this was in part because of acid reflux and peptic stricture diagnosed April 20, 2018 -I think he pirfenidone play a role in making this worse  ILD (interstitial lung disease) (HCC)/idiopathic pulmonary fibrosis   -This seems to be progression in disease based on clinical visit April 27, 2018 and also CT scan of the chest May 05, 2018 although some of the CT findings on May 05, 2018 could be related to volume overload because of high BNP blood test that day.  This is because the oxygen needs a slightly better compared to admission but still worse than last visit  -Continue to wear oxygen 24/7   -Continue to stay off anti-fibrotic's.  Not sure nintedanib is a good choice given your GI side effects   -Do spirometry and DLCO in the next  2-3 weeks; [facility] in [facility]  -Do high-resolution CT scan of the chest sometime after June 08, 2018 -can do this at [facility] but prefer you do this at [facility] because protocol is better    Followup -And third/fourth week of October 2019 but after lung function studies and CT scan of the chest -to discuss further therapeutic strategies - return to ild clinic

## 2018-05-26 NOTE — Addendum Note (Signed)
Addended by: Lorretta Harp on: 05/26/2018 11:38 AM   Modules accepted: Orders

## 2018-05-27 ENCOUNTER — Encounter (HOSPITAL_COMMUNITY): Payer: Medicare HMO

## 2018-05-27 NOTE — Telephone Encounter (Signed)
Noted by Triage will sign off

## 2018-05-27 NOTE — Telephone Encounter (Signed)
Patient returned call.  States she is got the message about PFT at [facility].  Scheduled OV with MR for 10/31 at 9:30.  No call back is necessary.

## 2018-06-01 ENCOUNTER — Encounter (HOSPITAL_COMMUNITY)
Admission: RE | Admit: 2018-06-01 | Discharge: 2018-06-01 | Disposition: A | Discharge status: Home / Self Care | Payer: Medicare HMO | Source: Ambulatory Visit | Attending: Internal Medicine | Admitting: Internal Medicine

## 2018-06-01 DIAGNOSIS — Z79899 Other long term (current) drug therapy: Secondary | ICD-10-CM | POA: Diagnosis not present

## 2018-06-01 DIAGNOSIS — J849 Interstitial pulmonary disease, unspecified: Secondary | ICD-10-CM | POA: Diagnosis not present

## 2018-06-01 DIAGNOSIS — Z87891 Personal history of nicotine dependence: Secondary | ICD-10-CM | POA: Diagnosis not present

## 2018-06-01 DIAGNOSIS — Z7982 Long term (current) use of aspirin: Secondary | ICD-10-CM | POA: Diagnosis not present

## 2018-06-01 NOTE — Progress Notes (Signed)
Daily Session Note  Patient Details  Name: KHLOI RAWL MRN: [id number] Date of Birth: 05/07/1931 Referring Provider:     PULMONARY REHAB OTHER RESP ORIENTATION from 04/08/2018 in [place]  Referring Provider  Ramaswamy      Encounter Date: 06/01/2018  Check In: Session Check In - 06/01/18 1330      Check-In   Supervising physician immediately available to respond to emergencies  See telemetry face sheet for immediately available MD    Location  [facility]    Staff Present  Russella Dar, MS, EP, [facility], Exercise Physiologist;Fallon Haecker Zachery Conch, Exercise Physiologist    Medication changes reported      No    Fall or balance concerns reported     No    Tobacco Cessation  No Change    Warm-up and Cool-down  Performed as group-led instruction    Resistance Training Performed  Yes    VAD Patient?  No    PAD/SET Patient?  No      Pain Assessment   Currently in Pain?  No/denies    Pain Score  0-No pain    Multiple Pain Sites  No       Capillary Blood Glucose: No results found for this or any previous visit (from the past 24 hour(s)).    Social History   Tobacco Use  Smoking Status Former Smoker  . Packs/day: 3.00  . Years: 20.00  . Pack years: 60.00  . Types: Cigarettes  . Last attempt to quit: 11/03/1972  . Years since quitting: 45.6  Smokeless Tobacco Never Used    Goals Met:  Proper associated with RPD/PD & O2 Sat Independence with exercise equipment Using PLB without cueing & demonstrates good technique Exercise tolerated well No report of cardiac concerns or symptoms Strength training completed today  Goals Unmet:  Not Applicable  Comments: Pt able to follow exercise prescription today without complaint.  Will continue to monitor for progression. Check out 2:30.   Dr. Sinda Du is Medical Director for [facility].

## 2018-06-03 ENCOUNTER — Encounter (HOSPITAL_COMMUNITY)
Admission: RE | Admit: 2018-06-03 | Discharge: 2018-06-03 | Disposition: A | Discharge status: Home / Self Care | Payer: Medicare HMO | Source: Ambulatory Visit | Attending: Internal Medicine | Admitting: Internal Medicine

## 2018-06-03 DIAGNOSIS — Z87891 Personal history of nicotine dependence: Secondary | ICD-10-CM | POA: Diagnosis not present

## 2018-06-03 DIAGNOSIS — Z79899 Other long term (current) drug therapy: Secondary | ICD-10-CM | POA: Diagnosis not present

## 2018-06-03 DIAGNOSIS — Z7982 Long term (current) use of aspirin: Secondary | ICD-10-CM | POA: Diagnosis not present

## 2018-06-03 DIAGNOSIS — J849 Interstitial pulmonary disease, unspecified: Secondary | ICD-10-CM

## 2018-06-03 NOTE — Progress Notes (Signed)
Pulmonary Individual Treatment Plan  Patient Details  Name: Ashlee Martin MRN: [id number] Date of Birth: 1930-09-04 Referring Provider:     PULMONARY REHAB OTHER RESP ORIENTATION from 04/08/2018 in [facility]  Referring Provider  [place]      Initial Encounter Date:    PULMONARY REHAB OTHER RESP ORIENTATION from 04/08/2018 in [facility]  Date  04/08/18      Visit Diagnosis: ILD (interstitial lung disease) ([place])  Patient's Home Medications on Admission:   Current Outpatient Medications:  .  atenolol (TENORMIN) 25 MG tablet, Take 25 mg by mouth 2 (two) times daily. , Disp: , Rfl:  .  dextromethorphan-guaiFENesin (MUCINEX DM) 30-600 MG 12hr tablet, Take 1 tablet by mouth See admin instructions. Take 1 tablet daily in the morning, may repeat dose in the evening if needed for congestion., Disp: , Rfl:  .  doxylamine, Sleep, (UNISOM) 25 MG tablet, Take 25 mg by mouth at bedtime. , Disp: , Rfl:  .  Histamine Dihydrochloride (AUSTRALIAN DREAM ARTHRITIS EX), Apply 1 application topically 3 (three) times daily as needed (for pain.)., Disp: , Rfl:  .  pantoprazole (PROTONIX) 40 MG tablet, Take 1 tablet (40 mg total) by mouth daily. 30 minutes before breakfast, Disp: 90 tablet, Rfl: 3 .  Polyethyl Glycol-Propyl Glycol (SYSTANE OP), Place 1 drop into both eyes 3 (three) times daily as needed (dry/irritated eyes.). , Disp: , Rfl:  .  rOPINIRole (REQUIP) 2 MG tablet, Take 2 mg by mouth at bedtime. , Disp: , Rfl:  .  simvastatin (ZOCOR) 20 MG tablet, Take 20 mg by mouth at bedtime. , Disp: , Rfl:  .  VENTOLIN HFA 108 (90 Base) MCG/ACT inhaler, Inhale 1-2 puffs into the lungs every 6 (six) hours as needed (chest congestion/cough). , Disp: , Rfl: 0 .  vitamin B-12 (CYANOCOBALAMIN) 1000 MCG tablet, Take 1,000 mcg by mouth at bedtime. , Disp: , Rfl:   Past Medical History: Past Medical History:  Diagnosis Date  . Arthritis   . Depression   . GERD  (gastroesophageal reflux disease)   . Hypercholesterolemia   . Hypertension   . ILD (interstitial lung disease) ([place])   . Psoriasis    had been on Humira but continued to have UTIs, sinus infections, etc. Taken off  . Seizure ([place])    had 3-4 seizures, 8 years ago, placed on medication and has had no seizures since.  . Seizures ([place])     Tobacco Use: Social History   Tobacco Use  Smoking Status Former Smoker  . Packs/day: 3.00  . Years: 20.00  . Pack years: 60.00  . Types: Cigarettes  . Last attempt to quit: 11/03/1972  . Years since quitting: 45.6  Smokeless Tobacco Never Used    Labs: Recent Review Flowsheet Data    There is no flowsheet data to display.      Capillary Blood Glucose: No results found for: GLUCAP   Pulmonary Assessment Scores: Pulmonary Assessment Scores    Row Name 04/08/18 1617         ADL UCSD   ADL Phase  Entry     SOB Score total  41     Rest  0     Walk  8     Stairs  1     Bath  1     Dress  1     Shop  3       CAT Score   CAT Score  16  mMRC Score   mMRC Score  3        Pulmonary Function Assessment: Pulmonary Function Assessment - 04/08/18 1616      Pulmonary Function Tests   FVC%  58 %    FEV1%  62 %    FEV1/FVC Ratio  108    RV%  35 %    DLCO%  30 %      Initial Spirometry Results   FVC%  58 %    FEV1%  62 %    FEV1/FVC Ratio  108      Breath   Bilateral Breath Sounds  Clear    Shortness of Breath  Yes   Mostly during walk test.       Exercise Target Goals: Exercise Program Goal: Individual exercise prescription set using results from initial 6 min walk test and THRR while considering  patient's activity barriers and safety.   Exercise Prescription Goal: Initial exercise prescription builds to 30-45 minutes a day of aerobic activity, 2-3 days per week.  Home exercise guidelines will be given to patient during program as part of exercise prescription that the participant will acknowledge.  Activity  Barriers & Risk Stratification:   6 Minute Walk: 6 Minute Walk    Row Name 04/08/18 1537         6 Minute Walk   Phase  Initial     Distance  800 feet     Walk Time  6 minutes     # of Rest Breaks  1     MPH  1.51     METS  2.16     RPE  13     Perceived Dyspnea   13     Resting HR  68 bpm     Resting BP  110/62     Resting Oxygen Saturation   91 %     Exercise Oxygen Saturation  during 6 min walk  88 %     Max Ex. HR  93 bpm     Max Ex. BP  130/64     2 Minute Post BP  124/62        Oxygen Initial Assessment: Oxygen Initial Assessment - 04/08/18 1615      Home Oxygen   Home Oxygen Device  None    Sleep Oxygen Prescription  None    Home Exercise Oxygen Prescription  None    Home at Rest Exercise Oxygen Prescription  None    Compliance with Home Oxygen Use  --   N/A     Initial 6 min Walk   Oxygen Used  None      Program Oxygen Prescription   Program Oxygen Prescription  None       Oxygen Re-Evaluation: Oxygen Re-Evaluation    Row Name 05/13/18 1549 06/03/18 1503           Program Oxygen Prescription   Program Oxygen Prescription  Continuous  Continuous      Liters per minute  4  4 Exercise O2 at 3 to 4 L/M.      FiO2%  92  90        Home Oxygen   Home Oxygen Device  Portable Concentrator  Portable Concentrator      Sleep Oxygen Prescription  Continuous  Continuous      Liters per minute  2  2      Home Exercise Oxygen Prescription  Continuous  Continuous      Liters per minute  4  4      Home at Rest Exercise Oxygen Prescription  Continuous  Continuous      Liters per minute  2  2      Compliance with Home Oxygen Use  Yes  Yes        Goals/Expected Outcomes   Short Term Goals  To learn and understand importance of monitoring SPO2 with pulse oximeter and demonstrate accurate use of the pulse oximeter.;To learn and exhibit compliance with exercise, home and travel O2 prescription;To learn and understand importance of maintaining oxygen  saturations>88%;To learn and demonstrate proper pursed lip breathing techniques or other breathing techniques.  To learn and understand importance of monitoring SPO2 with pulse oximeter and demonstrate accurate use of the pulse oximeter.;To learn and exhibit compliance with exercise, home and travel O2 prescription;To learn and understand importance of maintaining oxygen saturations>88%;To learn and demonstrate proper pursed lip breathing techniques or other breathing techniques.      Long  Term Goals  Exhibits compliance with exercise, home and travel O2 prescription;Verbalizes importance of monitoring SPO2 with pulse oximeter and return demonstration;Maintenance of O2 saturations>88%;Exhibits proper breathing techniques, such as pursed lip breathing or other method taught during program session  Exhibits compliance with exercise, home and travel O2 prescription;Verbalizes importance of monitoring SPO2 with pulse oximeter and return demonstration;Maintenance of O2 saturations>88%;Exhibits proper breathing techniques, such as pursed lip breathing or other method taught during program session      Comments  Patient was recently prescribed O2 at home. Not sure if she is supposed to use continuously or as needed. Will clarify this at her next visit. Patient demonstrates proper pursed lip breathing technique and proper usage of Pulse Oximetry and verbalizes understanding of maintaining her O2 Sat >88%. Will conitnue to monitor.   Patient was recently prescribed O2 at home. Not sure if she is supposed to use continuously or as needed. Will clarify this at her next visit. Patient demonstrates proper pursed lip breathing technique and proper usage of Pulse Oximetry and verbalizes understanding of maintaining her O2 Sat >88%. Will conitnue to monitor.       Goals/Expected Outcomes  Patient will continue to meet her short and long term goals.   Patient will continue to meet her short and long term goals.           Oxygen Discharge (Final Oxygen Re-Evaluation): Oxygen Re-Evaluation - 06/03/18 1503      Program Oxygen Prescription   Program Oxygen Prescription  Continuous    Liters per minute  4   Exercise O2 at 3 to 4 L/M.   FiO2%  90      Home Oxygen   Home Oxygen Device  Portable Concentrator    Sleep Oxygen Prescription  Continuous    Liters per minute  2    Home Exercise Oxygen Prescription  Continuous    Liters per minute  4    Home at Rest Exercise Oxygen Prescription  Continuous    Liters per minute  2    Compliance with Home Oxygen Use  Yes      Goals/Expected Outcomes   Short Term Goals  To learn and understand importance of monitoring SPO2 with pulse oximeter and demonstrate accurate use of the pulse oximeter.;To learn and exhibit compliance with exercise, home and travel O2 prescription;To learn and understand importance of maintaining oxygen saturations>88%;To learn and demonstrate proper pursed lip breathing techniques or other breathing techniques.    Long  Term Goals  Exhibits compliance with  exercise, home and travel O2 prescription;Verbalizes importance of monitoring SPO2 with pulse oximeter and return demonstration;Maintenance of O2 saturations>88%;Exhibits proper breathing techniques, such as pursed lip breathing or other method taught during program session    Comments  Patient was recently prescribed O2 at home. Not sure if she is supposed to use continuously or as needed. Will clarify this at her next visit. Patient demonstrates proper pursed lip breathing technique and proper usage of Pulse Oximetry and verbalizes understanding of maintaining her O2 Sat >88%. Will conitnue to monitor.     Goals/Expected Outcomes  Patient will continue to meet her short and long term goals.        Initial Exercise Prescription: Initial Exercise Prescription - 04/08/18 1500      Date of Initial Exercise RX and Referring Provider   Date  04/08/18    Referring Provider  Ramaswamy     Expected Discharge Date  07/24/18      Treadmill   MPH  0.8    Grade  0    Minutes  17    METs  1.6      NuStep   Level  1    SPM  42    Minutes  22    METs  1.7      Prescription Details   Frequency (times per week)  2    Duration  Progress to 30 minutes of continuous aerobic without signs/symptoms of physical distress      Intensity   THRR 40-80% of Max Heartrate  102-113-125    Ratings of Perceived Exertion  11-13    Perceived Dyspnea  0-4      Progression   Progression  Continue progressive overload as per policy without signs/symptoms or physical distress.      Resistance Training   Training Prescription  Yes    Weight  1    Reps  10-15       Perform Capillary Blood Glucose checks as needed.  Exercise Prescription Changes:  Exercise Prescription Changes    Row Name 04/22/18 0700 04/30/18 1400 05/27/18 0800         Response to Exercise   Blood Pressure (Admit)  132/60  102/60  102/62     Blood Pressure (Exercise)  152/68  132/68  126/64     Blood Pressure (Exit)  136/60  110/66  106/64     Heart Rate (Admit)  68 bpm  77 bpm  96 bpm     Heart Rate (Exercise)  96 bpm  79 bpm  113 bpm     Heart Rate (Exit)  76 bpm  79 bpm  103 bpm     Oxygen Saturation (Admit)  90 %  93 %  97 %     Oxygen Saturation (Exercise)  97 %  90 %  93 %     Oxygen Saturation (Exit)  93 %  93 %  97 %     Rating of Perceived Exertion (Exercise)  _0 Perceived Dyspnea (Exercise)  _1 Symptoms  - SOB  -  -     Duration  Progress to 30 minutes of  aerobic without signs/symptoms of physical distress  Progress to 30 minutes of  aerobic without signs/symptoms of physical distress  Progress to 30 minutes of  aerobic without signs/symptoms of physical distress     Intensity  THRR New 95-109-122  THRR unchanged  THRR  unchanged       Progression   Progression  Continue to progress workloads to maintain intensity without signs/symptoms of physical distress.  Continue to  progress workloads to maintain intensity without signs/symptoms of physical distress.  Continue to progress workloads to maintain intensity without signs/symptoms of physical distress.     Average METs  1.98  1.5  1.78       Resistance Training   Training Prescription  No  Yes  Yes     Weight  _0 Reps  10-15  10-15  10-15     Time  5 Minutes  5 Minutes  5 Minutes       Oxygen   Oxygen  -  -  Continuous     Liters  -  -  2       Treadmill   MPH  0.8  0.8  1     Grade  0  0  0     Minutes  _1 METs  1.6  1.61  1.76       NuStep   Level  _2 SPM  62  84  85     Minutes  _3 METs  1.6  1.5  1.8        Exercise Comments:  Exercise Comments    Row Name 04/22/18 0802 04/30/18 1454 05/14/18 0749 06/01/18 1322     Exercise Comments  Patient is not feeling her best due to her stomach issues. She wants to come and wants to get stronger.   Patient still experiences weakness due to stomach pain but has continued to attend rehab to reach her goals.   Patient is doing better. She needs to be more consistent.   Patient has been attending regularly and seems to be getting stronger. Supplemental oxygen has helped her a lot.        Exercise Goals and Review:  Exercise Goals    Row Name 04/08/18 1553             Exercise Goals   Increase Physical Activity  Yes       Intervention  Provide advice, education, support and counseling about physical activity/exercise needs.;Develop an individualized exercise prescription for aerobic and resistive training based on initial evaluation findings, risk stratification, comorbidities and participant's personal goals.       Expected Outcomes  Short Term: Attend rehab on a regular basis to increase amount of physical activity.;Long Term: Add in home exercise to make exercise part of routine and to increase amount of physical activity.       Increase Strength and Stamina  Yes       Intervention  Provide advice,  education, support and counseling about physical activity/exercise needs.;Develop an individualized exercise prescription for aerobic and resistive training based on initial evaluation findings, risk stratification, comorbidities and participant's personal goals.       Expected Outcomes  Short Term: Increase workloads from initial exercise prescription for resistance, speed, and METs.;Long Term: Improve cardiorespiratory fitness, muscular endurance and strength as measured by increased METs and functional capacity (6MWT)       Able to understand and use rate of perceived exertion (RPE) scale  Yes       Intervention  Provide education and explanation on how to use RPE scale  Expected Outcomes  Short Term: Able to use RPE daily in rehab to express subjective intensity level;Long Term:  Able to use RPE to guide intensity level when exercising independently       Able to understand and use Dyspnea scale  Yes       Intervention  Provide education and explanation on how to use Dyspnea scale       Expected Outcomes  Short Term: Able to use Dyspnea scale daily in rehab to express subjective sense of shortness of breath during exertion;Long Term: Able to use Dyspnea scale to guide intensity level when exercising independently       Knowledge and understanding of Target Heart Rate Range (THRR)  Yes       Intervention  Provide education and explanation of THRR including how the numbers were predicted and where they are located for reference       Expected Outcomes  Short Term: Able to use daily as guideline for intensity in rehab;Long Term: Able to use THRR to govern intensity when exercising independently       Able to check pulse independently  Yes       Intervention  Provide education and demonstration on how to check pulse in carotid and radial arteries.;Review the importance of being able to check your own pulse for safety during independent exercise       Expected Outcomes  Short Term: Able to explain  why pulse checking is important during independent exercise;Long Term: Able to check pulse independently and accurately       Understanding of Exercise Prescription  Yes       Intervention  Provide education, explanation, and written materials on patient's individual exercise prescription       Expected Outcomes  Short Term: Able to explain program exercise prescription;Long Term: Able to explain home exercise prescription to exercise independently          Exercise Goals Re-Evaluation : Exercise Goals Re-Evaluation    Row Name 04/22/18 0752 04/30/18 1452 05/14/18 0745 06/01/18 1320       Exercise Goal Re-Evaluation   Exercise Goals Review  Increase Physical Activity;Increase Strength and Stamina;Able to understand and use Dyspnea scale;Understanding of Exercise Prescription;Knowledge and understanding of Target Heart Rate Range (THRR)  Increase Physical Activity;Increase Strength and Stamina;Able to understand and use Dyspnea scale;Understanding of Exercise Prescription;Knowledge and understanding of Target Heart Rate Range (THRR)  Increase Physical Activity;Increase Strength and Stamina;Able to understand and use Dyspnea scale;Understanding of Exercise Prescription;Knowledge and understanding of Target Heart Rate Range (THRR)  Increase Physical Activity;Increase Strength and Stamina;Able to understand and use Dyspnea scale;Understanding of Exercise Prescription;Knowledge and understanding of Target Heart Rate Range (THRR)    Comments  Patient is still new to the program. She is having some stomach issues that makes it difficult for her to eat. When she comes she is sometimes weak because of not eating. She is seeing her doctor to determine what the problem is. She has not been progressed yet due to having doctors appointments about her stomach. She does like coming to the program and hopes it will help her reach her goals.   Patient has had 4 visits so far. She is still weak due to her stomach pains  but will hopefully have those issues resolved soon.   Patient has had 6 visits so far. She is still weak due to her last episode of having urinary track infection. She is on an antibiotics and they have added supplemental  oxygen. Will  clarify the liter flow on her next visit.   Patient has been doing well in the program since being place on oxygen. She is working out on [employer]. She seems to be getting around better with it although she doesn't want it. Will continue to monitor and progress.     Expected Outcomes  Get energy back, iIncrease appetite and continue to get stronger.   Get energy back, iIncrease appetite and continue to get stronger.   Get energy back, iIncrease appetite and continue to get stronger.   Increase energy levels, appetite and get stronger.        Discharge Exercise Prescription (Final Exercise Prescription Changes): Exercise Prescription Changes - 05/27/18 0800      Response to Exercise   Blood Pressure (Admit)  102/62    Blood Pressure (Exercise)  126/64    Blood Pressure (Exit)  106/64    Heart Rate (Admit)  96 bpm    Heart Rate (Exercise)  113 bpm    Heart Rate (Exit)  103 bpm    Oxygen Saturation (Admit)  97 %    Oxygen Saturation (Exercise)  93 %    Oxygen Saturation (Exit)  97 %    Rating of Perceived Exertion (Exercise)  11    Perceived Dyspnea (Exercise)  11    Duration  Progress to 30 minutes of  aerobic without signs/symptoms of physical distress    Intensity  THRR unchanged      Progression   Progression  Continue to progress workloads to maintain intensity without signs/symptoms of physical distress.    Average METs  1.78      Resistance Training   Training Prescription  Yes    Weight  2    Reps  10-15    Time  5 Minutes      Oxygen   Oxygen  Continuous    Liters  2      Treadmill   MPH  1    Grade  0    Minutes  17    METs  1.76      NuStep   Level  1    SPM  85    Minutes  22    METs  1.8       Nutrition:  Target Goals:  Understanding of nutrition guidelines, daily intake of sodium <1569m, cholesterol <2015m calories 30% from fat and 7% or less from saturated fats, daily to have 5 or more servings of fruits and vegetables.  Biometrics: Pre Biometrics - 04/08/18 1610      Pre Biometrics   Height  _0  (1.651 m)    Weight  69.3 kg    Waist Circumference  26 inches    Hip Circumference  29 inches    Waist to Hip Ratio  0.9 %    BMI (Calculated)  25.41    Triceps Skinfold  6 mm    % Body Fat  15 %    Grip Strength  32 kg    Flexibility  0 in    Single Leg Stand  10 seconds        Nutrition Therapy Plan and Nutrition Goals: Nutrition Therapy & Goals - 06/03/18 1505      Nutrition Therapy   RD appointment deferred  Yes      Personal Nutrition Goals   Additional Goals?  No    Comments  Patient says she is trying to eat a heart healthy diet.  Her appetite has improved with treatment  of nauses. She has gained 4 lbs and is cooking more now. Will continue to monitor for progress.        Nutrition Assessments: Nutrition Assessments - 04/08/18 1620      MEDFICTS Scores   Pre Score  27       Nutrition Goals Re-Evaluation:   Nutrition Goals Discharge (Final Nutrition Goals Re-Evaluation):   Psychosocial: Target Goals: Acknowledge presence or absence of significant depression and/or stress, maximize coping skills, provide positive support system. Participant is able to verbalize types and ability to use techniques and skills needed for reducing stress and depression.  Initial Review & Psychosocial Screening: Initial Psych Review & Screening - 04/08/18 1612      Initial Review   Current issues with  None Identified      Family Dynamics   Good Support System?  Yes      Barriers   Psychosocial barriers to participate in program  There are no identifiable barriers or psychosocial needs.      Screening Interventions   Interventions  Encouraged to exercise    Expected Outcomes  Short Term  goal: Identification and review with participant of any Quality of Life or Depression concerns found by scoring the questionnaire.;Long Term goal: The participant improves quality of Life and PHQ9 Scores as seen by post scores and/or verbalization of changes       Quality of Life Scores: Quality of Life - 04/08/18 1613      Quality of Life   Select  Quality of Life      Quality of Life Scores   Health/Function Pre  20.38 %    Socioeconomic Pre  19.71 %    Psych/Spiritual Pre  20.14 %    Family Pre  20.6 %    GLOBAL Pre  20.23 %      Scores of 19 and below usually indicate a poorer quality of life in these areas.  A difference of  2-3 points is a clinically meaningful difference.  A difference of 2-3 points in the total score of the Quality of Life Index has been associated with significant improvement in overall quality of life, self-image, physical symptoms, and general health in studies assessing change in quality of life.   PHQ-9: Recent Review Flowsheet Data    Depression screen [facility] 2/9 04/08/2018   Decreased Interest 2   Down, Depressed, Hopeless 1   PHQ - 2 Score 3   Altered sleeping 0   Tired, decreased energy 1   Change in appetite 1   Feeling bad or failure about yourself  1   Trouble concentrating 1   Moving slowly or fidgety/restless 0   Suicidal thoughts 0   PHQ-9 Score 7   Difficult doing work/chores Somewhat difficult     Interpretation of Total Score  Total Score Depression Severity:  1-4 = Minimal depression, 5-9 = Mild depression, 10-14 = Moderate depression, 15-19 = Moderately severe depression, 20-27 = Severe depression   Psychosocial Evaluation and Intervention: Psychosocial Evaluation - 04/08/18 1613      Psychosocial Evaluation & Interventions   Interventions  Encouraged to exercise with the program and follow exercise prescription    Continue Psychosocial Services   No Follow up required       Psychosocial Re-Evaluation: Psychosocial  Re-Evaluation    [place] Name 05/13/18 1557 06/03/18 1511           Psychosocial Re-Evaluation   Current issues with  Current Depression  Current Depression  Comments  Patient's initial QOL score was 20.33 and her PHQ-9 score was 7. She says she is depressed but refuses any treatment. She feels she is able to manage it on her own.   Patient's initial QOL score was 20.33 and her PHQ-9 score was 7. She says she feels her depression has improved especially since her appetite is improving.       Expected Outcomes  Patient's QOL and PHQ-9 score will improve and she will verbalize improvement in her depression at discharge.   Patient's QOL and PHQ-9 score will improve and she will verbalize improvement in her depression at discharge.       Interventions  Stress management education;Encouraged to attend Pulmonary Rehabilitation for the exercise;Relaxation education  Stress management education;Encouraged to attend Pulmonary Rehabilitation for the exercise;Relaxation education      Continue Psychosocial Services   Follow up required by staff  Follow up required by staff         Psychosocial Discharge (Final Psychosocial Re-Evaluation): Psychosocial Re-Evaluation - 06/03/18 1511      Psychosocial Re-Evaluation   Current issues with  Current Depression    Comments  Patient's initial QOL score was 20.33 and her PHQ-9 score was 7. She says she feels her depression has improved especially since her appetite is improving.     Expected Outcomes  Patient's QOL and PHQ-9 score will improve and she will verbalize improvement in her depression at discharge.     Interventions  Stress management education;Encouraged to attend Pulmonary Rehabilitation for the exercise;Relaxation education    Continue Psychosocial Services   Follow up required by staff        Education: Education Goals: Education classes will be provided on a weekly basis, covering required topics. Participant will state understanding/return  demonstration of topics presented.  Learning Barriers/Preferences: Learning Barriers/Preferences - 04/08/18 1520      Learning Barriers/Preferences   Learning Barriers  None    Learning Preferences  Skilled Demonstration;Group Instruction;Verbal Instruction       Education Topics: How Lungs Work and Diseases: - Discuss the anatomy of the lungs and diseases that can affect the lungs, such as COPD.   Exercise: -Discuss the importance of exercise, FITT principles of exercise, normal and abnormal responses to exercise, and how to exercise safely.   Environmental Irritants: -Discuss types of environmental irritants and how to limit exposure to environmental irritants.   Meds/Inhalers and oxygen: - Discuss respiratory medications, definition of an inhaler and oxygen, and the proper way to use an inhaler and oxygen.   PULMONARY REHAB OTHER RESPIRATORY from 06/03/2018 in [place]  Date  04/15/18  Educator  D. Wynetta Emery      Energy Saving Techniques: - Discuss methods to conserve energy and decrease shortness of breath when performing activities of daily living.    Bronchial Hygiene / Breathing Techniques: - Discuss breathing mechanics, pursed-lip breathing technique,  proper posture, effective ways to clear airways, and other functional breathing techniques   PULMONARY REHAB OTHER RESPIRATORY from 06/03/2018 in [place]  Date  04/29/18  Educator  Etheleen Mayhew  Instruction Review Code  2- Demonstrated Understanding      Cleaning Equipment: - Provides group verbal and written instruction about the health risks of elevated stress, cause of high stress, and healthy ways to reduce stress.   Nutrition I: Fats: - Discuss the types of cholesterol, what cholesterol does to the body, and how cholesterol levels can be controlled.   Nutrition II: Labels: -Discuss  the different components of food labels and how to read food labels.    PULMONARY REHAB OTHER RESPIRATORY from 06/03/2018 in [place]  Date  05/20/18  Educator  Etheleen Mayhew  Instruction Review Code  2- Demonstrated Understanding      Respiratory Infections: - Discuss the signs and symptoms of respiratory infections, ways to prevent respiratory infections, and the importance of seeking medical treatment when having a respiratory infection.   PULMONARY REHAB OTHER RESPIRATORY from 06/03/2018 in [place]  Date  06/03/18  Educator  Etheleen Mayhew  Instruction Review Code  2- Demonstrated Understanding      Stress I: Signs and Symptoms: - Discuss the causes of stress, how stress may lead to anxiety and depression, and ways to limit stress.   Stress II: Relaxation: -Discuss relaxation techniques to limit stress.   Oxygen for Home/Travel: - Discuss how to prepare for travel when on oxygen and proper ways to transport and store oxygen to ensure safety.   Knowledge Questionnaire Score: Knowledge Questionnaire Score - 04/08/18 1621      Knowledge Questionnaire Score   Pre Score  12/18       Core Components/Risk Factors/Patient Goals at Admission: Personal Goals and Risk Factors at Admission - 04/08/18 1621      Core Components/Risk Factors/Patient Goals on Admission    Weight Management  Weight Maintenance    Personal Goal Other  Yes    Personal Goal  Gain energy, increase appetite, continue to get stronger through PR program    Intervention  Attend class 2 x week, supplement with 3 x week exercise at home.     Expected Outcomes  Reach personal goals.        Core Components/Risk Factors/Patient Goals Review:  Goals and Risk Factor Review    Row Name 05/13/18 1553 06/03/18 1506           Core Components/Risk Factors/Patient Goals Review   Personal Goals Review  Improve shortness of breath with ADL's;Other Get energy back; increase appetite.   Improve shortness of breath with ADL's;Other Get energy  back; increase appetite.       Review  Patient has completed 6 sessions losing 4 lbs since her initial visit. Her attendance has been inconsistent due to illness, MD appointments and procedures. She recently has a UTI and was prescribed oxygen therapy. Her progress in the program has been impeded by her inconsistent attendance. Will continue to monitor for progress.   Patient has completed 11 sessions maintaining her weight since last 30 day review; however, she says she has gained 4 lbs on her scales in recent weeks. Her appetite has improved and she is cooking more now. Her gastroenterologist adjusted her medications treating her nausea. She does feel better. She says she does feel stronger and is breathing better since they put her on O2. Will continue to monitor for progress.       Expected Outcomes  Patient will continue to attend sessions and complete the program and meet her personal goals.   Patient will continue to attend sessions and complete the program and meet her personal goals.          Core Components/Risk Factors/Patient Goals at Discharge (Final Review):  Goals and Risk Factor Review - 06/03/18 1506      Core Components/Risk Factors/Patient Goals Review   Personal Goals Review  Improve shortness of breath with ADL's;Other   Get energy back; increase appetite.    Review  Patient has  completed 11 sessions maintaining her weight since last 30 day review; however, she says she has gained 4 lbs on her scales in recent weeks. Her appetite has improved and she is cooking more now. Her gastroenterologist adjusted her medications treating her nausea. She does feel better. She says she does feel stronger and is breathing better since they put her on O2. Will continue to monitor for progress.     Expected Outcomes  Patient will continue to attend sessions and complete the program and meet her personal goals.        ITP Comments: ITP Comments    Row Name 04/22/18 1555           ITP  Comments  Patient is new to the program. She has completed 3 sessions. She has been out for this week due to having an endoscopy procedure done on 04/20/18 with 2 biopsy's taken and dilation of esophagus with wide spread inflammation found of her stomach. Also had liver scan with fatty liver. Will have a follow up liver scan in 3 to 6 months. Will continue to monitor for progress.           Comments: ITP REVIEW Pt is making expected progress toward pulmonary rehab goals after completing 11 sessions. Recommend continued exercise, life style modification, education, and utilization of breathing techniques to increase stamina and strength and decrease shortness of breath with exertion.

## 2018-06-03 NOTE — Progress Notes (Signed)
Daily Session Note  Patient Details  Name: Ashlee Martin MRN: [id number] Date of Birth: 09-Apr-1931 Referring Provider:     PULMONARY REHAB OTHER RESP ORIENTATION from 04/08/2018 in [place]  Referring Provider  Ramaswamy      Encounter Date: 06/03/2018  Check In: Session Check In - 06/03/18 1330      Check-In   Supervising physician immediately available to respond to emergencies  See telemetry face sheet for immediately available MD    Location  [facility]    Staff Present  [name], Exercise Physiologist;Kyriaki Moder Wynetta Emery, RN, BSN    Medication changes reported      No    Fall or balance concerns reported     No    Warm-up and Cool-down  Performed as group-led instruction    Resistance Training Performed  Yes    VAD Patient?  No    PAD/SET Patient?  No      Pain Assessment   Currently in Pain?  No/denies    Pain Score  0-No pain    Multiple Pain Sites  No       Capillary Blood Glucose: No results found for this or any previous visit (from the past 24 hour(s)).    Social History   Tobacco Use  Smoking Status Former Smoker  . Packs/day: 3.00  . Years: 20.00  . Pack years: 60.00  . Types: Cigarettes  . Last attempt to quit: 11/03/1972  . Years since quitting: 45.6  Smokeless Tobacco Never Used    Goals Met:  Proper associated with RPD/PD & O2 Sat Independence with exercise equipment Improved SOB with ADL's Using PLB without cueing & demonstrates good technique Exercise tolerated well No report of cardiac concerns or symptoms  Goals Unmet:  Not Applicable  Comments: Pt able to follow exercise prescription today without complaint.  Will continue to monitor for progression. Check out 1430.   Dr. Sinda Du is Medical Director for [facility] Pul[facility] Re[facility].

## 2018-06-06 DIAGNOSIS — M1611 Unilateral primary osteoarthritis, right hip: Secondary | ICD-10-CM | POA: Diagnosis not present

## 2018-06-06 DIAGNOSIS — Z96649 Presence of unspecified artificial hip joint: Secondary | ICD-10-CM | POA: Diagnosis not present

## 2018-06-06 DIAGNOSIS — J849 Interstitial pulmonary disease, unspecified: Secondary | ICD-10-CM | POA: Diagnosis not present

## 2018-06-06 DIAGNOSIS — R0902 Hypoxemia: Secondary | ICD-10-CM | POA: Diagnosis not present

## 2018-06-08 ENCOUNTER — Encounter (HOSPITAL_COMMUNITY)
Admission: RE | Admit: 2018-06-08 | Discharge: 2018-06-08 | Disposition: A | Discharge status: Home / Self Care | Payer: Medicare HMO | Source: Ambulatory Visit | Attending: Internal Medicine | Admitting: Internal Medicine

## 2018-06-08 DIAGNOSIS — Z7982 Long term (current) use of aspirin: Secondary | ICD-10-CM | POA: Diagnosis not present

## 2018-06-08 DIAGNOSIS — Z87891 Personal history of nicotine dependence: Secondary | ICD-10-CM | POA: Diagnosis not present

## 2018-06-08 DIAGNOSIS — Z79899 Other long term (current) drug therapy: Secondary | ICD-10-CM | POA: Diagnosis not present

## 2018-06-08 DIAGNOSIS — J849 Interstitial pulmonary disease, unspecified: Secondary | ICD-10-CM

## 2018-06-08 NOTE — Progress Notes (Signed)
Daily Session Note  Patient Details  Name: Ashlee Martin MRN: [id number] Date of Birth: [birth year] Referring Provider:     PULMONARY REHAB OTHER RESP ORIENTATION from 04/08/2018 in [place]  Referring Provider  Ramaswamy      Encounter Date: 06/08/2018  Check In: Session Check In - 06/08/18 1330      Check-In   Supervising physician immediately available to respond to emergencies  See telemetry face sheet for immediately available MD    Location  [facility]    Staff Present  Russella Dar, MS, EP, [facility], Exercise Physiologist;Yoselyn Mcglade Zachery Conch, Exercise Physiologist    Medication changes reported      No    Fall or balance concerns reported     No    Tobacco Cessation  No Change    Warm-up and Cool-down  Performed as group-led instruction    Resistance Training Performed  Yes    VAD Patient?  No    PAD/SET Patient?  No      Pain Assessment   Currently in Pain?  No/denies    Pain Score  0-No pain    Multiple Pain Sites  No       Capillary Blood Glucose: No results found for this or any previous visit (from the past 24 hour(s)).    Social History   Tobacco Use  Smoking Status Former Smoker  . Packs/day: 3.00  . Years: 20.00  . Pack years: 60.00  . Types: Cigarettes  . Last attempt to quit: 11/03/1972  . Years since quitting: 45.6  Smokeless Tobacco Never Used    Goals Met:  Proper associated with RPD/PD & O2 Sat Independence with exercise equipment Using PLB without cueing & demonstrates good technique Exercise tolerated well No report of cardiac concerns or symptoms Strength training completed today  Goals Unmet:  Not Applicable  Comments: Pt able to follow exercise prescription today without complaint.  Will continue to monitor for progression. Check out 2:30.   Dr. Sinda Du is Medical Director for [facility] Pulmonary Rehab.

## 2018-06-09 DIAGNOSIS — Z299 Encounter for prophylactic measures, unspecified: Secondary | ICD-10-CM | POA: Diagnosis not present

## 2018-06-09 DIAGNOSIS — Z6825 Body mass index (BMI) 25.0-25.9, adult: Secondary | ICD-10-CM | POA: Diagnosis not present

## 2018-06-09 DIAGNOSIS — I10 Essential (primary) hypertension: Secondary | ICD-10-CM | POA: Diagnosis not present

## 2018-06-09 DIAGNOSIS — Z87891 Personal history of nicotine dependence: Secondary | ICD-10-CM | POA: Diagnosis not present

## 2018-06-09 DIAGNOSIS — M542 Cervicalgia: Secondary | ICD-10-CM | POA: Diagnosis not present

## 2018-06-09 DIAGNOSIS — J8410 Pulmonary fibrosis, unspecified: Secondary | ICD-10-CM | POA: Diagnosis not present

## 2018-06-10 ENCOUNTER — Encounter (HOSPITAL_COMMUNITY)
Admission: RE | Admit: 2018-06-10 | Discharge: 2018-06-10 | Disposition: A | Discharge status: Home / Self Care | Payer: Medicare HMO | Source: Ambulatory Visit | Attending: Internal Medicine | Admitting: Internal Medicine

## 2018-06-10 ENCOUNTER — Ambulatory Visit: Payer: Medicare HMO | Admitting: Internal Medicine

## 2018-06-10 DIAGNOSIS — Z87891 Personal history of nicotine dependence: Secondary | ICD-10-CM | POA: Diagnosis not present

## 2018-06-10 DIAGNOSIS — J849 Interstitial pulmonary disease, unspecified: Secondary | ICD-10-CM

## 2018-06-10 DIAGNOSIS — Z79899 Other long term (current) drug therapy: Secondary | ICD-10-CM | POA: Diagnosis not present

## 2018-06-10 DIAGNOSIS — Z7982 Long term (current) use of aspirin: Secondary | ICD-10-CM | POA: Diagnosis not present

## 2018-06-10 NOTE — Progress Notes (Signed)
Daily Session Note  Patient Details  Name: Ashlee Martin MRN: [id number] Date of Birth: June 07, 1931 Referring Provider:     PULMONARY REHAB OTHER RESP ORIENTATION from 04/08/2018 in [place]  Referring Provider  Ramaswamy      Encounter Date: 06/10/2018  Check In: Session Check In - 06/10/18 1330      Check-In   Supervising physician immediately available to respond to emergencies  See telemetry face sheet for immediately available MD    Location  [facility]    Staff Present  Russella Dar, MS, EP, [facility], Exercise Physiologist;Leigh Blas, Exercise Physiologist;Debra Wynetta Emery, RN, BSN    Medication changes reported      No    Fall or balance concerns reported     No    Tobacco Cessation  No Change    Warm-up and Cool-down  Performed as group-led instruction    Resistance Training Performed  Yes    VAD Patient?  No    PAD/SET Patient?  No      Pain Assessment   Currently in Pain?  No/denies    Pain Score  0-No pain    Multiple Pain Sites  No       Capillary Blood Glucose: No results found for this or any previous visit (from the past 24 hour(s)).    Social History   Tobacco Use  Smoking Status Former Smoker  . Packs/day: 3.00  . Years: 20.00  . Pack years: 60.00  . Types: Cigarettes  . Last attempt to quit: 11/03/1972  . Years since quitting: 45.6  Smokeless Tobacco Never Used    Goals Met:  Proper associated with RPD/PD & O2 Sat Independence with exercise equipment Exercise tolerated well No report of cardiac concerns or symptoms Strength training completed today  Goals Unmet:  Not Applicable  Comments: Pt able to follow exercise prescription today without complaint.  Will continue to monitor for progression. Check out 2:30.   Dr. Sinda Du is Medical Director for [facility] Pulmonary Rehab.

## 2018-06-15 ENCOUNTER — Encounter (HOSPITAL_COMMUNITY)
Admission: RE | Admit: 2018-06-15 | Discharge: 2018-06-15 | Disposition: A | Discharge status: Home / Self Care | Payer: Medicare HMO | Source: Ambulatory Visit | Attending: Internal Medicine | Admitting: Internal Medicine

## 2018-06-15 ENCOUNTER — Ambulatory Visit (HOSPITAL_COMMUNITY)
Admission: RE | Admit: 2018-06-15 | Discharge: 2018-06-15 | Disposition: A | Discharge status: Home / Self Care | Payer: Medicare HMO | Source: Ambulatory Visit | Attending: Internal Medicine | Admitting: Internal Medicine

## 2018-06-15 DIAGNOSIS — I2510 Atherosclerotic heart disease of native coronary artery without angina pectoris: Secondary | ICD-10-CM | POA: Diagnosis not present

## 2018-06-15 DIAGNOSIS — J849 Interstitial pulmonary disease, unspecified: Secondary | ICD-10-CM

## 2018-06-15 DIAGNOSIS — I700 Atherosclerosis of aorta: Secondary | ICD-10-CM | POA: Insufficient documentation

## 2018-06-15 DIAGNOSIS — R911 Solitary pulmonary nodule: Secondary | ICD-10-CM | POA: Diagnosis not present

## 2018-06-15 NOTE — Progress Notes (Signed)
Daily Session Note  Patient Details  Name: DESTINI CAMBRE MRN: [id number] Date of Birth: 10-Apr-1931 Referring Provider:     PULMONARY REHAB OTHER RESP ORIENTATION from 04/08/2018 in [place]  Referring Provider  Ramaswamy      Encounter Date: 06/15/2018  Check In: Session Check In - 06/15/18 1330      Check-In   Supervising physician immediately available to respond to emergencies  See telemetry face sheet for immediately available MD    Location  [facility]    Staff Present  Russella Dar, MS, EP, [facility], Exercise Physiologist;Elba Dendinger Zachery Conch, Exercise Physiologist    Medication changes reported      No    Fall or balance concerns reported     No    Tobacco Cessation  No Change    Warm-up and Cool-down  Performed as group-led instruction    Resistance Training Performed  No   Arrived late   VAD Patient?  No    PAD/SET Patient?  No      Pain Assessment   Currently in Pain?  No/denies    Pain Score  0-No pain    Multiple Pain Sites  No       Capillary Blood Glucose: No results found for this or any previous visit (from the past 24 hour(s)).    Social History   Tobacco Use  Smoking Status Former Smoker  . Packs/day: 3.00  . Years: 20.00  . Pack years: 60.00  . Types: Cigarettes  . Last attempt to quit: 11/03/1972  . Years since quitting: 45.6  Smokeless Tobacco Never Used    Goals Met:  Proper associated with RPD/PD & O2 Sat Independence with exercise equipment Using PLB without cueing & demonstrates good technique Exercise tolerated well No report of cardiac concerns or symptoms Strength training completed today  Goals Unmet:  Not Applicable  Comments: Pt able to follow exercise prescription today without complaint.  Will continue to monitor for progression. Check out 2:30.   Dr. Sinda Du is Medical Director for [facility] Pulmonary Rehab.

## 2018-06-16 ENCOUNTER — Ambulatory Visit (HOSPITAL_COMMUNITY)
Admission: RE | Admit: 2018-06-16 | Discharge: 2018-06-16 | Disposition: A | Discharge status: Home / Self Care | Payer: Medicare HMO | Source: Ambulatory Visit | Attending: Internal Medicine | Admitting: Internal Medicine

## 2018-06-16 DIAGNOSIS — R918 Other nonspecific abnormal finding of lung field: Secondary | ICD-10-CM | POA: Insufficient documentation

## 2018-06-16 DIAGNOSIS — J84112 Idiopathic pulmonary fibrosis: Secondary | ICD-10-CM | POA: Diagnosis not present

## 2018-06-16 DIAGNOSIS — Z87891 Personal history of nicotine dependence: Secondary | ICD-10-CM | POA: Diagnosis not present

## 2018-06-16 LAB — PULMONARY FUNCTION TEST
DL/VA % pred: 40 %
DL/VA: 2.01 ml/min/mmHg/L
DLCO unc % pred: 23 %
DLCO unc: 6.02 ml/min/mmHg
FEF 25-75 PRE: 1.34 L/s
FEF2575-%Pred-Pre: 106 %
FEV1-%Pred-Pre: 66 %
FEV1-PRE: 1.27 L
FEV1FVC-%Pred-Pre: 113 %
FEV6-%PRED-PRE: 63 %
FEV6-PRE: 1.54 L
FEV6FVC-%PRED-PRE: 106 %
FVC-%PRED-PRE: 60 %
FVC-PRE: 1.54 L
PRE FEV1/FVC RATIO: 83 %
Pre FEV6/FVC Ratio: 100 %

## 2018-06-17 ENCOUNTER — Encounter (HOSPITAL_COMMUNITY)
Admission: RE | Admit: 2018-06-17 | Discharge: 2018-06-17 | Disposition: A | Discharge status: Home / Self Care | Payer: Medicare HMO | Source: Ambulatory Visit | Attending: Internal Medicine | Admitting: Internal Medicine

## 2018-06-17 DIAGNOSIS — Z79899 Other long term (current) drug therapy: Secondary | ICD-10-CM | POA: Diagnosis not present

## 2018-06-17 DIAGNOSIS — Z87891 Personal history of nicotine dependence: Secondary | ICD-10-CM | POA: Diagnosis not present

## 2018-06-17 DIAGNOSIS — Z7982 Long term (current) use of aspirin: Secondary | ICD-10-CM | POA: Diagnosis not present

## 2018-06-17 DIAGNOSIS — J849 Interstitial pulmonary disease, unspecified: Secondary | ICD-10-CM | POA: Diagnosis not present

## 2018-06-17 NOTE — Progress Notes (Signed)
Daily Session Note  Patient Details  Name: Ashlee Martin MRN: [id number] Date of Birth: May 12, 1931 Referring Provider:     PULMONARY REHAB OTHER RESP ORIENTATION from 04/08/2018 in [place]  Referring Provider  Ramaswamy      Encounter Date: 06/17/2018  Check In: Session Check In - 06/17/18 1330      Check-In   Supervising physician immediately available to respond to emergencies  See telemetry face sheet for immediately available MD    Location  [facility]    Staff Present  [name], Exercise Physiologist;Debra Wynetta Emery, RN, BSN    Medication changes reported      No    Fall or balance concerns reported     No    Tobacco Cessation  No Change    Warm-up and Cool-down  Performed as group-led instruction    Resistance Training Performed  Yes    VAD Patient?  No    PAD/SET Patient?  No      Pain Assessment   Currently in Pain?  No/denies    Pain Score  0-No pain    Multiple Pain Sites  No       Capillary Blood Glucose: No results found for this or any previous visit (from the past 24 hour(s)).    Social History   Tobacco Use  Smoking Status Former Smoker  . Packs/day: 3.00  . Years: 20.00  . Pack years: 60.00  . Types: Cigarettes  . Last attempt to quit: 11/03/1972  . Years since quitting: 45.6  Smokeless Tobacco Never Used    Goals Met:  Proper associated with RPD/PD & O2 Sat Independence with exercise equipment Exercise tolerated well No report of cardiac concerns or symptoms Strength training completed today  Goals Unmet:  Not Applicable  Comments: Pt able to follow exercise prescription today without complaint.  Will continue to monitor for progression. Check out 2:30.   Dr. Sinda Du is Medical Director for [facility] Pulmonary Rehab.

## 2018-06-19 ENCOUNTER — Other Ambulatory Visit: Payer: Self-pay | Admitting: Internal Medicine

## 2018-06-21 DIAGNOSIS — M1812 Unilateral primary osteoarthritis of first carpometacarpal joint, left hand: Secondary | ICD-10-CM | POA: Diagnosis not present

## 2018-06-22 ENCOUNTER — Encounter (HOSPITAL_COMMUNITY): Payer: Medicare HMO

## 2018-06-22 ENCOUNTER — Telehealth: Payer: Self-pay | Admitting: Gastroenterology

## 2018-06-22 NOTE — Telephone Encounter (Signed)
Letter mailed

## 2018-06-22 NOTE — Telephone Encounter (Signed)
RECALL FOR ULTRASOUND 

## 2018-06-23 ENCOUNTER — Ambulatory Visit: Payer: Medicare HMO | Admitting: Gastroenterology

## 2018-06-23 DIAGNOSIS — Z6824 Body mass index (BMI) 24.0-24.9, adult: Secondary | ICD-10-CM | POA: Diagnosis not present

## 2018-06-23 DIAGNOSIS — Z299 Encounter for prophylactic measures, unspecified: Secondary | ICD-10-CM | POA: Diagnosis not present

## 2018-06-23 DIAGNOSIS — M542 Cervicalgia: Secondary | ICD-10-CM | POA: Diagnosis not present

## 2018-06-23 DIAGNOSIS — I10 Essential (primary) hypertension: Secondary | ICD-10-CM | POA: Diagnosis not present

## 2018-06-23 DIAGNOSIS — J8410 Pulmonary fibrosis, unspecified: Secondary | ICD-10-CM | POA: Diagnosis not present

## 2018-06-24 ENCOUNTER — Encounter (HOSPITAL_COMMUNITY): Payer: Medicare HMO

## 2018-06-24 ENCOUNTER — Ambulatory Visit (INDEPENDENT_AMBULATORY_CARE_PROVIDER_SITE_OTHER): Payer: Medicare HMO | Admitting: Internal Medicine

## 2018-06-24 ENCOUNTER — Encounter: Payer: Self-pay | Admitting: Internal Medicine

## 2018-06-24 VITALS — BP 120/78 | HR 74 | Ht 65.0 in | Wt 149.4 lb

## 2018-06-24 DIAGNOSIS — T50905A Adverse effect of unspecified drugs, medicaments and biological substances, initial encounter: Secondary | ICD-10-CM

## 2018-06-24 DIAGNOSIS — R634 Abnormal weight loss: Secondary | ICD-10-CM

## 2018-06-24 DIAGNOSIS — J84112 Idiopathic pulmonary fibrosis: Secondary | ICD-10-CM

## 2018-06-24 DIAGNOSIS — R05 Cough: Secondary | ICD-10-CM | POA: Diagnosis not present

## 2018-06-24 DIAGNOSIS — R059 Cough, unspecified: Secondary | ICD-10-CM

## 2018-06-24 MED ORDER — BENZONATATE 200 MG PO CAPS: 200.0000 mg | ORAL_CAPSULE | Freq: Three times a day (TID) | ORAL | 1 refills | Status: DC | PRN

## 2018-06-24 NOTE — Patient Instructions (Addendum)
ICD-10-CM   1. IPF (idiopathic pulmonary fibrosis) ([place]) J84.112   2. Cough R05   3. Drug-induced weight loss R63.4    T50.905A    IPF (idiopathic pulmonary fibrosis) ([place]) - progressive since spring 2019 - too bad esbriet cause side effects  - start ofev 154m twice daily  - first week just do 1 tablet once daily and then go to 1 tap twice daily  - take with food  - monitor for GI side effect esp given baseline mild intermittent nausea/vomit   Cough - ok to try tessalon perles - 2055mthree times daily as needed - in spring 2020 we can see if you qualify for cough nebulizer study  Drug-induced weight loss - resolved - due to esbriet  Followup  6 weeks with APP to review ofev tolerance and  Check LFT

## 2018-06-24 NOTE — Progress Notes (Signed)
PCP Ashlee Chroman, MD  HPI  IOV 11/03/2017  Chief Complaint  Patient presents with  . Consult    Self referral. Pt states she was diagnosed with pulmonary fibrosis x4 weeks ago. States she has c/o cough with green mucus, mild SOB. Denies any CP.    84 year old female self-referred for evaluation of pulmonary fibrosis.  She is fairly good historian.  She tells me that she has diagnosis of psoriasis of the skin in 2016 and was under the care of Dr. Channing Martin in [place].  She was on Humira for 11 months taking monthly injections but her rash got bad in November 2017 and then she was treated with prednisone.  She went on vacation to [place] and then returned and then had a diagnosis of pneumonia.  She was hospitalized for this at [facility] in January 2018.  She was on oxygen and hospitalized for 3 or 4 days and then discharged.  After that she is always had some amount of residual cough and mild shortness of breath but starting approximately 6 months ago the cough and shortness of breath was more perceptible.  However in the last 2 months a significant acceleration in the severity of cough and shortness of breath.  The cough is described as waking up at night and associated with phlegm.  She also gets short of breath walking or hurrying on level ground or walking up a slight hill.  Her primary care physician did a CT chest with contrast in February 2019.  I do not have the image with me but I reviewed the report and he reports pulmonary fibrosis not otherwise specified.  She did end up in the emergency room on October 31, 2017 and is being discharged with [facility] and redness on taper which is helping.   American College of chest physicians interstitial lung disease questionnaire  Past medical history: Positive for seizures, history of pneumonia in January 2018, acid reflux disease, dry eyes, dry mouth, photophobia and arthralgia.  She has diagnosis of skin psoriasis.  This is not much of an issue at  this point in time  Personal exposure history: She started smoking cigarettes at 84 years of age and smoked 3-4 packs a day and quit in the mid 1980s.  She reports having had use street drugs.  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  She only occasionally visits the coop.  She opens the door and looks that she can out.  Otherwise no exposure to water damage or mold a humidifier [place] or hot tub or Jacuzzi or birds or feathered pillows.  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  Then she retired and moved to [place] in [place] where she lived in normal homes for 5 years up until 8 years ago and then she and her husband moved to [place], [place] where they live in a farm.  Pulmonary toxicity history: Other than exposure to Humira and short-term prednisone no other pulmonary toxicity history including nitrofurantoin or BCG or amiodarone or chemotherapy    Walking desaturation test on 11/03/2017 185 feet x 3 laps on ROOM AIR:  did walk at slow pace and got mildly dyspneic. Did desaturate to 89%. Rest pulse ox was 98%, final pulse ox was 89%. HR response 67/min at rest to 91/min at peak exertion. [name]  Did not Desaturate <  88% . Ashlee Martin did yes  Desaturated </= 3% points. Ashlee Martin yes did get tachyardic    12/08/2017 Acute OV :Sinus congestion .  Patient presents for an acute office visit.  Patient complains of sinus congestion , ear fullness and popping , sinus pressure , drippy nose, watery eyes, very little cough .  Has had this for last few months , Worse for the last 2 weeks .  Seen by PCP last week started on allergy pill and nasal spray , she does not know what they were.  Says she has chronic allergy problems , worse in Spring and Fall.  She denies any discolored mucus, sinus pain, teeth pain, fever, nausea vomiting diarrhea, or chest  pain.    Patient was seen for pulmonary consult March 2019 to establish for pulmonary fibrosis.  She has underlying psoriasis.  Patient was set up for a CT chest that showed fibrotic interstitial lung disease with mild honeycombing.  Autoimmune panel was essentially negative except for SS B IgG antibody.  And sed rate was mildly elevated at 34. Patient has an upcoming PFT and follow-up visit with ILD clinic in 2 weeks. She was having a severe cough last visit.  She was given a prednisone taper.  Patient says she did require one other prednisone taper but her cough is almost totally resolved.  She has a little mild cough in the morning but very mild.   OV 12/29/2017  Chief Complaint  Patient presents with  . Follow-up    Pt states she has had both good days and bad days. States she has had some problems with allergies.    Ashlee Martin presns for followup for ILD workup.it is documented below.  Pulmonary function test in terms of severity shows moderate restriction with significant reduction in diffusion capacity.  Her high-resolution CT scan of the chest is read as probable UIP but I wonder if that is mixed emphysema although she quit smoking many decades ago and indeterminate pattern for UIP.  There might be air trapping but this is not reported.  I again went over her exposures and she confirms and her husband who is here with her today that there is significant chicken coop exposure ongoing currently.  There are no new interim issues other than the fact she had a respiratory exacerbation which required prednisone which seemed to help her symptoms considerably.  Currently she is off prednisone.  Her autoimmune vasculitis and hypersensitivity pneumonitis panel was essentially negative except for trace autoimmune positivity.     Results for Ashlee Martin, Ashlee Martin (MRN [id number]) as of 12/29/2017 13:37  Ref. Range 12/29/2017 12:43  FVC-Pre Latest Units: L 1.49  FVC-%Pred-Pre Latest Units: % 58  FEV1-Pre  Latest Units: L 1.18  FEV1-%Pred-Pre Latest Units: % 62  Pre FEV1/FVC ratio Latest Units: % 79  FEV1FVC-%Pred-Pre Latest Units: % 108  Results for Ashlee Martin, Ashlee Martin (MRN [id number]) as of 12/29/2017 13:37  Ref. Range 12/29/2017 12:43  DLCO unc Latest Units: ml/min/mmHg 7.82  DLCO unc % pred Latest Units: % 30   Results for Ashlee Martin, Ashlee Martin (MRN [id number]) as of 12/29/2017 13:37  Ref. Range 11/03/2017 10:08  ASPERGILLUS FUMIGATUS Latest Ref Range: NEGATIVE  NEGATIVE  Pigeon Serum Latest Ref Range: NEGATIVE  NEGATIVE  Anit Nuclear Antibody(ANA) Latest Ref Range: NEGATIVE  NEGATIVE  Angiotensin-Converting Enzyme Latest Ref Range: 9 - 67 U/L 28  Cyclic Citrullin Peptide Ab Latest Units: UNITS <16  ds DNA Ab Latest Units: IU/mL <1  ENA RNP Ab Latest Ref  Range: 0.0 - 0.9 AI <0.2  Myeloperoxidase Abs Latest Units: AI <1.0  Serine Protease 3 Latest Units: AI <1.0  RA Latex Turbid. Latest Ref Range: <14 IU/mL <14  SSA (Ro) (ENA) Antibody, IgG Latest Ref Range: <1.0 NEG AI <1.0 NEG  SSB (La) (ENA) Antibody, IgG Latest Ref Range: <1.0 NEG AI 1.8 POS (A)  Scleroderma (Scl-70) (ENA) Antibody, IgG Latest Ref Range: <1.0 NEG AI <1.0 NEG    IMPRESSION: 1. Spectrum of findings compatible with fibrotic interstitial lung disease with mild honeycombing. Despite the absence of a clear basilar gradient, these findings are considered to represent probable usual interstitial pneumonia (UIP). Fibrotic phase nonspecific interstitial pneumonia (NSIP) is on the differential. Follow-up high-resolution chest CT in 6-12 months would be useful to assess temporal pattern stability, as clinically warranted. 2. Tiny 3 mm solid left lower lobe pulmonary nodule. No follow-up needed if patient is low-risk. Non-contrast chest CT can be considered in 12 months if patient is high-risk. This recommendation follows the consensus statement: Guidelines for Management of Incidental Pulmonary Nodules Detected on CT Images:From  the Fleischner Society 2017; published online before print (10.1148/radiol.2493241991). 3. Three-vessel coronary atherosclerosis. 4. Dilated main pulmonary artery, suggesting pulmonary arterial hypertension.  Aortic Atherosclerosis (ICD10-I70.0).   Electronically Signed   By: Ilona Sorrel M.D.   On: 11/13/2017 16:46   OV 02/02/2018  Chief Complaint  Patient presents with  . Follow-up    Pt states things have been up and down since last visit and since bronch was performed. Pt still has problems with upset stomach and pt is still having problems with her breathing. Pt does cough in the morning with occ. green mucus. Denies any CP/chest tightness.    Follow-up interstitial lung disease.  This follow-up is after bronchoscopy with lavage which was done Jan 07, 2018.  This shows a slight preponderance of polymorphs and absence of lymphocytes thus suggesting this is not hypersensitivity pneumonitis but with the increase in polymorphs the diagnosis shift towards UIP.  I took a second opinion on radiology from Dr. Lorin Picket and she feels a CT scan is more consistent with UIP.  In the interim patient has no new complaints.  Walking desaturation test indicates a 7% pulse ox drop with tachycardia and shortness of breath but still does not drop below 88%.  Of note she does indicate that she has had chronic intermittent stress related nausea that has been worked up extensively but without any cause.   Results for Ashlee Martin, Ashlee Martin (MRN [id number]) as of 01/07/2018 20:04  Ref. Range 01/07/2018 14:35  Color, Fluid Latest Ref Range: YELLOW  STRAW (A)  WBC, Fluid Latest Ref Range: 0 - 1,000 cu mm 73  Lymphs, Fluid Latest Units: % 6  Eos, Fluid Latest Units: % 4  Appearance, Fluid Latest Ref Range: CLEAR  CLOUDY (A)  Other Cells, Fluid Latest Units: % CORRELATE WITH CY...  Neutrophil Count, Fluid Latest Ref Range: 0 - 25 % 55 (H)  Monocyte-Macrophage-Serous Fluid Latest Ref Range: 50 - 90 % 35 (L)      OV 03/15/2018  Chief Complaint  Patient presents with  . Follow-up    Pt began taking Esbriet but had been having problems keeping food down while taking the med. Med was stopped 7/12 by MW due to the possible reaction and was told to stop med until OV with MR. Other than the beginning problems with Esbriet, pt states she has been doing good since last visit   Ashlee Martin ,  84 y.o. , with dob 02/22/1931 and female ,Not Hispanic or Latino from [address] [place] [address] - presents to ILD  clinic for IPF diagnosis (he is aage > 89 (age 48), probable UIP on CT scan with honeycombing early, negative autoimmune and vasculitis serology and bronchoscopy 01/07/18 with 55% neutrophils on lavage) made 02/02/18 and esbriet decision taken 02/02/18   In terms of her IPF she is doing stable. She does not feel any worse. She gets dyspneic when walking out of her air conditioned room to the hot humid air she does not feel any worse. Walking desaturation test showed desaturation to 87% which seems a little bit worse than before but she denies that she is worse. She does not want to use portable oxygen but she is willing to get herself tested at night. There is no worsening cough or sputum production.  The main issue appears to be that on 03/05/2018 she started her first dose of Pirfenidone (Esbriet). She took one dose and felt fine and then 4 hours later immediately after lunch 12 her food and had nausea and vomiting. She does not believe that the nausea and vomiting were related to the Pirfenidone (Esbriet). She insetead thinks it is a recurrence of her chronic intermittent nausea and vomiting which is had for the last few years. We took more history on this and she tells me that she's had this for the last few years. It is stable. She says primary care physician worked up extensively. However shnever  gastroenterologist or had an endoscopy for this.   OV 04/27/2018  Chief Complaint  Patient  presents with  . Follow-up    Pt had endoscopy performed 8/27. Pt states she has been having problems with pain and states she still has occ SOB but that is better and has an occ cough in the mornings. Denies any CP.     Ashlee Martin , 84 y.o. , with dob August 24, 1931 and female ,Not Hispanic or Latino from [address] [place] [address] - presents to ILD  clinic for IPF diagnosis (he is aage > 13 (age 89), probable UIP on CT scan with honeycombing early, negative autoimmune and vasculitis serology and bronchoscopy 01/07/18 with 55% neutrophils on lavage) made 02/02/18 and esbriet decision taken 02/02/18 , restart date 03/15/18   Ashlee Martin - presents for follow-up of her IPF. Currently she restarted her Pirfenidone (Esbriet) 03/15/2018 and escalated to 2 pills 3 times a day which she has held at that dose. She says that her fatigue is gone from mild pre-Pirfenidone (Esbriet) 2 severe post Pirfenidone (Esbriet). In addition she has weight loss. Her weight loss is 9 poundssince she started the Pirfenidone (Esbriet). She is also having extremely poor appetite. She did seeGI Dr. Barney Drain and had endoscopy 04/20/2018. According to the patientit sounds like she might have had gastritis but I'm not so sure. She's also had ultrasound of the liver and is awaiting the results. She tells me 4 days up with endoscopy she did have one episodes of nausea and vomiting. She feels that the severe D of the nausea and vomiting and the frequency of this is at baseline and she does not think the Pirfenidone (Esbriet) has made this worse. However she is categorical that the fatigue low appetite and weight loss are related to Pirfenidone (Esbriet).She is using oxygen with pulmonary rehabilitation. She is more short of breath. Walking desaturation test today shows that she did desaturate much earlier  than at baseline. Also 03/31/2018 she had overnight oxygen study and the pulse ox was less than 88%  For 7-1/2 hours. She  is refusing to wear portable oxygen. She has agreed to wear oxygen during rehabilitation and at night  OV 05/26/2018  Subjective:  Patient ID: Ashlee Martin, female , DOB: 24-Dec-1930 , age 61 y.o. , MRN: [id number] , ADDRESS: [place] [id number]   05/26/2018 -   Chief Complaint  Patient presents with  . Follow-up    PFT attempted but pt was unable to complete it. Pt has had worsening  and was admitted to the hospital 9/11-9/13 due to a UTI and O2 sats dropping to 70%. Pt states she does have a mild cough in the mornings. Denies any complaints of CP. Pt is now having to wear O2 due to her breathing worsening and is on 3L with exertion and 2L at rest.     HPI Ashlee Martin 84 y.o. -presents IPF-year follow-up.  Last seen April 27, 2018.  Just before that end of August 2019 she underwent endoscopy that showed acid reflux and peptic stricture.  According to the patient she was stretched.  She has stopped as.  After the visit April 27, 2018.  Her vomiting episodes have improved although not completely resolved..  It is unclear if this is because of Pirfenidone (Esbriet) being stopped or because of the endoscopy.  In any event May 05, 2018 she was admitted at [facility].  She brought the outside records with me.  It appears that she had E. coli UTI.  In addition her BNP was high at 1600.  She had a CT scan of the chest that I was able to visualized.  There is evidence of worsening of ILD but this was in the form of a high BNP.  At this point in time she is better but somewhat fatigued.  She has been discharged with continuous oxygen although when we turned her oxygen off which was set at 3 L and turn it off for 20 minutes she had normal pulse ox at rest and desaturated at the first left.  So it appears that her hypoxemia is improved compared to admission but it is still worse compared to a visit 1 month ago.       ROS  OV 06/24/2018  Subjective:  Patient ID:  Ashlee Martin, female , DOB: 10-Apr-1931 , age 13 y.o. , MRN: [id number] , ADDRESS: [place] [id number]   06/24/2018 -   Chief Complaint  Patient presents with  . Follow-up    States her breathing has been good at her basline. Stll concerned about her weight.      HPI Ashlee Martin 84 y.o. -has idiopathic pulmonary fibrosis.  Currently on supportive care of having significant side effects suspected from Pirfenidone (Esbriet).  At this point in time she is been off Pirfenidone (Esbriet) for several weeks.  She is now gained 5 pounds of weight.  Her appetite is returned to normal.  She still has an occasional mild intermittent nausea vomiting which is her baseline idiopathic problem.  She does not have any diarrhea.  She is also started using oxygen.  Without being on Esbriet and also being on oxygen she feels her quality of life is actually improved with less shortness of breath.  She and she is less miserable.  However CT scan and pulmonary function tests show slight decline in lung function with  no fibrosis as documented below.  The CT scan was personally visualized by me.  She continues to have some cough for which she wants [employer]  We discussed nintedanib as an alternative option (see below().  We also discussed research as a care option but her low DLCO makes it difficult for her to qualify for IV or inhaler related studies that do not have GI side effects.   - OFEV  - - time to first exacerbation possibly reduced in one trial but not in another - twice daily, no titration, potentially more convenient dosing  - no need for sunscreen  - high chance of mild diarrhea but low chance of significant diarrhea needing to stop medication.   - Rx diarrhea with lomotil - slight increase in heart attack risk and theoretical increase in bleeding risk,   - need monthly blood work for 3 months and then every 6 months - monitor liver function      Simple office walk 185  feet x  3 laps goal with forehead probe 02/02/2018 Start esbriet 03/15/2018 contnue esbriet 04/27/2018 Stop esbriet 05/26/2018  06/24/2018   O2 used Room air Room air Room air  room air   Number laps completed _0 but desat at 2nd lap  desaturated at first lap   Comments about pace nrmal pacte Normal pace Slow pace  slow pace   Resting Pulse Ox/HR 98% and 83/min 98% and HR 85/min 96% and HR 63/min  98% and 88/min   Final Pulse Ox/HR 91% and 98/min 87% and HR 91% at end of 3rd lao 87% and HR 79 at end of 2nd lap  86% and heart rate of 95 at first lab   Desaturated </= 88% no yes Yes -  yes   Desaturated <= 3% points yes yes yes  yes   Got Tachycardic >/= 90/min yes yes no  yes   Symptoms at end of test dyspnea Mild dyspnea x  dyspnea   Miscellaneous comments Normal pace Normal pace Worse desats  worsening desaturation.  Corrected with 3 laps of walking 2 L    Results for Casas, TAMEIA RAFFERTY (MRN [id number]) as of 06/24/2018 09:25  Ref. Range 12/29/2017 12:43 06/16/2018 10:33  FVC-Pre Latest Units: L 1.49 1.54  FVC-%Pred-Pre Latest Units: % 58 60  Results for Grothaus, ORLENE SALMONS (MRN [id number]) as of 06/24/2018 09:25  Ref. Range 12/29/2017 12:43 06/16/2018 10:33  DLCO unc Latest Units: ml/min/mmHg 7.82 6.02  DLCO unc % pred Latest Units: % 30 23   Results for Ruybal, ILISSA ROSNER (MRN [id number]) as of 06/24/2018 09:25  Ref. Range 04/27/2018 15:06  Hemoglobin Latest Ref Range: 12.0 - 15.0 g/dL 13.6   HRCT 06/15/18 Lungs/Pleura: High-resolution images again demonstrate widespread areas of septal thickening, subpleural reticulation, thickening of the peribronchovascular interstitium, traction bronchiectasis and frank honeycombing. Very mild craniocaudal gradient. Findings appear slightly progressive compared to the prior study from 11/13/2017. No confluent consolidative airspace disease. No pleural effusions. Inspiratory and expiratory imaging demonstrates some mild to moderate air trapping indicative of  small airways disease. No definite suspicious appearing pulmonary nodules or masses are noted. IMPRESSION: 1. Previously noted left Netzel lobe nodule has completely resolved, indicative of an infectious or inflammatory etiology on the prior study. 2. Chronic changes of interstitial lung disease again considered probable usual interstitial pneumonia (UIP) per ATS guidelines. 3. Aortic atherosclerosis, in addition to left main and 3 vessel coronary artery disease. Please note that although the presence of coronary artery calcium  documents the presence of coronary artery disease, the severity of this disease and any potential stenosis cannot be assessed on this non-gated CT examination. Assessment for potential risk factor modification, dietary therapy or pharmacologic therapy may be warranted, if clinically indicated.  Aortic Atherosclerosis (ICD10-I70.0).   Electronically Signed   By: Vinnie Langton M.D.   On: 06/15/2018 17:00  ROS - per HPI     has a past medical history of Arthritis, Depression, GERD (gastroesophageal reflux disease), Hypercholesterolemia, Hypertension, ILD (interstitial lung disease) ([place]), Psoriasis, Seizure ([place]), and Seizures ([place]).   reports that she quit smoking about 45 years ago. Her smoking use included cigarettes. She has a 60.00 pack-year smoking history. She has never used smokeless tobacco.  Past Surgical History:  Procedure Laterality Date  . BIOPSY  04/20/2018   Procedure: BIOPSY;  Surgeon: Danie Binder, MD;  Location: [facility];  Service: Endoscopy;;  gastric  . DILATION AND CURETTAGE OF UTERUS     x3  . ESOPHAGOGASTRODUODENOSCOPY (EGD) WITH PROPOFOL N/A 04/20/2018   Procedure: ESOPHAGOGASTRODUODENOSCOPY (EGD) WITH PROPOFOL;  Surgeon: Danie Binder, MD;  Location: [facility];  Service: Endoscopy;  Laterality: N/A;  9:15am  . SAVORY DILATION N/A 04/20/2018   Procedure: SAVORY DILATION;  Surgeon: Danie Binder, MD;  Location: [facility];  Service: Endoscopy;  Laterality: N/A;  . VIDEO BRONCHOSCOPY Bilateral 01/07/2018   Procedure: VIDEO BRONCHOSCOPY WITHOUT FLUORO;  Surgeon: Brand Males, MD;  Location: [facility];  Service: Cardiopulmonary;  Laterality: Bilateral;  . WRIST SURGERY Right    ORIF    Allergies  Allergen Reactions  . Humira [Adalimumab] Other (See Comments)    Worsened your psoriasis.  . Pirfenidone Other (See Comments)    Fatigue and weight loss    Immunization History  Administered Date(s) Administered  . Influenza, High Dose Seasonal PF 06/10/2017, 04/27/2018  . Pneumococcal Conjugate-13 12/09/2014    Family History  Problem Relation Age of Onset  . Heart disease Mother   . Heart disease Father   . Cancer Sister        GYN  . Colon cancer Neg Hx      Current Outpatient Medications:  .  atenolol (TENORMIN) 25 MG tablet, Take 25 mg by mouth 2 (two) times daily. , Disp: , Rfl:  .  dextromethorphan-guaiFENesin (MUCINEX DM) 30-600 MG 12hr tablet, Take 1 tablet by mouth See admin instructions. Take 1 tablet daily in the morning, may repeat dose in the evening if needed for congestion., Disp: , Rfl:  .  doxylamine, Sleep, (UNISOM) 25 MG tablet, Take 25 mg by mouth at bedtime. , Disp: , Rfl:  .  Histamine Dihydrochloride (AUSTRALIAN DREAM ARTHRITIS EX), Apply 1 application topically 3 (three) times daily as needed (for pain.)., Disp: , Rfl:  .  pantoprazole (PROTONIX) 40 MG tablet, Take 1 tablet (40 mg total) by mouth daily. 30 minutes before breakfast, Disp: 90 tablet, Rfl: 3 .  Polyethyl Glycol-Propyl Glycol (SYSTANE OP), Place 1 drop into both eyes 3 (three) times daily as needed (dry/irritated eyes.). , Disp: , Rfl:  .  rOPINIRole (REQUIP) 2 MG tablet, Take 2 mg by mouth at bedtime. , Disp: , Rfl:  .  simvastatin (ZOCOR) 20 MG tablet, Take 20 mg by mouth at bedtime. , Disp: , Rfl:  .  VENTOLIN HFA 108 (90 Base) MCG/ACT inhaler, Inhale 1-2 puffs into the lungs every 6 (six) hours  as needed (chest congestion/cough). , Disp: , Rfl: 0 .  vitamin B-12 (  CYANOCOBALAMIN) 1000 MCG tablet, Take 1,000 mcg by mouth at bedtime. , Disp: , Rfl:  .  benzonatate (TESSALON) 200 MG capsule, Take 1 capsule (200 mg total) by mouth 3 (three) times daily as needed for cough., Disp: 30 capsule, Rfl: 1      Objective:   Vitals:   06/24/18 0929  BP: 120/78  Pulse: 74  SpO2: 93%  Weight: 149 lb 6.4 oz (67.8 kg)  Height: _0  (1.651 m)    Estimated body mass index is 24.86 kg/m as calculated from the following:   Height as of this encounter: _1  (1.651 m).   Weight as of this encounter: 149 lb 6.4 oz (67.8 kg).  _2 @  [employer]   06/24/18 0929  Weight: 149 lb 6.4 oz (67.8 kg)     Physical Exam  General Appearance:    Alert, cooperative, no distress, appears stated age - yes , Deconditioned looking - mild , OBESE  - no, Sitting on Wheelchair -  no  Head:    Normocephalic, without obvious abnormality, atraumatic  Eyes:    PERRL, conjunctiva/corneas clear,  Ears:    Normal TM's and external ear canals, both ears  Nose:   Nares normal, septum midline, mucosa normal, no drainage    or sinus tenderness. OXYGEN ON  - YES . Patient is @ 2L   Throat:   Lips, mucosa, and tongue normal; teeth and gums normal. Cyanosis on lips - no  Neck:   Supple, symmetrical, trachea midline, no adenopathy;    thyroid:  no enlargement/tenderness/nodules; no carotid   bruit or JVD  Back:     Symmetric, no curvature, ROM normal, no CVA tenderness  Lungs:     Distress - no , Wheeze no, Barrell Chest - no, Purse lip breathing - no, Crackles - yes at base   Chest Wall:    No tenderness or deformity.    Heart:    Regular rate and rhythm, S1 and S2 normal, no rub   or gallop, Murmur - no  Breast Exam:    NOT DONE  Abdomen:     Soft, non-tender, bowel sounds active all four quadrants,    no masses, no organomegaly. Visceral obesity - no  Genitalia:   NOT DONE  Rectal:   NOT DONE    Extremities:   Extremities - normal, Has Cane - no, Clubbing - no, Edema - no  Pulses:   2+ and symmetric all extremities  Skin:   Stigmata of Connective Tissue Disease - no  Lymph nodes:   Cervical, supraclavicular, and axillary nodes normal  Psychiatric:  Neurologic:   Pleasant - yes, Anxious - no, Flat affect - no  CAm-ICU - neg, Alert and Oriented x 3 - yes, Moves all 4s - yes, Speech - normal, Cognition - intact           Assessment:       ICD-10-CM   1. IPF (idiopathic pulmonary fibrosis) ([place]) J84.112   2. Cough R05   3. Drug-induced weight loss R63.4    T50.905A        Plan:     Patient Instructions     ICD-10-CM   1. IPF (idiopathic pulmonary fibrosis) ([place]) J84.112   2. Cough R05   3. Drug-induced weight loss R63.4    T50.905A    IPF (idiopathic pulmonary fibrosis) ([place]) - progressive since spring 2019 - too bad esbriet cause side effects  - start ofev 134m twice daily  - first  week just do 1 tablet once daily and then go to 1 tap twice daily  - take with food  - monitor for GI side effect esp given baseline mild intermittent nausea/vomit   Cough - ok to try tessalon perles - 233m three times daily as needed - in spring 2020 we can see if you qualify for cough nebulizer study  Drug-induced weight loss - resolved - due to esbriet  Followup  6 weeks with APP to review ofev tolerance and  Check LFT   > 50% of this > 25 min visit spent in face to face counseling or coordination of care - by this undersigned MD - Dr MBrand Males This includes one or more of the following documented above: discussion of test results, diagnostic or treatment recommendations, prognosis, risks and benefits of management options, instructions, education, compliance or risk-factor reduction     SIGNATURE    Dr. MBrand Males M.D., F.C.C.P,  Pulmonary and Critical Care Medicine Staff Physician, CMarinelandDirector - Interstitial Lung Disease   Program  [facility] [place] [place] [address] Pager: [phone] If no answer or between  15:00h - 7:00h: call [phone] Telephone: [phone]  9:47 AM 06/24/2018

## 2018-06-28 ENCOUNTER — Telehealth: Payer: Self-pay | Admitting: Internal Medicine

## 2018-06-28 NOTE — Telephone Encounter (Signed)
Medication name and strength: Ofev 131m Provider: Dr. RChase Caller [facility] [id number]: [phone]: [phone] Was the PA started on CMM?  Yes If yes, please enter the Key: [id number] Timeframe for approval/denial: 24-72 hours   [facility].

## 2018-06-29 ENCOUNTER — Encounter (HOSPITAL_COMMUNITY)
Admission: RE | Admit: 2018-06-29 | Discharge: 2018-06-29 | Disposition: A | Discharge status: Home / Self Care | Payer: Medicare HMO | Source: Ambulatory Visit | Attending: Internal Medicine | Admitting: Internal Medicine

## 2018-06-29 DIAGNOSIS — J849 Interstitial pulmonary disease, unspecified: Secondary | ICD-10-CM | POA: Diagnosis not present

## 2018-06-29 DIAGNOSIS — Z87891 Personal history of nicotine dependence: Secondary | ICD-10-CM | POA: Diagnosis not present

## 2018-06-29 DIAGNOSIS — Z7982 Long term (current) use of aspirin: Secondary | ICD-10-CM | POA: Insufficient documentation

## 2018-06-29 DIAGNOSIS — Z79899 Other long term (current) drug therapy: Secondary | ICD-10-CM | POA: Insufficient documentation

## 2018-06-29 NOTE — Telephone Encounter (Signed)
Patient is returning call. CB is [phone]

## 2018-06-29 NOTE — Telephone Encounter (Signed)
Per CMM, medication has been approved.   LM for patient so she is aware.

## 2018-06-29 NOTE — Progress Notes (Signed)
Daily Session Note  Patient Details  Name: Ashlee Martin MRN: [id number] Date of Birth: March 25, 1931 Referring Provider:     PULMONARY REHAB OTHER RESP ORIENTATION from 04/08/2018 in [place]  Referring Provider  Ramaswamy      Encounter Date: 06/29/2018  Check In: Session Check In - 06/29/18 1330      Check-In   Supervising physician immediately available to respond to emergencies  See telemetry face sheet for immediately available MD    Location  [facility]    Staff Present  Russella Dar, MS, EP, [facility], Exercise Physiologist;Yesly Gerety Zachery Conch, Exercise Physiologist    Medication changes reported      No    Fall or balance concerns reported     No    Tobacco Cessation  No Change    Warm-up and Cool-down  Performed as group-led instruction    Resistance Training Performed  Yes    VAD Patient?  No    PAD/SET Patient?  No      Pain Assessment   Currently in Pain?  No/denies    Pain Score  0-No pain    Multiple Pain Sites  No       Capillary Blood Glucose: No results found for this or any previous visit (from the past 24 hour(s)).    Social History   Tobacco Use  Smoking Status Former Smoker  . Packs/day: 3.00  . Years: 20.00  . Pack years: 60.00  . Types: Cigarettes  . Last attempt to quit: 11/03/1972  . Years since quitting: 45.6  Smokeless Tobacco Never Used    Goals Met:  Proper associated with RPD/PD & O2 Sat Independence with exercise equipment Using PLB without cueing & demonstrates good technique Exercise tolerated well No report of cardiac concerns or symptoms Strength training completed today  Goals Unmet:  Not Applicable  Comments: Pt able to follow exercise prescription today without complaint.  Will continue to monitor for progression. Check out 2:30.   Dr. Sinda Du is Medical Director for [facility].

## 2018-06-29 NOTE — Telephone Encounter (Signed)
Called and spoke with patient. Let her know that the prior authorization has been submitted for the Ofev. Patient stated that she had received information from her insurance stating a prior Josem Kaufmann was needed. Patient verbalized understanding. We will let patient know when medication has been approved.

## 2018-07-01 ENCOUNTER — Encounter (HOSPITAL_COMMUNITY)
Admission: RE | Admit: 2018-07-01 | Discharge: 2018-07-01 | Disposition: A | Discharge status: Home / Self Care | Payer: Medicare HMO | Source: Ambulatory Visit | Attending: Internal Medicine | Admitting: Internal Medicine

## 2018-07-01 DIAGNOSIS — Z87891 Personal history of nicotine dependence: Secondary | ICD-10-CM | POA: Diagnosis not present

## 2018-07-01 DIAGNOSIS — Z7982 Long term (current) use of aspirin: Secondary | ICD-10-CM | POA: Diagnosis not present

## 2018-07-01 DIAGNOSIS — J849 Interstitial pulmonary disease, unspecified: Secondary | ICD-10-CM

## 2018-07-01 DIAGNOSIS — Z79899 Other long term (current) drug therapy: Secondary | ICD-10-CM | POA: Diagnosis not present

## 2018-07-01 NOTE — Progress Notes (Signed)
Daily Session Note  Patient Details  Name: Ashlee Martin MRN: [id number] Date of Birth: 1931/06/12 Referring Provider:     PULMONARY REHAB OTHER RESP ORIENTATION from 04/08/2018 in [place]  Referring Provider  Ashlee Martin      Encounter Date: 07/01/2018  Check In: Session Check In - 07/01/18 1330      Check-In   Supervising physician immediately available to respond to emergencies  See telemetry face sheet for immediately available MD    Location  [facility]    Staff Present  [name], Exercise Physiologist;[name], RN, BSN    Medication changes reported      No    Fall or balance concerns reported     No    Warm-up and Cool-down  Performed as group-led instruction    Resistance Training Performed  Yes    VAD Patient?  No    PAD/SET Patient?  No      Pain Assessment   Currently in Pain?  No/denies    Pain Score  0-No pain    Multiple Pain Sites  No       Capillary Blood Glucose: No results found for this or any previous visit (from the past 24 hour(s)).    Social History   Tobacco Use  Smoking Status Former Smoker  . Packs/day: 3.00  . Years: 20.00  . Pack years: 60.00  . Types: Cigarettes  . Last attempt to quit: 11/03/1972  . Years since quitting: 45.6  Smokeless Tobacco Never Used    Goals Met:  Proper associated with RPD/PD & O2 Sat Independence with exercise equipment Improved SOB with ADL's Using PLB without cueing & demonstrates good technique Exercise tolerated well No report of cardiac concerns or symptoms Strength training completed today  Goals Unmet:  Not Applicable  Comments: Pt able to follow exercise prescription today without complaint.  Will continue to monitor for progression. Check out 1430.   Dr. Sinda Du is Medical Director for [facility] Pul[facility].

## 2018-07-01 NOTE — Progress Notes (Signed)
Pulmonary Individual Treatment Plan  Patient Details  Name: Ashlee Martin MRN: [id number] Date of Birth: 29-Mar-1931 Referring Provider:     PULMONARY REHAB OTHER RESP ORIENTATION from 04/08/2018 in [facility]  Referring Provider  [facility]      Initial Encounter Date:    PULMONARY REHAB OTHER RESP ORIENTATION from 04/08/2018 in [facility]  Date  04/08/18      Visit Diagnosis: ILD (interstitial lung disease) ([place])  Patient's Home Medications on Admission:   Current Outpatient Medications:  .  atenolol (TENORMIN) 25 MG tablet, Take 25 mg by mouth 2 (two) times daily. , Disp: , Rfl:  .  benzonatate (TESSALON) 200 MG capsule, Take 1 capsule (200 mg total) by mouth 3 (three) times daily as needed for cough., Disp: 30 capsule, Rfl: 1 .  dextromethorphan-guaiFENesin (MUCINEX DM) 30-600 MG 12hr tablet, Take 1 tablet by mouth See admin instructions. Take 1 tablet daily in the morning, may repeat dose in the evening if needed for congestion., Disp: , Rfl:  .  doxylamine, Sleep, (UNISOM) 25 MG tablet, Take 25 mg by mouth at bedtime. , Disp: , Rfl:  .  Histamine Dihydrochloride (AUSTRALIAN DREAM ARTHRITIS EX), Apply 1 application topically 3 (three) times daily as needed (for pain.)., Disp: , Rfl:  .  pantoprazole (PROTONIX) 40 MG tablet, Take 1 tablet (40 mg total) by mouth daily. 30 minutes before breakfast, Disp: 90 tablet, Rfl: 3 .  Polyethyl Glycol-Propyl Glycol (SYSTANE OP), Place 1 drop into both eyes 3 (three) times daily as needed (dry/irritated eyes.). , Disp: , Rfl:  .  rOPINIRole (REQUIP) 2 MG tablet, Take 2 mg by mouth at bedtime. , Disp: , Rfl:  .  simvastatin (ZOCOR) 20 MG tablet, Take 20 mg by mouth at bedtime. , Disp: , Rfl:  .  VENTOLIN HFA 108 (90 Base) MCG/ACT inhaler, Inhale 1-2 puffs into the lungs every 6 (six) hours as needed (chest congestion/cough). , Disp: , Rfl: 0 .  vitamin B-12 (CYANOCOBALAMIN) 1000 MCG tablet, Take 1,000  mcg by mouth at bedtime. , Disp: , Rfl:   Past Medical History: Past Medical History:  Diagnosis Date  . Arthritis   . Depression   . GERD (gastroesophageal reflux disease)   . Hypercholesterolemia   . Hypertension   . ILD (interstitial lung disease) ([place])   . Psoriasis    had been on Humira but continued to have UTIs, sinus infections, etc. Taken off  . Seizure ([place])    had 3-4 seizures, 8 years ago, placed on medication and has had no seizures since.  . Seizures ([place])     Tobacco Use: Social History   Tobacco Use  Smoking Status Former Smoker  . Packs/day: 3.00  . Years: 20.00  . Pack years: 60.00  . Types: Cigarettes  . Last attempt to quit: 11/03/1972  . Years since quitting: 45.6  Smokeless Tobacco Never Used    Labs: Recent Review Flowsheet Data    There is no flowsheet data to display.      Capillary Blood Glucose: No results found for: GLUCAP   Pulmonary Assessment Scores: Pulmonary Assessment Scores    Row Name 04/08/18 1617         ADL UCSD   ADL Phase  Entry     SOB Score total  41     Rest  0     Walk  8     Stairs  1     Bath  1  Dress  1     Shop  3       CAT Score   CAT Score  16       mMRC Score   mMRC Score  3        Pulmonary Function Assessment: Pulmonary Function Assessment - 04/08/18 1616      Pulmonary Function Tests   FVC%  58 %    FEV1%  62 %    FEV1/FVC Ratio  108    RV%  35 %    DLCO%  30 %      Initial Spirometry Results   FVC%  58 %    FEV1%  62 %    FEV1/FVC Ratio  108      Breath   Bilateral Breath Sounds  Clear    Shortness of Breath  Yes   Mostly during walk test.       Exercise Target Goals: Exercise Program Goal: Individual exercise prescription set using results from initial 6 min walk test and THRR while considering  patient's activity barriers and safety.   Exercise Prescription Goal: Initial exercise prescription builds to 30-45 minutes a day of aerobic activity, 2-3 days per week.   Home exercise guidelines will be given to patient during program as part of exercise prescription that the participant will acknowledge.  Activity Barriers & Risk Stratification:   6 Minute Walk: 6 Minute Walk    Row Name 04/08/18 1537         6 Minute Walk   Phase  Initial     Distance  800 feet     Walk Time  6 minutes     # of Rest Breaks  1     MPH  1.51     METS  2.16     RPE  13     Perceived Dyspnea   13     Resting HR  68 bpm     Resting BP  110/62     Resting Oxygen Saturation   91 %     Exercise Oxygen Saturation  during 6 min walk  88 %     Max Ex. HR  93 bpm     Max Ex. BP  130/64     2 Minute Post BP  124/62        Oxygen Initial Assessment: Oxygen Initial Assessment - 04/08/18 1615      Home Oxygen   Home Oxygen Device  None    Sleep Oxygen Prescription  None    Home Exercise Oxygen Prescription  None    Home at Rest Exercise Oxygen Prescription  None    Compliance with Home Oxygen Use  --   N/A     Initial 6 min Walk   Oxygen Used  None      Program Oxygen Prescription   Program Oxygen Prescription  None       Oxygen Re-Evaluation: Oxygen Re-Evaluation    Row Name 05/13/18 1549 06/03/18 1503 07/01/18 1447         Program Oxygen Prescription   Program Oxygen Prescription  Continuous  Continuous  Continuous     Liters per minute  4  4 Exercise O2 at 3 to 4 L/M.  4     FiO2%  92  90  93       Home Oxygen   Home Oxygen Device  Portable Concentrator  Portable Concentrator  Portable Concentrator     Sleep Oxygen Prescription  Continuous  Continuous  Continuous     Liters per minute  _0 Home Exercise Oxygen Prescription  Continuous  Continuous  Continuous     Liters per minute  _1 Home at Rest Exercise Oxygen Prescription  Continuous  Continuous  Continuous     Liters per minute  _2 Compliance with Home Oxygen Use  Yes  Yes  Yes       Goals/Expected Outcomes   Short Term Goals  To learn and understand importance  of monitoring SPO2 with pulse oximeter and demonstrate accurate use of the pulse oximeter.;To learn and exhibit compliance with exercise, home and travel O2 prescription;To learn and understand importance of maintaining oxygen saturations>88%;To learn and demonstrate proper pursed lip breathing techniques or other breathing techniques.  To learn and understand importance of monitoring SPO2 with pulse oximeter and demonstrate accurate use of the pulse oximeter.;To learn and exhibit compliance with exercise, home and travel O2 prescription;To learn and understand importance of maintaining oxygen saturations>88%;To learn and demonstrate proper pursed lip breathing techniques or other breathing techniques.  To learn and understand importance of monitoring SPO2 with pulse oximeter and demonstrate accurate use of the pulse oximeter.;To learn and exhibit compliance with exercise, home and travel O2 prescription;To learn and understand importance of maintaining oxygen saturations>88%;To learn and demonstrate proper pursed lip breathing techniques or other breathing techniques.     Long  Term Goals  Exhibits compliance with exercise, home and travel O2 prescription;Verbalizes importance of monitoring SPO2 with pulse oximeter and return demonstration;Maintenance of O2 saturations>88%;Exhibits proper breathing techniques, such as pursed lip breathing or other method taught during program session  Exhibits compliance with exercise, home and travel O2 prescription;Verbalizes importance of monitoring SPO2 with pulse oximeter and return demonstration;Maintenance of O2 saturations>88%;Exhibits proper breathing techniques, such as pursed lip breathing or other method taught during program session  Exhibits compliance with exercise, home and travel O2 prescription;Verbalizes importance of monitoring SPO2 with pulse oximeter and return demonstration;Maintenance of O2 saturations>88%;Exhibits proper breathing techniques, such as  pursed lip breathing or other method taught during program session     Comments  Patient was recently prescribed O2 at home. Not sure if she is supposed to use continuously or as needed. Will clarify this at her next visit. Patient demonstrates proper pursed lip breathing technique and proper usage of Pulse Oximetry and verbalizes understanding of maintaining her O2 Sat >88%. Will conitnue to monitor.   Patient was recently prescribed O2 at home. Not sure if she is supposed to use continuously or as needed. Will clarify this at her next visit. Patient demonstrates proper pursed lip breathing technique and proper usage of Pulse Oximetry and verbalizes understanding of maintaining her O2 Sat >88%. Will conitnue to monitor.   Patient was recently prescribed O2 at home. Not sure if she is supposed to use continuously or as needed. Will clarify this at her next visit. Patient demonstrates proper pursed lip breathing technique and proper usage of Pulse Oximetry and verbalizes understanding of maintaining her O2 Sat >88%. Will conitnue to monitor.      Goals/Expected Outcomes  Patient will continue to meet her short and long term goals.   Patient will continue to meet her short and long term goals.   Patient will continue to meet her short and long term goals.         Oxygen Discharge (Final Oxygen  Re-Evaluation): Oxygen Re-Evaluation - 07/01/18 1447      Program Oxygen Prescription   Program Oxygen Prescription  Continuous    Liters per minute  4    FiO2%  93      Home Oxygen   Home Oxygen Device  Portable Concentrator    Sleep Oxygen Prescription  Continuous    Liters per minute  2    Home Exercise Oxygen Prescription  Continuous    Liters per minute  4    Home at Rest Exercise Oxygen Prescription  Continuous    Liters per minute  2    Compliance with Home Oxygen Use  Yes      Goals/Expected Outcomes   Short Term Goals  To learn and understand importance of monitoring SPO2 with pulse oximeter and  demonstrate accurate use of the pulse oximeter.;To learn and exhibit compliance with exercise, home and travel O2 prescription;To learn and understand importance of maintaining oxygen saturations>88%;To learn and demonstrate proper pursed lip breathing techniques or other breathing techniques.    Long  Term Goals  Exhibits compliance with exercise, home and travel O2 prescription;Verbalizes importance of monitoring SPO2 with pulse oximeter and return demonstration;Maintenance of O2 saturations>88%;Exhibits proper breathing techniques, such as pursed lip breathing or other method taught during program session    Comments  Patient was recently prescribed O2 at home. Not sure if she is supposed to use continuously or as needed. Will clarify this at her next visit. Patient demonstrates proper pursed lip breathing technique and proper usage of Pulse Oximetry and verbalizes understanding of maintaining her O2 Sat >88%. Will conitnue to monitor.     Goals/Expected Outcomes  Patient will continue to meet her short and long term goals.        Initial Exercise Prescription: Initial Exercise Prescription - 04/08/18 1500      Date of Initial Exercise RX and Referring Provider   Date  04/08/18    Referring Provider  Ramaswamy    Expected Discharge Date  07/24/18      Treadmill   MPH  0.8    Grade  0    Minutes  17    METs  1.6      NuStep   Level  1    SPM  42    Minutes  22    METs  1.7      Prescription Details   Frequency (times per week)  2    Duration  Progress to 30 minutes of continuous aerobic without signs/symptoms of physical distress      Intensity   THRR 40-80% of Max Heartrate  102-113-125    Ratings of Perceived Exertion  11-13    Perceived Dyspnea  0-4      Progression   Progression  Continue progressive overload as per policy without signs/symptoms or physical distress.      Resistance Training   Training Prescription  Yes    Weight  1    Reps  10-15       Perform  Capillary Blood Glucose checks as needed.  Exercise Prescription Changes:  Exercise Prescription Changes    Row Name 04/22/18 0700 04/30/18 1400 05/27/18 0800 06/15/18 1400       Response to Exercise   Blood Pressure (Admit)  132/60  102/60  102/62  144/66    Blood Pressure (Exercise)  152/68  132/68  126/64  144/66    Blood Pressure (Exit)  136/60  110/66  106/64  118/64  Heart Rate (Admit)  68 bpm  77 bpm  96 bpm  71 bpm    Heart Rate (Exercise)  96 bpm  79 bpm  113 bpm  84 bpm    Heart Rate (Exit)  76 bpm  79 bpm  103 bpm  77 bpm    Oxygen Saturation (Admit)  90 %  93 %  97 %  94 %    Oxygen Saturation (Exercise)  97 %  90 %  93 %  94 %    Oxygen Saturation (Exit)  93 %  93 %  97 %  90 %    Rating of Perceived Exertion (Exercise)  _0 Perceived Dyspnea (Exercise)  _1 Symptoms  - SOB  -  -  -    Duration  Progress to 30 minutes of  aerobic without signs/symptoms of physical distress  Progress to 30 minutes of  aerobic without signs/symptoms of physical distress  Progress to 30 minutes of  aerobic without signs/symptoms of physical distress  Progress to 30 minutes of  aerobic without signs/symptoms of physical distress    Intensity  THRR New 95-109-122  THRR unchanged  THRR unchanged  THRR unchanged      Progression   Progression  Continue to progress workloads to maintain intensity without signs/symptoms of physical distress.  Continue to progress workloads to maintain intensity without signs/symptoms of physical distress.  Continue to progress workloads to maintain intensity without signs/symptoms of physical distress.  Continue to progress workloads to maintain intensity without signs/symptoms of physical distress.    Average METs  1.98  1.5  1.78  1.9      Resistance Training   Training Prescription  No  Yes  Yes  Yes    Weight  _2 Reps  10-15  10-15  10-15  10-15    Time  5 Minutes  5 Minutes  5 Minutes  5 Minutes      Oxygen   Oxygen   -  -  Continuous  Continuous    Liters  -  -  2  2      Treadmill   MPH  0.8  0.8  1  1.2    Grade  0  0  0  0    Minutes  _3 METs  1.6  1.61  1.76  1.9      NuStep   Level  _4 SPM  62  84  85  85    Minutes  _5 METs  1.6  1.5  1.8  1.9      Home Exercise Plan   Plans to continue exercise at  -  -  -  Home (comment) walking    Frequency  -  -  -  Add 3 additional days to program exercise sessions.    Initial Home Exercises Provided  -  -  -  04/29/18       Exercise Comments:  Exercise Comments    Row Name 04/22/18 0802 04/30/18 1454 05/14/18 0749 06/01/18 1322 06/30/18 1515   Exercise Comments  Patient is not feeling her best due to her stomach issues. She wants to come and wants to get stronger.  Patient still experiences weakness due to stomach pain but has continued to attend rehab to reach her goals.   Patient is doing better. She needs to be more consistent.   Patient has been attending regularly and seems to be getting stronger. Supplemental oxygen has helped her a lot.   Patient is back after taking last week off for doctors appointments. She is feeling stronger and says she's able to cook more for her husband.       Exercise Goals and Review:  Exercise Goals    Row Name 04/08/18 1553             Exercise Goals   Increase Physical Activity  Yes       Intervention  Provide advice, education, support and counseling about physical activity/exercise needs.;Develop an individualized exercise prescription for aerobic and resistive training based on initial evaluation findings, risk stratification, comorbidities and participant's personal goals.       Expected Outcomes  Short Term: Attend rehab on a regular basis to increase amount of physical activity.;Long Term: Add in home exercise to make exercise part of routine and to increase amount of physical activity.       Increase Strength and Stamina  Yes       Intervention  Provide  advice, education, support and counseling about physical activity/exercise needs.;Develop an individualized exercise prescription for aerobic and resistive training based on initial evaluation findings, risk stratification, comorbidities and participant's personal goals.       Expected Outcomes  Short Term: Increase workloads from initial exercise prescription for resistance, speed, and METs.;Long Term: Improve cardiorespiratory fitness, muscular endurance and strength as measured by increased METs and functional capacity (6MWT)       Able to understand and use rate of perceived exertion (RPE) scale  Yes       Intervention  Provide education and explanation on how to use RPE scale       Expected Outcomes  Short Term: Able to use RPE daily in rehab to express subjective intensity level;Long Term:  Able to use RPE to guide intensity level when exercising independently       Able to understand and use Dyspnea scale  Yes       Intervention  Provide education and explanation on how to use Dyspnea scale       Expected Outcomes  Short Term: Able to use Dyspnea scale daily in rehab to express subjective sense of shortness of breath during exertion;Long Term: Able to use Dyspnea scale to guide intensity level when exercising independently       Knowledge and understanding of Target Heart Rate Range (THRR)  Yes       Intervention  Provide education and explanation of THRR including how the numbers were predicted and where they are located for reference       Expected Outcomes  Short Term: Able to use daily as guideline for intensity in rehab;Long Term: Able to use THRR to govern intensity when exercising independently       Able to check pulse independently  Yes       Intervention  Provide education and demonstration on how to check pulse in carotid and radial arteries.;Review the importance of being able to check your own pulse for safety during independent exercise       Expected Outcomes  Short Term: Able to  explain why pulse checking is important during independent exercise;Long Term: Able to check pulse independently and accurately  Understanding of Exercise Prescription  Yes       Intervention  Provide education, explanation, and written materials on patient's individual exercise prescription       Expected Outcomes  Short Term: Able to explain program exercise prescription;Long Term: Able to explain home exercise prescription to exercise independently          Exercise Goals Re-Evaluation : Exercise Goals Re-Evaluation    Row Name 04/22/18 0752 04/30/18 1452 05/14/18 0745 06/01/18 1320 06/30/18 1513     Exercise Goal Re-Evaluation   Exercise Goals Review  Increase Physical Activity;Increase Strength and Stamina;Able to understand and use Dyspnea scale;Understanding of Exercise Prescription;Knowledge and understanding of Target Heart Rate Range (THRR)  Increase Physical Activity;Increase Strength and Stamina;Able to understand and use Dyspnea scale;Understanding of Exercise Prescription;Knowledge and understanding of Target Heart Rate Range (THRR)  Increase Physical Activity;Increase Strength and Stamina;Able to understand and use Dyspnea scale;Understanding of Exercise Prescription;Knowledge and understanding of Target Heart Rate Range (THRR)  Increase Physical Activity;Increase Strength and Stamina;Able to understand and use Dyspnea scale;Understanding of Exercise Prescription;Knowledge and understanding of Target Heart Rate Range (THRR)  Increase Physical Activity;Increase Strength and Stamina;Able to understand and use Dyspnea scale;Understanding of Exercise Prescription;Knowledge and understanding of Target Heart Rate Range (THRR)   Comments  Patient is still new to the program. She is having some stomach issues that makes it difficult for her to eat. When she comes she is sometimes weak because of not eating. She is seeing her doctor to determine what the problem is. She has not been  progressed yet due to having doctors appointments about her stomach. She does like coming to the program and hopes it will help her reach her goals.   Patient has had 4 visits so far. She is still weak due to her stomach pains but will hopefully have those issues resolved soon.   Patient has had 6 visits so far. She is still weak due to her last episode of having urinary track infection. She is on an antibiotics and they have added supplemental  oxygen. Will clarify the liter flow on her next visit.   Patient has been doing well in the program since being place on oxygen. She is working out on [employer]. She seems to be getting around better with it although she doesn't want it. Will continue to monitor and progress.   Patient continues to do well in the program. She has adjusted to having her oxygen full time and now sees the benefit. She is handling increases on boththe treadmill and nustep well.    Expected Outcomes  Get energy back, iIncrease appetite and continue to get stronger.   Get energy back, iIncrease appetite and continue to get stronger.   Get energy back, iIncrease appetite and continue to get stronger.   Increase energy levels, appetite and get stronger.   Increase energy levels, appetite and get stronger.       Discharge Exercise Prescription (Final Exercise Prescription Changes): Exercise Prescription Changes - 06/15/18 1400      Response to Exercise   Blood Pressure (Admit)  144/66    Blood Pressure (Exercise)  144/66    Blood Pressure (Exit)  118/64    Heart Rate (Admit)  71 bpm    Heart Rate (Exercise)  84 bpm    Heart Rate (Exit)  77 bpm    Oxygen Saturation (Admit)  94 %    Oxygen Saturation (Exercise)  94 %    Oxygen Saturation (Exit)  90 %  Rating of Perceived Exertion (Exercise)  11    Perceived Dyspnea (Exercise)  11    Duration  Progress to 30 minutes of  aerobic without signs/symptoms of physical distress    Intensity  THRR unchanged      Progression   Progression   Continue to progress workloads to maintain intensity without signs/symptoms of physical distress.    Average METs  1.9      Resistance Training   Training Prescription  Yes    Weight  3    Reps  10-15    Time  5 Minutes      Oxygen   Oxygen  Continuous    Liters  2      Treadmill   MPH  1.2    Grade  0    Minutes  17    METs  1.9      NuStep   Level  2    SPM  85    Minutes  22    METs  1.9      Home Exercise Plan   Plans to continue exercise at  Home (comment)   walking   Frequency  Add 3 additional days to program exercise sessions.    Initial Home Exercises Provided  04/29/18       Nutrition:  Target Goals: Understanding of nutrition guidelines, daily intake of sodium <1515m, cholesterol <2059m calories 30% from fat and 7% or less from saturated fats, daily to have 5 or more servings of fruits and vegetables.  Biometrics: Pre Biometrics - 04/08/18 1610      Pre Biometrics   Height  _0  (1.651 m)    Weight  69.3 kg    Waist Circumference  26 inches    Hip Circumference  29 inches    Waist to Hip Ratio  0.9 %    BMI (Calculated)  25.41    Triceps Skinfold  6 mm    % Body Fat  15 %    Grip Strength  32 kg    Flexibility  0 in    Single Leg Stand  10 seconds        Nutrition Therapy Plan and Nutrition Goals: Nutrition Therapy & Goals - 07/01/18 1447      Nutrition Therapy   RD appointment deferred  Yes      Personal Nutrition Goals   Comments  Patient says she is trying to eat a heart healthy diet.  Her appetite continues to improve with treatment of nausa. She has maintained her weight since last 30 day review. Will continue to monitor for progress.        Nutrition Assessments: Nutrition Assessments - 04/08/18 1620      MEDFICTS Scores   Pre Score  27       Nutrition Goals Re-Evaluation:   Nutrition Goals Discharge (Final Nutrition Goals Re-Evaluation):   Psychosocial: Target Goals: Acknowledge presence or absence of significant  depression and/or stress, maximize coping skills, provide positive support system. Participant is able to verbalize types and ability to use techniques and skills needed for reducing stress and depression.  Initial Review & Psychosocial Screening: Initial Psych Review & Screening - 04/08/18 1612      Initial Review   Current issues with  None Identified      Family Dynamics   Good Support System?  Yes      Barriers   Psychosocial barriers to participate in program  There are no identifiable barriers or psychosocial needs.  Screening Interventions   Interventions  Encouraged to exercise    Expected Outcomes  Short Term goal: Identification and review with participant of any Quality of Life or Depression concerns found by scoring the questionnaire.;Long Term goal: The participant improves quality of Life and PHQ9 Scores as seen by post scores and/or verbalization of changes       Quality of Life Scores: Quality of Life - 04/08/18 1613      Quality of Life   Select  Quality of Life      Quality of Life Scores   Health/Function Pre  20.38 %    Socioeconomic Pre  19.71 %    Psych/Spiritual Pre  20.14 %    Family Pre  20.6 %    GLOBAL Pre  20.23 %      Scores of 19 and below usually indicate a poorer quality of life in these areas.  A difference of  2-3 points is a clinically meaningful difference.  A difference of 2-3 points in the total score of the Quality of Life Index has been associated with significant improvement in overall quality of life, self-image, physical symptoms, and general health in studies assessing change in quality of life.   PHQ-9: Recent Review Flowsheet Data    Depression screen [facility] 2/9 04/08/2018   Decreased Interest 2   Down, Depressed, Hopeless 1   PHQ - 2 Score 3   Altered sleeping 0   Tired, decreased energy 1   Change in appetite 1   Feeling bad or failure about yourself  1   Trouble concentrating 1   Moving slowly or fidgety/restless 0    Suicidal thoughts 0   PHQ-9 Score 7   Difficult doing work/chores Somewhat difficult     Interpretation of Total Score  Total Score Depression Severity:  1-4 = Minimal depression, 5-9 = Mild depression, 10-14 = Moderate depression, 15-19 = Moderately severe depression, 20-27 = Severe depression   Psychosocial Evaluation and Intervention: Psychosocial Evaluation - 04/08/18 1613      Psychosocial Evaluation & Interventions   Interventions  Encouraged to exercise with the program and follow exercise prescription    Continue Psychosocial Services   No Follow up required       Psychosocial Re-Evaluation: Psychosocial Re-Evaluation    [place] Name 05/13/18 1557 06/03/18 1511 07/01/18 1450         Psychosocial Re-Evaluation   Current issues with  Current Depression  Current Depression  Current Depression     Comments  Patient's initial QOL score was 20.33 and her PHQ-9 score was 7. She says she is depressed but refuses any treatment. She feels she is able to manage it on her own.   Patient's initial QOL score was 20.33 and her PHQ-9 score was 7. She says she feels her depression has improved especially since her appetite is improving.   Patient's initial QOL score was 20.33 and her PHQ-9 score was 7. She says she feels her depression continues to improve especially since her appetite is improving.      Expected Outcomes  Patient's QOL and PHQ-9 score will improve and she will verbalize improvement in her depression at discharge.   Patient's QOL and PHQ-9 score will improve and she will verbalize improvement in her depression at discharge.   Patient's QOL and PHQ-9 score will improve and she will verbalize improvement in her depression at discharge.      Interventions  Stress management education;Encouraged to attend Pulmonary Rehabilitation for the exercise;Relaxation education  Stress management education;Encouraged to attend Pulmonary Rehabilitation for the exercise;Relaxation education  Stress  management education;Encouraged to attend Pulmonary Rehabilitation for the exercise;Relaxation education     Continue Psychosocial Services   Follow up required by staff  Follow up required by staff  Follow up required by staff        Psychosocial Discharge (Final Psychosocial Re-Evaluation): Psychosocial Re-Evaluation - 07/01/18 1450      Psychosocial Re-Evaluation   Current issues with  Current Depression    Comments  Patient's initial QOL score was 20.33 and her PHQ-9 score was 7. She says she feels her depression continues to improve especially since her appetite is improving.     Expected Outcomes  Patient's QOL and PHQ-9 score will improve and she will verbalize improvement in her depression at discharge.     Interventions  Stress management education;Encouraged to attend Pulmonary Rehabilitation for the exercise;Relaxation education    Continue Psychosocial Services   Follow up required by staff        Education: Education Goals: Education classes will be provided on a weekly basis, covering required topics. Participant will state understanding/return demonstration of topics presented.  Learning Barriers/Preferences: Learning Barriers/Preferences - 04/08/18 1520      Learning Barriers/Preferences   Learning Barriers  None    Learning Preferences  Skilled Demonstration;Group Instruction;Verbal Instruction       Education Topics: How Lungs Work and Diseases: - Discuss the anatomy of the lungs and diseases that can affect the lungs, such as COPD.   PULMONARY REHAB OTHER RESPIRATORY from 07/01/2018 in [place]  Date  07/01/18  Educator  DWynetta Emery  Instruction Review Code  2- Demonstrated Understanding      Exercise: -Discuss the importance of exercise, FITT principles of exercise, normal and abnormal responses to exercise, and how to exercise safely.   Environmental Irritants: -Discuss types of environmental irritants and how to limit exposure  to environmental irritants.   Meds/Inhalers and oxygen: - Discuss respiratory medications, definition of an inhaler and oxygen, and the proper way to use an inhaler and oxygen.   PULMONARY REHAB OTHER RESPIRATORY from 07/01/2018 in [place]  Date  04/15/18  Educator  D. Wynetta Emery      Energy Saving Techniques: - Discuss methods to conserve energy and decrease shortness of breath when performing activities of daily living.    Bronchial Hygiene / Breathing Techniques: - Discuss breathing mechanics, pursed-lip breathing technique,  proper posture, effective ways to clear airways, and other functional breathing techniques   PULMONARY REHAB OTHER RESPIRATORY from 07/01/2018 in [place]  Date  04/29/18  Educator  Etheleen Mayhew  Instruction Review Code  2- Demonstrated Understanding      Cleaning Equipment: - Provides group verbal and written instruction about the health risks of elevated stress, cause of high stress, and healthy ways to reduce stress.   Nutrition I: Fats: - Discuss the types of cholesterol, what cholesterol does to the body, and how cholesterol levels can be controlled.   Nutrition II: Labels: -Discuss the different components of food labels and how to read food labels.   PULMONARY REHAB OTHER RESPIRATORY from 07/01/2018 in [place]  Date  05/20/18  Educator  DWynetta Emery  Instruction Review Code  2- Demonstrated Understanding      Respiratory Infections: - Discuss the signs and symptoms of respiratory infections, ways to prevent respiratory infections, and the importance of seeking medical treatment when having a respiratory infection.  PULMONARY REHAB OTHER RESPIRATORY from 07/01/2018 in [place]  Date  06/03/18  Educator  DWynetta Emery  Instruction Review Code  2- Demonstrated Understanding      Stress I: Signs and Symptoms: - Discuss the causes of stress, how stress  may lead to anxiety and depression, and ways to limit stress.   PULMONARY REHAB OTHER RESPIRATORY from 07/01/2018 in [place]  Date  06/10/18  Educator  Etheleen Mayhew  Instruction Review Code  2- Demonstrated Understanding      Stress II: Relaxation: -Discuss relaxation techniques to limit stress.   PULMONARY REHAB OTHER RESPIRATORY from 07/01/2018 in [place]  Date  06/17/18  Educator  Etheleen Mayhew  Instruction Review Code  2- Demonstrated Understanding      Oxygen for Home/Travel: - Discuss how to prepare for travel when on oxygen and proper ways to transport and store oxygen to ensure safety.   Knowledge Questionnaire Score: Knowledge Questionnaire Score - 04/08/18 1621      Knowledge Questionnaire Score   Pre Score  12/18       Core Components/Risk Factors/Patient Goals at Admission: Personal Goals and Risk Factors at Admission - 04/08/18 1621      Core Components/Risk Factors/Patient Goals on Admission    Weight Management  Weight Maintenance    Personal Goal Other  Yes    Personal Goal  Gain energy, increase appetite, continue to get stronger through PR program    Intervention  Attend class 2 x week, supplement with 3 x week exercise at home.     Expected Outcomes  Reach personal goals.        Core Components/Risk Factors/Patient Goals Review:  Goals and Risk Factor Review    Row Name 05/13/18 1553 06/03/18 1506 07/01/18 1449         Core Components/Risk Factors/Patient Goals Review   Personal Goals Review  Improve shortness of breath with ADL's;Other Get energy back; increase appetite.   Improve shortness of breath with ADL's;Other Get energy back; increase appetite.   Improve shortness of breath with ADL's;Other Get energy back; increase appetitie.      Review  Patient has completed 6 sessions losing 4 lbs since her initial visit. Her attendance has been inconsistent due to illness, MD appointments and procedures. She  recently has a UTI and was prescribed oxygen therapy. Her progress in the program has been impeded by her inconsistent attendance. Will continue to monitor for progress.   Patient has completed 11 sessions maintaining her weight since last 30 day review; however, she says she has gained 4 lbs on her scales in recent weeks. Her appetite has improved and she is cooking more now. Her gastroenterologist adjusted her medications treating her nausea. She does feel better. She says she does feel stronger and is breathing better since they put her on O2. Will continue to monitor for progress.   Patient has completed 17 sessions maintaining her weight since last 30 day review. She says her appetite continues to improve. She is doing well in the program with progression. She says she feels stronger and has more stamina. She is pleased with her progress in the program. Will continue to monitor for progress.      Expected Outcomes  Patient will continue to attend sessions and complete the program and meet her personal goals.   Patient will continue to attend sessions and complete the program and meet her personal goals.   Patient will continue to attend  sessions and complete the program and meet her personal goals.         Core Components/Risk Factors/Patient Goals at Discharge (Final Review):  Goals and Risk Factor Review - 07/01/18 1449      Core Components/Risk Factors/Patient Goals Review   Personal Goals Review  Improve shortness of breath with ADL's;Other   Get energy back; increase appetitie.    Review  Patient has completed 17 sessions maintaining her weight since last 30 day review. She says her appetite continues to improve. She is doing well in the program with progression. She says she feels stronger and has more stamina. She is pleased with her progress in the program. Will continue to monitor for progress.     Expected Outcomes  Patient will continue to attend sessions and complete the program and meet  her personal goals.        ITP Comments: ITP Comments    Row Name 04/22/18 1555           ITP Comments  Patient is new to the program. She has completed 3 sessions. She has been out for this week due to having an endoscopy procedure done on 04/20/18 with 2 biopsy's taken and dilation of esophagus with wide spread inflammation found of her stomach. Also had liver scan with fatty liver. Will have a follow up liver scan in 3 to 6 months. Will continue to monitor for progress.           Comments: ITP REVIEW Pt is making expected progress toward pulmonary rehab goals after completing 17 sessions. Recommend continued exercise, life style modification, education, and utilization of breathing techniques to increase stamina and strength and decrease shortness of breath with exertion.

## 2018-07-06 ENCOUNTER — Encounter (HOSPITAL_COMMUNITY): Payer: Medicare HMO

## 2018-07-06 NOTE — Telephone Encounter (Signed)
Called and spoke with patient regarding OFEV is approved with [employer] until 08-25-2019  Informed the patient of results and recommendations today. Pt verbalized understanding and denied any questions or concerns at this time.  Nothing further needed.

## 2018-07-07 DIAGNOSIS — M1611 Unilateral primary osteoarthritis, right hip: Secondary | ICD-10-CM | POA: Diagnosis not present

## 2018-07-07 DIAGNOSIS — Z96649 Presence of unspecified artificial hip joint: Secondary | ICD-10-CM | POA: Diagnosis not present

## 2018-07-07 DIAGNOSIS — R0902 Hypoxemia: Secondary | ICD-10-CM | POA: Diagnosis not present

## 2018-07-07 DIAGNOSIS — J849 Interstitial pulmonary disease, unspecified: Secondary | ICD-10-CM | POA: Diagnosis not present

## 2018-07-08 ENCOUNTER — Encounter (HOSPITAL_COMMUNITY)
Admission: RE | Admit: 2018-07-08 | Discharge: 2018-07-08 | Disposition: A | Discharge status: Home / Self Care | Payer: Medicare HMO | Source: Ambulatory Visit | Attending: Internal Medicine | Admitting: Internal Medicine

## 2018-07-08 DIAGNOSIS — Z87891 Personal history of nicotine dependence: Secondary | ICD-10-CM | POA: Diagnosis not present

## 2018-07-08 DIAGNOSIS — J849 Interstitial pulmonary disease, unspecified: Secondary | ICD-10-CM | POA: Diagnosis not present

## 2018-07-08 DIAGNOSIS — Z7982 Long term (current) use of aspirin: Secondary | ICD-10-CM | POA: Diagnosis not present

## 2018-07-08 DIAGNOSIS — Z79899 Other long term (current) drug therapy: Secondary | ICD-10-CM | POA: Diagnosis not present

## 2018-07-08 NOTE — Progress Notes (Signed)
Daily Session Note  Patient Details  Name: Ashlee Martin MRN: [id number] Date of Birth: [birth year] Referring Provider:     PULMONARY REHAB OTHER RESP ORIENTATION from 04/08/2018 in [place]  Referring Provider  Ramaswamy      Encounter Date: 07/08/2018  Check In: Session Check In - 07/08/18 1506      Check-In   Supervising physician immediately available to respond to emergencies  See telemetry face sheet for immediately available MD    Location  [facility]    Staff Present  [name], Exercise Physiologist;Other    Medication changes reported      No    Fall or balance concerns reported     No    Tobacco Cessation  No Change    Warm-up and Cool-down  Performed as group-led instruction    Resistance Training Performed  Yes    VAD Patient?  No    PAD/SET Patient?  No      Pain Assessment   Currently in Pain?  No/denies    Pain Score  0-No pain    Multiple Pain Sites  No       Capillary Blood Glucose: No results found for this or any previous visit (from the past 24 hour(s)).    Social History   Tobacco Use  Smoking Status Former Smoker  . Packs/day: 3.00  . Years: 20.00  . Pack years: 60.00  . Types: Cigarettes  . Last attempt to quit: 11/03/1972  . Years since quitting: 45.7  Smokeless Tobacco Never Used    Goals Met:  Proper associated with RPD/PD & O2 Sat Independence with exercise equipment Using PLB without cueing & demonstrates good technique Exercise tolerated well No report of cardiac concerns or symptoms Strength training completed today  Goals Unmet:  Not Applicable  Comments: Pt able to follow exercise prescription today without complaint.  Will continue to monitor for progression. Check out 2:30.   Dr. Sinda Du is Medical Director for [facility].

## 2018-07-12 DIAGNOSIS — Z6824 Body mass index (BMI) 24.0-24.9, adult: Secondary | ICD-10-CM | POA: Diagnosis not present

## 2018-07-12 DIAGNOSIS — Z299 Encounter for prophylactic measures, unspecified: Secondary | ICD-10-CM | POA: Diagnosis not present

## 2018-07-12 DIAGNOSIS — J8410 Pulmonary fibrosis, unspecified: Secondary | ICD-10-CM | POA: Diagnosis not present

## 2018-07-12 DIAGNOSIS — I10 Essential (primary) hypertension: Secondary | ICD-10-CM | POA: Diagnosis not present

## 2018-07-13 ENCOUNTER — Encounter (HOSPITAL_COMMUNITY)
Admission: RE | Admit: 2018-07-13 | Discharge: 2018-07-13 | Disposition: A | Discharge status: Home / Self Care | Payer: Medicare HMO | Source: Ambulatory Visit | Attending: Internal Medicine | Admitting: Internal Medicine

## 2018-07-13 DIAGNOSIS — Z87891 Personal history of nicotine dependence: Secondary | ICD-10-CM | POA: Diagnosis not present

## 2018-07-13 DIAGNOSIS — Z7982 Long term (current) use of aspirin: Secondary | ICD-10-CM | POA: Diagnosis not present

## 2018-07-13 DIAGNOSIS — J849 Interstitial pulmonary disease, unspecified: Secondary | ICD-10-CM | POA: Diagnosis not present

## 2018-07-13 DIAGNOSIS — Z79899 Other long term (current) drug therapy: Secondary | ICD-10-CM | POA: Diagnosis not present

## 2018-07-13 NOTE — Progress Notes (Signed)
Daily Session Note  Patient Details  Name: AZIYAH PROVENCAL MRN: [id number] Date of Birth: 12-22-1930 Referring Provider:     PULMONARY REHAB OTHER RESP ORIENTATION from 04/08/2018 in [place]  Referring Provider  Ramaswamy      Encounter Date: 07/13/2018  Check In: Session Check In - 07/13/18 1330      Check-In   Supervising physician immediately available to respond to emergencies  See telemetry face sheet for immediately available MD    Location  [facility]    Staff Present  Russella Dar, MS, EP, [facility], Exercise Physiologist;Yousaf Sainato Zachery Conch, Exercise Physiologist    Medication changes reported      No    Fall or balance concerns reported     No    Tobacco Cessation  No Change    Warm-up and Cool-down  Performed as group-led instruction    Resistance Training Performed  Yes    VAD Patient?  No    PAD/SET Patient?  No      Pain Assessment   Currently in Pain?  No/denies    Pain Score  0-No pain    Multiple Pain Sites  No       Capillary Blood Glucose: No results found for this or any previous visit (from the past 24 hour(s)).    Social History   Tobacco Use  Smoking Status Former Smoker  . Packs/day: 3.00  . Years: 20.00  . Pack years: 60.00  . Types: Cigarettes  . Last attempt to quit: 11/03/1972  . Years since quitting: 45.7  Smokeless Tobacco Never Used    Goals Met:  Proper associated with RPD/PD & O2 Sat Independence with exercise equipment Using PLB without cueing & demonstrates good technique Exercise tolerated well No report of cardiac concerns or symptoms Strength training completed today  Goals Unmet:  Not Applicable  Comments: Pt able to follow exercise prescription today without complaint.  Will continue to monitor for progression. Check out 2:30.   Dr. Sinda Du is Medical Director for [facility] Pulmonary Rehab.

## 2018-07-15 ENCOUNTER — Encounter (HOSPITAL_COMMUNITY)
Admission: RE | Admit: 2018-07-15 | Discharge: 2018-07-15 | Disposition: A | Discharge status: Home / Self Care | Payer: Medicare HMO | Source: Ambulatory Visit | Attending: Internal Medicine | Admitting: Internal Medicine

## 2018-07-15 DIAGNOSIS — Z87891 Personal history of nicotine dependence: Secondary | ICD-10-CM | POA: Diagnosis not present

## 2018-07-15 DIAGNOSIS — J849 Interstitial pulmonary disease, unspecified: Secondary | ICD-10-CM | POA: Diagnosis not present

## 2018-07-15 DIAGNOSIS — Z79899 Other long term (current) drug therapy: Secondary | ICD-10-CM | POA: Diagnosis not present

## 2018-07-15 DIAGNOSIS — Z7982 Long term (current) use of aspirin: Secondary | ICD-10-CM | POA: Diagnosis not present

## 2018-07-15 NOTE — Progress Notes (Signed)
Daily Session Note  Patient Details  Name: YATZARY MERRIWEATHER MRN: [id number] Date of Birth: 1930-09-21 Referring Provider:     PULMONARY REHAB OTHER RESP ORIENTATION from 04/08/2018 in [place]  Referring Provider  Ramaswamy      Encounter Date: 07/15/2018  Check In: Session Check In - 07/15/18 1330      Check-In   Supervising physician immediately available to respond to emergencies  See telemetry face sheet for immediately available MD    Location  [facility]    Staff Present  [name], Exercise Physiologist;Debra Wynetta Emery, RN, BSN    Medication changes reported      No    Fall or balance concerns reported     No    Tobacco Cessation  No Change    Warm-up and Cool-down  Performed as group-led instruction    Resistance Training Performed  Yes    VAD Patient?  No    PAD/SET Patient?  No      Pain Assessment   Currently in Pain?  No/denies    Pain Score  0-No pain    Multiple Pain Sites  No       Capillary Blood Glucose: No results found for this or any previous visit (from the past 24 hour(s)).    Social History   Tobacco Use  Smoking Status Former Smoker  . Packs/day: 3.00  . Years: 20.00  . Pack years: 60.00  . Types: Cigarettes  . Last attempt to quit: 11/03/1972  . Years since quitting: 45.7  Smokeless Tobacco Never Used    Goals Met:  Proper associated with RPD/PD & O2 Sat Independence with exercise equipment Improved SOB with ADL's Using PLB without cueing & demonstrates good technique Exercise tolerated well No report of cardiac concerns or symptoms Strength training completed today  Goals Unmet:  Not Applicable  Comments: Pt able to follow exercise prescription today without complaint.  Will continue to monitor for progression. Check out 1430.   Dr. Sinda Du is Medical Director for [facility] Pulmonary Rehab.

## 2018-07-20 ENCOUNTER — Encounter (HOSPITAL_COMMUNITY): Payer: Medicare HMO

## 2018-07-22 ENCOUNTER — Encounter (HOSPITAL_COMMUNITY): Payer: Medicare HMO

## 2018-07-27 ENCOUNTER — Encounter (HOSPITAL_COMMUNITY)
Admission: RE | Admit: 2018-07-27 | Discharge: 2018-07-27 | Disposition: A | Discharge status: Home / Self Care | Payer: Medicare HMO | Source: Ambulatory Visit | Attending: Internal Medicine | Admitting: Internal Medicine

## 2018-07-27 DIAGNOSIS — Z7982 Long term (current) use of aspirin: Secondary | ICD-10-CM | POA: Diagnosis not present

## 2018-07-27 DIAGNOSIS — Z79899 Other long term (current) drug therapy: Secondary | ICD-10-CM | POA: Insufficient documentation

## 2018-07-27 DIAGNOSIS — Z87891 Personal history of nicotine dependence: Secondary | ICD-10-CM | POA: Diagnosis not present

## 2018-07-27 DIAGNOSIS — J849 Interstitial pulmonary disease, unspecified: Secondary | ICD-10-CM

## 2018-07-27 NOTE — Progress Notes (Signed)
Daily Session Note  Patient Details  Name: Ashlee Martin MRN: [id number] Date of Birth: [birth year] Referring Provider:     PULMONARY REHAB OTHER RESP ORIENTATION from 04/08/2018 in [place]  Referring Provider  Ramaswamy      Encounter Date: 07/27/2018  Check In: Session Check In - 07/27/18 1330      Check-In   Supervising physician immediately available to respond to emergencies  See telemetry face sheet for immediately available MD    Location  [facility]    Staff Present  Russella Dar, MS, EP, [facility], Exercise Physiologist;Christabella Alvira Zachery Conch, Exercise Physiologist    Medication changes reported      No    Fall or balance concerns reported     No    Tobacco Cessation  No Change    Warm-up and Cool-down  Performed as group-led instruction    VAD Patient?  No    PAD/SET Patient?  No      Pain Assessment   Currently in Pain?  No/denies    Pain Score  0-No pain    Multiple Pain Sites  No       Capillary Blood Glucose: No results found for this or any previous visit (from the past 24 hour(s)).    Social History   Tobacco Use  Smoking Status Former Smoker  . Packs/day: 3.00  . Years: 20.00  . Pack years: 60.00  . Types: Cigarettes  . Last attempt to quit: 11/03/1972  . Years since quitting: 45.7  Smokeless Tobacco Never Used    Goals Met:  Proper associated with RPD/PD & O2 Sat Independence with exercise equipment Using PLB without cueing & demonstrates good technique Exercise tolerated well No report of cardiac concerns or symptoms Strength training completed today  Goals Unmet:  Not Applicable  Comments: Pt able to follow exercise prescription today without complaint.  Will continue to monitor for progression. Check out 2:30.   Dr. Sinda Du is Medical Director for [facility] Pul[facility] Re[facility].

## 2018-07-28 NOTE — Progress Notes (Signed)
Pulmonary Individual Treatment Plan  Patient Details  Name: Ashlee Martin MRN: [id number] Date of Birth: 1931/06/11 Referring Provider:     PULMONARY REHAB OTHER RESP ORIENTATION from 04/08/2018 in [place]  Referring Provider  [place]      Initial Encounter Date:    PULMONARY REHAB OTHER RESP ORIENTATION from 04/08/2018 in [place]  Date  04/08/18      Visit Diagnosis: ILD (interstitial lung disease) ([place])  Patient's Home Medications on Admission:   Current Outpatient Medications:  .  atenolol (TENORMIN) 25 MG tablet, Take 25 mg by mouth 2 (two) times daily. , Disp: , Rfl:  .  benzonatate (TESSALON) 200 MG capsule, Take 1 capsule (200 mg total) by mouth 3 (three) times daily as needed for cough., Disp: 30 capsule, Rfl: 1 .  dextromethorphan-guaiFENesin (MUCINEX DM) 30-600 MG 12hr tablet, Take 1 tablet by mouth See admin instructions. Take 1 tablet daily in the morning, may repeat dose in the evening if needed for congestion., Disp: , Rfl:  .  doxylamine, Sleep, (UNISOM) 25 MG tablet, Take 25 mg by mouth at bedtime. , Disp: , Rfl:  .  Histamine Dihydrochloride (AUSTRALIAN DREAM ARTHRITIS EX), Apply 1 application topically 3 (three) times daily as needed (for pain.)., Disp: , Rfl:  .  pantoprazole (PROTONIX) 40 MG tablet, Take 1 tablet (40 mg total) by mouth daily. 30 minutes before breakfast, Disp: 90 tablet, Rfl: 3 .  Polyethyl Glycol-Propyl Glycol (SYSTANE OP), Place 1 drop into both eyes 3 (three) times daily as needed (dry/irritated eyes.). , Disp: , Rfl:  .  rOPINIRole (REQUIP) 2 MG tablet, Take 2 mg by mouth at bedtime. , Disp: , Rfl:  .  simvastatin (ZOCOR) 20 MG tablet, Take 20 mg by mouth at bedtime. , Disp: , Rfl:  .  VENTOLIN HFA 108 (90 Base) MCG/ACT inhaler, Inhale 1-2 puffs into the lungs every 6 (six) hours as needed (chest congestion/cough). , Disp: , Rfl: 0 .  vitamin B-12 (CYANOCOBALAMIN) 1000 MCG tablet, Take 1,000  mcg by mouth at bedtime. , Disp: , Rfl:   Past Medical History: Past Medical History:  Diagnosis Date  . Arthritis   . Depression   . GERD (gastroesophageal reflux disease)   . Hypercholesterolemia   . Hypertension   . ILD (interstitial lung disease) ([place])   . Psoriasis    had been on Humira but continued to have UTIs, sinus infections, etc. Taken off  . Seizure ([place])    had 3-4 seizures, 8 years ago, placed on medication and has had no seizures since.  . Seizures ([place])     Tobacco Use: Social History   Tobacco Use  Smoking Status Former Smoker  . Packs/day: 3.00  . Years: 20.00  . Pack years: 60.00  . Types: Cigarettes  . Last attempt to quit: 11/03/1972  . Years since quitting: 45.7  Smokeless Tobacco Never Used    Labs: Recent Review Flowsheet Data    There is no flowsheet data to display.      Capillary Blood Glucose: No results found for: GLUCAP   Pulmonary Assessment Scores: Pulmonary Assessment Scores    Row Name 04/08/18 1617         ADL UCSD   ADL Phase  Entry     SOB Score total  41     Rest  0     Walk  8     Stairs  1     Bath  1  Dress  1     Shop  3       CAT Score   CAT Score  16       mMRC Score   mMRC Score  3        Pulmonary Function Assessment: Pulmonary Function Assessment - 04/08/18 1616      Pulmonary Function Tests   FVC%  58 %    FEV1%  62 %    FEV1/FVC Ratio  108    RV%  35 %    DLCO%  30 %      Initial Spirometry Results   FVC%  58 %    FEV1%  62 %    FEV1/FVC Ratio  108      Breath   Bilateral Breath Sounds  Clear    Shortness of Breath  Yes   Mostly during walk test.       Exercise Target Goals: Exercise Program Goal: Individual exercise prescription set using results from initial 6 min walk test and THRR while considering  patient's activity barriers and safety.   Exercise Prescription Goal: Initial exercise prescription builds to 30-45 minutes a day of aerobic activity, 2-3 days per week.   Home exercise guidelines will be given to patient during program as part of exercise prescription that the participant will acknowledge.  Activity Barriers & Risk Stratification:   6 Minute Walk: 6 Minute Walk    Row Name 04/08/18 1537         6 Minute Walk   Phase  Initial     Distance  800 feet     Walk Time  6 minutes     # of Rest Breaks  1     MPH  1.51     METS  2.16     RPE  13     Perceived Dyspnea   13     Resting HR  68 bpm     Resting BP  110/62     Resting Oxygen Saturation   91 %     Exercise Oxygen Saturation  during 6 min walk  88 %     Max Ex. HR  93 bpm     Max Ex. BP  130/64     2 Minute Post BP  124/62        Oxygen Initial Assessment: Oxygen Initial Assessment - 04/08/18 1615      Home Oxygen   Home Oxygen Device  None    Sleep Oxygen Prescription  None    Home Exercise Oxygen Prescription  None    Home at Rest Exercise Oxygen Prescription  None    Compliance with Home Oxygen Use  --   N/A     Initial 6 min Walk   Oxygen Used  None      Program Oxygen Prescription   Program Oxygen Prescription  None       Oxygen Re-Evaluation: Oxygen Re-Evaluation    Row Name 05/13/18 1549 06/03/18 1503 07/01/18 1447 07/28/18 1444       Program Oxygen Prescription   Program Oxygen Prescription  Continuous  Continuous  Continuous  Continuous    Liters per minute  4  4 Exercise O2 at 3 to 4 L/M.  4  3    FiO2%  92  90  93  95      Home Oxygen   Home Oxygen Device  Portable Scientific laboratory technician    Sleep  Oxygen Prescription  Continuous  Continuous  Continuous  Continuous    Liters per minute  _0 Home Exercise Oxygen Prescription  Continuous  Continuous  Continuous  Continuous    Liters per minute  _1 Home at Rest Exercise Oxygen Prescription  Continuous  Continuous  Continuous  -    Liters per minute  _2 Compliance with Home Oxygen Use  Yes  Yes  Yes  Yes       Goals/Expected Outcomes   Short Term Goals  To learn and understand importance of monitoring SPO2 with pulse oximeter and demonstrate accurate use of the pulse oximeter.;To learn and exhibit compliance with exercise, home and travel O2 prescription;To learn and understand importance of maintaining oxygen saturations>88%;To learn and demonstrate proper pursed lip breathing techniques or other breathing techniques.  To learn and understand importance of monitoring SPO2 with pulse oximeter and demonstrate accurate use of the pulse oximeter.;To learn and exhibit compliance with exercise, home and travel O2 prescription;To learn and understand importance of maintaining oxygen saturations>88%;To learn and demonstrate proper pursed lip breathing techniques or other breathing techniques.  To learn and understand importance of monitoring SPO2 with pulse oximeter and demonstrate accurate use of the pulse oximeter.;To learn and exhibit compliance with exercise, home and travel O2 prescription;To learn and understand importance of maintaining oxygen saturations>88%;To learn and demonstrate proper pursed lip breathing techniques or other breathing techniques.  To learn and understand importance of monitoring SPO2 with pulse oximeter and demonstrate accurate use of the pulse oximeter.;To learn and exhibit compliance with exercise, home and travel O2 prescription;To learn and understand importance of maintaining oxygen saturations>88%;To learn and demonstrate proper pursed lip breathing techniques or other breathing techniques.    Long  Term Goals  Exhibits compliance with exercise, home and travel O2 prescription;Verbalizes importance of monitoring SPO2 with pulse oximeter and return demonstration;Maintenance of O2 saturations>88%;Exhibits proper breathing techniques, such as pursed lip breathing or other method taught during program session  Exhibits compliance with exercise, home and travel O2 prescription;Verbalizes  importance of monitoring SPO2 with pulse oximeter and return demonstration;Maintenance of O2 saturations>88%;Exhibits proper breathing techniques, such as pursed lip breathing or other method taught during program session  Exhibits compliance with exercise, home and travel O2 prescription;Verbalizes importance of monitoring SPO2 with pulse oximeter and return demonstration;Maintenance of O2 saturations>88%;Exhibits proper breathing techniques, such as pursed lip breathing or other method taught during program session  Exhibits compliance with exercise, home and travel O2 prescription;Verbalizes importance of monitoring SPO2 with pulse oximeter and return demonstration;Maintenance of O2 saturations>88%;Exhibits proper breathing techniques, such as pursed lip breathing or other method taught during program session    Comments  Patient was recently prescribed O2 at home. Not sure if she is supposed to use continuously or as needed. Will clarify this at her next visit. Patient demonstrates proper pursed lip breathing technique and proper usage of Pulse Oximetry and verbalizes understanding of maintaining her O2 Sat >88%. Will conitnue to monitor.   Patient was recently prescribed O2 at home. Not sure if she is supposed to use continuously or as needed. Will clarify this at her next visit. Patient demonstrates proper pursed lip breathing technique and proper usage of Pulse Oximetry and verbalizes understanding of maintaining her O2 Sat >88%. Will conitnue to monitor.   Patient was recently prescribed O2 at home. Not sure if  she is supposed to use continuously or as needed. Will clarify this at her next visit. Patient demonstrates proper pursed lip breathing technique and proper usage of Pulse Oximetry and verbalizes understanding of maintaining her O2 Sat >88%. Will conitnue to monitor.   Patient was recently prescribed O2 at home. Not sure if she is supposed to use continuously or as needed. Will clarify this at her  next visit. Patient demonstrates proper pursed lip breathing technique and proper usage of Pulse Oximetry and verbalizes understanding of maintaining her O2 Sat >88%. Will conitnue to monitor.     Goals/Expected Outcomes  Patient will continue to meet her short and long term goals.   Patient will continue to meet her short and long term goals.   Patient will continue to meet her short and long term goals.   Patient will continue to meet her short and long term goals.        Oxygen Discharge (Final Oxygen Re-Evaluation): Oxygen Re-Evaluation - 07/28/18 1444      Program Oxygen Prescription   Program Oxygen Prescription  Continuous    Liters per minute  3    FiO2%  95      Home Oxygen   Home Oxygen Device  Portable Concentrator    Sleep Oxygen Prescription  Continuous    Liters per minute  2    Home Exercise Oxygen Prescription  Continuous    Liters per minute  4    Liters per minute  2    Compliance with Home Oxygen Use  Yes      Goals/Expected Outcomes   Short Term Goals  To learn and understand importance of monitoring SPO2 with pulse oximeter and demonstrate accurate use of the pulse oximeter.;To learn and exhibit compliance with exercise, home and travel O2 prescription;To learn and understand importance of maintaining oxygen saturations>88%;To learn and demonstrate proper pursed lip breathing techniques or other breathing techniques.    Long  Term Goals  Exhibits compliance with exercise, home and travel O2 prescription;Verbalizes importance of monitoring SPO2 with pulse oximeter and return demonstration;Maintenance of O2 saturations>88%;Exhibits proper breathing techniques, such as pursed lip breathing or other method taught during program session    Comments  Patient was recently prescribed O2 at home. Not sure if she is supposed to use continuously or as needed. Will clarify this at her next visit. Patient demonstrates proper pursed lip breathing technique and proper usage of Pulse  Oximetry and verbalizes understanding of maintaining her O2 Sat >88%. Will conitnue to monitor.     Goals/Expected Outcomes  Patient will continue to meet her short and long term goals.        Initial Exercise Prescription: Initial Exercise Prescription - 04/08/18 1500      Date of Initial Exercise RX and Referring Provider   Date  04/08/18    Referring Provider  Ramaswamy    Expected Discharge Date  07/24/18      Treadmill   MPH  0.8    Grade  0    Minutes  17    METs  1.6      NuStep   Level  1    SPM  42    Minutes  22    METs  1.7      Prescription Details   Frequency (times per week)  2    Duration  Progress to 30 minutes of continuous aerobic without signs/symptoms of physical distress      Intensity   THRR 40-80% of  Max Heartrate  [phone]    Ratings of Perceived Exertion  11-13    Perceived Dyspnea  0-4      Progression   Progression  Continue progressive overload as per policy without signs/symptoms or physical distress.      Resistance Training   Training Prescription  Yes    Weight  1    Reps  10-15       Perform Capillary Blood Glucose checks as needed.  Exercise Prescription Changes:  Exercise Prescription Changes    Row Name 04/22/18 0700 04/30/18 1400 05/27/18 0800 06/15/18 1400 07/01/18 1500     Response to Exercise   Blood Pressure (Admit)  132/60  102/60  102/62  144/66  120/60   Blood Pressure (Exercise)  152/68  132/68  126/64  144/66  134/60   Blood Pressure (Exit)  136/60  110/66  106/64  118/64  116/60   Heart Rate (Admit)  68 bpm  77 bpm  96 bpm  71 bpm  75 bpm   Heart Rate (Exercise)  96 bpm  79 bpm  113 bpm  84 bpm  87 bpm   Heart Rate (Exit)  76 bpm  79 bpm  103 bpm  77 bpm  97 bpm   Oxygen Saturation (Admit)  90 %  93 %  97 %  94 %  93 %   Oxygen Saturation (Exercise)  97 %  90 %  93 %  94 %  91 %   Oxygen Saturation (Exit)  93 %  93 %  97 %  90 %  90 %   Rating of Perceived Exertion (Exercise)  _0 Perceived Dyspnea (Exercise)  _1 Symptoms  - SOB  -  -  -  -   Duration  Progress to 30 minutes of  aerobic without signs/symptoms of physical distress  Progress to 30 minutes of  aerobic without signs/symptoms of physical distress  Progress to 30 minutes of  aerobic without signs/symptoms of physical distress  Progress to 30 minutes of  aerobic without signs/symptoms of physical distress  Progress to 30 minutes of  aerobic without signs/symptoms of physical distress   Intensity  THRR New [phone]  THRR unchanged  THRR unchanged  THRR unchanged  THRR unchanged     Progression   Progression  Continue to progress workloads to maintain intensity without signs/symptoms of physical distress.  Continue to progress workloads to maintain intensity without signs/symptoms of physical distress.  Continue to progress workloads to maintain intensity without signs/symptoms of physical distress.  Continue to progress workloads to maintain intensity without signs/symptoms of physical distress.  Continue to progress workloads to maintain intensity without signs/symptoms of physical distress.   Average METs  1.98  1.5  1.78  1.9  1.9     Resistance Training   Training Prescription  No  Yes  Yes  Yes  Yes   Weight  _2 Reps  10-15  10-15  10-15  10-15  10-15   Time  5 Minutes  5 Minutes  5 Minutes  5 Minutes  5 Minutes     Oxygen   Oxygen  -  -  Continuous  Continuous  Continuous   Liters  -  -  _3 Treadmill   MPH  0.8  0.8  1  1.2  1.3   Grade  0  0  0  0  0   Minutes  _0 METs  1.6  1.61  1.76  1.9  1.99     NuStep   Level  _1 SPM  62  84  85  85  92   Minutes  _2 METs  1.6  1.5  1.8  1.9  1.8     Home Exercise Plan   Plans to continue exercise at  -  -  -  Home (comment) walking  Home (comment)   Frequency  -  -  -  Add 3 additional days to program exercise sessions.  Add 3 additional days to program exercise  sessions.   Initial Home Exercises Provided  -  -  -  04/29/18  04/29/18   Row Name 07/16/18 1400             Response to Exercise   Blood Pressure (Admit)  114/62       Blood Pressure (Exercise)  114/60       Blood Pressure (Exit)  116/64       Heart Rate (Admit)  80 bpm       Heart Rate (Exercise)  83 bpm       Heart Rate (Exit)  82 bpm       Oxygen Saturation (Admit)  94 %       Oxygen Saturation (Exercise)  92 %       Oxygen Saturation (Exit)  98 %       Rating of Perceived Exertion (Exercise)  11       Perceived Dyspnea (Exercise)  12       Duration  Progress to 30 minutes of  aerobic without signs/symptoms of physical distress       Intensity  THRR unchanged         Progression   Progression  Continue to progress workloads to maintain intensity without signs/symptoms of physical distress.       Average METs  1.92         Resistance Training   Training Prescription  Yes       Weight  3       Reps  10-15       Time  5 Minutes         Oxygen   Oxygen  Continuous       Liters  3         Treadmill   MPH  1.5       Grade  0       Minutes  17       METs  2.14         NuStep   Level  2       SPM  110       Minutes  22       METs  1.8         Home Exercise Plan   Plans to continue exercise at  Home (comment)       Frequency  Add 3 additional days to program exercise sessions.       Initial Home Exercises Provided  04/29/18          Exercise Comments:  Exercise Comments  [place] Name 04/22/18 0802 04/30/18 1454 05/14/18 0749 06/01/18 1322 06/30/18 1515   Exercise Comments  Patient is not feeling her best due to her stomach issues. She wants to come and wants to get stronger.   Patient still experiences weakness due to stomach pain but has continued to attend rehab to reach her goals.   Patient is doing better. She needs to be more consistent.   Patient has been attending regularly and seems to be getting stronger. Supplemental oxygen has helped her a lot.    Patient is back after taking last week off for doctors appointments. She is feeling stronger and says she's able to cook more for her husband.    [place] Name 07/20/18 1454           Exercise Comments  Paitent has done well over the past 2 weeks. She reports feeling stronger and is excited to be able to eat more.           Exercise Goals and Review:  Exercise Goals    Row Name 04/08/18 1553             Exercise Goals   Increase Physical Activity  Yes       Intervention  Provide advice, education, support and counseling about physical activity/exercise needs.;Develop an individualized exercise prescription for aerobic and resistive training based on initial evaluation findings, risk stratification, comorbidities and participant's personal goals.       Expected Outcomes  Short Term: Attend rehab on a regular basis to increase amount of physical activity.;Long Term: Add in home exercise to make exercise part of routine and to increase amount of physical activity.       Increase Strength and Stamina  Yes       Intervention  Provide advice, education, support and counseling about physical activity/exercise needs.;Develop an individualized exercise prescription for aerobic and resistive training based on initial evaluation findings, risk stratification, comorbidities and participant's personal goals.       Expected Outcomes  Short Term: Increase workloads from initial exercise prescription for resistance, speed, and METs.;Long Term: Improve cardiorespiratory fitness, muscular endurance and strength as measured by increased METs and functional capacity (6MWT)       Able to understand and use rate of perceived exertion (RPE) scale  Yes       Intervention  Provide education and explanation on how to use RPE scale       Expected Outcomes  Short Term: Able to use RPE daily in rehab to express subjective intensity level;Long Term:  Able to use RPE to guide intensity level when exercising independently        Able to understand and use Dyspnea scale  Yes       Intervention  Provide education and explanation on how to use Dyspnea scale       Expected Outcomes  Short Term: Able to use Dyspnea scale daily in rehab to express subjective sense of shortness of breath during exertion;Long Term: Able to use Dyspnea scale to guide intensity level when exercising independently       Knowledge and understanding of Target Heart Rate Range (THRR)  Yes       Intervention  Provide education and explanation of THRR including how the numbers were predicted and where they are located for reference       Expected Outcomes  Short Term: Able to use daily as guideline for intensity in rehab;Long Term: Able to use THRR to govern intensity when exercising independently  Able to check pulse independently  Yes       Intervention  Provide education and demonstration on how to check pulse in carotid and radial arteries.;Review the importance of being able to check your own pulse for safety during independent exercise       Expected Outcomes  Short Term: Able to explain why pulse checking is important during independent exercise;Long Term: Able to check pulse independently and accurately       Understanding of Exercise Prescription  Yes       Intervention  Provide education, explanation, and written materials on patient's individual exercise prescription       Expected Outcomes  Short Term: Able to explain program exercise prescription;Long Term: Able to explain home exercise prescription to exercise independently          Exercise Goals Re-Evaluation : Exercise Goals Re-Evaluation    Row Name 04/22/18 0752 04/30/18 1452 05/14/18 0745 06/01/18 1320 06/30/18 1513     Exercise Goal Re-Evaluation   Exercise Goals Review  Increase Physical Activity;Increase Strength and Stamina;Able to understand and use Dyspnea scale;Understanding of Exercise Prescription;Knowledge and understanding of Target Heart Rate Range (THRR)   Increase Physical Activity;Increase Strength and Stamina;Able to understand and use Dyspnea scale;Understanding of Exercise Prescription;Knowledge and understanding of Target Heart Rate Range (THRR)  Increase Physical Activity;Increase Strength and Stamina;Able to understand and use Dyspnea scale;Understanding of Exercise Prescription;Knowledge and understanding of Target Heart Rate Range (THRR)  Increase Physical Activity;Increase Strength and Stamina;Able to understand and use Dyspnea scale;Understanding of Exercise Prescription;Knowledge and understanding of Target Heart Rate Range (THRR)  Increase Physical Activity;Increase Strength and Stamina;Able to understand and use Dyspnea scale;Understanding of Exercise Prescription;Knowledge and understanding of Target Heart Rate Range (THRR)   Comments  Patient is still new to the program. She is having some stomach issues that makes it difficult for her to eat. When she comes she is sometimes weak because of not eating. She is seeing her doctor to determine what the problem is. She has not been progressed yet due to having doctors appointments about her stomach. She does like coming to the program and hopes it will help her reach her goals.   Patient has had 4 visits so far. She is still weak due to her stomach pains but will hopefully have those issues resolved soon.   Patient has had 6 visits so far. She is still weak due to her last episode of having urinary track infection. She is on an antibiotics and they have added supplemental  oxygen. Will clarify the liter flow on her next visit.   Patient has been doing well in the program since being place on oxygen. She is working out on [employer]. She seems to be getting around better with it although she doesn't want it. Will continue to monitor and progress.   Patient continues to do well in the program. She has adjusted to having her oxygen full time and now sees the benefit. She is handling increases on boththe treadmill  and nustep well.    Expected Outcomes  Get energy back, iIncrease appetite and continue to get stronger.   Get energy back, iIncrease appetite and continue to get stronger.   Get energy back, iIncrease appetite and continue to get stronger.   Increase energy levels, appetite and get stronger.   Increase energy levels, appetite and get stronger.    [place] Name 07/20/18 1453             Exercise Goal Re-Evaluation  Exercise Goals Review  Increase Physical Activity;Increase Strength and Stamina;Able to understand and use Dyspnea scale;Understanding of Exercise Prescription;Knowledge and understanding of Target Heart Rate Range (THRR)       Comments  Patient is doing well in the program. She is now walking 1.46mh on the treadmill with ease. She has regained her appetite and has been able to put on a few pounds. We will continue to progress her as we see fit.        Expected Outcomes  Increase energy levels and stamina.           Discharge Exercise Prescription (Final Exercise Prescription Changes): Exercise Prescription Changes - 07/16/18 1400      Response to Exercise   Blood Pressure (Admit)  114/62    Blood Pressure (Exercise)  114/60    Blood Pressure (Exit)  116/64    Heart Rate (Admit)  80 bpm    Heart Rate (Exercise)  83 bpm    Heart Rate (Exit)  82 bpm    Oxygen Saturation (Admit)  94 %    Oxygen Saturation (Exercise)  92 %    Oxygen Saturation (Exit)  98 %    Rating of Perceived Exertion (Exercise)  11    Perceived Dyspnea (Exercise)  12    Duration  Progress to 30 minutes of  aerobic without signs/symptoms of physical distress    Intensity  THRR unchanged      Progression   Progression  Continue to progress workloads to maintain intensity without signs/symptoms of physical distress.    Average METs  1.92      Resistance Training   Training Prescription  Yes    Weight  3    Reps  10-15    Time  5 Minutes      Oxygen   Oxygen  Continuous    Liters  3      Treadmill    MPH  1.5    Grade  0    Minutes  17    METs  2.14      NuStep   Level  2    SPM  110    Minutes  22    METs  1.8      Home Exercise Plan   Plans to continue exercise at  Home (comment)    Frequency  Add 3 additional days to program exercise sessions.    Initial Home Exercises Provided  04/29/18       Nutrition:  Target Goals: Understanding of nutrition guidelines, daily intake of sodium <15074m cholesterol <20048mcalories 30% from fat and 7% or less from saturated fats, daily to have 5 or more servings of fruits and vegetables.  Biometrics: Pre Biometrics - 04/08/18 1610      Pre Biometrics   Height  _0  (1.651 m)    Weight  69.3 kg    Waist Circumference  26 inches    Hip Circumference  29 inches    Waist to Hip Ratio  0.9 %    BMI (Calculated)  25.41    Triceps Skinfold  6 mm    % Body Fat  15 %    Grip Strength  32 kg    Flexibility  0 in    Single Leg Stand  10 seconds        Nutrition Therapy Plan and Nutrition Goals: Nutrition Therapy & Goals - 07/28/18 1445      Nutrition Therapy   RD appointment deferred  Yes  Personal Nutrition Goals   Comments  Patient says she is trying to eat a heart healthy diet.  Her appetite continues to improve with treatment of nausa. She has maintained her weight since last 30 day review. Will continue to monitor for progress.       Intervention Plan   Intervention  Nutrition handout(s) given to patient.       Nutrition Assessments: Nutrition Assessments - 04/08/18 1620      MEDFICTS Scores   Pre Score  27       Nutrition Goals Re-Evaluation:   Nutrition Goals Discharge (Final Nutrition Goals Re-Evaluation):   Psychosocial: Target Goals: Acknowledge presence or absence of significant depression and/or stress, maximize coping skills, provide positive support system. Participant is able to verbalize types and ability to use techniques and skills needed for reducing stress and depression.  Initial Review &  Psychosocial Screening: Initial Psych Review & Screening - 04/08/18 1612      Initial Review   Current issues with  None Identified      Family Dynamics   Good Support System?  Yes      Barriers   Psychosocial barriers to participate in program  There are no identifiable barriers or psychosocial needs.      Screening Interventions   Interventions  Encouraged to exercise    Expected Outcomes  Short Term goal: Identification and review with participant of any Quality of Life or Depression concerns found by scoring the questionnaire.;Long Term goal: The participant improves quality of Life and PHQ9 Scores as seen by post scores and/or verbalization of changes       Quality of Life Scores: Quality of Life - 04/08/18 1613      Quality of Life   Select  Quality of Life      Quality of Life Scores   Health/Function Pre  20.38 %    Socioeconomic Pre  19.71 %    Psych/Spiritual Pre  20.14 %    Family Pre  20.6 %    GLOBAL Pre  20.23 %      Scores of 19 and below usually indicate a poorer quality of life in these areas.  A difference of  2-3 points is a clinically meaningful difference.  A difference of 2-3 points in the total score of the Quality of Life Index has been associated with significant improvement in overall quality of life, self-image, physical symptoms, and general health in studies assessing change in quality of life.   PHQ-9: Recent Review Flowsheet Data    Depression screen [facility] 2/9 04/08/2018   Decreased Interest 2   Down, Depressed, Hopeless 1   PHQ - 2 Score 3   Altered sleeping 0   Tired, decreased energy 1   Change in appetite 1   Feeling bad or failure about yourself  1   Trouble concentrating 1   Moving slowly or fidgety/restless 0   Suicidal thoughts 0   PHQ-9 Score 7   Difficult doing work/chores Somewhat difficult     Interpretation of Total Score  Total Score Depression Severity:  1-4 = Minimal depression, 5-9 = Mild depression, 10-14 = Moderate  depression, 15-19 = Moderately severe depression, 20-27 = Severe depression   Psychosocial Evaluation and Intervention: Psychosocial Evaluation - 04/08/18 1613      Psychosocial Evaluation & Interventions   Interventions  Encouraged to exercise with the program and follow exercise prescription    Continue Psychosocial Services   No Follow up required  Psychosocial Re-Evaluation: Psychosocial Re-Evaluation    [place] Name 05/13/18 1557 06/03/18 1511 07/01/18 1450 07/28/18 1448       Psychosocial Re-Evaluation   Current issues with  Current Depression  Current Depression  Current Depression  Current Depression    Comments  Patient's initial QOL score was 20.33 and her PHQ-9 score was 7. She says she is depressed but refuses any treatment. She feels she is able to manage it on her own.   Patient's initial QOL score was 20.33 and her PHQ-9 score was 7. She says she feels her depression has improved especially since her appetite is improving.   Patient's initial QOL score was 20.33 and her PHQ-9 score was 7. She says she feels her depression continues to improve especially since her appetite is improving.   Patient's initial QOL score was 20.33 and her PHQ-9 score was 7. She says she feels her depression continues to improve especially since her appetite is improving.     Expected Outcomes  Patient's QOL and PHQ-9 score will improve and she will verbalize improvement in her depression at discharge.   Patient's QOL and PHQ-9 score will improve and she will verbalize improvement in her depression at discharge.   Patient's QOL and PHQ-9 score will improve and she will verbalize improvement in her depression at discharge.   Patient's QOL and PHQ-9 score will improve and she will verbalize improvement in her depression at discharge.     Interventions  Stress management education;Encouraged to attend Pulmonary Rehabilitation for the exercise;Relaxation education  Stress management education;Encouraged to  attend Pulmonary Rehabilitation for the exercise;Relaxation education  Stress management education;Encouraged to attend Pulmonary Rehabilitation for the exercise;Relaxation education  Stress management education;Encouraged to attend Pulmonary Rehabilitation for the exercise;Relaxation education    Continue Psychosocial Services   Follow up required by staff  Follow up required by staff  Follow up required by staff  Follow up required by staff       Psychosocial Discharge (Final Psychosocial Re-Evaluation): Psychosocial Re-Evaluation - 07/28/18 1448      Psychosocial Re-Evaluation   Current issues with  Current Depression    Comments  Patient's initial QOL score was 20.33 and her PHQ-9 score was 7. She says she feels her depression continues to improve especially since her appetite is improving.     Expected Outcomes  Patient's QOL and PHQ-9 score will improve and she will verbalize improvement in her depression at discharge.     Interventions  Stress management education;Encouraged to attend Pulmonary Rehabilitation for the exercise;Relaxation education    Continue Psychosocial Services   Follow up required by staff        Education: Education Goals: Education classes will be provided on a weekly basis, covering required topics. Participant will state understanding/return demonstration of topics presented.  Learning Barriers/Preferences: Learning Barriers/Preferences - 04/08/18 1520      Learning Barriers/Preferences   Learning Barriers  None    Learning Preferences  Skilled Demonstration;Group Instruction;Verbal Instruction       Education Topics: How Lungs Work and Diseases: - Discuss the anatomy of the lungs and diseases that can affect the lungs, such as COPD.   PULMONARY REHAB OTHER RESPIRATORY from 07/15/2018 in [place]  Date  07/01/18  Educator  DWynetta Emery  Instruction Review Code  2- Demonstrated Understanding      Exercise: -Discuss the  importance of exercise, FITT principles of exercise, normal and abnormal responses to exercise, and how to exercise safely.   Environmental Irritants: -  Discuss types of environmental irritants and how to limit exposure to environmental irritants.   PULMONARY REHAB OTHER RESPIRATORY from 07/15/2018 in [place]  Date  07/08/18  Educator  D. Coad  Instruction Review Code  2- Demonstrated Understanding      Meds/Inhalers and oxygen: - Discuss respiratory medications, definition of an inhaler and oxygen, and the proper way to use an inhaler and oxygen.   PULMONARY REHAB OTHER RESPIRATORY from 07/15/2018 in [place]  Date  04/15/18  Educator  D. Wynetta Emery      Energy Saving Techniques: - Discuss methods to conserve energy and decrease shortness of breath when performing activities of daily living.    Bronchial Hygiene / Breathing Techniques: - Discuss breathing mechanics, pursed-lip breathing technique,  proper posture, effective ways to clear airways, and other functional breathing techniques   PULMONARY REHAB OTHER RESPIRATORY from 07/15/2018 in [place]  Date  04/29/18  Educator  Etheleen Mayhew  Instruction Review Code  2- Demonstrated Understanding      Cleaning Equipment: - Provides group verbal and written instruction about the health risks of elevated stress, cause of high stress, and healthy ways to reduce stress.   Nutrition I: Fats: - Discuss the types of cholesterol, what cholesterol does to the body, and how cholesterol levels can be controlled.   Nutrition II: Labels: -Discuss the different components of food labels and how to read food labels.   PULMONARY REHAB OTHER RESPIRATORY from 07/15/2018 in [place]  Date  05/20/18  Educator  DWynetta Emery  Instruction Review Code  2- Demonstrated Understanding      Respiratory Infections: - Discuss the signs and symptoms of  respiratory infections, ways to prevent respiratory infections, and the importance of seeking medical treatment when having a respiratory infection.   PULMONARY REHAB OTHER RESPIRATORY from 07/15/2018 in [place]  Date  06/03/18  Educator  DWynetta Emery  Instruction Review Code  2- Demonstrated Understanding      Stress I: Signs and Symptoms: - Discuss the causes of stress, how stress may lead to anxiety and depression, and ways to limit stress.   PULMONARY REHAB OTHER RESPIRATORY from 07/15/2018 in [place]  Date  06/10/18  Educator  Etheleen Mayhew  Instruction Review Code  2- Demonstrated Understanding      Stress II: Relaxation: -Discuss relaxation techniques to limit stress.   PULMONARY REHAB OTHER RESPIRATORY from 07/15/2018 in [place]  Date  06/17/18  Educator  Etheleen Mayhew  Instruction Review Code  2- Demonstrated Understanding      Oxygen for Home/Travel: - Discuss how to prepare for travel when on oxygen and proper ways to transport and store oxygen to ensure safety.   Knowledge Questionnaire Score: Knowledge Questionnaire Score - 04/08/18 1621      Knowledge Questionnaire Score   Pre Score  12/18       Core Components/Risk Factors/Patient Goals at Admission: Personal Goals and Risk Factors at Admission - 04/08/18 1621      Core Components/Risk Factors/Patient Goals on Admission    Weight Management  Weight Maintenance    Personal Goal Other  Yes    Personal Goal  Gain energy, increase appetite, continue to get stronger through PR program    Intervention  Attend class 2 x week, supplement with 3 x week exercise at home.     Expected Outcomes  Reach personal goals.  Core Components/Risk Factors/Patient Goals Review:  Goals and Risk Factor Review    Row Name 05/13/18 1553 06/03/18 1506 07/01/18 1449 07/28/18 1445       Core Components/Risk Factors/Patient Goals Review   Personal  Goals Review  Improve shortness of breath with ADL's;Other Get energy back; increase appetite.   Improve shortness of breath with ADL's;Other Get energy back; increase appetite.   Improve shortness of breath with ADL's;Other Get energy back; increase appetitie.   Improve shortness of breath with ADL's;Other Get energy back; increase appetite.     Review  Patient has completed 6 sessions losing 4 lbs since her initial visit. Her attendance has been inconsistent due to illness, MD appointments and procedures. She recently has a UTI and was prescribed oxygen therapy. Her progress in the program has been impeded by her inconsistent attendance. Will continue to monitor for progress.   Patient has completed 11 sessions maintaining her weight since last 30 day review; however, she says she has gained 4 lbs on her scales in recent weeks. Her appetite has improved and she is cooking more now. Her gastroenterologist adjusted her medications treating her nausea. She does feel better. She says she does feel stronger and is breathing better since they put her on O2. Will continue to monitor for progress.   Patient has completed 17 sessions maintaining her weight since last 30 day review. She says her appetite continues to improve. She is doing well in the program with progression. She says she feels stronger and has more stamina. She is pleased with her progress in the program. Will continue to monitor for progress.   Patient has completed 21 sessions maintaining her weight since last 30 day review. Her appetite continues to improve. She continues to do well in the program with progression. She continues to say she feels stronger and has more energy. She says she feels less SOB and attributes this to the program and being on oxygen. Will continue to monitor for progress.     Expected Outcomes  Patient will continue to attend sessions and complete the program and meet her personal goals.   Patient will continue to attend  sessions and complete the program and meet her personal goals.   Patient will continue to attend sessions and complete the program and meet her personal goals.   Patient will continue to attend sessions and complete the program and meet her personal goals.        Core Components/Risk Factors/Patient Goals at Discharge (Final Review):  Goals and Risk Factor Review - 07/28/18 1445      Core Components/Risk Factors/Patient Goals Review   Personal Goals Review  Improve shortness of breath with ADL's;Other   Get energy back; increase appetite.    Review  Patient has completed 21 sessions maintaining her weight since last 30 day review. Her appetite continues to improve. She continues to do well in the program with progression. She continues to say she feels stronger and has more energy. She says she feels less SOB and attributes this to the program and being on oxygen. Will continue to monitor for progress.     Expected Outcomes  Patient will continue to attend sessions and complete the program and meet her personal goals.        ITP Comments: ITP Comments    Row Name 04/22/18 1555           ITP Comments  Patient is new to the program. She has completed 3 sessions. She has  been out for this week due to having an endoscopy procedure done on 04/20/18 with 2 biopsy's taken and dilation of esophagus with wide spread inflammation found of her stomach. Also had liver scan with fatty liver. Will have a follow up liver scan in 3 to 6 months. Will continue to monitor for progress.           Comments: ITP REVIEW Pt is making expected progress toward pulmonary rehab goals after completing 21 sessions. Recommend continued exercise, life style modification, education, and utilization of breathing techniques to increase stamina and strength and decrease shortness of breath with exertion.

## 2018-07-29 ENCOUNTER — Encounter (HOSPITAL_COMMUNITY)
Admission: RE | Admit: 2018-07-29 | Discharge: 2018-07-29 | Disposition: A | Discharge status: Home / Self Care | Payer: Medicare HMO | Source: Ambulatory Visit | Attending: Internal Medicine | Admitting: Internal Medicine

## 2018-07-29 DIAGNOSIS — J849 Interstitial pulmonary disease, unspecified: Secondary | ICD-10-CM | POA: Diagnosis not present

## 2018-07-29 DIAGNOSIS — Z79899 Other long term (current) drug therapy: Secondary | ICD-10-CM | POA: Diagnosis not present

## 2018-07-29 DIAGNOSIS — Z7982 Long term (current) use of aspirin: Secondary | ICD-10-CM | POA: Diagnosis not present

## 2018-07-29 DIAGNOSIS — Z87891 Personal history of nicotine dependence: Secondary | ICD-10-CM | POA: Diagnosis not present

## 2018-07-29 NOTE — Progress Notes (Signed)
Daily Session Note  Patient Details  Name: Ashlee Martin MRN: [id number] Date of Birth: [birth year] Referring Provider:     PULMONARY REHAB OTHER RESP ORIENTATION from 04/08/2018 in [place]  Referring Provider  Ramaswamy      Encounter Date: 07/29/2018  Check In: Session Check In - 07/29/18 1330      Check-In   Supervising physician immediately available to respond to emergencies  See telemetry face sheet for immediately available MD    Location  [facility]    Staff Present  Russella Dar, MS, EP, [facility], Exercise Physiologist;Debra Wynetta Emery, RN, [facility], Exercise Physiologist    Medication changes reported      No    Fall or balance concerns reported     No    Tobacco Cessation  No Change    Warm-up and Cool-down  Performed as group-led instruction    Resistance Training Performed  Yes    VAD Patient?  No    PAD/SET Patient?  No      Pain Assessment   Currently in Pain?  No/denies    Pain Score  0-No pain    Multiple Pain Sites  No       Capillary Blood Glucose: No results found for this or any previous visit (from the past 24 hour(s)).    Social History   Tobacco Use  Smoking Status Former Smoker  . Packs/day: 3.00  . Years: 20.00  . Pack years: 60.00  . Types: Cigarettes  . Last attempt to quit: 11/03/1972  . Years since quitting: 45.7  Smokeless Tobacco Never Used    Goals Met:  Proper associated with RPD/PD & O2 Sat Independence with exercise equipment Improved SOB with ADL's Using PLB without cueing & demonstrates good technique Personal goals reviewed No report of cardiac concerns or symptoms Strength training completed today  Goals Unmet:  Not Applicable  Comments: Pt able to follow exercise prescription today without complaint.  Will continue to monitor for progression. Check out 2:30.   Dr. Sinda Du is Medical Director for [facility] Pulmonary Rehab.

## 2018-07-30 DIAGNOSIS — E785 Hyperlipidemia, unspecified: Secondary | ICD-10-CM | POA: Diagnosis not present

## 2018-07-30 DIAGNOSIS — I10 Essential (primary) hypertension: Secondary | ICD-10-CM | POA: Diagnosis not present

## 2018-07-30 DIAGNOSIS — Z299 Encounter for prophylactic measures, unspecified: Secondary | ICD-10-CM | POA: Diagnosis not present

## 2018-07-30 DIAGNOSIS — J8410 Pulmonary fibrosis, unspecified: Secondary | ICD-10-CM | POA: Diagnosis not present

## 2018-07-30 DIAGNOSIS — Z6834 Body mass index (BMI) 34.0-34.9, adult: Secondary | ICD-10-CM | POA: Diagnosis not present

## 2018-08-03 ENCOUNTER — Encounter (HOSPITAL_COMMUNITY)
Admission: RE | Admit: 2018-08-03 | Discharge: 2018-08-03 | Disposition: A | Discharge status: Home / Self Care | Payer: Medicare HMO | Source: Ambulatory Visit | Attending: Internal Medicine | Admitting: Internal Medicine

## 2018-08-03 DIAGNOSIS — J849 Interstitial pulmonary disease, unspecified: Secondary | ICD-10-CM | POA: Diagnosis not present

## 2018-08-03 DIAGNOSIS — Z87891 Personal history of nicotine dependence: Secondary | ICD-10-CM | POA: Diagnosis not present

## 2018-08-03 DIAGNOSIS — Z7982 Long term (current) use of aspirin: Secondary | ICD-10-CM | POA: Diagnosis not present

## 2018-08-03 DIAGNOSIS — Z79899 Other long term (current) drug therapy: Secondary | ICD-10-CM | POA: Diagnosis not present

## 2018-08-03 NOTE — Progress Notes (Signed)
Daily Session Note  Patient Details  Name: Ashlee Martin MRN: [id number] Date of Birth: [birth year] Referring Provider:     PULMONARY REHAB OTHER RESP ORIENTATION from 04/08/2018 in [place]  Referring Provider  Ramaswamy      Encounter Date: 08/03/2018  Check In: Session Check In - 08/03/18 1330      Check-In   Supervising physician immediately available to respond to emergencies  See telemetry face sheet for immediately available MD    Location  [facility]    Staff Present  Russella Dar, MS, EP, [facility], Exercise Physiologist;Helina Hullum Zachery Conch, Exercise Physiologist    Medication changes reported      No    Fall or balance concerns reported     No    Tobacco Cessation  No Change    Warm-up and Cool-down  Performed as group-led instruction    Resistance Training Performed  Yes    VAD Patient?  No    PAD/SET Patient?  No      Pain Assessment   Currently in Pain?  No/denies    Pain Score  0-No pain    Multiple Pain Sites  No       Capillary Blood Glucose: No results found for this or any previous visit (from the past 24 hour(s)).    Social History   Tobacco Use  Smoking Status Former Smoker  . Packs/day: 3.00  . Years: 20.00  . Pack years: 60.00  . Types: Cigarettes  . Last attempt to quit: 11/03/1972  . Years since quitting: 45.7  Smokeless Tobacco Never Used    Goals Met:  Proper associated with RPD/PD & O2 Sat Independence with exercise equipment Using PLB without cueing & demonstrates good technique Exercise tolerated well No report of cardiac concerns or symptoms Strength training completed today  Goals Unmet:  Not Applicable  Comments: Pt able to follow exercise prescription today without complaint.  Will continue to monitor for progression. Check out 1430.   Dr. Sinda Du is Medical Director for [facility] Pulmonary Rehab.

## 2018-08-04 ENCOUNTER — Encounter: Payer: Self-pay | Admitting: *Deleted

## 2018-08-04 ENCOUNTER — Ambulatory Visit (INDEPENDENT_AMBULATORY_CARE_PROVIDER_SITE_OTHER): Payer: Medicare HMO | Admitting: Gastroenterology

## 2018-08-04 ENCOUNTER — Encounter: Payer: Self-pay | Admitting: Gastroenterology

## 2018-08-04 VITALS — BP 135/82 | HR 66 | Temp 96.8°F | Ht 65.0 in | Wt 148.4 lb

## 2018-08-04 DIAGNOSIS — K219 Gastro-esophageal reflux disease without esophagitis: Secondary | ICD-10-CM

## 2018-08-04 DIAGNOSIS — K760 Fatty (change of) liver, not elsewhere classified: Secondary | ICD-10-CM

## 2018-08-04 NOTE — Patient Instructions (Addendum)
Continue Protonix 30 minutes before breakfast.  Let us know if any further nausea, vomiting, or problems swallowing!  We are updating the liver ultrasound.  Have a great Christmas!  We will see you back in 6 months!  I enjoyed seeing you again today! As you know, I value our relationship and want to provide genuine, compassionate, and quality care. I welcome your feedback. If you receive a survey regarding your visit,  I greatly appreciate you taking time to fill this out. See you next time!  Annitta Needs, PhD, ANP-BC [facility]

## 2018-08-04 NOTE — Assessment & Plan Note (Signed)
Updated [place] in Aug 2019 due to hepatomegaly. Findings of fatty liver and area likely fatty sparing but recommending early interval surveillance. Arrange RUQ [place]. Recent LFTs normal.

## 2018-08-04 NOTE — Progress Notes (Signed)
Referring Provider: Glenda Chroman, MD Primary Care Physician:  Glenda Chroman, MD Primary GI: Dr. Oneida Alar   Chief Complaint  Patient presents with  . Diarrhea    comes/goes, had episode few days ago  . Nausea    better    HPI:   Ashlee Martin is an 84 y.o. female presenting today with a history of chronic N/V, solid and liquid dysphagia. EGD/dilation arranged Aug 2019 with benign-appearing, intrinsic mild stenosis, s/p dilation. Mild inflammation entire stomach, s/p biopsy that was negative for H.pylori. Pylorus normal but had changes consistent with PRIOR ulcer disease. Duodenum normal.   Known history of fatty liver, with ultrasound recently in Aug 2019. Area of likely fatty sparing noted but needs surveillance now. Normal LFTs.   Was hospitalized with UTI and respiratory issues in Sept 2019. Now on 2 liters nasal cannula at rest and 3 liters if moving around. Feels better with less shortness of breath. Followed closely by Pulmonology.   N/V resolved. No dysphagia. Enjoying eating.   Past Medical History:  Diagnosis Date  . Arthritis   . Depression   . GERD (gastroesophageal reflux disease)   . Hypercholesterolemia   . Hypertension   . ILD (interstitial lung disease) ([place])   . Psoriasis    had been on Humira but continued to have UTIs, sinus infections, etc. Taken off  . Seizure ([place])    had 3-4 seizures, 8 years ago, placed on medication and has had no seizures since.  . Seizures ([place])     Past Surgical History:  Procedure Laterality Date  . BIOPSY  04/20/2018   Procedure: BIOPSY;  Surgeon: Danie Binder, MD;  [facility];  Service: Endoscopy;;  gastric  . DILATION AND CURETTAGE OF UTERUS     x3  . ESOPHAGOGASTRODUODENOSCOPY (EGD) WITH PROPOFOL N/A 04/20/2018   benign-appearing, intrinsic mild stenosis, s/p dilation. Mild inflammation entire stomach, s/p biopsy that was negative for H.pylori. Pylorus normal but had changes consistent with PRIOR ulcer  disease. Duodenum normal.   . SAVORY DILATION N/A 04/20/2018   Procedure: SAVORY DILATION;  Surgeon: Danie Binder, MD;  Location: [facility];  Service: Endoscopy;  Laterality: N/A;  . VIDEO BRONCHOSCOPY Bilateral 01/07/2018   Procedure: VIDEO BRONCHOSCOPY WITHOUT FLUORO;  Surgeon: Brand Males, MD;  Location: [facility];  Service: Cardiopulmonary;  Laterality: Bilateral;  . WRIST SURGERY Right    ORIF    Current Outpatient Medications  Medication Sig Dispense Refill  . atenolol (TENORMIN) 25 MG tablet Take 25 mg by mouth 2 (two) times daily.     . benzonatate (TESSALON) 200 MG capsule Take 1 capsule (200 mg total) by mouth 3 (three) times daily as needed for cough. 30 capsule 1  . dextromethorphan-guaiFENesin (MUCINEX DM) 30-600 MG 12hr tablet Take 1 tablet by mouth See admin instructions. Take 1 tablet daily in the morning, may repeat dose in the evening if needed for congestion.    Marland Kitchen doxylamine, Sleep, (UNISOM) 25 MG tablet Take 25 mg by mouth at bedtime.     Marland Kitchen Histamine Dihydrochloride (AUSTRALIAN DREAM ARTHRITIS EX) Apply 1 application topically 3 (three) times daily as needed (for pain.).    Marland Kitchen Nintedanib (OFEV) 150 MG CAPS Take 150 mg by mouth 2 (two) times daily.    . pantoprazole (PROTONIX) 40 MG tablet Take 1 tablet (40 mg total) by mouth daily. 30 minutes before breakfast 90 tablet 3  . Polyethyl Glycol-Propyl Glycol (SYSTANE OP) Place 1 drop into  both eyes 3 (three) times daily as needed (dry/irritated eyes.).     Marland Kitchen rOPINIRole (REQUIP) 2 MG tablet Take 2 mg by mouth at bedtime.     . simvastatin (ZOCOR) 20 MG tablet Take 20 mg by mouth at bedtime.     . VENTOLIN HFA 108 (90 Base) MCG/ACT inhaler Inhale 1-2 puffs into the lungs every 6 (six) hours as needed (chest congestion/cough).   0  . vitamin B-12 (CYANOCOBALAMIN) 1000 MCG tablet Take 1,000 mcg by mouth at bedtime.      No current facility-administered medications for this visit.     Allergies as of 08/04/2018 -  Review Complete 08/04/2018  Allergen Reaction Noted  . Humira [adalimumab] Other (See Comments) 11/03/2017  . Pirfenidone Other (See Comments) 04/27/2018    Family History  Problem Relation Age of Onset  . Heart disease Mother   . Heart disease Father   . Cancer Sister        GYN  . Colon cancer Neg Hx     Social History   Socioeconomic History  . Marital status: Married    Spouse name: Not on file  . Number of children: Not on file  . Years of education: Not on file  . Highest education level: Not on file  Occupational History  . Occupation: Retired  Scientific laboratory technician  . Financial resource strain: Not on file  . Food insecurity:    Worry: Not on file    Inability: Not on file  . Transportation needs:    Medical: Not on file    Non-medical: Not on file  Tobacco Use  . Smoking status: Former Smoker    Packs/day: 3.00    Years: 20.00    Pack years: 60.00    Types: Cigarettes    Last attempt to quit: 11/03/1972    Years since quitting: 45.7  . Smokeless tobacco: Never Used  Substance and Sexual Activity  . Alcohol use: Yes    Alcohol/week: 7.0 standard drinks    Types: 7 Shots of liquor per week    Comment: May have before bed  . Drug use: Never  . Sexual activity: Yes    Birth control/protection: None  Lifestyle  . Physical activity:    Days per week: Not on file    Minutes per session: Not on file  . Stress: Not on file  Relationships  . Social connections:    Talks on phone: Not on file    Gets together: Not on file    Attends religious service: Not on file    Active member of club or organization: Not on file    Attends meetings of clubs or organizations: Not on file    Relationship status: Not on file  Other Topics Concern  . Not on file  Social History Narrative  . Not on file    Review of Systems: Gen: Denies fever, chills, anorexia. Denies fatigue, weakness, weight loss.  CV: Denies chest pain, palpitations, syncope, peripheral edema, and  claudication. Resp: see HPI  GI: see HPI  Derm: Denies rash, itching, dry skin Psych: Denies depression, anxiety, memory loss, confusion. No homicidal or suicidal ideation.  Heme: Denies bruising, bleeding, and enlarged lymph nodes.  Physical Exam: BP 135/82   Pulse 66   Temp (!) 96.8 F (36 C) (Oral)   Ht _0  (1.651 m)   Wt 148 lb 6.4 oz (67.3 kg)   BMI 24.70 kg/m  General:   Alert and oriented. No distress noted.  Pleasant and cooperative. 2liters nasal cannula  Head:  Normocephalic and atraumatic. Eyes:  Conjuctiva clear without scleral icterus. Mouth:  Oral mucosa pink and moist.  Abdomen:  +BS, soft, non-tender and non-distended. No rebound or guarding. No HSM or masses noted. Msk:  Symmetrical without gross deformities. Normal posture. Extremities:  Without edema. Neurologic:  Alert and  oriented x4 Psych:  Alert and cooperative. Normal mood and affect.

## 2018-08-04 NOTE — Progress Notes (Signed)
_0  ID: Ashlee Martin, female    DOB: [birth year], 84 y.o.   MRN: [id number]  Chief Complaint  Patient presents with  . Follow-up    OFEV follow up     Referring provider: Glenda Chroman, MD  HPI:  84 year old female former smoker followed in our office for idiopathic pulmonary fibrosis (failed Esbriet, currently on 0FEV)  Smoker/ Smoking History: Former smoker.  60-pack-year smoking history Maintenance: OFEV Pt of: Dr. Chase Caller  Recent [facility] Encounters:   Last seen in our office on 06/24/2018-office visit-Ramaswamy >>>Patient started on 0FEV  08/05/2018  - Visit   84 year old female patient completing follow-up with our office today. Pt is currently on OFEV and patient is doing well.  Patient attributes her success on the medication to to the nursing education from the pharmaceutical company.  Patient reports she has had occasional diarrhea but this is been managed well with Lomotil.  Patient feels much better on this medication than Esbriet.  Patient denies drops in oxygen and feels that she is able to do more things around the house.  Patient thinks the medication is going well.  Patient is interested in traveling to [place] as she usually does this every year.  Patient is wondering how to coordinate this with her oxygen needs now.  Patient is wondering if she could switch her DME company from [facility] to [employer].   Records are showing that patient is due for pneumonia vaccine.  Patient believes she has received this at [facility] she will check her [facility] as well as at [facility] in bring to next office visit.   Tests:  06/15/18 -CT chest high-res- previously noted left Majchrzak lobe nodules completely resolved, chronic changes of interstitial lung disease again considered probable UIP  FENO:  No results found for: NITRICOXIDE  PFT: PFT Results Latest Ref Rng & Units 06/16/2018 12/29/2017  FVC-Pre L 1.54 1.49  FVC-Predicted Pre % 60 58  Pre FEV1/FVC %  % 83 79  FEV1-Pre L 1.27 1.18  FEV1-Predicted Pre % 66 62  DLCO UNC% % 23 30  DLCO COR %Predicted % 40 53    Imaging: No results found.  Chart Review:    Specialty Problems      Pulmonary Problems   Allergic rhinitis   ILD (interstitial lung disease) ([place])   Acute respiratory failure with hypoxia ([facility])      Allergies  Allergen Reactions  . Humira [Adalimumab] Other (See Comments)    Worsened your psoriasis.  . Pirfenidone Other (See Comments)    Fatigue and weight loss    Immunization History  Administered Date(s) Administered  . Influenza, High Dose Seasonal PF 06/10/2017, 04/27/2018  . Pneumococcal Conjugate-13 12/09/2014   Patient believes she [facility] she is unsure when, patient to check personal records as well as the contact [facility] and will bring records to next office visit  Past Medical History:  Diagnosis Date  . Arthritis   . Depression   . GERD (gastroesophageal reflux disease)   . Hypercholesterolemia   . Hypertension   . ILD (interstitial lung disease) ([place])   . Psoriasis    had been on Humira but continued to have UTIs, sinus infections, etc. Taken off  . Seizure ([place])    had 3-4 seizures, 8 years ago, placed on medication and has had no seizures since.  . Seizures ([place])     Tobacco History: Social History   Tobacco Use  Smoking Status Former Smoker  .  Packs/day: 3.00  . Years: 20.00  . Pack years: 60.00  . Types: Cigarettes  . Last attempt to quit: 11/03/1972  . Years since quitting: 45.7  Smokeless Tobacco Never Used   Counseling given: Yes  Continue to not smoke  Outpatient Encounter Medications as of 08/05/2018  Medication Sig  . atenolol (TENORMIN) 25 MG tablet Take 25 mg by mouth 2 (two) times daily.   Marland Kitchen dextromethorphan-guaiFENesin (MUCINEX DM) 30-600 MG 12hr tablet Take 1 tablet by mouth See admin instructions. Take 1 tablet daily in the morning, may repeat dose in the evening if needed for  congestion.  Marland Kitchen doxylamine, Sleep, (UNISOM) 25 MG tablet Take 25 mg by mouth at bedtime.   Marland Kitchen Histamine Dihydrochloride (AUSTRALIAN DREAM ARTHRITIS EX) Apply 1 application topically 3 (three) times daily as needed (for pain.).  Marland Kitchen Nintedanib (OFEV) 150 MG CAPS Take 150 mg by mouth 2 (two) times daily.  . pantoprazole (PROTONIX) 40 MG tablet Take 1 tablet (40 mg total) by mouth daily. 30 minutes before breakfast  . Polyethyl Glycol-Propyl Glycol (SYSTANE OP) Place 1 drop into both eyes 3 (three) times daily as needed (dry/irritated eyes.).   Marland Kitchen rOPINIRole (REQUIP) 2 MG tablet Take 2 mg by mouth at bedtime.   . simvastatin (ZOCOR) 20 MG tablet Take 20 mg by mouth at bedtime.   . VENTOLIN HFA 108 (90 Base) MCG/ACT inhaler Inhale 1-2 puffs into the lungs every 6 (six) hours as needed (chest congestion/cough).   . vitamin B-12 (CYANOCOBALAMIN) 1000 MCG tablet Take 1,000 mcg by mouth at bedtime.   . benzonatate (TESSALON) 200 MG capsule Take 1 capsule (200 mg total) by mouth 3 (three) times daily as needed for cough. (Patient not taking: Reported on 08/05/2018)   No facility-administered encounter medications on file as of 08/05/2018.     Review of Systems  Review of Systems  Constitutional: Positive for fatigue. Negative for chills, fever and unexpected weight change.  HENT: Positive for postnasal drip. Negative for congestion and ear pain.   Respiratory: Positive for cough (dry cough in the am sometimes). Negative for chest tightness, shortness of breath and wheezing.   Cardiovascular: Negative for chest pain and palpitations.  Gastrointestinal: Positive for diarrhea (managed with lomitil ). Negative for blood in stool, nausea and vomiting.  Musculoskeletal: Negative for arthralgias.  Skin: Negative for color change.  Allergic/Immunologic: Negative for environmental allergies and food allergies.  Neurological: Negative for dizziness, light-headedness and headaches.  Psychiatric/Behavioral:  Negative for dysphoric mood. The patient is not nervous/anxious.   All other systems reviewed and are negative.    Physical Exam  BP 132/62 (BP Location: Left Arm, Cuff Size: Normal)   Pulse 69   Ht _0  (1.651 m)   Wt 149 lb 14.4 oz (68 kg)   SpO2 92%   BMI 24.94 kg/m   Wt Readings from Last 5 Encounters:  08/05/18 149 lb 14.4 oz (68 kg)  08/04/18 148 lb 6.4 oz (67.3 kg)  06/24/18 149 lb 6.4 oz (67.8 kg)  05/26/18 144 lb (65.3 kg)  04/27/18 148 lb 6.4 oz (67.3 kg)   2L via [place]  Physical Exam  Constitutional: She is oriented to person, place, and time and well-developed, well-nourished, and in no distress. No distress.  HENT:  Head: Normocephalic and atraumatic.  Right Ear: Hearing and external ear normal.  Left Ear: Hearing and external ear normal.  Nose: Mucosal edema and rhinorrhea present. Right sinus exhibits no maxillary sinus tenderness and no frontal sinus tenderness.  Left sinus exhibits no maxillary sinus tenderness and no frontal sinus tenderness.  Mouth/Throat: Uvula is midline and oropharynx is clear and moist. No oropharyngeal exudate.  +both ears occluded with cerumen   Eyes: Pupils are equal, round, and reactive to light.  Neck: Normal range of motion. Neck supple.  Cardiovascular: Normal rate, regular rhythm and normal heart sounds.  Pulmonary/Chest: Effort normal. No accessory muscle usage. No respiratory distress. She has no decreased breath sounds. She has no wheezes. She has no rhonchi. She has rales (BB rales (R > L)).  Abdominal: Soft. Bowel sounds are normal.  Musculoskeletal: Normal range of motion.        General: No edema.  Lymphadenopathy:    She has no cervical adenopathy.  Neurological: She is alert and oriented to person, place, and time. Gait normal.  Skin: Skin is warm and dry. She is not diaphoretic. No erythema.  Psychiatric: Mood, memory, affect and judgment normal.  Nursing note and vitals reviewed.     Lab Results:  CBC      Component Value Date/Time   WBC 9.0 04/27/2018 1506   RBC 3.73 (L) 04/27/2018 1506   HGB 13.6 04/27/2018 1506   HCT 40.2 04/27/2018 1506   PLT 345.0 04/27/2018 1506   MCV 107.8 (H) 04/27/2018 1506   MCH 36.4 (H) 04/19/2018 1014   MCHC 33.8 04/27/2018 1506   RDW 14.0 04/27/2018 1506   LYMPHSABS 2.2 04/27/2018 1506   MONOABS 1.2 (H) 04/27/2018 1506   EOSABS 0.5 04/27/2018 1506   BASOSABS 0.1 04/27/2018 1506    BMET    Component Value Date/Time   NA 136 04/27/2018 1506   K 3.2 (L) 04/27/2018 1506   CL 103 04/27/2018 1506   CO2 21 04/27/2018 1506   GLUCOSE 113 (H) 04/27/2018 1506   BUN 9 04/27/2018 1506   CREATININE 1.12 04/27/2018 1506   CALCIUM 9.3 04/27/2018 1506   GFRNONAA 41 (L) 04/19/2018 1014   GFRAA 48 (L) 04/19/2018 1014    BNP No results found for: BNP  ProBNP No results found for: PROBNP    Assessment & Plan:   84 year old patient complaining follow-up with her office today.  Patient doing quite well on 0FEV.  We will keep patient on 0FEV at this time.  We will do baseline blood work today to check for liver function as patient is on 0FEV.  Will bring patient back to see Dr. Chase Caller in 6 weeks in ILD clinic.  Patient can continue to use Lomotil to help manage diarrhea that occasionally happens.  ILD (interstitial lung disease) ([place]) Continue OFEV as prescribed   Labwork today  >>>LFTs   Follow up with Dr. Chase Caller in ILD clinic in 6-8 weeks   Continue oxygen therapy as prescribed   Therapeutic drug monitoring  Labwork today  >>>LFTs    Acute respiratory failure with hypoxia ([place]) Continue oxygen therapy as prescribed Continue to monitor oxygen saturations Contact our office if you are unable to maintain oxygen saturations greater than 90% consistently  Follow-up with [facility] your DME company regarding your concerns about traveling and seeing what rental options are available for you   This appointment was 27 minutes along with  over 50% the nondrinker face-to-face patient care, assessment, plan of care follow-up and discussion  Lauraine Rinne, NP 08/05/2018

## 2018-08-04 NOTE — Assessment & Plan Note (Signed)
84 year old female with history of N/V, dysphagia secondary to uncontrolled GERD and peptic stricture s/p dilation. Now doing well and continues on Protonix 40 mg daily. Return in 6 months or sooner if needed.

## 2018-08-05 ENCOUNTER — Encounter (HOSPITAL_COMMUNITY): Payer: Medicare HMO

## 2018-08-05 ENCOUNTER — Ambulatory Visit (INDEPENDENT_AMBULATORY_CARE_PROVIDER_SITE_OTHER): Payer: Medicare HMO | Admitting: Pulmonary Disease

## 2018-08-05 ENCOUNTER — Encounter: Payer: Self-pay | Admitting: Pulmonary Disease

## 2018-08-05 VITALS — BP 132/62 | HR 69 | Ht 65.0 in | Wt 149.9 lb

## 2018-08-05 DIAGNOSIS — J84112 Idiopathic pulmonary fibrosis: Secondary | ICD-10-CM

## 2018-08-05 DIAGNOSIS — Z5181 Encounter for therapeutic drug level monitoring: Secondary | ICD-10-CM

## 2018-08-05 DIAGNOSIS — J849 Interstitial pulmonary disease, unspecified: Secondary | ICD-10-CM | POA: Diagnosis not present

## 2018-08-05 DIAGNOSIS — J9601 Acute respiratory failure with hypoxia: Secondary | ICD-10-CM | POA: Insufficient documentation

## 2018-08-05 LAB — HEPATIC FUNCTION PANEL
ALK PHOS: 90 U/L (ref 39–117)
ALT: 16 U/L (ref 0–35)
AST: 27 U/L (ref 0–37)
Albumin: 3.5 g/dL (ref 3.5–5.2)
Bilirubin, Direct: 0.1 mg/dL (ref 0.0–0.3)
TOTAL PROTEIN: 7.3 g/dL (ref 6.0–8.3)
Total Bilirubin: 0.5 mg/dL (ref 0.2–1.2)

## 2018-08-05 LAB — COMPREHENSIVE METABOLIC PANEL
ALBUMIN: 3.5 g/dL (ref 3.5–5.2)
ALT: 16 U/L (ref 0–35)
AST: 27 U/L (ref 0–37)
Alkaline Phosphatase: 90 U/L (ref 39–117)
BUN: 11 mg/dL (ref 6–23)
CALCIUM: 8.9 mg/dL (ref 8.4–10.5)
CHLORIDE: 104 meq/L (ref 96–112)
CO2: 29 meq/L (ref 19–32)
Creatinine, Ser: 0.87 mg/dL (ref 0.40–1.20)
GFR: 65.78 mL/min (ref >60.00)
GLUCOSE: 111 mg/dL — AB (ref 70–99)
POTASSIUM: 4.2 meq/L (ref 3.5–5.1)
Sodium: 140 mEq/L (ref 135–145)
TOTAL PROTEIN: 7.3 g/dL (ref 6.0–8.3)
Total Bilirubin: 0.5 mg/dL (ref 0.2–1.2)

## 2018-08-05 NOTE — Patient Instructions (Addendum)
Continue OFEV as prescribed   Labwork today  >>>LFTs   Follow up with Dr. Chase Caller in ILD clinic in 6-8 weeks   Continue oxygen therapy as prescribed  Follow-up with primary care as he needs your ears flushed  It is flu season:   >>>Remember to be washing your hands regularly, using hand sanitizer, be careful to use around herself with has contact with people who are sick will increase her chances of getting sick yourself. >>> Best ways to protect herself from the flu: Receive the yearly flu vaccine, practice good hand hygiene washing with soap and also using hand sanitizer when available, eat a nutritious meals, get adequate rest, hydrate appropriately   Please contact the office if your symptoms worsen or you have concerns that you are not improving.   Thank you for choosing [facility] for your healthcare, and for allowing [place] to partner with you on your healthcare journey. I am thankful to be able to provide care to you today.   Wyn Quaker FNP-C

## 2018-08-05 NOTE — Progress Notes (Signed)
Lab work looks great.  Continue with OFEV no changes to plan of care.  Wyn Quaker FNP

## 2018-08-05 NOTE — Progress Notes (Signed)
CC'D TO PCP

## 2018-08-05 NOTE — Assessment & Plan Note (Signed)
Continue oxygen therapy as prescribed Continue to monitor oxygen saturations Contact our office if you are unable to maintain oxygen saturations greater than 90% consistently  Follow-up with [facility] your DME company regarding your concerns about traveling and seeing what rental options are available for you

## 2018-08-05 NOTE — Assessment & Plan Note (Signed)
  Labwork today  >>>LFTs

## 2018-08-05 NOTE — Assessment & Plan Note (Signed)
Continue OFEV as prescribed   Labwork today  >>>LFTs   Follow up with Dr. Chase Caller in ILD clinic in 6-8 weeks   Continue oxygen therapy as prescribed

## 2018-08-06 DIAGNOSIS — J849 Interstitial pulmonary disease, unspecified: Secondary | ICD-10-CM | POA: Diagnosis not present

## 2018-08-06 DIAGNOSIS — R0902 Hypoxemia: Secondary | ICD-10-CM | POA: Diagnosis not present

## 2018-08-06 DIAGNOSIS — Z96649 Presence of unspecified artificial hip joint: Secondary | ICD-10-CM | POA: Diagnosis not present

## 2018-08-06 DIAGNOSIS — M1611 Unilateral primary osteoarthritis, right hip: Secondary | ICD-10-CM | POA: Diagnosis not present

## 2018-08-10 ENCOUNTER — Encounter (HOSPITAL_COMMUNITY): Payer: Medicare HMO

## 2018-08-12 ENCOUNTER — Encounter (HOSPITAL_COMMUNITY): Payer: Medicare HMO

## 2018-08-13 ENCOUNTER — Ambulatory Visit (HOSPITAL_COMMUNITY): Payer: Medicare HMO

## 2018-08-13 NOTE — Progress Notes (Signed)
Pulmonary Individual Treatment Plan  Patient Details  Name: Ashlee Martin MRN: [id number] Date of Birth: 1931-07-22 Referring Provider:     PULMONARY REHAB OTHER RESP ORIENTATION from 04/08/2018 in [facility]  Referring Provider  [place]      Initial Encounter Date:    PULMONARY REHAB OTHER RESP ORIENTATION from 04/08/2018 in [facility]  Date  04/08/18      Visit Diagnosis: ILD (interstitial lung disease) ([place])  Patient's Home Medications on Admission:   Current Outpatient Medications:  .  atenolol (TENORMIN) 25 MG tablet, Take 25 mg by mouth 2 (two) times daily. , Disp: , Rfl:  .  benzonatate (TESSALON) 200 MG capsule, Take 1 capsule (200 mg total) by mouth 3 (three) times daily as needed for cough. (Patient not taking: Reported on 08/05/2018), Disp: 30 capsule, Rfl: 1 .  dextromethorphan-guaiFENesin (MUCINEX DM) 30-600 MG 12hr tablet, Take 1 tablet by mouth See admin instructions. Take 1 tablet daily in the morning, may repeat dose in the evening if needed for congestion., Disp: , Rfl:  .  doxylamine, Sleep, (UNISOM) 25 MG tablet, Take 25 mg by mouth at bedtime. , Disp: , Rfl:  .  Histamine Dihydrochloride (AUSTRALIAN DREAM ARTHRITIS EX), Apply 1 application topically 3 (three) times daily as needed (for pain.)., Disp: , Rfl:  .  Nintedanib (OFEV) 150 MG CAPS, Take 150 mg by mouth 2 (two) times daily., Disp: , Rfl:  .  pantoprazole (PROTONIX) 40 MG tablet, Take 1 tablet (40 mg total) by mouth daily. 30 minutes before breakfast, Disp: 90 tablet, Rfl: 3 .  Polyethyl Glycol-Propyl Glycol (SYSTANE OP), Place 1 drop into both eyes 3 (three) times daily as needed (dry/irritated eyes.). , Disp: , Rfl:  .  rOPINIRole (REQUIP) 2 MG tablet, Take 2 mg by mouth at bedtime. , Disp: , Rfl:  .  simvastatin (ZOCOR) 20 MG tablet, Take 20 mg by mouth at bedtime. , Disp: , Rfl:  .  VENTOLIN HFA 108 (90 Base) MCG/ACT inhaler, Inhale 1-2 puffs into the  lungs every 6 (six) hours as needed (chest congestion/cough). , Disp: , Rfl: 0 .  vitamin B-12 (CYANOCOBALAMIN) 1000 MCG tablet, Take 1,000 mcg by mouth at bedtime. , Disp: , Rfl:   Past Medical History: Past Medical History:  Diagnosis Date  . Arthritis   . Depression   . GERD (gastroesophageal reflux disease)   . Hypercholesterolemia   . Hypertension   . ILD (interstitial lung disease) ([place])   . Psoriasis    had been on Humira but continued to have UTIs, sinus infections, etc. Taken off  . Seizure ([place])    had 3-4 seizures, 8 years ago, placed on medication and has had no seizures since.  . Seizures ([place])     Tobacco Use: Social History   Tobacco Use  Smoking Status Former Smoker  . Packs/day: 3.00  . Years: 20.00  . Pack years: 60.00  . Types: Cigarettes  . Last attempt to quit: 11/03/1972  . Years since quitting: 45.8  Smokeless Tobacco Never Used    Labs: Recent Review Flowsheet Data    There is no flowsheet data to display.      Capillary Blood Glucose: No results found for: GLUCAP   Pulmonary Assessment Scores: Pulmonary Assessment Scores    Row Name 04/08/18 1617         ADL UCSD   ADL Phase  Entry     SOB Score total  41  Rest  0     Walk  8     Stairs  1     Bath  1     Dress  1     Shop  3       CAT Score   CAT Score  16       mMRC Score   mMRC Score  3        Pulmonary Function Assessment: Pulmonary Function Assessment - 04/08/18 1616      Pulmonary Function Tests   FVC%  58 %    FEV1%  62 %    FEV1/FVC Ratio  108    RV%  35 %    DLCO%  30 %      Initial Spirometry Results   FVC%  58 %    FEV1%  62 %    FEV1/FVC Ratio  108      Breath   Bilateral Breath Sounds  Clear    Shortness of Breath  Yes   Mostly during walk test.       Exercise Target Goals: Exercise Program Goal: Individual exercise prescription set using results from initial 6 min walk test and THRR while considering  patient's activity barriers and  safety.   Exercise Prescription Goal: Initial exercise prescription builds to 30-45 minutes a day of aerobic activity, 2-3 days per week.  Home exercise guidelines will be given to patient during program as part of exercise prescription that the participant will acknowledge.  Activity Barriers & Risk Stratification:   6 Minute Walk: 6 Minute Walk    Row Name 04/08/18 1537         6 Minute Walk   Phase  Initial     Distance  800 feet     Walk Time  6 minutes     # of Rest Breaks  1     MPH  1.51     METS  2.16     RPE  13     Perceived Dyspnea   13     Resting HR  68 bpm     Resting BP  110/62     Resting Oxygen Saturation   91 %     Exercise Oxygen Saturation  during 6 min walk  88 %     Max Ex. HR  93 bpm     Max Ex. BP  130/64     2 Minute Post BP  124/62        Oxygen Initial Assessment: Oxygen Initial Assessment - 04/08/18 1615      Home Oxygen   Home Oxygen Device  None    Sleep Oxygen Prescription  None    Home Exercise Oxygen Prescription  None    Home at Rest Exercise Oxygen Prescription  None    Compliance with Home Oxygen Use  --   N/A     Initial 6 min Walk   Oxygen Used  None      Program Oxygen Prescription   Program Oxygen Prescription  None       Oxygen Re-Evaluation: Oxygen Re-Evaluation    Row Name 05/13/18 1549 06/03/18 1503 07/01/18 1447 07/28/18 1444 08/13/18 0739     Program Oxygen Prescription   Program Oxygen Prescription  Continuous  Continuous  Continuous  Continuous  Continuous   Liters per minute  4  4 Exercise O2 at 3 to 4 L/M.  _0 FiO2%  92  90  93  95  94     Home Oxygen   Home Oxygen Device  Portable Musician   Sleep Oxygen Prescription  Continuous  Continuous  Continuous  Continuous  Continuous   Liters per minute  _0 Home Exercise Oxygen Prescription  Continuous  Continuous  Continuous  Continuous   Continuous   Liters per minute  _1 Home at Rest Exercise Oxygen Prescription  Continuous  Continuous  Continuous  -  Continuous   Liters per minute  _2 Compliance with Home Oxygen Use  Yes  Yes  Yes  Yes  Yes     Goals/Expected Outcomes   Short Term Goals  To learn and understand importance of monitoring SPO2 with pulse oximeter and demonstrate accurate use of the pulse oximeter.;To learn and exhibit compliance with exercise, home and travel O2 prescription;To learn and understand importance of maintaining oxygen saturations>88%;To learn and demonstrate proper pursed lip breathing techniques or other breathing techniques.  To learn and understand importance of monitoring SPO2 with pulse oximeter and demonstrate accurate use of the pulse oximeter.;To learn and exhibit compliance with exercise, home and travel O2 prescription;To learn and understand importance of maintaining oxygen saturations>88%;To learn and demonstrate proper pursed lip breathing techniques or other breathing techniques.  To learn and understand importance of monitoring SPO2 with pulse oximeter and demonstrate accurate use of the pulse oximeter.;To learn and exhibit compliance with exercise, home and travel O2 prescription;To learn and understand importance of maintaining oxygen saturations>88%;To learn and demonstrate proper pursed lip breathing techniques or other breathing techniques.  To learn and understand importance of monitoring SPO2 with pulse oximeter and demonstrate accurate use of the pulse oximeter.;To learn and exhibit compliance with exercise, home and travel O2 prescription;To learn and understand importance of maintaining oxygen saturations>88%;To learn and demonstrate proper pursed lip breathing techniques or other breathing techniques.  To learn and understand importance of monitoring SPO2 with pulse oximeter and demonstrate accurate use of the pulse oximeter.;To learn and exhibit compliance  with exercise, home and travel O2 prescription;To learn and understand importance of maintaining oxygen saturations>88%;To learn and demonstrate proper pursed lip breathing techniques or other breathing techniques.   Long  Term Goals  Exhibits compliance with exercise, home and travel O2 prescription;Verbalizes importance of monitoring SPO2 with pulse oximeter and return demonstration;Maintenance of O2 saturations>88%;Exhibits proper breathing techniques, such as pursed lip breathing or other method taught during program session  Exhibits compliance with exercise, home and travel O2 prescription;Verbalizes importance of monitoring SPO2 with pulse oximeter and return demonstration;Maintenance of O2 saturations>88%;Exhibits proper breathing techniques, such as pursed lip breathing or other method taught during program session  Exhibits compliance with exercise, home and travel O2 prescription;Verbalizes importance of monitoring SPO2 with pulse oximeter and return demonstration;Maintenance of O2 saturations>88%;Exhibits proper breathing techniques, such as pursed lip breathing or other method taught during program session  Exhibits compliance with exercise, home and travel O2 prescription;Verbalizes importance of monitoring SPO2 with pulse oximeter and return demonstration;Maintenance of O2 saturations>88%;Exhibits proper breathing techniques, such as pursed lip breathing or other method taught during program session  Exhibits compliance with exercise, home and travel O2 prescription;Verbalizes importance of monitoring SPO2 with pulse oximeter and return demonstration;Maintenance of O2 saturations>88%;Exhibits proper breathing techniques, such as pursed lip breathing or other method  taught during program session   Comments  Patient was recently prescribed O2 at home. Not sure if she is supposed to use continuously or as needed. Will clarify this at her next visit. Patient demonstrates proper pursed lip  breathing technique and proper usage of Pulse Oximetry and verbalizes understanding of maintaining her O2 Sat >88%. Will conitnue to monitor.   Patient was recently prescribed O2 at home. Not sure if she is supposed to use continuously or as needed. Will clarify this at her next visit. Patient demonstrates proper pursed lip breathing technique and proper usage of Pulse Oximetry and verbalizes understanding of maintaining her O2 Sat >88%. Will conitnue to monitor.   Patient was recently prescribed O2 at home. Not sure if she is supposed to use continuously or as needed. Will clarify this at her next visit. Patient demonstrates proper pursed lip breathing technique and proper usage of Pulse Oximetry and verbalizes understanding of maintaining her O2 Sat >88%. Will conitnue to monitor.   Patient was recently prescribed O2 at home. Not sure if she is supposed to use continuously or as needed. Will clarify this at her next visit. Patient demonstrates proper pursed lip breathing technique and proper usage of Pulse Oximetry and verbalizes understanding of maintaining her O2 Sat >88%. Will conitnue to monitor.   Patient was recently prescribed O2 at home. Not sure if she is supposed to use continuously or as needed. Will clarify this at her next visit. Patient demonstrates proper pursed lip breathing technique and proper usage of Pulse Oximetry and verbalizes understanding of maintaining her O2 Sat >88%. Will conitnue to monitor.    Goals/Expected Outcomes  Patient will continue to meet her short and long term goals.   Patient will continue to meet her short and long term goals.   Patient will continue to meet her short and long term goals.   Patient will continue to meet her short and long term goals.   Patient will continue to meet her short and long term goals.       Oxygen Discharge (Final Oxygen Re-Evaluation): Oxygen Re-Evaluation - 08/13/18 0739      Program Oxygen Prescription   Program Oxygen Prescription   Continuous    Liters per minute  3    FiO2%  94      Home Oxygen   Home Oxygen Device  Portable Concentrator    Sleep Oxygen Prescription  Continuous    Liters per minute  2    Home Exercise Oxygen Prescription  Continuous    Liters per minute  3    Home at Rest Exercise Oxygen Prescription  Continuous    Liters per minute  2    Compliance with Home Oxygen Use  Yes      Goals/Expected Outcomes   Short Term Goals  To learn and understand importance of monitoring SPO2 with pulse oximeter and demonstrate accurate use of the pulse oximeter.;To learn and exhibit compliance with exercise, home and travel O2 prescription;To learn and understand importance of maintaining oxygen saturations>88%;To learn and demonstrate proper pursed lip breathing techniques or other breathing techniques.    Long  Term Goals  Exhibits compliance with exercise, home and travel O2 prescription;Verbalizes importance of monitoring SPO2 with pulse oximeter and return demonstration;Maintenance of O2 saturations>88%;Exhibits proper breathing techniques, such as pursed lip breathing or other method taught during program session    Comments  Patient was recently prescribed O2 at home. Not sure if she is supposed to use continuously or as  needed. Will clarify this at her next visit. Patient demonstrates proper pursed lip breathing technique and proper usage of Pulse Oximetry and verbalizes understanding of maintaining her O2 Sat >88%. Will conitnue to monitor.     Goals/Expected Outcomes  Patient will continue to meet her short and long term goals.        Initial Exercise Prescription: Initial Exercise Prescription - 04/08/18 1500      Date of Initial Exercise RX and Referring Provider   Date  04/08/18    Referring Provider  Ramaswamy    Expected Discharge Date  07/24/18      Treadmill   MPH  0.8    Grade  0    Minutes  17    METs  1.6      NuStep   Level  1    SPM  42    Minutes  22    METs  1.7       Prescription Details   Frequency (times per week)  2    Duration  Progress to 30 minutes of continuous aerobic without signs/symptoms of physical distress      Intensity   THRR 40-80% of Max Heartrate  102-113-125    Ratings of Perceived Exertion  11-13    Perceived Dyspnea  0-4      Progression   Progression  Continue progressive overload as per policy without signs/symptoms or physical distress.      Resistance Training   Training Prescription  Yes    Weight  1    Reps  10-15       Perform Capillary Blood Glucose checks as needed.  Exercise Prescription Changes:  Exercise Prescription Changes    Row Name 04/22/18 0700 04/30/18 1400 05/27/18 0800 06/15/18 1400 07/01/18 1500     Response to Exercise   Blood Pressure (Admit)  132/60  102/60  102/62  144/66  120/60   Blood Pressure (Exercise)  152/68  132/68  126/64  144/66  134/60   Blood Pressure (Exit)  136/60  110/66  106/64  118/64  116/60   Heart Rate (Admit)  68 bpm  77 bpm  96 bpm  71 bpm  75 bpm   Heart Rate (Exercise)  96 bpm  79 bpm  113 bpm  84 bpm  87 bpm   Heart Rate (Exit)  76 bpm  79 bpm  103 bpm  77 bpm  97 bpm   Oxygen Saturation (Admit)  90 %  93 %  97 %  94 %  93 %   Oxygen Saturation (Exercise)  97 %  90 %  93 %  94 %  91 %   Oxygen Saturation (Exit)  93 %  93 %  97 %  90 %  90 %   Rating of Perceived Exertion (Exercise)  _0 Perceived Dyspnea (Exercise)  _1 Symptoms  - SOB  -  -  -  -   Duration  Progress to 30 minutes of  aerobic without signs/symptoms of physical distress  Progress to 30 minutes of  aerobic without signs/symptoms of physical distress  Progress to 30 minutes of  aerobic without signs/symptoms of physical distress  Progress to 30 minutes of  aerobic without signs/symptoms of physical distress  Progress to 30 minutes of  aerobic without signs/symptoms of physical distress   Intensity  THRR New 95-109-122  THRR  unchanged  THRR unchanged  THRR unchanged  THRR  unchanged     Progression   Progression  Continue to progress workloads to maintain intensity without signs/symptoms of physical distress.  Continue to progress workloads to maintain intensity without signs/symptoms of physical distress.  Continue to progress workloads to maintain intensity without signs/symptoms of physical distress.  Continue to progress workloads to maintain intensity without signs/symptoms of physical distress.  Continue to progress workloads to maintain intensity without signs/symptoms of physical distress.   Average METs  1.98  1.5  1.78  1.9  1.9     Resistance Training   Training Prescription  No  Yes  Yes  Yes  Yes   Weight  _0 Reps  10-15  10-15  10-15  10-15  10-15   Time  5 Minutes  5 Minutes  5 Minutes  5 Minutes  5 Minutes     Oxygen   Oxygen  -  -  Continuous  Continuous  Continuous   Liters  -  -  _1 Treadmill   MPH  0.8  0.8  1  1.2  1.3   Grade  0  0  0  0  0   Minutes  _2 METs  1.6  1.61  1.76  1.9  1.99     NuStep   Level  _3 SPM  62  84  85  85  92   Minutes  _4 METs  1.6  1.5  1.8  1.9  1.8     Home Exercise Plan   Plans to continue exercise at  -  -  -  Home (comment) walking  Home (comment)   Frequency  -  -  -  Add 3 additional days to program exercise sessions.  Add 3 additional days to program exercise sessions.   Initial Home Exercises Provided  -  -  -  04/29/18  04/29/18   Row Name 07/16/18 1400 07/29/18 0800 08/12/18 1400         Response to Exercise   Blood Pressure (Admit)  114/62  114/66  128/64     Blood Pressure (Exercise)  114/60  128/60  126/70     Blood Pressure (Exit)  116/64  110/66  122/62     Heart Rate (Admit)  80 bpm  69 bpm  68 bpm     Heart Rate (Exercise)  83 bpm  81 bpm  88 bpm     Heart Rate (Exit)  82 bpm  73 bpm  75 bpm     Oxygen Saturation (Admit)  94 %  95 %  93 %     Oxygen Saturation (Exercise)  92 %  92 %  90 %     Oxygen  Saturation (Exit)  98 %  95 %  94 %     Rating of Perceived Exertion (Exercise)  _5 Perceived Dyspnea (Exercise)  _6 Symptoms  -  has been having some hip pain   been out since 08/03/2018     Duration  Progress to 30 minutes of  aerobic without signs/symptoms of physical distress  Progress  to 30 minutes of  aerobic without signs/symptoms of physical distress  Progress to 30 minutes of  aerobic without signs/symptoms of physical distress     Intensity  THRR unchanged  THRR unchanged  THRR unchanged       Progression   Progression  Continue to progress workloads to maintain intensity without signs/symptoms of physical distress.  Continue to progress workloads to maintain intensity without signs/symptoms of physical distress.  Continue to progress workloads to maintain intensity without signs/symptoms of physical distress.     Average METs  1.92  1.92  1.95       Resistance Training   Training Prescription  Yes  Yes  Yes     Weight  _0 Reps  10-15  10-15  10-15     Time  5 Minutes  5 Minutes  5 Minutes       Oxygen   Oxygen  Continuous  Continuous  Continuous     Liters  _1 Treadmill   MPH  1.5  1.5  1.6     Grade  0  0  0     Minutes  _2 METs  2.14  2.14  2.22       NuStep   Level  _3 SPM  110  75  90     Minutes  _4 METs  1.8  1.7  1.7       Home Exercise Plan   Plans to continue exercise at  Home (comment)  Home (comment)  Home (comment)     Frequency  Add 3 additional days to program exercise sessions.  Add 3 additional days to program exercise sessions.  Add 3 additional days to program exercise sessions.     Initial Home Exercises Provided  04/29/18  04/29/18  04/29/18        Exercise Comments:  Exercise Comments    Row Name 04/22/18 0802 04/30/18 1454 05/14/18 0749 06/01/18 1322 06/30/18 1515   Exercise Comments  Patient is not feeling her best due to her stomach issues. She wants to come and  wants to get stronger.   Patient still experiences weakness due to stomach pain but has continued to attend rehab to reach her goals.   Patient is doing better. She needs to be more consistent.   Patient has been attending regularly and seems to be getting stronger. Supplemental oxygen has helped her a lot.   Patient is back after taking last week off for doctors appointments. She is feeling stronger and says she's able to cook more for her husband.    [place] Name 07/20/18 1454 08/12/18 1457         Exercise Comments  Paitent has done well over the past 2 weeks. She reports feeling stronger and is excited to be able to eat more.   Pt. continues to do well in the program. She has been sick and unable to exercise since 08/03/2018. We will continue to monitor and progress as tolerated,         Exercise Goals and Review:  Exercise Goals    Row Name 04/08/18 1553             Exercise Goals   Increase Physical Activity  Yes  Intervention  Provide advice, education, support and counseling about physical activity/exercise needs.;Develop an individualized exercise prescription for aerobic and resistive training based on initial evaluation findings, risk stratification, comorbidities and participant's personal goals.       Expected Outcomes  Short Term: Attend rehab on a regular basis to increase amount of physical activity.;Long Term: Add in home exercise to make exercise part of routine and to increase amount of physical activity.       Increase Strength and Stamina  Yes       Intervention  Provide advice, education, support and counseling about physical activity/exercise needs.;Develop an individualized exercise prescription for aerobic and resistive training based on initial evaluation findings, risk stratification, comorbidities and participant's personal goals.       Expected Outcomes  Short Term: Increase workloads from initial exercise prescription for resistance, speed, and METs.;Long Term:  Improve cardiorespiratory fitness, muscular endurance and strength as measured by increased METs and functional capacity (6MWT)       Able to understand and use rate of perceived exertion (RPE) scale  Yes       Intervention  Provide education and explanation on how to use RPE scale       Expected Outcomes  Short Term: Able to use RPE daily in rehab to express subjective intensity level;Long Term:  Able to use RPE to guide intensity level when exercising independently       Able to understand and use Dyspnea scale  Yes       Intervention  Provide education and explanation on how to use Dyspnea scale       Expected Outcomes  Short Term: Able to use Dyspnea scale daily in rehab to express subjective sense of shortness of breath during exertion;Long Term: Able to use Dyspnea scale to guide intensity level when exercising independently       Knowledge and understanding of Target Heart Rate Range (THRR)  Yes       Intervention  Provide education and explanation of THRR including how the numbers were predicted and where they are located for reference       Expected Outcomes  Short Term: Able to use daily as guideline for intensity in rehab;Long Term: Able to use THRR to govern intensity when exercising independently       Able to check pulse independently  Yes       Intervention  Provide education and demonstration on how to check pulse in carotid and radial arteries.;Review the importance of being able to check your own pulse for safety during independent exercise       Expected Outcomes  Short Term: Able to explain why pulse checking is important during independent exercise;Long Term: Able to check pulse independently and accurately       Understanding of Exercise Prescription  Yes       Intervention  Provide education, explanation, and written materials on patient's individual exercise prescription       Expected Outcomes  Short Term: Able to explain program exercise prescription;Long Term: Able to  explain home exercise prescription to exercise independently          Exercise Goals Re-Evaluation : Exercise Goals Re-Evaluation    Row Name 04/22/18 0752 04/30/18 1452 05/14/18 0745 06/01/18 1320 06/30/18 1513     Exercise Goal Re-Evaluation   Exercise Goals Review  Increase Physical Activity;Increase Strength and Stamina;Able to understand and use Dyspnea scale;Understanding of Exercise Prescription;Knowledge and understanding of Target Heart Rate Range (THRR)  Increase Physical Activity;Increase Strength  and Stamina;Able to understand and use Dyspnea scale;Understanding of Exercise Prescription;Knowledge and understanding of Target Heart Rate Range (THRR)  Increase Physical Activity;Increase Strength and Stamina;Able to understand and use Dyspnea scale;Understanding of Exercise Prescription;Knowledge and understanding of Target Heart Rate Range (THRR)  Increase Physical Activity;Increase Strength and Stamina;Able to understand and use Dyspnea scale;Understanding of Exercise Prescription;Knowledge and understanding of Target Heart Rate Range (THRR)  Increase Physical Activity;Increase Strength and Stamina;Able to understand and use Dyspnea scale;Understanding of Exercise Prescription;Knowledge and understanding of Target Heart Rate Range (THRR)   Comments  Patient is still new to the program. She is having some stomach issues that makes it difficult for her to eat. When she comes she is sometimes weak because of not eating. She is seeing her doctor to determine what the problem is. She has not been progressed yet due to having doctors appointments about her stomach. She does like coming to the program and hopes it will help her reach her goals.   Patient has had 4 visits so far. She is still weak due to her stomach pains but will hopefully have those issues resolved soon.   Patient has had 6 visits so far. She is still weak due to her last episode of having urinary track infection. She is on an  antibiotics and they have added supplemental  oxygen. Will clarify the liter flow on her next visit.   Patient has been doing well in the program since being place on oxygen. She is working out on [employer]. She seems to be getting around better with it although she doesn't want it. Will continue to monitor and progress.   Patient continues to do well in the program. She has adjusted to having her oxygen full time and now sees the benefit. She is handling increases on boththe treadmill and nustep well.    Expected Outcomes  Get energy back, iIncrease appetite and continue to get stronger.   Get energy back, iIncrease appetite and continue to get stronger.   Get energy back, iIncrease appetite and continue to get stronger.   Increase energy levels, appetite and get stronger.   Increase energy levels, appetite and get stronger.    [place] Name 07/20/18 1453 08/12/18 1456           Exercise Goal Re-Evaluation   Exercise Goals Review  Increase Physical Activity;Increase Strength and Stamina;Able to understand and use Dyspnea scale;Understanding of Exercise Prescription;Knowledge and understanding of Target Heart Rate Range (THRR)  Increase Physical Activity;Increase Strength and Stamina;Able to understand and use Dyspnea scale;Understanding of Exercise Prescription;Knowledge and understanding of Target Heart Rate Range (THRR)      Comments  Patient is doing well in the program. She is now walking 1.104mh on the treadmill with ease. She has regained her appetite and has been able to put on a few pounds. We will continue to progress her as we see fit.   Pt. continues to due well in the program. She has been out due to sickness. Upon return we will continue to monitor and progress as tolerated.       Expected Outcomes  Increase energy levels and stamina.   Increase energy levels and stamina.          Discharge Exercise Prescription (Final Exercise Prescription Changes): Exercise Prescription Changes - 08/12/18 1400       Response to Exercise   Blood Pressure (Admit)  128/64    Blood Pressure (Exercise)  126/70    Blood Pressure (Exit)  122/62  Heart Rate (Admit)  68 bpm    Heart Rate (Exercise)  88 bpm    Heart Rate (Exit)  75 bpm    Oxygen Saturation (Admit)  93 %    Oxygen Saturation (Exercise)  90 %    Oxygen Saturation (Exit)  94 %    Rating of Perceived Exertion (Exercise)  11    Perceived Dyspnea (Exercise)  11    Symptoms  been out since 08/03/2018    Duration  Progress to 30 minutes of  aerobic without signs/symptoms of physical distress    Intensity  THRR unchanged      Progression   Progression  Continue to progress workloads to maintain intensity without signs/symptoms of physical distress.    Average METs  1.95      Resistance Training   Training Prescription  Yes    Weight  3    Reps  10-15    Time  5 Minutes      Oxygen   Oxygen  Continuous    Liters  3      Treadmill   MPH  1.6    Grade  0    Minutes  17    METs  2.22      NuStep   Level  2    SPM  90    Minutes  22    METs  1.7      Home Exercise Plan   Plans to continue exercise at  Home (comment)    Frequency  Add 3 additional days to program exercise sessions.    Initial Home Exercises Provided  04/29/18       Nutrition:  Target Goals: Understanding of nutrition guidelines, daily intake of sodium <1546m, cholesterol <2036m calories 30% from fat and 7% or less from saturated fats, daily to have 5 or more servings of fruits and vegetables.  Biometrics: Pre Biometrics - 04/08/18 1610      Pre Biometrics   Height  _0  (1.651 m)    Weight  69.3 kg    Waist Circumference  26 inches    Hip Circumference  29 inches    Waist to Hip Ratio  0.9 %    BMI (Calculated)  25.41    Triceps Skinfold  6 mm    % Body Fat  15 %    Grip Strength  32 kg    Flexibility  0 in    Single Leg Stand  10 seconds        Nutrition Therapy Plan and Nutrition Goals: Nutrition Therapy & Goals - 08/13/18 0740       Nutrition Therapy   RD appointment deferred  Yes      Personal Nutrition Goals   Comments  Patient's appetite continues to improve. She has lost 2 lbs since last 30 day review. Will continue to monitor for progress.        Nutrition Assessments: Nutrition Assessments - 04/08/18 1620      MEDFICTS Scores   Pre Score  27       Nutrition Goals Re-Evaluation:   Nutrition Goals Discharge (Final Nutrition Goals Re-Evaluation):   Psychosocial: Target Goals: Acknowledge presence or absence of significant depression and/or stress, maximize coping skills, provide positive support system. Participant is able to verbalize types and ability to use techniques and skills needed for reducing stress and depression.  Initial Review & Psychosocial Screening: Initial Psych Review & Screening - 04/08/18 1612      Initial Review  Current issues with  None Identified      Family Dynamics   Good Support System?  Yes      Barriers   Psychosocial barriers to participate in program  There are no identifiable barriers or psychosocial needs.      Screening Interventions   Interventions  Encouraged to exercise    Expected Outcomes  Short Term goal: Identification and review with participant of any Quality of Life or Depression concerns found by scoring the questionnaire.;Long Term goal: The participant improves quality of Life and PHQ9 Scores as seen by post scores and/or verbalization of changes       Quality of Life Scores: Quality of Life - 04/08/18 1613      Quality of Life   Select  Quality of Life      Quality of Life Scores   Health/Function Pre  20.38 %    Socioeconomic Pre  19.71 %    Psych/Spiritual Pre  20.14 %    Family Pre  20.6 %    GLOBAL Pre  20.23 %      Scores of 19 and below usually indicate a poorer quality of life in these areas.  A difference of  2-3 points is a clinically meaningful difference.  A difference of 2-3 points in the total score of the Quality of Life  Index has been associated with significant improvement in overall quality of life, self-image, physical symptoms, and general health in studies assessing change in quality of life.   PHQ-9: Recent Review Flowsheet Data    Depression screen [facility] 2/9 04/08/2018   Decreased Interest 2   Down, Depressed, Hopeless 1   PHQ - 2 Score 3   Altered sleeping 0   Tired, decreased energy 1   Change in appetite 1   Feeling bad or failure about yourself  1   Trouble concentrating 1   Moving slowly or fidgety/restless 0   Suicidal thoughts 0   PHQ-9 Score 7   Difficult doing work/chores Somewhat difficult     Interpretation of Total Score  Total Score Depression Severity:  1-4 = Minimal depression, 5-9 = Mild depression, 10-14 = Moderate depression, 15-19 = Moderately severe depression, 20-27 = Severe depression   Psychosocial Evaluation and Intervention: Psychosocial Evaluation - 04/08/18 1613      Psychosocial Evaluation & Interventions   Interventions  Encouraged to exercise with the program and follow exercise prescription    Continue Psychosocial Services   No Follow up required       Psychosocial Re-Evaluation: Psychosocial Re-Evaluation    [place] Name 05/13/18 1557 06/03/18 1511 07/01/18 1450 07/28/18 1448       Psychosocial Re-Evaluation   Current issues with  Current Depression  Current Depression  Current Depression  Current Depression    Comments  Patient's initial QOL score was 20.33 and her PHQ-9 score was 7. She says she is depressed but refuses any treatment. She feels she is able to manage it on her own.   Patient's initial QOL score was 20.33 and her PHQ-9 score was 7. She says she feels her depression has improved especially since her appetite is improving.   Patient's initial QOL score was 20.33 and her PHQ-9 score was 7. She says she feels her depression continues to improve especially since her appetite is improving.   Patient's initial QOL score was 20.33 and her PHQ-9 score  was 7. She says she feels her depression continues to improve especially since her appetite is improving.  Expected Outcomes  Patient's QOL and PHQ-9 score will improve and she will verbalize improvement in her depression at discharge.   Patient's QOL and PHQ-9 score will improve and she will verbalize improvement in her depression at discharge.   Patient's QOL and PHQ-9 score will improve and she will verbalize improvement in her depression at discharge.   Patient's QOL and PHQ-9 score will improve and she will verbalize improvement in her depression at discharge.     Interventions  Stress management education;Encouraged to attend Pulmonary Rehabilitation for the exercise;Relaxation education  Stress management education;Encouraged to attend Pulmonary Rehabilitation for the exercise;Relaxation education  Stress management education;Encouraged to attend Pulmonary Rehabilitation for the exercise;Relaxation education  Stress management education;Encouraged to attend Pulmonary Rehabilitation for the exercise;Relaxation education    Continue Psychosocial Services   Follow up required by staff  Follow up required by staff  Follow up required by staff  Follow up required by staff       Psychosocial Discharge (Final Psychosocial Re-Evaluation): Psychosocial Re-Evaluation - 07/28/18 1448      Psychosocial Re-Evaluation   Current issues with  Current Depression    Comments  Patient's initial QOL score was 20.33 and her PHQ-9 score was 7. She says she feels her depression continues to improve especially since her appetite is improving.     Expected Outcomes  Patient's QOL and PHQ-9 score will improve and she will verbalize improvement in her depression at discharge.     Interventions  Stress management education;Encouraged to attend Pulmonary Rehabilitation for the exercise;Relaxation education    Continue Psychosocial Services   Follow up required by staff        Education: Education Goals: Education  classes will be provided on a weekly basis, covering required topics. Participant will state understanding/return demonstration of topics presented.  Learning Barriers/Preferences: Learning Barriers/Preferences - 04/08/18 1520      Learning Barriers/Preferences   Learning Barriers  None    Learning Preferences  Skilled Demonstration;Group Instruction;Verbal Instruction       Education Topics: How Lungs Work and Diseases: - Discuss the anatomy of the lungs and diseases that can affect the lungs, such as COPD.   PULMONARY REHAB OTHER RESPIRATORY from 07/29/2018 in [place]  Date  07/01/18  Educator  DWynetta Emery  Instruction Review Code  2- Demonstrated Understanding      Exercise: -Discuss the importance of exercise, FITT principles of exercise, normal and abnormal responses to exercise, and how to exercise safely.   Environmental Irritants: -Discuss types of environmental irritants and how to limit exposure to environmental irritants.   PULMONARY REHAB OTHER RESPIRATORY from 07/29/2018 in [place]  Date  07/08/18  Educator  D. Coad  Instruction Review Code  2- Demonstrated Understanding      Meds/Inhalers and oxygen: - Discuss respiratory medications, definition of an inhaler and oxygen, and the proper way to use an inhaler and oxygen.   PULMONARY REHAB OTHER RESPIRATORY from 07/29/2018 in [place]  Date  04/15/18  Educator  D. Wynetta Emery      Energy Saving Techniques: - Discuss methods to conserve energy and decrease shortness of breath when performing activities of daily living.    Bronchial Hygiene / Breathing Techniques: - Discuss breathing mechanics, pursed-lip breathing technique,  proper posture, effective ways to clear airways, and other functional breathing techniques   PULMONARY REHAB OTHER RESPIRATORY from 07/29/2018 in [place]  Date  04/29/18  Educator  D. Wynetta Emery  Instruction Review Code  2- Demonstrated Understanding      Cleaning Equipment: - Provides group verbal and written instruction about the health risks of elevated stress, cause of high stress, and healthy ways to reduce stress.   Nutrition I: Fats: - Discuss the types of cholesterol, what cholesterol does to the body, and how cholesterol levels can be controlled.   Nutrition II: Labels: -Discuss the different components of food labels and how to read food labels.   PULMONARY REHAB OTHER RESPIRATORY from 07/29/2018 in [place]  Date  05/20/18  Educator  Etheleen Mayhew  Instruction Review Code  2- Demonstrated Understanding      Respiratory Infections: - Discuss the signs and symptoms of respiratory infections, ways to prevent respiratory infections, and the importance of seeking medical treatment when having a respiratory infection.   PULMONARY REHAB OTHER RESPIRATORY from 07/29/2018 in [place]  Date  06/03/18  Educator  DWynetta Emery  Instruction Review Code  2- Demonstrated Understanding      Stress I: Signs and Symptoms: - Discuss the causes of stress, how stress may lead to anxiety and depression, and ways to limit stress.   PULMONARY REHAB OTHER RESPIRATORY from 07/29/2018 in [place]  Date  06/10/18  Educator  Etheleen Mayhew  Instruction Review Code  2- Demonstrated Understanding      Stress II: Relaxation: -Discuss relaxation techniques to limit stress.   PULMONARY REHAB OTHER RESPIRATORY from 07/29/2018 in [place]  Date  06/17/18  Educator  Etheleen Mayhew  Instruction Review Code  2- Demonstrated Understanding      Oxygen for Home/Travel: - Discuss how to prepare for travel when on oxygen and proper ways to transport and store oxygen to ensure safety.   Knowledge Questionnaire Score: Knowledge Questionnaire Score - 04/08/18 1621      Knowledge Questionnaire Score   Pre  Score  12/18       Core Components/Risk Factors/Patient Goals at Admission: Personal Goals and Risk Factors at Admission - 04/08/18 1621      Core Components/Risk Factors/Patient Goals on Admission    Weight Management  Weight Maintenance    Personal Goal Other  Yes    Personal Goal  Gain energy, increase appetite, continue to get stronger through PR program    Intervention  Attend class 2 x week, supplement with 3 x week exercise at home.     Expected Outcomes  Reach personal goals.        Core Components/Risk Factors/Patient Goals Review:  Goals and Risk Factor Review    Row Name 05/13/18 1553 06/03/18 1506 07/01/18 1449 07/28/18 1445 08/13/18 0741     Core Components/Risk Factors/Patient Goals Review   Personal Goals Review  Improve shortness of breath with ADL's;Other Get energy back; increase appetite.   Improve shortness of breath with ADL's;Other Get energy back; increase appetite.   Improve shortness of breath with ADL's;Other Get energy back; increase appetitie.   Improve shortness of breath with ADL's;Other Get energy back; increase appetite.   Improve shortness of breath with ADL's;Other Get energy back; increase appetite.    Review  Patient has completed 6 sessions losing 4 lbs since her initial visit. Her attendance has been inconsistent due to illness, MD appointments and procedures. She recently has a UTI and was prescribed oxygen therapy. Her progress in the program has been impeded by her inconsistent attendance. Will continue to monitor for progress.   Patient has completed 11 sessions  maintaining her weight since last 30 day review; however, she says she has gained 4 lbs on her scales in recent weeks. Her appetite has improved and she is cooking more now. Her gastroenterologist adjusted her medications treating her nausea. She does feel better. She says she does feel stronger and is breathing better since they put her on O2. Will continue to monitor for progress.   Patient  has completed 17 sessions maintaining her weight since last 30 day review. She says her appetite continues to improve. She is doing well in the program with progression. She says she feels stronger and has more stamina. She is pleased with her progress in the program. Will continue to monitor for progress.   Patient has completed 21 sessions maintaining her weight since last 30 day review. Her appetite continues to improve. She continues to do well in the program with progression. She continues to say she feels stronger and has more energy. She says she feels less SOB and attributes this to the program and being on oxygen. Will continue to monitor for progress.   Patient has completed 23 sessions losing 2 lbs since last 30 day review. Her appetite continues to improve. She is doing well in the program with progression. She has been out for the past few sessions due to having a stomach virus. She says she is feeling like she is getting her energy back and does feel stronger. Will continue to monitor for progress.    Expected Outcomes  Patient will continue to attend sessions and complete the program and meet her personal goals.   Patient will continue to attend sessions and complete the program and meet her personal goals.   Patient will continue to attend sessions and complete the program and meet her personal goals.   Patient will continue to attend sessions and complete the program and meet her personal goals.   Patient will continue to attend sessions and complete the program and meet her personal goals.       Core Components/Risk Factors/Patient Goals at Discharge (Final Review):  Goals and Risk Factor Review - 08/13/18 0741      Core Components/Risk Factors/Patient Goals Review   Personal Goals Review  Improve shortness of breath with ADL's;Other   Get energy back; increase appetite.    Review  Patient has completed 23 sessions losing 2 lbs since last 30 day review. Her appetite continues to improve.  She is doing well in the program with progression. She has been out for the past few sessions due to having a stomach virus. She says she is feeling like she is getting her energy back and does feel stronger. Will continue to monitor for progress.     Expected Outcomes  Patient will continue to attend sessions and complete the program and meet her personal goals.        ITP Comments: ITP Comments    Row Name 04/22/18 1555           ITP Comments  Patient is new to the program. She has completed 3 sessions. She has been out for this week due to having an endoscopy procedure done on 04/20/18 with 2 biopsy's taken and dilation of esophagus with wide spread inflammation found of her stomach. Also had liver scan with fatty liver. Will have a follow up liver scan in 3 to 6 months. Will continue to monitor for progress.           Comments: ITP REVIEW Pt is making expected  progress toward pulmonary rehab goals after completing 23 sessions. Recommend continued exercise, life style modification, education, and utilization of breathing techniques to increase stamina and strength and decrease shortness of breath with exertion.

## 2018-08-19 ENCOUNTER — Encounter (HOSPITAL_COMMUNITY)
Admission: RE | Admit: 2018-08-19 | Discharge: 2018-08-19 | Disposition: A | Discharge status: Home / Self Care | Payer: Medicare HMO | Source: Ambulatory Visit | Attending: Internal Medicine | Admitting: Internal Medicine

## 2018-08-19 DIAGNOSIS — J849 Interstitial pulmonary disease, unspecified: Secondary | ICD-10-CM | POA: Diagnosis not present

## 2018-08-19 DIAGNOSIS — Z79899 Other long term (current) drug therapy: Secondary | ICD-10-CM | POA: Diagnosis not present

## 2018-08-19 DIAGNOSIS — Z87891 Personal history of nicotine dependence: Secondary | ICD-10-CM | POA: Diagnosis not present

## 2018-08-19 DIAGNOSIS — Z7982 Long term (current) use of aspirin: Secondary | ICD-10-CM | POA: Diagnosis not present

## 2018-08-19 NOTE — Progress Notes (Signed)
Daily Session Note  Patient Details  Name: MANALI MCELMURRY MRN: [id number] Date of Birth: Aug 19, 1931 Referring Provider:     PULMONARY REHAB OTHER RESP ORIENTATION from 04/08/2018 in [place]  Referring Provider  Ramaswamy      Encounter Date: 08/19/2018  Check In: Session Check In - 08/19/18 1330      Check-In   Supervising physician immediately available to respond to emergencies  See telemetry face sheet for immediately available MD    Location  [facility]    Staff Present  Russella Dar, MS, EP, [facility], Exercise Physiologist;Crisanto Nied Zachery Conch, Exercise Physiologist    Medication changes reported      No    Fall or balance concerns reported     No    Tobacco Cessation  No Change    Warm-up and Cool-down  Performed as group-led instruction    Resistance Training Performed  Yes    VAD Patient?  No    PAD/SET Patient?  No      Pain Assessment   Currently in Pain?  No/denies    Pain Score  0-No pain    Multiple Pain Sites  No       Capillary Blood Glucose: No results found for this or any previous visit (from the past 24 hour(s)).    Social History   Tobacco Use  Smoking Status Former Smoker  . Packs/day: 3.00  . Years: 20.00  . Pack years: 60.00  . Types: Cigarettes  . Last attempt to quit: 11/03/1972  . Years since quitting: 45.8  Smokeless Tobacco Never Used    Goals Met:  Proper associated with RPD/PD & O2 Sat Independence with exercise equipment Using PLB without cueing & demonstrates good technique Personal goals reviewed No report of cardiac concerns or symptoms Strength training completed today  Goals Unmet:  Not Applicable  Comments: Pt able to follow exercise prescription today without complaint.  Will continue to monitor for progression. Check out 1430.    Dr. Sinda Du is Medical Director for [facility].

## 2018-08-24 ENCOUNTER — Encounter (HOSPITAL_COMMUNITY): Payer: Medicare HMO

## 2018-08-26 ENCOUNTER — Encounter (HOSPITAL_COMMUNITY)
Admission: RE | Admit: 2018-08-26 | Discharge: 2018-08-26 | Disposition: A | Discharge status: Home / Self Care | Payer: Medicare HMO | Source: Ambulatory Visit | Attending: Internal Medicine | Admitting: Internal Medicine

## 2018-08-26 DIAGNOSIS — J849 Interstitial pulmonary disease, unspecified: Secondary | ICD-10-CM | POA: Diagnosis not present

## 2018-08-26 DIAGNOSIS — Z87891 Personal history of nicotine dependence: Secondary | ICD-10-CM | POA: Diagnosis not present

## 2018-08-26 DIAGNOSIS — Z7982 Long term (current) use of aspirin: Secondary | ICD-10-CM | POA: Insufficient documentation

## 2018-08-26 DIAGNOSIS — Z79899 Other long term (current) drug therapy: Secondary | ICD-10-CM | POA: Diagnosis not present

## 2018-08-26 NOTE — Progress Notes (Signed)
Daily Session Note  Patient Details  Name: NAKEDA LEBRON MRN: [id number] Date of Birth: October 08, 1930 Referring Provider:     PULMONARY REHAB OTHER RESP ORIENTATION from 04/08/2018 in [place]  Referring Provider  Ramaswamy      Encounter Date: 08/26/2018  Check In: Session Check In - 08/26/18 1330      Check-In   Supervising physician immediately available to respond to emergencies  See telemetry face sheet for immediately available MD    Location  [facility]    Staff Present  [name], Exercise Physiologist;Debra Wynetta Emery, RN, BSN;Other    Medication changes reported      No    Fall or balance concerns reported     No    Tobacco Cessation  No Change    Warm-up and Cool-down  Performed as group-led instruction    Resistance Training Performed  Yes    VAD Patient?  No    PAD/SET Patient?  No      Pain Assessment   Currently in Pain?  No/denies    Pain Score  0-No pain    Multiple Pain Sites  No       Capillary Blood Glucose: No results found for this or any previous visit (from the past 24 hour(s)).    Social History   Tobacco Use  Smoking Status Former Smoker  . Packs/day: 3.00  . Years: 20.00  . Pack years: 60.00  . Types: Cigarettes  . Last attempt to quit: 11/03/1972  . Years since quitting: 45.8  Smokeless Tobacco Never Used    Goals Met:  Proper associated with RPD/PD & O2 Sat Independence with exercise equipment Using PLB without cueing & demonstrates good technique Exercise tolerated well No report of cardiac concerns or symptoms Strength training completed today  Goals Unmet:  Not Applicable  Comments: Pt able to follow exercise prescription today without complaint.  Will continue to monitor for progression. Check out 1430.   Dr. Sinda Du is Medical Director for [facility] Pulmonary Re[facility].

## 2018-08-27 ENCOUNTER — Ambulatory Visit (HOSPITAL_COMMUNITY)
Admission: RE | Admit: 2018-08-27 | Discharge: 2018-08-27 | Disposition: A | Discharge status: Home / Self Care | Payer: Medicare HMO | Source: Ambulatory Visit | Attending: Gastroenterology | Admitting: Gastroenterology

## 2018-08-27 DIAGNOSIS — K760 Fatty (change of) liver, not elsewhere classified: Secondary | ICD-10-CM | POA: Insufficient documentation

## 2018-08-31 ENCOUNTER — Encounter (HOSPITAL_COMMUNITY)
Admission: RE | Admit: 2018-08-31 | Discharge: 2018-08-31 | Disposition: A | Discharge status: Home / Self Care | Payer: Medicare HMO | Source: Ambulatory Visit | Attending: Internal Medicine | Admitting: Internal Medicine

## 2018-08-31 DIAGNOSIS — J849 Interstitial pulmonary disease, unspecified: Secondary | ICD-10-CM

## 2018-08-31 DIAGNOSIS — Z7982 Long term (current) use of aspirin: Secondary | ICD-10-CM | POA: Diagnosis not present

## 2018-08-31 DIAGNOSIS — Z87891 Personal history of nicotine dependence: Secondary | ICD-10-CM | POA: Diagnosis not present

## 2018-08-31 DIAGNOSIS — Z79899 Other long term (current) drug therapy: Secondary | ICD-10-CM | POA: Diagnosis not present

## 2018-08-31 NOTE — Progress Notes (Signed)
Daily Session Note  Patient Details  Name: Ashlee Martin MRN: [id number] Date of Birth: 28-Feb-1931 Referring Provider:     PULMONARY REHAB OTHER RESP ORIENTATION from 04/08/2018 in [facility]  Referring Provider  Ramaswamy      Encounter Date: 08/31/2018  Check In: Session Check In - 08/31/18 1330      Check-In   Supervising physician immediately available to respond to emergencies  See telemetry face sheet for immediately available MD    Location  [facility]    Staff Present  [name], Exercise Physiologist;Other;Diane Coad, MS, EP, [facility], Exercise Physiologist    Medication changes reported      No    Fall or balance concerns reported     No    Tobacco Cessation  No Change    Warm-up and Cool-down  Performed as group-led instruction    Resistance Training Performed  Yes    VAD Patient?  No    PAD/SET Patient?  No      Pain Assessment   Currently in Pain?  No/denies    Pain Score  0-No pain    Multiple Pain Sites  No       Capillary Blood Glucose: No results found for this or any previous visit (from the past 24 hour(s)).    Social History   Tobacco Use  Smoking Status Former Smoker  . Packs/day: 3.00  . Years: 20.00  . Pack years: 60.00  . Types: Cigarettes  . Last attempt to quit: 11/03/1972  . Years since quitting: 45.8  Smokeless Tobacco Never Used    Goals Met:  Proper associated with RPD/PD & O2 Sat Independence with exercise equipment Using PLB without cueing & demonstrates good technique Exercise tolerated well No report of cardiac concerns or symptoms Strength training completed today  Goals Unmet:  Not Applicable  Comments: Pt able to follow exercise prescription today without complaint.  Will continue to monitor for progression. Check out 1430.   Dr. Sinda Du is Medical Director for [facility].

## 2018-08-31 NOTE — Progress Notes (Signed)
Fatty liver as already known. Area of focal fatty sparing previously seen is not well appreciated. No other concerns. Her LFTs are normal. Will follow clinically for now.

## 2018-09-02 ENCOUNTER — Encounter (HOSPITAL_COMMUNITY)
Admission: RE | Admit: 2018-09-02 | Discharge: 2018-09-02 | Disposition: A | Discharge status: Home / Self Care | Payer: Medicare HMO | Source: Ambulatory Visit | Attending: Internal Medicine | Admitting: Internal Medicine

## 2018-09-02 DIAGNOSIS — Z79899 Other long term (current) drug therapy: Secondary | ICD-10-CM | POA: Diagnosis not present

## 2018-09-02 DIAGNOSIS — Z87891 Personal history of nicotine dependence: Secondary | ICD-10-CM | POA: Diagnosis not present

## 2018-09-02 DIAGNOSIS — J849 Interstitial pulmonary disease, unspecified: Secondary | ICD-10-CM | POA: Diagnosis not present

## 2018-09-02 DIAGNOSIS — Z7982 Long term (current) use of aspirin: Secondary | ICD-10-CM | POA: Diagnosis not present

## 2018-09-02 NOTE — Progress Notes (Signed)
Pulmonary Individual Treatment Plan  Patient Details  Name: Ashlee Martin MRN: [id number] Date of Birth: 1931-07-22 Referring Provider:     PULMONARY REHAB OTHER RESP ORIENTATION from 04/08/2018 in [facility]  Referring Provider  [place]      Initial Encounter Date:    PULMONARY REHAB OTHER RESP ORIENTATION from 04/08/2018 in [facility]  Date  04/08/18      Visit Diagnosis: ILD (interstitial lung disease) ([place])  Patient's Home Medications on Admission:   Current Outpatient Medications:  .  atenolol (TENORMIN) 25 MG tablet, Take 25 mg by mouth 2 (two) times daily. , Disp: , Rfl:  .  benzonatate (TESSALON) 200 MG capsule, Take 1 capsule (200 mg total) by mouth 3 (three) times daily as needed for cough. (Patient not taking: Reported on 08/05/2018), Disp: 30 capsule, Rfl: 1 .  dextromethorphan-guaiFENesin (MUCINEX DM) 30-600 MG 12hr tablet, Take 1 tablet by mouth See admin instructions. Take 1 tablet daily in the morning, may repeat dose in the evening if needed for congestion., Disp: , Rfl:  .  doxylamine, Sleep, (UNISOM) 25 MG tablet, Take 25 mg by mouth at bedtime. , Disp: , Rfl:  .  Histamine Dihydrochloride (AUSTRALIAN DREAM ARTHRITIS EX), Apply 1 application topically 3 (three) times daily as needed (for pain.)., Disp: , Rfl:  .  Nintedanib (OFEV) 150 MG CAPS, Take 150 mg by mouth 2 (two) times daily., Disp: , Rfl:  .  pantoprazole (PROTONIX) 40 MG tablet, Take 1 tablet (40 mg total) by mouth daily. 30 minutes before breakfast, Disp: 90 tablet, Rfl: 3 .  Polyethyl Glycol-Propyl Glycol (SYSTANE OP), Place 1 drop into both eyes 3 (three) times daily as needed (dry/irritated eyes.). , Disp: , Rfl:  .  rOPINIRole (REQUIP) 2 MG tablet, Take 2 mg by mouth at bedtime. , Disp: , Rfl:  .  simvastatin (ZOCOR) 20 MG tablet, Take 20 mg by mouth at bedtime. , Disp: , Rfl:  .  VENTOLIN HFA 108 (90 Base) MCG/ACT inhaler, Inhale 1-2 puffs into the  lungs every 6 (six) hours as needed (chest congestion/cough). , Disp: , Rfl: 0 .  vitamin B-12 (CYANOCOBALAMIN) 1000 MCG tablet, Take 1,000 mcg by mouth at bedtime. , Disp: , Rfl:   Past Medical History: Past Medical History:  Diagnosis Date  . Arthritis   . Depression   . GERD (gastroesophageal reflux disease)   . Hypercholesterolemia   . Hypertension   . ILD (interstitial lung disease) ([place])   . Psoriasis    had been on Humira but continued to have UTIs, sinus infections, etc. Taken off  . Seizure ([place])    had 3-4 seizures, 8 years ago, placed on medication and has had no seizures since.  . Seizures ([place])     Tobacco Use: Social History   Tobacco Use  Smoking Status Former Smoker  . Packs/day: 3.00  . Years: 20.00  . Pack years: 60.00  . Types: Cigarettes  . Last attempt to quit: 11/03/1972  . Years since quitting: 45.8  Smokeless Tobacco Never Used    Labs: Recent Review Flowsheet Data    There is no flowsheet data to display.      Capillary Blood Glucose: No results found for: GLUCAP   Pulmonary Assessment Scores: Pulmonary Assessment Scores    Row Name 04/08/18 1617         ADL UCSD   ADL Phase  Entry     SOB Score total  41  Rest  0     Walk  8     Stairs  1     Bath  1     Dress  1     Shop  3       CAT Score   CAT Score  16       mMRC Score   mMRC Score  3        Pulmonary Function Assessment: Pulmonary Function Assessment - 04/08/18 1616      Pulmonary Function Tests   FVC%  58 %    FEV1%  62 %    FEV1/FVC Ratio  108    RV%  35 %    DLCO%  30 %      Initial Spirometry Results   FVC%  58 %    FEV1%  62 %    FEV1/FVC Ratio  108      Breath   Bilateral Breath Sounds  Clear    Shortness of Breath  Yes   Mostly during walk test.       Exercise Target Goals: Exercise Program Goal: Individual exercise prescription set using results from initial 6 min walk test and THRR while considering  patient's activity barriers and  safety.   Exercise Prescription Goal: Initial exercise prescription builds to 30-45 minutes a day of aerobic activity, 2-3 days per week.  Home exercise guidelines will be given to patient during program as part of exercise prescription that the participant will acknowledge.  Activity Barriers & Risk Stratification:   6 Minute Walk: 6 Minute Walk    Row Name 04/08/18 1537         6 Minute Walk   Phase  Initial     Distance  800 feet     Walk Time  6 minutes     # of Rest Breaks  1     MPH  1.51     METS  2.16     RPE  13     Perceived Dyspnea   13     Resting HR  68 bpm     Resting BP  110/62     Resting Oxygen Saturation   91 %     Exercise Oxygen Saturation  during 6 min walk  88 %     Max Ex. HR  93 bpm     Max Ex. BP  130/64     2 Minute Post BP  124/62        Oxygen Initial Assessment: Oxygen Initial Assessment - 04/08/18 1615      Home Oxygen   Home Oxygen Device  None    Sleep Oxygen Prescription  None    Home Exercise Oxygen Prescription  None    Home at Rest Exercise Oxygen Prescription  None    Compliance with Home Oxygen Use  --   N/A     Initial 6 min Walk   Oxygen Used  None      Program Oxygen Prescription   Program Oxygen Prescription  None       Oxygen Re-Evaluation: Oxygen Re-Evaluation    Row Name 05/13/18 1549 06/03/18 1503 07/01/18 1447 07/28/18 1444 08/13/18 0739     Program Oxygen Prescription   Program Oxygen Prescription  Continuous  Continuous  Continuous  Continuous  Continuous   Liters per minute  4  4 Exercise O2 at 3 to 4 L/M.  _0 FiO2%  92  90  93  95  94     Home Oxygen   Home Oxygen Device  Portable Musician   Sleep Oxygen Prescription  Continuous  Continuous  Continuous  Continuous  Continuous   Liters per minute  _0 Home Exercise Oxygen Prescription  Continuous  Continuous  Continuous  Continuous   Continuous   Liters per minute  _1 Home at Rest Exercise Oxygen Prescription  Continuous  Continuous  Continuous  -  Continuous   Liters per minute  _2 Compliance with Home Oxygen Use  Yes  Yes  Yes  Yes  Yes     Goals/Expected Outcomes   Short Term Goals  To learn and understand importance of monitoring SPO2 with pulse oximeter and demonstrate accurate use of the pulse oximeter.;To learn and exhibit compliance with exercise, home and travel O2 prescription;To learn and understand importance of maintaining oxygen saturations>88%;To learn and demonstrate proper pursed lip breathing techniques or other breathing techniques.  To learn and understand importance of monitoring SPO2 with pulse oximeter and demonstrate accurate use of the pulse oximeter.;To learn and exhibit compliance with exercise, home and travel O2 prescription;To learn and understand importance of maintaining oxygen saturations>88%;To learn and demonstrate proper pursed lip breathing techniques or other breathing techniques.  To learn and understand importance of monitoring SPO2 with pulse oximeter and demonstrate accurate use of the pulse oximeter.;To learn and exhibit compliance with exercise, home and travel O2 prescription;To learn and understand importance of maintaining oxygen saturations>88%;To learn and demonstrate proper pursed lip breathing techniques or other breathing techniques.  To learn and understand importance of monitoring SPO2 with pulse oximeter and demonstrate accurate use of the pulse oximeter.;To learn and exhibit compliance with exercise, home and travel O2 prescription;To learn and understand importance of maintaining oxygen saturations>88%;To learn and demonstrate proper pursed lip breathing techniques or other breathing techniques.  To learn and understand importance of monitoring SPO2 with pulse oximeter and demonstrate accurate use of the pulse oximeter.;To learn and exhibit compliance  with exercise, home and travel O2 prescription;To learn and understand importance of maintaining oxygen saturations>88%;To learn and demonstrate proper pursed lip breathing techniques or other breathing techniques.   Long  Term Goals  Exhibits compliance with exercise, home and travel O2 prescription;Verbalizes importance of monitoring SPO2 with pulse oximeter and return demonstration;Maintenance of O2 saturations>88%;Exhibits proper breathing techniques, such as pursed lip breathing or other method taught during program session  Exhibits compliance with exercise, home and travel O2 prescription;Verbalizes importance of monitoring SPO2 with pulse oximeter and return demonstration;Maintenance of O2 saturations>88%;Exhibits proper breathing techniques, such as pursed lip breathing or other method taught during program session  Exhibits compliance with exercise, home and travel O2 prescription;Verbalizes importance of monitoring SPO2 with pulse oximeter and return demonstration;Maintenance of O2 saturations>88%;Exhibits proper breathing techniques, such as pursed lip breathing or other method taught during program session  Exhibits compliance with exercise, home and travel O2 prescription;Verbalizes importance of monitoring SPO2 with pulse oximeter and return demonstration;Maintenance of O2 saturations>88%;Exhibits proper breathing techniques, such as pursed lip breathing or other method taught during program session  Exhibits compliance with exercise, home and travel O2 prescription;Verbalizes importance of monitoring SPO2 with pulse oximeter and return demonstration;Maintenance of O2 saturations>88%;Exhibits proper breathing techniques, such as pursed lip breathing or other method  taught during program session   Comments  Patient was recently prescribed O2 at home. Not sure if she is supposed to use continuously or as needed. Will clarify this at her next visit. Patient demonstrates proper pursed lip  breathing technique and proper usage of Pulse Oximetry and verbalizes understanding of maintaining her O2 Sat >88%. Will conitnue to monitor.   Patient was recently prescribed O2 at home. Not sure if she is supposed to use continuously or as needed. Will clarify this at her next visit. Patient demonstrates proper pursed lip breathing technique and proper usage of Pulse Oximetry and verbalizes understanding of maintaining her O2 Sat >88%. Will conitnue to monitor.   Patient was recently prescribed O2 at home. Not sure if she is supposed to use continuously or as needed. Will clarify this at her next visit. Patient demonstrates proper pursed lip breathing technique and proper usage of Pulse Oximetry and verbalizes understanding of maintaining her O2 Sat >88%. Will conitnue to monitor.   Patient was recently prescribed O2 at home. Not sure if she is supposed to use continuously or as needed. Will clarify this at her next visit. Patient demonstrates proper pursed lip breathing technique and proper usage of Pulse Oximetry and verbalizes understanding of maintaining her O2 Sat >88%. Will conitnue to monitor.   Patient was recently prescribed O2 at home. Not sure if she is supposed to use continuously or as needed. Will clarify this at her next visit. Patient demonstrates proper pursed lip breathing technique and proper usage of Pulse Oximetry and verbalizes understanding of maintaining her O2 Sat >88%. Will conitnue to monitor.    Goals/Expected Outcomes  Patient will continue to meet her short and long term goals.   Patient will continue to meet her short and long term goals.   Patient will continue to meet her short and long term goals.   Patient will continue to meet her short and long term goals.   Patient will continue to meet her short and long term goals.    [place] Name 09/02/18 1550             Program Oxygen Prescription   Program Oxygen Prescription  Continuous       Liters per minute  3       FiO2%  93          Home Oxygen   Home Oxygen Device  Portable Concentrator       Sleep Oxygen Prescription  Continuous       Liters per minute  2       Home Exercise Oxygen Prescription  Continuous       Liters per minute  3       Home at Rest Exercise Oxygen Prescription  Continuous       Liters per minute  2       Compliance with Home Oxygen Use  Yes         Goals/Expected Outcomes   Short Term Goals  To learn and understand importance of monitoring SPO2 with pulse oximeter and demonstrate accurate use of the pulse oximeter.;To learn and exhibit compliance with exercise, home and travel O2 prescription;To learn and understand importance of maintaining oxygen saturations>88%;To learn and demonstrate proper pursed lip breathing techniques or other breathing techniques.       Long  Term Goals  Exhibits compliance with exercise, home and travel O2 prescription;Verbalizes importance of monitoring SPO2 with pulse oximeter and return demonstration;Maintenance of O2 saturations>88%;Exhibits proper breathing techniques, such  as pursed lip breathing or other method taught during program session       Comments  Patient was recently prescribed O2 at home. Not sure if she is supposed to use continuously or as needed. Will clarify this at her next visit. Patient demonstrates proper pursed lip breathing technique and proper usage of Pulse Oximetry and verbalizes understanding of maintaining her O2 Sat >88%. Will conitnue to monitor.        Goals/Expected Outcomes  Patient will continue to meet her short and long term goals.           Oxygen Discharge (Final Oxygen Re-Evaluation): Oxygen Re-Evaluation - 09/02/18 1550      Program Oxygen Prescription   Program Oxygen Prescription  Continuous    Liters per minute  3    FiO2%  93      Home Oxygen   Home Oxygen Device  Portable Concentrator    Sleep Oxygen Prescription  Continuous    Liters per minute  2    Home Exercise Oxygen Prescription  Continuous     Liters per minute  3    Home at Rest Exercise Oxygen Prescription  Continuous    Liters per minute  2    Compliance with Home Oxygen Use  Yes      Goals/Expected Outcomes   Short Term Goals  To learn and understand importance of monitoring SPO2 with pulse oximeter and demonstrate accurate use of the pulse oximeter.;To learn and exhibit compliance with exercise, home and travel O2 prescription;To learn and understand importance of maintaining oxygen saturations>88%;To learn and demonstrate proper pursed lip breathing techniques or other breathing techniques.    Long  Term Goals  Exhibits compliance with exercise, home and travel O2 prescription;Verbalizes importance of monitoring SPO2 with pulse oximeter and return demonstration;Maintenance of O2 saturations>88%;Exhibits proper breathing techniques, such as pursed lip breathing or other method taught during program session    Comments  Patient was recently prescribed O2 at home. Not sure if she is supposed to use continuously or as needed. Will clarify this at her next visit. Patient demonstrates proper pursed lip breathing technique and proper usage of Pulse Oximetry and verbalizes understanding of maintaining her O2 Sat >88%. Will conitnue to monitor.     Goals/Expected Outcomes  Patient will continue to meet her short and long term goals.        Initial Exercise Prescription: Initial Exercise Prescription - 04/08/18 1500      Date of Initial Exercise RX and Referring Provider   Date  04/08/18    Referring Provider  Ramaswamy    Expected Discharge Date  07/24/18      Treadmill   MPH  0.8    Grade  0    Minutes  17    METs  1.6      NuStep   Level  1    SPM  42    Minutes  22    METs  1.7      Prescription Details   Frequency (times per week)  2    Duration  Progress to 30 minutes of continuous aerobic without signs/symptoms of physical distress      Intensity   THRR 40-80% of Max Heartrate  102-113-125    Ratings of  Perceived Exertion  11-13    Perceived Dyspnea  0-4      Progression   Progression  Continue progressive overload as per policy without signs/symptoms or physical distress.      Resistance  Training   Training Prescription  Yes    Weight  1    Reps  10-15       Perform Capillary Blood Glucose checks as needed.  Exercise Prescription Changes:  Exercise Prescription Changes    Row Name 04/22/18 0700 04/30/18 1400 05/27/18 0800 06/15/18 1400 07/01/18 1500     Response to Exercise   Blood Pressure (Admit)  132/60  102/60  102/62  144/66  120/60   Blood Pressure (Exercise)  152/68  132/68  126/64  144/66  134/60   Blood Pressure (Exit)  136/60  110/66  106/64  118/64  116/60   Heart Rate (Admit)  68 bpm  77 bpm  96 bpm  71 bpm  75 bpm   Heart Rate (Exercise)  96 bpm  79 bpm  113 bpm  84 bpm  87 bpm   Heart Rate (Exit)  76 bpm  79 bpm  103 bpm  77 bpm  97 bpm   Oxygen Saturation (Admit)  90 %  93 %  97 %  94 %  93 %   Oxygen Saturation (Exercise)  97 %  90 %  93 %  94 %  91 %   Oxygen Saturation (Exit)  93 %  93 %  97 %  90 %  90 %   Rating of Perceived Exertion (Exercise)  _0 Perceived Dyspnea (Exercise)  _1 Symptoms  - SOB  -  -  -  -   Duration  Progress to 30 minutes of  aerobic without signs/symptoms of physical distress  Progress to 30 minutes of  aerobic without signs/symptoms of physical distress  Progress to 30 minutes of  aerobic without signs/symptoms of physical distress  Progress to 30 minutes of  aerobic without signs/symptoms of physical distress  Progress to 30 minutes of  aerobic without signs/symptoms of physical distress   Intensity  THRR New 95-109-122  THRR unchanged  THRR unchanged  THRR unchanged  THRR unchanged     Progression   Progression  Continue to progress workloads to maintain intensity without signs/symptoms of physical distress.  Continue to progress workloads to maintain intensity without signs/symptoms of physical  distress.  Continue to progress workloads to maintain intensity without signs/symptoms of physical distress.  Continue to progress workloads to maintain intensity without signs/symptoms of physical distress.  Continue to progress workloads to maintain intensity without signs/symptoms of physical distress.   Average METs  1.98  1.5  1.78  1.9  1.9     Resistance Training   Training Prescription  No  Yes  Yes  Yes  Yes   Weight  _2 Reps  10-15  10-15  10-15  10-15  10-15   Time  5 Minutes  5 Minutes  5 Minutes  5 Minutes  5 Minutes     Oxygen   Oxygen  -  -  Continuous  Continuous  Continuous   Liters  -  -  _3 Treadmill   MPH  0.8  0.8  1  1.2  1.3   Grade  0  0  0  0  0   Minutes  _4 METs  1.6  1.61  1.76  1.9  1.99  NuStep   Level  _0 SPM  62  84  85  85  92   Minutes  _1 METs  1.6  1.5  1.8  1.9  1.8     Home Exercise Plan   Plans to continue exercise at  -  -  -  Home (comment) walking  Home (comment)   Frequency  -  -  -  Add 3 additional days to program exercise sessions.  Add 3 additional days to program exercise sessions.   Initial Home Exercises Provided  -  -  -  04/29/18  04/29/18   Row Name 07/16/18 1400 07/29/18 0800 08/12/18 1400 08/26/18 1400       Response to Exercise   Blood Pressure (Admit)  114/62  114/66  128/64  126/68    Blood Pressure (Exercise)  114/60  128/60  126/70  128/60    Blood Pressure (Exit)  116/64  110/66  122/62  118/64    Heart Rate (Admit)  80 bpm  69 bpm  68 bpm  68 bpm    Heart Rate (Exercise)  83 bpm  81 bpm  88 bpm  82 bpm    Heart Rate (Exit)  82 bpm  73 bpm  75 bpm  85 bpm    Oxygen Saturation (Admit)  94 %  95 %  93 %  96 %    Oxygen Saturation (Exercise)  92 %  92 %  90 %  90 %    Oxygen Saturation (Exit)  98 %  95 %  94 %  98 %    Rating of Perceived Exertion (Exercise)  _2 Perceived Dyspnea (Exercise)  _3 Symptoms  -  has  been having some hip pain   been out since 08/03/2018  -    Duration  Progress to 30 minutes of  aerobic without signs/symptoms of physical distress  Progress to 30 minutes of  aerobic without signs/symptoms of physical distress  Progress to 30 minutes of  aerobic without signs/symptoms of physical distress  Continue with 30 min of aerobic exercise without signs/symptoms of physical distress.    Intensity  THRR unchanged  THRR unchanged  THRR unchanged  THRR unchanged      Progression   Progression  Continue to progress workloads to maintain intensity without signs/symptoms of physical distress.  Continue to progress workloads to maintain intensity without signs/symptoms of physical distress.  Continue to progress workloads to maintain intensity without signs/symptoms of physical distress.  Continue to progress workloads to maintain intensity without signs/symptoms of physical distress.    Average METs  1.92  1.92  1.95  1.9      Resistance Training   Training Prescription  Yes  Yes  Yes  Yes    Weight  _4 Reps  10-15  10-15  10-15  10-15    Time  5 Minutes  5 Minutes  5 Minutes  5 Minutes      Oxygen   Oxygen  Continuous  Continuous  Continuous  Continuous    Liters  _5 Treadmill   MPH  1.5  1.5  1.6  1.6    Grade  0  0  0  0    Minutes  _0 METs  2.14  2.14  2.22  2.2      NuStep   Level  _1 SPM  110  75  90  96    Minutes  _2 METs  1.8  1.7  1.7  1.6      Home Exercise Plan   Plans to continue exercise at  Home (comment)  Home (comment)  Home (comment)  Home (comment)    Frequency  Add 3 additional days to program exercise sessions.  Add 3 additional days to program exercise sessions.  Add 3 additional days to program exercise sessions.  Add 3 additional days to program exercise sessions.    Initial Home Exercises Provided  04/29/18  04/29/18  04/29/18  04/29/18       Exercise Comments:  Exercise Comments     Row Name 04/22/18 0802 04/30/18 1454 05/14/18 0749 06/01/18 1322 06/30/18 1515   Exercise Comments  Patient is not feeling her best due to her stomach issues. She wants to come and wants to get stronger.   Patient still experiences weakness due to stomach pain but has continued to attend rehab to reach her goals.   Patient is doing better. She needs to be more consistent.   Patient has been attending regularly and seems to be getting stronger. Supplemental oxygen has helped her a lot.   Patient is back after taking last week off for doctors appointments. She is feeling stronger and says she's able to cook more for her husband.    [place] Name 07/20/18 1454 08/12/18 1457 09/02/18 0746       Exercise Comments  Paitent has done well over the past 2 weeks. She reports feeling stronger and is excited to be able to eat more.   Pt. continues to do well in the program. She has been sick and unable to exercise since 08/03/2018. We will continue to monitor and progress as tolerated,  Pt. returned 08/19/2018. She has tolerated the eexercise well after adjusting her TM speed to bring her back up to her ability before getting sick. We will continue to monitor and progress her as we see fit.         Exercise Goals and Review:  Exercise Goals    Row Name 04/08/18 1553             Exercise Goals   Increase Physical Activity  Yes       Intervention  Provide advice, education, support and counseling about physical activity/exercise needs.;Develop an individualized exercise prescription for aerobic and resistive training based on initial evaluation findings, risk stratification, comorbidities and participant's personal goals.       Expected Outcomes  Short Term: Attend rehab on a regular basis to increase amount of physical activity.;Long Term: Add in home exercise to make exercise part of routine and to increase amount of physical activity.       Increase Strength and Stamina  Yes       Intervention  Provide  advice, education, support and counseling about physical activity/exercise needs.;Develop an individualized exercise prescription for aerobic and resistive training based on initial evaluation findings, risk stratification, comorbidities and participant's personal goals.       Expected Outcomes  Short Term: Increase workloads from initial exercise prescription for resistance,  speed, and METs.;Long Term: Improve cardiorespiratory fitness, muscular endurance and strength as measured by increased METs and functional capacity (6MWT)       Able to understand and use rate of perceived exertion (RPE) scale  Yes       Intervention  Provide education and explanation on how to use RPE scale       Expected Outcomes  Short Term: Able to use RPE daily in rehab to express subjective intensity level;Long Term:  Able to use RPE to guide intensity level when exercising independently       Able to understand and use Dyspnea scale  Yes       Intervention  Provide education and explanation on how to use Dyspnea scale       Expected Outcomes  Short Term: Able to use Dyspnea scale daily in rehab to express subjective sense of shortness of breath during exertion;Long Term: Able to use Dyspnea scale to guide intensity level when exercising independently       Knowledge and understanding of Target Heart Rate Range (THRR)  Yes       Intervention  Provide education and explanation of THRR including how the numbers were predicted and where they are located for reference       Expected Outcomes  Short Term: Able to use daily as guideline for intensity in rehab;Long Term: Able to use THRR to govern intensity when exercising independently       Able to check pulse independently  Yes       Intervention  Provide education and demonstration on how to check pulse in carotid and radial arteries.;Review the importance of being able to check your own pulse for safety during independent exercise       Expected Outcomes  Short Term: Able to  explain why pulse checking is important during independent exercise;Long Term: Able to check pulse independently and accurately       Understanding of Exercise Prescription  Yes       Intervention  Provide education, explanation, and written materials on patient's individual exercise prescription       Expected Outcomes  Short Term: Able to explain program exercise prescription;Long Term: Able to explain home exercise prescription to exercise independently          Exercise Goals Re-Evaluation : Exercise Goals Re-Evaluation    [place] Name 04/22/18 0752 04/30/18 1452 05/14/18 0745 06/01/18 1320 06/30/18 1513     Exercise Goal Re-Evaluation   Exercise Goals Review  Increase Physical Activity;Increase Strength and Stamina;Able to understand and use Dyspnea scale;Understanding of Exercise Prescription;Knowledge and understanding of Target Heart Rate Range (THRR)  Increase Physical Activity;Increase Strength and Stamina;Able to understand and use Dyspnea scale;Understanding of Exercise Prescription;Knowledge and understanding of Target Heart Rate Range (THRR)  Increase Physical Activity;Increase Strength and Stamina;Able to understand and use Dyspnea scale;Understanding of Exercise Prescription;Knowledge and understanding of Target Heart Rate Range (THRR)  Increase Physical Activity;Increase Strength and Stamina;Able to understand and use Dyspnea scale;Understanding of Exercise Prescription;Knowledge and understanding of Target Heart Rate Range (THRR)  Increase Physical Activity;Increase Strength and Stamina;Able to understand and use Dyspnea scale;Understanding of Exercise Prescription;Knowledge and understanding of Target Heart Rate Range (THRR)   Comments  Patient is still new to the program. She is having some stomach issues that makes it difficult for her to eat. When she comes she is sometimes weak because of not eating. She is seeing her doctor to determine what the problem is. She has not been  progressed yet  due to having doctors appointments about her stomach. She does like coming to the program and hopes it will help her reach her goals.   Patient has had 4 visits so far. She is still weak due to her stomach pains but will hopefully have those issues resolved soon.   Patient has had 6 visits so far. She is still weak due to her last episode of having urinary track infection. She is on an antibiotics and they have added supplemental  oxygen. Will clarify the liter flow on her next visit.   Patient has been doing well in the program since being place on oxygen. She is working out on [employer]. She seems to be getting around better with it although she doesn't want it. Will continue to monitor and progress.   Patient continues to do well in the program. She has adjusted to having her oxygen full time and now sees the benefit. She is handling increases on boththe treadmill and nustep well.    Expected Outcomes  Get energy back, iIncrease appetite and continue to get stronger.   Get energy back, iIncrease appetite and continue to get stronger.   Get energy back, iIncrease appetite and continue to get stronger.   Increase energy levels, appetite and get stronger.   Increase energy levels, appetite and get stronger.    [place] Name 07/20/18 1453 08/12/18 1456 09/02/18 0745         Exercise Goal Re-Evaluation   Exercise Goals Review  Increase Physical Activity;Increase Strength and Stamina;Able to understand and use Dyspnea scale;Understanding of Exercise Prescription;Knowledge and understanding of Target Heart Rate Range (THRR)  Increase Physical Activity;Increase Strength and Stamina;Able to understand and use Dyspnea scale;Understanding of Exercise Prescription;Knowledge and understanding of Target Heart Rate Range (THRR)  Increase Physical Activity;Increase Strength and Stamina;Able to understand and use Dyspnea scale;Understanding of Exercise Prescription;Knowledge and understanding of Target Heart Rate Range  (THRR)     Comments  Patient is doing well in the program. She is now walking 1.53mh on the treadmill with ease. She has regained her appetite and has been able to put on a few pounds. We will continue to progress her as we see fit.   Pt. continues to due well in the program. She has been out due to sickness. Upon return we will continue to monitor and progress as tolerated.   Pt. has been able to return to the program. She is back to working hard and tolerating the exercise well. We have readjusted her speed on the TM to get her back to where she was before getting sick. We will continue to progress her as needed, when she's ready.      Expected Outcomes  Increase energy levels and stamina.   Increase energy levels and stamina.   Increase energy levels and stamina.         Discharge Exercise Prescription (Final Exercise Prescription Changes): Exercise Prescription Changes - 08/26/18 1400      Response to Exercise   Blood Pressure (Admit)  126/68    Blood Pressure (Exercise)  128/60    Blood Pressure (Exit)  118/64    Heart Rate (Admit)  68 bpm    Heart Rate (Exercise)  82 bpm    Heart Rate (Exit)  85 bpm    Oxygen Saturation (Admit)  96 %    Oxygen Saturation (Exercise)  90 %    Oxygen Saturation (Exit)  98 %    Rating of Perceived Exertion (Exercise)  11  Perceived Dyspnea (Exercise)  11    Duration  Continue with 30 min of aerobic exercise without signs/symptoms of physical distress.    Intensity  THRR unchanged      Progression   Progression  Continue to progress workloads to maintain intensity without signs/symptoms of physical distress.    Average METs  1.9      Resistance Training   Training Prescription  Yes    Weight  3    Reps  10-15    Time  5 Minutes      Oxygen   Oxygen  Continuous    Liters  3      Treadmill   MPH  1.6    Grade  0    Minutes  17    METs  2.2      NuStep   Level  2    SPM  96    Minutes  22    METs  1.6      Home Exercise Plan   Plans  to continue exercise at  Home (comment)    Frequency  Add 3 additional days to program exercise sessions.    Initial Home Exercises Provided  04/29/18       Nutrition:  Target Goals: Understanding of nutrition guidelines, daily intake of sodium <157m, cholesterol <2052m calories 30% from fat and 7% or less from saturated fats, daily to have 5 or more servings of fruits and vegetables.  Biometrics: Pre Biometrics - 04/08/18 1610      Pre Biometrics   Height  _0  (1.651 m)    Weight  69.3 kg    Waist Circumference  26 inches    Hip Circumference  29 inches    Waist to Hip Ratio  0.9 %    BMI (Calculated)  25.41    Triceps Skinfold  6 mm    % Body Fat  15 %    Grip Strength  32 kg    Flexibility  0 in    Single Leg Stand  10 seconds        Nutrition Therapy Plan and Nutrition Goals: Nutrition Therapy & Goals - 09/02/18 1552      Nutrition Therapy   RD appointment deferred  Yes      Personal Nutrition Goals   Comments  Patient's appetite continues to improve. She has lost 2 lbs since last 30 day review. Will continue to monitor for progress.       Intervention Plan   Intervention  Nutrition handout(s) given to patient.       Nutrition Assessments: Nutrition Assessments - 04/08/18 1620      MEDFICTS Scores   Pre Score  27       Nutrition Goals Re-Evaluation:   Nutrition Goals Discharge (Final Nutrition Goals Re-Evaluation):   Psychosocial: Target Goals: Acknowledge presence or absence of significant depression and/or stress, maximize coping skills, provide positive support system. Participant is able to verbalize types and ability to use techniques and skills needed for reducing stress and depression.  Initial Review & Psychosocial Screening: Initial Psych Review & Screening - 04/08/18 1612      Initial Review   Current issues with  None Identified      Family Dynamics   Good Support System?  Yes      Barriers   Psychosocial barriers to participate  in program  There are no identifiable barriers or psychosocial needs.      Screening Interventions   Interventions  Encouraged to  exercise    Expected Outcomes  Short Term goal: Identification and review with participant of any Quality of Life or Depression concerns found by scoring the questionnaire.;Long Term goal: The participant improves quality of Life and PHQ9 Scores as seen by post scores and/or verbalization of changes       Quality of Life Scores: Quality of Life - 04/08/18 1613      Quality of Life   Select  Quality of Life      Quality of Life Scores   Health/Function Pre  20.38 %    Socioeconomic Pre  19.71 %    Psych/Spiritual Pre  20.14 %    Family Pre  20.6 %    GLOBAL Pre  20.23 %      Scores of 19 and below usually indicate a poorer quality of life in these areas.  A difference of  2-3 points is a clinically meaningful difference.  A difference of 2-3 points in the total score of the Quality of Life Index has been associated with significant improvement in overall quality of life, self-image, physical symptoms, and general health in studies assessing change in quality of life.   PHQ-9: Recent Review Flowsheet Data    Depression screen [facility] 2/9 04/08/2018   Decreased Interest 2   Down, Depressed, Hopeless 1   PHQ - 2 Score 3   Altered sleeping 0   Tired, decreased energy 1   Change in appetite 1   Feeling bad or failure about yourself  1   Trouble concentrating 1   Moving slowly or fidgety/restless 0   Suicidal thoughts 0   PHQ-9 Score 7   Difficult doing work/chores Somewhat difficult     Interpretation of Total Score  Total Score Depression Severity:  1-4 = Minimal depression, 5-9 = Mild depression, 10-14 = Moderate depression, 15-19 = Moderately severe depression, 20-27 = Severe depression   Psychosocial Evaluation and Intervention: Psychosocial Evaluation - 04/08/18 1613      Psychosocial Evaluation & Interventions   Interventions  Encouraged to  exercise with the program and follow exercise prescription    Continue Psychosocial Services   No Follow up required       Psychosocial Re-Evaluation: Psychosocial Re-Evaluation    [place] Name 05/13/18 1557 06/03/18 1511 07/01/18 1450 07/28/18 1448 08/13/18 0744     Psychosocial Re-Evaluation   Current issues with  Current Depression  Current Depression  Current Depression  Current Depression  Current Depression   Comments  Patient's initial QOL score was 20.33 and her PHQ-9 score was 7. She says she is depressed but refuses any treatment. She feels she is able to manage it on her own.   Patient's initial QOL score was 20.33 and her PHQ-9 score was 7. She says she feels her depression has improved especially since her appetite is improving.   Patient's initial QOL score was 20.33 and her PHQ-9 score was 7. She says she feels her depression continues to improve especially since her appetite is improving.   Patient's initial QOL score was 20.33 and her PHQ-9 score was 7. She says she feels her depression continues to improve especially since her appetite is improving.   Patient's initial QOL score was 20.33 and her PHQ-9 score was 7. She says she feels her depression continues to improve especially since her appetite is improving.    Expected Outcomes  Patient's QOL and PHQ-9 score will improve and she will verbalize improvement in her depression at discharge.   Patient's QOL  and PHQ-9 score will improve and she will verbalize improvement in her depression at discharge.   Patient's QOL and PHQ-9 score will improve and she will verbalize improvement in her depression at discharge.   Patient's QOL and PHQ-9 score will improve and she will verbalize improvement in her depression at discharge.   Patient's QOL and PHQ-9 score will improve and she will verbalize improvement in her depression at discharge.    Interventions  Stress management education;Encouraged to attend Pulmonary Rehabilitation for the  exercise;Relaxation education  Stress management education;Encouraged to attend Pulmonary Rehabilitation for the exercise;Relaxation education  Stress management education;Encouraged to attend Pulmonary Rehabilitation for the exercise;Relaxation education  Stress management education;Encouraged to attend Pulmonary Rehabilitation for the exercise;Relaxation education  Stress management education;Encouraged to attend Pulmonary Rehabilitation for the exercise;Relaxation education   Continue Psychosocial Services   Follow up required by staff  Follow up required by staff  Follow up required by staff  Follow up required by staff  Follow up required by staff   [place] Name 09/02/18 1554             Psychosocial Re-Evaluation   Current issues with  Current Depression       Comments  Patient's initial QOL score was 20.33 and her PHQ-9 score was 7. She says she feels her depression continues to improve especially since her appetite is improving.        Expected Outcomes  Patient's QOL and PHQ-9 score will improve and she will verbalize improvement in her depression at discharge.        Interventions  Stress management education;Encouraged to attend Pulmonary Rehabilitation for the exercise;Relaxation education       Continue Psychosocial Services   Follow up required by staff          Psychosocial Discharge (Final Psychosocial Re-Evaluation): Psychosocial Re-Evaluation - 09/02/18 1554      Psychosocial Re-Evaluation   Current issues with  Current Depression    Comments  Patient's initial QOL score was 20.33 and her PHQ-9 score was 7. She says she feels her depression continues to improve especially since her appetite is improving.     Expected Outcomes  Patient's QOL and PHQ-9 score will improve and she will verbalize improvement in her depression at discharge.     Interventions  Stress management education;Encouraged to attend Pulmonary Rehabilitation for the exercise;Relaxation education    Continue  Psychosocial Services   Follow up required by staff        Education: Education Goals: Education classes will be provided on a weekly basis, covering required topics. Participant will state understanding/return demonstration of topics presented.  Learning Barriers/Preferences: Learning Barriers/Preferences - 04/08/18 1520      Learning Barriers/Preferences   Learning Barriers  None    Learning Preferences  Skilled Demonstration;Group Instruction;Verbal Instruction       Education Topics: How Lungs Work and Diseases: - Discuss the anatomy of the lungs and diseases that can affect the lungs, such as COPD.   PULMONARY REHAB OTHER RESPIRATORY from 09/02/2018 in [place]  Date  07/01/18  Educator  DWynetta Emery  Instruction Review Code  2- Demonstrated Understanding      Exercise: -Discuss the importance of exercise, FITT principles of exercise, normal and abnormal responses to exercise, and how to exercise safely.   Environmental Irritants: -Discuss types of environmental irritants and how to limit exposure to environmental irritants.   PULMONARY REHAB OTHER RESPIRATORY from 09/02/2018 in [place]  Date  07/08/18  Educator  D. Coad  Instruction Review Code  2- Demonstrated Understanding      Meds/Inhalers and oxygen: - Discuss respiratory medications, definition of an inhaler and oxygen, and the proper way to use an inhaler and oxygen.   PULMONARY REHAB OTHER RESPIRATORY from 09/02/2018 in [place]  Date  04/15/18  Educator  D. Wynetta Emery      Energy Saving Techniques: - Discuss methods to conserve energy and decrease shortness of breath when performing activities of daily living.    Bronchial Hygiene / Breathing Techniques: - Discuss breathing mechanics, pursed-lip breathing technique,  proper posture, effective ways to clear airways, and other functional breathing techniques   PULMONARY REHAB OTHER  RESPIRATORY from 09/02/2018 in [place]  Date  04/29/18  Educator  Etheleen Mayhew  Instruction Review Code  2- Demonstrated Understanding      Cleaning Equipment: - Provides group verbal and written instruction about the health risks of elevated stress, cause of high stress, and healthy ways to reduce stress.   Nutrition I: Fats: - Discuss the types of cholesterol, what cholesterol does to the body, and how cholesterol levels can be controlled.   Nutrition II: Labels: -Discuss the different components of food labels and how to read food labels.   PULMONARY REHAB OTHER RESPIRATORY from 09/02/2018 in [place]  Date  05/20/18  Educator  Etheleen Mayhew  Instruction Review Code  2- Demonstrated Understanding      Respiratory Infections: - Discuss the signs and symptoms of respiratory infections, ways to prevent respiratory infections, and the importance of seeking medical treatment when having a respiratory infection.   PULMONARY REHAB OTHER RESPIRATORY from 09/02/2018 in [place]  Date  06/03/18  Educator  DWynetta Emery  Instruction Review Code  2- Demonstrated Understanding      Stress I: Signs and Symptoms: - Discuss the causes of stress, how stress may lead to anxiety and depression, and ways to limit stress.   PULMONARY REHAB OTHER RESPIRATORY from 09/02/2018 in [place]  Date  06/10/18  Educator  Etheleen Mayhew  Instruction Review Code  2- Demonstrated Understanding      Stress II: Relaxation: -Discuss relaxation techniques to limit stress.   PULMONARY REHAB OTHER RESPIRATORY from 09/02/2018 in [place]  Date  06/17/18  Educator  Etheleen Mayhew  Instruction Review Code  2- Demonstrated Understanding      Oxygen for Home/Travel: - Discuss how to prepare for travel when on oxygen and proper ways to transport and store oxygen to ensure safety.   Knowledge Questionnaire  Score: Knowledge Questionnaire Score - 04/08/18 1621      Knowledge Questionnaire Score   Pre Score  12/18       Core Components/Risk Factors/Patient Goals at Admission: Personal Goals and Risk Factors at Admission - 04/08/18 1621      Core Components/Risk Factors/Patient Goals on Admission    Weight Management  Weight Maintenance    Personal Goal Other  Yes    Personal Goal  Gain energy, increase appetite, continue to get stronger through PR program    Intervention  Attend class 2 x week, supplement with 3 x week exercise at home.     Expected Outcomes  Reach personal goals.        Core Components/Risk Factors/Patient Goals Review:  Goals and Risk Factor Review    Row Name 05/13/18 1553 06/03/18 1506 07/01/18 1449 07/28/18 1445 08/13/18 0741  Core Components/Risk Factors/Patient Goals Review   Personal Goals Review  Improve shortness of breath with ADL's;Other Get energy back; increase appetite.   Improve shortness of breath with ADL's;Other Get energy back; increase appetite.   Improve shortness of breath with ADL's;Other Get energy back; increase appetitie.   Improve shortness of breath with ADL's;Other Get energy back; increase appetite.   Improve shortness of breath with ADL's;Other Get energy back; increase appetite.    Review  Patient has completed 6 sessions losing 4 lbs since her initial visit. Her attendance has been inconsistent due to illness, MD appointments and procedures. She recently has a UTI and was prescribed oxygen therapy. Her progress in the program has been impeded by her inconsistent attendance. Will continue to monitor for progress.   Patient has completed 11 sessions maintaining her weight since last 30 day review; however, she says she has gained 4 lbs on her scales in recent weeks. Her appetite has improved and she is cooking more now. Her gastroenterologist adjusted her medications treating her nausea. She does feel better. She says she does feel stronger and  is breathing better since they put her on O2. Will continue to monitor for progress.   Patient has completed 17 sessions maintaining her weight since last 30 day review. She says her appetite continues to improve. She is doing well in the program with progression. She says she feels stronger and has more stamina. She is pleased with her progress in the program. Will continue to monitor for progress.   Patient has completed 21 sessions maintaining her weight since last 30 day review. Her appetite continues to improve. She continues to do well in the program with progression. She continues to say she feels stronger and has more energy. She says she feels less SOB and attributes this to the program and being on oxygen. Will continue to monitor for progress.   Patient has completed 23 sessions losing 2 lbs since last 30 day review. Her appetite continues to improve. She is doing well in the program with progression. She has been out for the past few sessions due to having a stomach virus. She says she is feeling like she is getting her energy back and does feel stronger. Will continue to monitor for progress.    Expected Outcomes  Patient will continue to attend sessions and complete the program and meet her personal goals.   Patient will continue to attend sessions and complete the program and meet her personal goals.   Patient will continue to attend sessions and complete the program and meet her personal goals.   Patient will continue to attend sessions and complete the program and meet her personal goals.   Patient will continue to attend sessions and complete the program and meet her personal goals.    [place] Name 09/02/18 1551             Core Components/Risk Factors/Patient Goals Review   Personal Goals Review  Improve shortness of breath with ADL's;Other Get energy back; increase appetite.        Review  Patient has completed 27 sessions losing 2 lbs since last 30 day review. She says she has more energy  and feels like doing more around the house. She has missed some sessions recently due to a GI virus. She says her weakness has subsided from this and she feels more like herself again. She is pleased with her progress in the program. Will continue to monitor.  Expected Outcomes  Patient will continue to attend sessions and complete the program and meet her personal goals.           Core Components/Risk Factors/Patient Goals at Discharge (Final Review):  Goals and Risk Factor Review - 09/02/18 1551      Core Components/Risk Factors/Patient Goals Review   Personal Goals Review  Improve shortness of breath with ADL's;Other   Get energy back; increase appetite.    Review  Patient has completed 27 sessions losing 2 lbs since last 30 day review. She says she has more energy and feels like doing more around the house. She has missed some sessions recently due to a GI virus. She says her weakness has subsided from this and she feels more like herself again. She is pleased with her progress in the program. Will continue to monitor.     Expected Outcomes  Patient will continue to attend sessions and complete the program and meet her personal goals.        ITP Comments: ITP Comments    Row Name 04/22/18 1555           ITP Comments  Patient is new to the program. She has completed 3 sessions. She has been out for this week due to having an endoscopy procedure done on 04/20/18 with 2 biopsy's taken and dilation of esophagus with wide spread inflammation found of her stomach. Also had liver scan with fatty liver. Will have a follow up liver scan in 3 to 6 months. Will continue to monitor for progress.           Comments: ITP REVIEW Pt is making expected progress toward pulmonary rehab goals after completing 27 sessions. Recommend continued exercise, life style modification, education, and utilization of breathing techniques to increase stamina and strength and decrease shortness of breath with  exertion.

## 2018-09-02 NOTE — Progress Notes (Signed)
Daily Session Note  Patient Details  Name: WREATHA STURGEON MRN: [id number] Date of Birth: 08/02/1931 Referring Provider:     PULMONARY REHAB OTHER RESP ORIENTATION from 04/08/2018 in [place]  Referring Provider  Ramaswamy      Encounter Date: 09/02/2018  Check In: Session Check In - 09/02/18 1330      Check-In   Supervising physician immediately available to respond to emergencies  See telemetry face sheet for immediately available MD    Location  [facility]    Staff Present  [name], Exercise Physiologist;Diane Coad, MS, EP, CHC, Exercise Physiologist;Debra Wynetta Emery, RN, BSN    Medication changes reported      No    Fall or balance concerns reported     No    Tobacco Cessation  No Change    Warm-up and Cool-down  Performed as group-led instruction    Resistance Training Performed  Yes    VAD Patient?  No    PAD/SET Patient?  No      Pain Assessment   Currently in Pain?  No/denies    Pain Score  0-No pain    Multiple Pain Sites  No       Capillary Blood Glucose: No results found for this or any previous visit (from the past 24 hour(s)).    Social History   Tobacco Use  Smoking Status Former Smoker  . Packs/day: 3.00  . Years: 20.00  . Pack years: 60.00  . Types: Cigarettes  . Last attempt to quit: 11/03/1972  . Years since quitting: 45.8  Smokeless Tobacco Never Used    Goals Met:  Proper associated with RPD/PD & O2 Sat Independence with exercise equipment Using PLB without cueing & demonstrates good technique Exercise tolerated well No report of cardiac concerns or symptoms Strength training completed today  Goals Unmet:  Not Applicable  Comments: Pt able to follow exercise prescription today without complaint.  Will continue to monitor for progression. Check out 1430.   Dr. Sinda Du is the Medical Director for [facility].

## 2018-09-06 DIAGNOSIS — M1611 Unilateral primary osteoarthritis, right hip: Secondary | ICD-10-CM | POA: Diagnosis not present

## 2018-09-06 DIAGNOSIS — J849 Interstitial pulmonary disease, unspecified: Secondary | ICD-10-CM | POA: Diagnosis not present

## 2018-09-06 DIAGNOSIS — Z96649 Presence of unspecified artificial hip joint: Secondary | ICD-10-CM | POA: Diagnosis not present

## 2018-09-06 DIAGNOSIS — R0902 Hypoxemia: Secondary | ICD-10-CM | POA: Diagnosis not present

## 2018-09-07 ENCOUNTER — Encounter (HOSPITAL_COMMUNITY)
Admission: RE | Admit: 2018-09-07 | Discharge: 2018-09-07 | Disposition: A | Discharge status: Home / Self Care | Payer: Medicare HMO | Source: Ambulatory Visit | Attending: Internal Medicine | Admitting: Internal Medicine

## 2018-09-07 DIAGNOSIS — J849 Interstitial pulmonary disease, unspecified: Secondary | ICD-10-CM | POA: Diagnosis not present

## 2018-09-07 DIAGNOSIS — Z87891 Personal history of nicotine dependence: Secondary | ICD-10-CM | POA: Diagnosis not present

## 2018-09-07 DIAGNOSIS — Z79899 Other long term (current) drug therapy: Secondary | ICD-10-CM | POA: Diagnosis not present

## 2018-09-07 DIAGNOSIS — Z7982 Long term (current) use of aspirin: Secondary | ICD-10-CM | POA: Diagnosis not present

## 2018-09-07 NOTE — Progress Notes (Signed)
Daily Session Note  Patient Details  Name: KHAI TORBERT MRN: [id number] Date of Birth: February 16, 1931 Referring Provider:     PULMONARY REHAB OTHER RESP ORIENTATION from 04/08/2018 in [place]  Referring Provider  Ramaswamy      Encounter Date: 09/07/2018  Check In: Session Check In - 09/07/18 1330      Check-In   Supervising physician immediately available to respond to emergencies  See telemetry face sheet for immediately available MD    Location  [facility]    Staff Present  [name], Exercise Physiologist;Debra Wynetta Emery, RN, BSN    Medication changes reported      No    Fall or balance concerns reported     No    Tobacco Cessation  No Change    Warm-up and Cool-down  Performed as group-led instruction    Resistance Training Performed  Yes    VAD Patient?  No    PAD/SET Patient?  No      Pain Assessment   Currently in Pain?  No/denies    Pain Score  0-No pain    Multiple Pain Sites  No       Capillary Blood Glucose: No results found for this or any previous visit (from the past 24 hour(s)).    Social History   Tobacco Use  Smoking Status Former Smoker  . Packs/day: 3.00  . Years: 20.00  . Pack years: 60.00  . Types: Cigarettes  . Last attempt to quit: 11/03/1972  . Years since quitting: 45.8  Smokeless Tobacco Never Used    Goals Met:  Proper associated with RPD/PD & O2 Sat Independence with exercise equipment Using PLB without cueing & demonstrates good technique Exercise tolerated well No report of cardiac concerns or symptoms Strength training completed today  Goals Unmet:  Not Applicable  Comments: Pt able to follow exercise prescription today without complaint.  Will continue to monitor for progression. Check out 1430.   Dr. Sinda Du is Medical Director for [facility].

## 2018-09-09 ENCOUNTER — Encounter (HOSPITAL_COMMUNITY)
Admission: RE | Admit: 2018-09-09 | Discharge: 2018-09-09 | Disposition: A | Discharge status: Home / Self Care | Payer: Medicare HMO | Source: Ambulatory Visit | Attending: Internal Medicine | Admitting: Internal Medicine

## 2018-09-09 DIAGNOSIS — J849 Interstitial pulmonary disease, unspecified: Secondary | ICD-10-CM | POA: Diagnosis not present

## 2018-09-09 DIAGNOSIS — Z87891 Personal history of nicotine dependence: Secondary | ICD-10-CM | POA: Diagnosis not present

## 2018-09-09 DIAGNOSIS — Z79899 Other long term (current) drug therapy: Secondary | ICD-10-CM | POA: Diagnosis not present

## 2018-09-09 DIAGNOSIS — Z7982 Long term (current) use of aspirin: Secondary | ICD-10-CM | POA: Diagnosis not present

## 2018-09-09 NOTE — Progress Notes (Signed)
Daily Session Note  Patient Details  Name: Ashlee Martin MRN: [id number] Date of Birth: 1931/08/17 Referring Provider:     PULMONARY REHAB OTHER RESP ORIENTATION from 04/08/2018 in [place]  Referring Provider  Ramaswamy      Encounter Date: 09/09/2018  Check In: Session Check In - 09/09/18 1330      Check-In   Supervising physician immediately available to respond to emergencies  See telemetry face sheet for immediately available MD    Location  [facility]    Staff Present  [name], Exercise Physiologist;Other    Medication changes reported      No    Fall or balance concerns reported     No    Tobacco Cessation  No Change    Warm-up and Cool-down  Performed as group-led instruction    Resistance Training Performed  Yes    VAD Patient?  No    PAD/SET Patient?  No      Pain Assessment   Currently in Pain?  No/denies    Pain Score  0-No pain    Multiple Pain Sites  No       Capillary Blood Glucose: No results found for this or any previous visit (from the past 24 hour(s)).    Social History   Tobacco Use  Smoking Status Former Smoker  . Packs/day: 3.00  . Years: 20.00  . Pack years: 60.00  . Types: Cigarettes  . Last attempt to quit: 11/03/1972  . Years since quitting: 45.8  Smokeless Tobacco Never Used    Goals Met:  Proper associated with RPD/PD & O2 Sat Independence with exercise equipment Exercise tolerated well No report of cardiac concerns or symptoms Strength training completed today  Goals Unmet:  Not Applicable  Comments: Pt able to follow exercise prescription today without complaint.  Will continue to monitor for progression. Check out 1430.   Dr. Sinda Du is Medical Director for [facility].

## 2018-09-10 DIAGNOSIS — I10 Essential (primary) hypertension: Secondary | ICD-10-CM | POA: Diagnosis not present

## 2018-09-10 DIAGNOSIS — Z87891 Personal history of nicotine dependence: Secondary | ICD-10-CM | POA: Diagnosis not present

## 2018-09-10 DIAGNOSIS — Z299 Encounter for prophylactic measures, unspecified: Secondary | ICD-10-CM | POA: Diagnosis not present

## 2018-09-10 DIAGNOSIS — Z6824 Body mass index (BMI) 24.0-24.9, adult: Secondary | ICD-10-CM | POA: Diagnosis not present

## 2018-09-10 DIAGNOSIS — H6123 Impacted cerumen, bilateral: Secondary | ICD-10-CM | POA: Diagnosis not present

## 2018-09-14 ENCOUNTER — Encounter (HOSPITAL_COMMUNITY): Payer: Medicare HMO

## 2018-09-16 ENCOUNTER — Ambulatory Visit (INDEPENDENT_AMBULATORY_CARE_PROVIDER_SITE_OTHER): Payer: Medicare HMO | Admitting: Internal Medicine

## 2018-09-16 ENCOUNTER — Encounter (HOSPITAL_COMMUNITY): Payer: Medicare HMO

## 2018-09-16 ENCOUNTER — Encounter: Payer: Self-pay | Admitting: Internal Medicine

## 2018-09-16 VITALS — BP 118/56 | HR 68 | Ht 65.0 in | Wt 144.8 lb

## 2018-09-16 DIAGNOSIS — J84112 Idiopathic pulmonary fibrosis: Secondary | ICD-10-CM

## 2018-09-16 DIAGNOSIS — Z5181 Encounter for therapeutic drug level monitoring: Secondary | ICD-10-CM | POA: Diagnosis not present

## 2018-09-16 DIAGNOSIS — J9611 Chronic respiratory failure with hypoxia: Secondary | ICD-10-CM

## 2018-09-16 DIAGNOSIS — R112 Nausea with vomiting, unspecified: Secondary | ICD-10-CM

## 2018-09-16 DIAGNOSIS — K521 Toxic gastroenteritis and colitis: Secondary | ICD-10-CM | POA: Diagnosis not present

## 2018-09-16 LAB — HEPATIC FUNCTION PANEL
ALK PHOS: 129 U/L — AB (ref 39–117)
ALT: 29 U/L (ref 0–35)
AST: 37 U/L (ref 0–37)
Albumin: 3.6 g/dL (ref 3.5–5.2)
BILIRUBIN DIRECT: 0.1 mg/dL (ref 0.0–0.3)
Total Bilirubin: 0.7 mg/dL (ref 0.2–1.2)
Total Protein: 7.4 g/dL (ref 6.0–8.3)

## 2018-09-16 MED ORDER — ONDANSETRON HCL 4 MG PO TABS: 4.0000 mg | ORAL_TABLET | Freq: Two times a day (BID) | ORAL | 0 refills | Status: DC

## 2018-09-16 MED ORDER — ONDANSETRON HCL 4 MG PO TABS: 4.0000 mg | ORAL_TABLET | Freq: Two times a day (BID) | ORAL | 1 refills | Status: DC | PRN

## 2018-09-16 NOTE — Addendum Note (Signed)
Addended by: Suzzanne Cloud E on: 09/16/2018 12:10 PM   Modules accepted: Orders

## 2018-09-16 NOTE — Progress Notes (Signed)
PCP Glenda Chroman, MD  HPI  IOV 11/03/2017  Chief Complaint  Patient presents with  . Consult    Self referral. Pt states she was diagnosed with pulmonary fibrosis x4 weeks ago. States she has c/o cough with green mucus, mild SOB. Denies any CP.    85 year old female self-referred for evaluation of pulmonary fibrosis.  She is fairly good historian.  She tells me that she has diagnosis of psoriasis of the skin in 2016 and was under the care of Dr. Channing Mutters in [place].  She was on Humira for 11 months taking monthly injections but her rash got bad in November 2017 and then she was treated with prednisone.  She went on vacation to [place] and then returned and then had a diagnosis of pneumonia.  She was hospitalized for this at [facility] in January 2018.  She was on oxygen and hospitalized for 3 or 4 days and then discharged.  After that she is always had some amount of residual cough and mild shortness of breath but starting approximately 6 months ago the cough and shortness of breath was more perceptible.  However in the last 2 months a significant acceleration in the severity of cough and shortness of breath.  The cough is described as waking up at night and associated with phlegm.  She also gets short of breath walking or hurrying on level ground or walking up a slight hill.  Her primary care physician did a CT chest with contrast in February 2019.  I do not have the image with me but I reviewed the report and he reports pulmonary fibrosis not otherwise specified.  She did end up in the emergency room on October 31, 2017 and is being discharged with [facility] and redness on taper which is helping.   American College of chest physicians interstitial lung disease questionnaire  Past medical history: Positive for seizures, history of pneumonia in January 2018, acid reflux disease, dry eyes, dry mouth, photophobia and arthralgia.  She has diagnosis of skin psoriasis.  This is not much of an issue at  this point in time  Personal exposure history: She started smoking cigarettes at 85 years of age and smoked 3-4 packs a day and quit in the mid 1980s.  She reports having had use street drugs.  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  She only occasionally visits the coop.  She opens the door and looks that she can out.  Otherwise no exposure to water damage or mold a humidifier [place] or hot tub or Jacuzzi or birds or feathered pillows.  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  Then she retired and moved to [place] in [place] where she lived in normal homes for 5 years up until 8 years ago and then she and her husband moved to [place], [place] where they live in a farm.  Pulmonary toxicity history: Other than exposure to Humira and short-term prednisone no other pulmonary toxicity history including nitrofurantoin or BCG or amiodarone or chemotherapy    Walking desaturation test on 11/03/2017 185 feet x 3 laps on ROOM AIR:  did walk at slow pace and got mildly dyspneic. Did desaturate to 89%. Rest pulse ox was 98%, final pulse ox was 89%. HR response 67/min at rest to 91/min at peak exertion. [name]  Did not Desaturate <  88% . Ashlee Martin did yes  Desaturated </= 3% points. Ashlee Martin yes did get tachyardic    12/08/2017 Acute OV :Sinus congestion .  Patient presents for an acute office visit.  Patient complains of sinus congestion , ear fullness and popping , sinus pressure , drippy nose, watery eyes, very little cough .  Has had this for last few months , Worse for the last 2 weeks .  Seen by PCP last week started on allergy pill and nasal spray , she does not know what they were.  Says she has chronic allergy problems , worse in Spring and Fall.  She denies any discolored mucus, sinus pain, teeth pain, fever, nausea vomiting diarrhea, or chest  pain.    Patient was seen for pulmonary consult March 2019 to establish for pulmonary fibrosis.  She has underlying psoriasis.  Patient was set up for a CT chest that showed fibrotic interstitial lung disease with mild honeycombing.  Autoimmune panel was essentially negative except for SS B IgG antibody.  And sed rate was mildly elevated at 34. Patient has an upcoming PFT and follow-up visit with ILD clinic in 2 weeks. She was having a severe cough last visit.  She was given a prednisone taper.  Patient says she did require one other prednisone taper but her cough is almost totally resolved.  She has a little mild cough in the morning but very mild.   OV 12/29/2017  Chief Complaint  Patient presents with  . Follow-up    Pt states she has had both good days and bad days. States she has had some problems with allergies.    Aneth Rolfe presns for followup for ILD workup.it is documented below.  Pulmonary function test in terms of severity shows moderate restriction with significant reduction in diffusion capacity.  Her high-resolution CT scan of the chest is read as probable UIP but I wonder if that is mixed emphysema although she quit smoking many decades ago and indeterminate pattern for UIP.  There might be air trapping but this is not reported.  I again went over her exposures and she confirms and her husband who is here with her today that there is significant chicken coop exposure ongoing currently.  There are no new interim issues other than the fact she had a respiratory exacerbation which required prednisone which seemed to help her symptoms considerably.  Currently she is off prednisone.  Her autoimmune vasculitis and hypersensitivity pneumonitis panel was essentially negative except for trace autoimmune positivity.     Results for Ashlee, Martin (MRN [id number]) as of 12/29/2017 13:37  Ref. Range 12/29/2017 12:43  FVC-Pre Latest Units: L 1.49  FVC-%Pred-Pre Latest Units: % 58  FEV1-Pre  Latest Units: L 1.18  FEV1-%Pred-Pre Latest Units: % 62  Pre FEV1/FVC ratio Latest Units: % 79  FEV1FVC-%Pred-Pre Latest Units: % 108  Results for SHELITHA, MAGLEY (MRN [id number]) as of 12/29/2017 13:37  Ref. Range 12/29/2017 12:43  DLCO unc Latest Units: ml/min/mmHg 7.82  DLCO unc % pred Latest Units: % 30   Results for MILES, BORKOWSKI (MRN [id number]) as of 12/29/2017 13:37  Ref. Range 11/03/2017 10:08  ASPERGILLUS FUMIGATUS Latest Ref Range: NEGATIVE  NEGATIVE  [place]on Serum Latest Ref Range: NEGATIVE  NEGATIVE  Anit Nuclear Antibody(ANA) Latest Ref Range: NEGATIVE  NEGATIVE  Angiotensin-Converting Enzyme Latest Ref Range: 9 - 67 U/L 28  Cyclic Citrullin Peptide Ab Latest Units: UNITS <16  ds DNA Ab Latest Units: IU/mL <1  ENA RNP Ab Latest Ref  Range: 0.0 - 0.9 AI <0.2  Myeloperoxidase Abs Latest Units: AI <1.0  Serine Protease 3 Latest Units: AI <1.0  RA Latex Turbid. Latest Ref Range: <14 IU/mL <14  SSA (Ro) (ENA) Antibody, IgG Latest Ref Range: <1.0 NEG AI <1.0 NEG  SSB (La) (ENA) Antibody, IgG Latest Ref Range: <1.0 NEG AI 1.8 POS (A)  Scleroderma (Scl-70) (ENA) Antibody, IgG Latest Ref Range: <1.0 NEG AI <1.0 NEG    IMPRESSION: 1. Spectrum of findings compatible with fibrotic interstitial lung disease with mild honeycombing. Despite the absence of a clear basilar gradient, these findings are considered to represent probable usual interstitial pneumonia (UIP). Fibrotic phase nonspecific interstitial pneumonia (NSIP) is on the differential. Follow-up high-resolution chest CT in 6-12 months would be useful to assess temporal pattern stability, as clinically warranted. 2. Tiny 3 mm solid left lower lobe pulmonary nodule. No follow-up needed if patient is low-risk. Non-contrast chest CT can be considered in 12 months if patient is high-risk. This recommendation follows the consensus statement: Guidelines for Management of Incidental Pulmonary Nodules Detected on CT Images:From  the Fleischner Society 2017; published online before print (10.1148/radiol.2493241991). 3. Three-vessel coronary atherosclerosis. 4. Dilated main pulmonary artery, suggesting pulmonary arterial hypertension.  Aortic Atherosclerosis (ICD10-I70.0).   Electronically Signed   By: Ilona Sorrel M.D.   On: 11/13/2017 16:46   OV 02/02/2018  Chief Complaint  Patient presents with  . Follow-up    Pt states things have been up and down since last visit and since bronch was performed. Pt still has problems with upset stomach and pt is still having problems with her breathing. Pt does cough in the morning with occ. green mucus. Denies any CP/chest tightness.    Follow-up interstitial lung disease.  This follow-up is after bronchoscopy with lavage which was done Jan 07, 2018.  This shows a slight preponderance of polymorphs and absence of lymphocytes thus suggesting this is not hypersensitivity pneumonitis but with the increase in polymorphs the diagnosis shift towards UIP.  I took a second opinion on radiology from Dr. Lorin Picket and she feels a CT scan is more consistent with UIP.  In the interim patient has no new complaints.  Walking desaturation test indicates a 7% pulse ox drop with tachycardia and shortness of breath but still does not drop below 88%.  Of note she does indicate that she has had chronic intermittent stress related nausea that has been worked up extensively but without any cause.   Results for MALIYAH, WILLETS (MRN [id number]) as of 01/07/2018 20:04  Ref. Range 01/07/2018 14:35  Color, Fluid Latest Ref Range: YELLOW  STRAW (A)  WBC, Fluid Latest Ref Range: 0 - 1,000 cu mm 73  Lymphs, Fluid Latest Units: % 6  Eos, Fluid Latest Units: % 4  Appearance, Fluid Latest Ref Range: CLEAR  CLOUDY (A)  Other Cells, Fluid Latest Units: % CORRELATE WITH CY...  Neutrophil Count, Fluid Latest Ref Range: 0 - 25 % 55 (H)  Monocyte-Macrophage-Serous Fluid Latest Ref Range: 50 - 90 % 35 (L)      OV 03/15/2018  Chief Complaint  Patient presents with  . Follow-up    Pt began taking Esbriet but had been having problems keeping food down while taking the med. Med was stopped 7/12 by MW due to the possible reaction and was told to stop med until OV with MR. Other than the beginning problems with Esbriet, pt states she has been doing good since last visit   [place] ,  84 y.o. , with dob 02/22/1931 and female ,Not Hispanic or Latino from [address] [place] [address] - presents to ILD  clinic for IPF diagnosis (he is aage > 89 (age 48), probable UIP on CT scan with honeycombing early, negative autoimmune and vasculitis serology and bronchoscopy 01/07/18 with 55% neutrophils on lavage) made 02/02/18 and esbriet decision taken 02/02/18   In terms of her IPF she is doing stable. She does not feel any worse. She gets dyspneic when walking out of her air conditioned room to the hot humid air she does not feel any worse. Walking desaturation test showed desaturation to 87% which seems a little bit worse than before but she denies that she is worse. She does not want to use portable oxygen but she is willing to get herself tested at night. There is no worsening cough or sputum production.  The main issue appears to be that on 03/05/2018 she started her first dose of Pirfenidone (Esbriet). She took one dose and felt fine and then 4 hours later immediately after lunch 12 her food and had nausea and vomiting. She does not believe that the nausea and vomiting were related to the Pirfenidone (Esbriet). She insetead thinks it is a recurrence of her chronic intermittent nausea and vomiting which is had for the last few years. We took more history on this and she tells me that she's had this for the last few years. It is stable. She says primary care physician worked up extensively. However shnever  gastroenterologist or had an endoscopy for this.   OV 04/27/2018  Chief Complaint  Patient  presents with  . Follow-up    Pt had endoscopy performed 8/27. Pt states she has been having problems with pain and states she still has occ SOB but that is better and has an occ cough in the mornings. Denies any CP.     Mikaela Hilgeman Torbeck , 85 y.o. , with dob August 24, 1931 and female ,Not Hispanic or Latino from [address] [place] [address] - presents to ILD  clinic for IPF diagnosis (he is aage > 13 (age 89), probable UIP on CT scan with honeycombing early, negative autoimmune and vasculitis serology and bronchoscopy 01/07/18 with 55% neutrophils on lavage) made 02/02/18 and esbriet decision taken 02/02/18 , restart date 03/15/18   Mechele Claude A Hassinger - presents for follow-up of her IPF. Currently she restarted her Pirfenidone (Esbriet) 03/15/2018 and escalated to 2 pills 3 times a day which she has held at that dose. She says that her fatigue is gone from mild pre-Pirfenidone (Esbriet) 2 severe post Pirfenidone (Esbriet). In addition she has weight loss. Her weight loss is 9 poundssince she started the Pirfenidone (Esbriet). She is also having extremely poor appetite. She did seeGI Dr. Barney Drain and had endoscopy 04/20/2018. According to the patientit sounds like she might have had gastritis but I'm not so sure. She's also had ultrasound of the liver and is awaiting the results. She tells me 4 days up with endoscopy she did have one episodes of nausea and vomiting. She feels that the severe D of the nausea and vomiting and the frequency of this is at baseline and she does not think the Pirfenidone (Esbriet) has made this worse. However she is categorical that the fatigue low appetite and weight loss are related to Pirfenidone (Esbriet).She is using oxygen with pulmonary rehabilitation. She is more short of breath. Walking desaturation test today shows that she did desaturate much earlier  than at baseline. Also 03/31/2018 she had overnight oxygen study and the pulse ox was less than 88%  For 7-1/2 hours. She  is refusing to wear portable oxygen. She has agreed to wear oxygen during rehabilitation and at night  OV 05/26/2018  Subjective:  Patient ID: Ashlee Martin, female , DOB: 24-Dec-1930 , age 61 y.o. , MRN: [id number] , ADDRESS: [place] [id number]   05/26/2018 -   Chief Complaint  Patient presents with  . Follow-up    PFT attempted but pt was unable to complete it. Pt has had worsening  and was admitted to the hospital 9/11-9/13 due to a UTI and O2 sats dropping to 70%. Pt states she does have a mild cough in the mornings. Denies any complaints of CP. Pt is now having to wear O2 due to her breathing worsening and is on 3L with exertion and 2L at rest.     HPI Nimo A Win 85 y.o. -presents IPF-year follow-up.  Last seen April 27, 2018.  Just before that end of August 2019 she underwent endoscopy that showed acid reflux and peptic stricture.  According to the patient she was stretched.  She has stopped as.  After the visit April 27, 2018.  Her vomiting episodes have improved although not completely resolved..  It is unclear if this is because of Pirfenidone (Esbriet) being stopped or because of the endoscopy.  In any event May 05, 2018 she was admitted at [facility].  She brought the outside records with me.  It appears that she had E. coli UTI.  In addition her BNP was high at 1600.  She had a CT scan of the chest that I was able to visualized.  There is evidence of worsening of ILD but this was in the form of a high BNP.  At this point in time she is better but somewhat fatigued.  She has been discharged with continuous oxygen although when we turned her oxygen off which was set at 3 L and turn it off for 20 minutes she had normal pulse ox at rest and desaturated at the first left.  So it appears that her hypoxemia is improved compared to admission but it is still worse compared to a visit 1 month ago.       ROS  OV 06/24/2018  Subjective:  Patient ID:  Ashlee Martin, female , DOB: 10-Apr-1931 , age 13 y.o. , MRN: [id number] , ADDRESS: [place] [id number]   06/24/2018 -   Chief Complaint  Patient presents with  . Follow-up    States her breathing has been good at her basline. Stll concerned about her weight.      HPI Danija A Bertha 85 y.o. -has idiopathic pulmonary fibrosis.  Currently on supportive care of having significant side effects suspected from Pirfenidone (Esbriet).  At this point in time she is been off Pirfenidone (Esbriet) for several weeks.  She is now gained 5 pounds of weight.  Her appetite is returned to normal.  She still has an occasional mild intermittent nausea vomiting which is her baseline idiopathic problem.  She does not have any diarrhea.  She is also started using oxygen.  Without being on Esbriet and also being on oxygen she feels her quality of life is actually improved with less shortness of breath.  She and she is less miserable.  However CT scan and pulmonary function tests show slight decline in lung function with  no fibrosis as documented below.  The CT scan was personally visualized by me.  She continues to have some cough for which she wants [employer]  We discussed nintedanib as an alternative option (see below().  We also discussed research as a care option but her low DLCO makes it difficult for her to qualify for IV or inhaler related studies that do not have GI side effects.   - OFEV  - - time to first exacerbation possibly reduced in one trial but not in another - twice daily, no titration, potentially more convenient dosing  - no need for sunscreen  - high chance of mild diarrhea but low chance of significant diarrhea needing to stop medication.   - Rx diarrhea with lomotil - slight increase in heart attack risk and theoretical increase in bleeding risk,   - need monthly blood work for 3 months and then every 6 months - monitor liver function       Results for ORVETTA, DANIELSKI (MRN [id number]) as of 06/24/2018 09:25  Ref. Range 12/29/2017 12:43 06/16/2018 10:33  FVC-Pre Latest Units: L 1.49 1.54  FVC-%Pred-Pre Latest Units: % 58 60  Results for Hauser, DACI STUBBE (MRN [id number]) as of 06/24/2018 09:25  Ref. Range 12/29/2017 12:43 06/16/2018 10:33  DLCO unc Latest Units: ml/min/mmHg 7.82 6.02  DLCO unc % pred Latest Units: % 30 23   Results for Follansbee, LUEVENIA MCAVOY (MRN [id number]) as of 06/24/2018 09:25  Ref. Range 04/27/2018 15:06  Hemoglobin Latest Ref Range: 12.0 - 15.0 g/dL 13.6   HRCT 06/15/18 Lungs/Pleura: High-resolution images again demonstrate widespread areas of septal thickening, subpleural reticulation, thickening of the peribronchovascular interstitium, traction bronchiectasis and frank honeycombing. Very mild craniocaudal gradient. Findings appear slightly progressive compared to the prior study from 11/13/2017. No confluent consolidative airspace disease. No pleural effusions. Inspiratory and expiratory imaging demonstrates some mild to moderate air trapping indicative of small airways disease. No definite suspicious appearing pulmonary nodules or masses are noted. IMPRESSION: 1. Previously noted left Tooley lobe nodule has completely resolved, indicative of an infectious or inflammatory etiology on the prior study. 2. Chronic changes of interstitial lung disease again considered probable usual interstitial pneumonia (UIP) per ATS guidelines. 3. Aortic atherosclerosis, in addition to left main and 3 vessel coronary artery disease. Please note that although the presence of coronary artery calcium documents the presence of coronary artery disease, the severity of this disease and any potential stenosis cannot be assessed on this non-gated CT examination. Assessment for potential risk factor modification, dietary therapy or pharmacologic therapy may be warranted, if clinically indicated.  Aortic Atherosclerosis (ICD10-I70.0).   Electronically  Signed   By: Vinnie Langton M.D.   On: 06/15/2018 17:00    08/05/2018  - Visit   85 year old female patient completing follow-up with our office today. Pt is currently on OFEV and patient is doing well.  Patient attributes her success on the medication to to the nursing education from the pharmaceutical company.  Patient reports she has had occasional diarrhea but this is been managed well with Lomotil.  Patient feels much better on this medication than Esbriet.  Patient denies drops in oxygen and feels that she is able to do more things around the house.  Patient thinks the medication is going well.  Patient is interested in traveling to [place] as she usually does this every year.  Patient is wondering how to coordinate this with her oxygen needs now.  Patient is wondering if she could switch her  DME company from [facility] to [employer].   Records are showing that patient is due for pneumonia vaccine.  Patient believes she has received [facility] at [facility] she will check her records as well as at [facility] in bring to next office visit.    06/15/18 -CT chest high-res- previously noted left Hosley lobe nodules completely resolved, chronic changes of interstitial lung disease again considered probable UIP   OV 09/16/2018  Subjective:  Patient ID: Ashlee Martin, female , DOB: Aug 01, 1931 , age 5 y.o. , MRN: [id number] , ADDRESS: [place] [id number]   09/16/2018 -   Chief Complaint  Patient presents with  . Follow-up    Pt states she had been doing good until 4 days ago and stated she began throwing up on Sunday and also threw up Monday night as well as Tuesday and also had diarrhea. Pt states SOB is about the same as last visit.   IPF with chronic hypoxemic respiratory failure 2 L at rest.  Failed Esbriet due to GI intolerance.  Started on nintedanib early 2019 early November  HPI Aeriel A Sandell 85 y.o. -after starting nintedanib she saw a nurse practitioner mid  December 2019.  At that time she was tolerating it well.  Liver function test was fine.  She now reports for follow-up she tells me that she has new onset of diarrhea since starting nintedanib.  This happens around 2 times a week.  It happens abruptly without any warning and she wets her bed or with her self.  Otherwise the severity itself is only mild to moderate.  But the main issue is that it happens abruptly.  She responds by taking Imodium and then she does not have diarrhea for a few days till it recurs again abruptly.  In addition for the last few days she has had intermittent nausea and vomiting.  This was not an issue with nintedanib until now.  It was a big issue with Esbriet along with weight loss which made her stop the Esbriet.  She has not tried any Zofran for this.  Otherwise in terms of her IPF she feels clinically stable.  She is bothered a lot by chronic pain in her joints.  She will talk to her primary care physician about this.    Simple office walk 185 feet x  3 laps goal with forehead probe 02/02/2018 Start esbriet 03/15/2018 contnue esbriet 04/27/2018 Stop esbriet 05/26/2018  06/24/2018   O2 used Room air Room air Room air  room air   Number laps completed _0 but desat at 2nd lap  desaturated at first lap   Comments about pace nrmal pacte Normal pace Slow pace  slow pace   Resting Pulse Ox/HR 98% and 83/min 98% and HR 85/min 96% and HR 63/min  98% and 88/min   Final Pulse Ox/HR 91% and 98/min 87% and HR 91% at end of 3rd lao 87% and HR 79 at end of 2nd lap  86% and heart rate of 95 at first lab   Desaturated </= 88% no yes Yes -  yes   Desaturated <= 3% points yes yes yes  yes   Got Tachycardic >/= 90/min yes yes no  yes   Symptoms at end of test dyspnea Mild dyspnea x  dyspnea   Miscellaneous comments Normal pace Normal pace Worse desats  worsening desaturation.  Corrected with 3 laps of walking 2 L     ROS - per  HPI     has a past medical history of Arthritis,  Depression, GERD (gastroesophageal reflux disease), Hypercholesterolemia, Hypertension, ILD (interstitial lung disease) ([place]), Psoriasis, Seizure ([place]), and Seizures ([place]).   reports that she quit smoking about 45 years ago. Her smoking use included cigarettes. She has a 60.00 pack-year smoking history. She has never used smokeless tobacco.  Past Surgical History:  Procedure Laterality Date  . BIOPSY  04/20/2018   Procedure: BIOPSY;  Surgeon: Danie Binder, MD;  Location: [facility];  Service: Endoscopy;;  gastric  . DILATION AND CURETTAGE OF UTERUS     x3  . ESOPHAGOGASTRODUODENOSCOPY (EGD) WITH PROPOFOL N/A 04/20/2018   benign-appearing, intrinsic mild stenosis, s/p dilation. Mild inflammation entire stomach, s/p biopsy that was negative for H.pylori. Pylorus normal but had changes consistent with PRIOR ulcer disease. Duodenum normal.   . SAVORY DILATION N/A 04/20/2018   Procedure: SAVORY DILATION;  Surgeon: Danie Binder, MD;  Location: [facility];  Service: Endoscopy;  Laterality: N/A;  . VIDEO BRONCHOSCOPY Bilateral 01/07/2018   Procedure: VIDEO BRONCHOSCOPY WITHOUT FLUORO;  Surgeon: Brand Males, MD;  Location: [facility];  Service: Cardiopulmonary;  Laterality: Bilateral;  . WRIST SURGERY Right    ORIF    Allergies  Allergen Reactions  . Humira [Adalimumab] Other (See Comments)    Worsened your psoriasis.  . Pirfenidone Other (See Comments)    Fatigue and weight loss    Immunization History  Administered Date(s) Administered  . Influenza, High Dose Seasonal PF 06/10/2017, 04/27/2018  . Pneumococcal Conjugate-13 12/09/2014, 09/05/2018    Family History  Problem Relation Age of Onset  . Heart disease Mother   . Heart disease Father   . Cancer Sister        GYN  . Colon cancer Neg Hx      Current Outpatient Medications:  .  atenolol (TENORMIN) 25 MG tablet, Take 25 mg by mouth 2 (two) times daily. , Disp: , Rfl:  .  doxylamine, Sleep, (UNISOM) 25 MG  tablet, Take 25 mg by mouth at bedtime. , Disp: , Rfl:  .  Histamine Dihydrochloride (AUSTRALIAN DREAM ARTHRITIS EX), Apply 1 application topically 3 (three) times daily as needed (for pain.)., Disp: , Rfl:  .  Nintedanib (OFEV) 150 MG CAPS, Take 150 mg by mouth 2 (two) times daily., Disp: , Rfl:  .  pantoprazole (PROTONIX) 40 MG tablet, Take 1 tablet (40 mg total) by mouth daily. 30 minutes before breakfast, Disp: 90 tablet, Rfl: 3 .  rOPINIRole (REQUIP) 2 MG tablet, Take 2 mg by mouth at bedtime. , Disp: , Rfl:  .  simvastatin (ZOCOR) 20 MG tablet, Take 20 mg by mouth at bedtime. , Disp: , Rfl:  .  VENTOLIN HFA 108 (90 Base) MCG/ACT inhaler, Inhale 1-2 puffs into the lungs every 6 (six) hours as needed (chest congestion/cough). , Disp: , Rfl: 0 .  vitamin B-12 (CYANOCOBALAMIN) 1000 MCG tablet, Take 1,000 mcg by mouth at bedtime. , Disp: , Rfl:  .  ondansetron (ZOFRAN) 4 MG tablet, Take 1 tablet (4 mg total) by mouth 2 (two) times daily., Disp: 60 tablet, Rfl: 0      Objective:   Vitals:   09/16/18 1057  BP: (!) 118/56  Pulse: 68  SpO2: 95%  Weight: 144 lb 12.8 oz (65.7 kg)  Height: _0  (1.651 m)    Estimated body mass index is 24.1 kg/m as calculated from the following:   Height as of this encounter: _1  (1.651  m).   Weight as of this encounter: 144 lb 12.8 oz (65.7 kg).  _0 @  [employer]   09/16/18 1057  Weight: 144 lb 12.8 oz (65.7 kg)     Physical Exam  General Appearance:    Alert, cooperative, no distress, appears stated age - yes , Deconditioned looking - no , OBESE  - no, Sitting on Wheelchair -  no  Head:    Normocephalic, without obvious abnormality, atraumatic  Eyes:    PERRL, conjunctiva/corneas clear,  Ears:    Normal TM's and external ear canals, both ears  Nose:   Nares normal, septum midline, mucosa normal, no drainage    or sinus tenderness. OXYGEN ON  - yes . Patient is @ 2L   Throat:   Lips, mucosa, and tongue normal; teeth and gums  normal. Cyanosis on lips - no  Neck:   Supple, symmetrical, trachea midline, no adenopathy;    thyroid:  no enlargement/tenderness/nodules; no carotid   bruit or JVD  Back:     Symmetric, no curvature, ROM normal, no CVA tenderness  Lungs:     Distress - no , Wheeze no, Barrell Chest - no, Purse lip breathing - no, Crackles - yes   Chest Wall:    No tenderness or deformity.    Heart:    Regular rate and rhythm, S1 and S2 normal, no rub   or gallop, Murmur - no  Breast Exam:    NOT DONE  Abdomen:     Soft, non-tender, bowel sounds active all four quadrants,    no masses, no organomegaly. Visceral obesity - no  Genitalia:   NOT DONE  Rectal:   NOT DONE  Extremities:   Extremities - normal, Has Cane - no, Clubbing - no, Edema - no  Pulses:   2+ and symmetric all extremities  Skin:   Stigmata of Connective Tissue Disease - no  Lymph nodes:   Cervical, supraclavicular, and axillary nodes normal  Psychiatric:  Neurologic:   Pleasant - yes, Anxious - no, Flat affect - no  CAm-ICU - neg, Alert and Oriented x 3 - yes, Moves all 4s - yes, Speech - normal, Cognition - intact           Assessment:       ICD-10-CM   1. IPF (idiopathic pulmonary fibrosis) ([place]) J84.112   2. Chronic respiratory failure with hypoxia (HCC) J96.11   3. Encounter for therapeutic drug monitoring Z51.81 Hepatic function panel  4. Nausea and vomiting, intractability of vomiting not specified, unspecified vomiting type R11.2   5. Drug-induced diarrhea K52.1        Plan:     Patient Instructions  IPF (idiopathic pulmonary fibrosis) (HCC) Chronic respiratory failure with hypoxia (HCC)  - clinically stable  - continue o2 2L at rest - continue ofev 154m twice daily without change but if side effects get worse will do holiday   Encounter for therapeutic drug monitoring  - check LFT 09/16/2018   Nausea and vomiting, intractability of vomiting not specified, unspecified vomiting type  - this is now back  with ofev as well though milder  - zofran 434mtwice daily as needed - 30 day supply with 1 refill  Drug-induced diarrhea  - take immodium 3 times a week (eg Monday, Wed, Friday )  - if not improved call [place]5 weeks in ILD clinic     SIGNATURE    Dr. MuBrand MalesM.D., F.C.C.P,  Pulmonary and Critical Care  Medicine Staff Physician, [place] Director - Interstitial Lung Disease  Program  P[facility] [place] [facility] [place], [place], [address]  Pager: [phone], If no answer or between  15:00h - 7:00h: call [phone] Telephone: [phone]  12:08 PM 09/16/2018

## 2018-09-16 NOTE — Patient Instructions (Addendum)
IPF (idiopathic pulmonary fibrosis) (HCC) Chronic respiratory failure with hypoxia (HCC)  - clinically stable  - continue o2 2L at rest - continue ofev 176m twice daily without change but if side effects get worse will do holiday   Encounter for therapeutic drug monitoring  - check LFT 09/16/2018   Nausea and vomiting, intractability of vomiting not specified, unspecified vomiting type  - this is now back with ofev as well though milder  - zofran 449mtwice daily as needed - 30 day supply with 1 refill  Drug-induced diarrhea  - take immodium 3 times a week (eg Monday, Wed, Friday )  - if not improved call us[place]oreaFollow[place] 4 -5 weeks in ILD clinic

## 2018-09-16 NOTE — Addendum Note (Signed)
Addended by: Lorretta Harp on: 09/16/2018 12:12 PM   Modules accepted: Orders

## 2018-09-17 ENCOUNTER — Telehealth: Payer: Self-pay | Admitting: Internal Medicine

## 2018-09-17 DIAGNOSIS — Z6824 Body mass index (BMI) 24.0-24.9, adult: Secondary | ICD-10-CM | POA: Diagnosis not present

## 2018-09-17 DIAGNOSIS — Z299 Encounter for prophylactic measures, unspecified: Secondary | ICD-10-CM | POA: Diagnosis not present

## 2018-09-17 DIAGNOSIS — I10 Essential (primary) hypertension: Secondary | ICD-10-CM | POA: Diagnosis not present

## 2018-09-17 DIAGNOSIS — R350 Frequency of micturition: Secondary | ICD-10-CM | POA: Diagnosis not present

## 2018-09-17 DIAGNOSIS — Z5181 Encounter for therapeutic drug level monitoring: Secondary | ICD-10-CM

## 2018-09-17 DIAGNOSIS — N390 Urinary tract infection, site not specified: Secondary | ICD-10-CM | POA: Diagnosis not present

## 2018-09-17 DIAGNOSIS — M549 Dorsalgia, unspecified: Secondary | ICD-10-CM | POA: Diagnosis not present

## 2018-09-17 DIAGNOSIS — J84112 Idiopathic pulmonary fibrosis: Secondary | ICD-10-CM

## 2018-09-17 NOTE — Telephone Encounter (Signed)
LFT normal in terms of enzymes but alk phos high - do not know what it means but Ashlee Martin can check repeat LFT locally in 7-10 days - [place] and we can review result  Any worsening vomit, abd pain, diarrhea with ofev Ashlee Martin should call us   Recent Labs  Lab 09/16/18 1210  AST 37  ALT 29  ALKPHOS 129*  BILITOT 0.7  PROT 7.4  ALBUMIN 3.6

## 2018-09-20 NOTE — Telephone Encounter (Signed)
Called and spoke with pt letting her know the results of the labwork and stated to her due to having high alk phos, MR wants her to repeat labwork in about a week so we can compare the results.  Stated to her she could go to [facility] to get labwork done.  Pt expressed understanding.   Labs have been ordered. Nothing further needed.

## 2018-09-21 ENCOUNTER — Encounter (HOSPITAL_COMMUNITY)
Admission: RE | Admit: 2018-09-21 | Discharge: 2018-09-21 | Disposition: A | Discharge status: Home / Self Care | Payer: Medicare HMO | Source: Ambulatory Visit | Attending: Internal Medicine | Admitting: Internal Medicine

## 2018-09-21 DIAGNOSIS — J849 Interstitial pulmonary disease, unspecified: Secondary | ICD-10-CM | POA: Diagnosis not present

## 2018-09-21 DIAGNOSIS — Z87891 Personal history of nicotine dependence: Secondary | ICD-10-CM | POA: Diagnosis not present

## 2018-09-21 DIAGNOSIS — Z7982 Long term (current) use of aspirin: Secondary | ICD-10-CM | POA: Diagnosis not present

## 2018-09-21 DIAGNOSIS — Z79899 Other long term (current) drug therapy: Secondary | ICD-10-CM | POA: Diagnosis not present

## 2018-09-21 NOTE — Progress Notes (Signed)
Daily Session Note  Patient Details  Name: MYRENE BOUGHER MRN: [id number] Date of Birth: 07-03-1931 Referring Provider:     PULMONARY REHAB OTHER RESP ORIENTATION from 04/08/2018 in [place]  Referring Provider  Ramaswamy      Encounter Date: 09/21/2018  Check In: Session Check In - 09/21/18 1330      Check-In   Supervising physician immediately available to respond to emergencies  See telemetry face sheet for immediately available MD    Location  [facility]    Staff Present  [name], Exercise Physiologist;Diane Coad, MS, EP, [facility], Exercise Physiologist    Medication changes reported      No    Fall or balance concerns reported     No    Tobacco Cessation  No Change    Warm-up and Cool-down  Performed as group-led instruction    Resistance Training Performed  Yes    VAD Patient?  No    PAD/SET Patient?  No      Pain Assessment   Currently in Pain?  No/denies    Pain Score  0-No pain    Multiple Pain Sites  No       Capillary Blood Glucose: No results found for this or any previous visit (from the past 24 hour(s)).    Social History   Tobacco Use  Smoking Status Former Smoker  . Packs/day: 3.00  . Years: 20.00  . Pack years: 60.00  . Types: Cigarettes  . Last attempt to quit: 11/03/1972  . Years since quitting: 45.9  Smokeless Tobacco Never Used    Goals Met:  Proper associated with RPD/PD & O2 Sat Independence with exercise equipment Improved SOB with ADL's Using PLB without cueing & demonstrates good technique Exercise tolerated well No report of cardiac concerns or symptoms Strength training completed today  Goals Unmet:  Not Applicable  Comments: Pt able to follow exercise prescription today without complaint.  Will continue to monitor for progression. Check out 1430.   Dr. Sinda Du is Medical Director for [facility] Pulmonary Rehab.

## 2018-09-23 ENCOUNTER — Encounter (HOSPITAL_COMMUNITY)
Admission: RE | Admit: 2018-09-23 | Discharge: 2018-09-23 | Disposition: A | Discharge status: Home / Self Care | Payer: Medicare HMO | Source: Ambulatory Visit | Attending: Internal Medicine | Admitting: Internal Medicine

## 2018-09-23 ENCOUNTER — Other Ambulatory Visit (HOSPITAL_COMMUNITY)
Admission: RE | Admit: 2018-09-23 | Discharge: 2018-09-23 | Disposition: A | Discharge status: Home / Self Care | Payer: Medicare HMO | Source: Ambulatory Visit | Attending: Internal Medicine | Admitting: Internal Medicine

## 2018-09-23 DIAGNOSIS — Z5181 Encounter for therapeutic drug level monitoring: Secondary | ICD-10-CM | POA: Diagnosis not present

## 2018-09-23 DIAGNOSIS — J849 Interstitial pulmonary disease, unspecified: Secondary | ICD-10-CM

## 2018-09-23 DIAGNOSIS — J84112 Idiopathic pulmonary fibrosis: Secondary | ICD-10-CM | POA: Diagnosis not present

## 2018-09-23 DIAGNOSIS — Z87891 Personal history of nicotine dependence: Secondary | ICD-10-CM | POA: Diagnosis not present

## 2018-09-23 DIAGNOSIS — Z79899 Other long term (current) drug therapy: Secondary | ICD-10-CM | POA: Diagnosis not present

## 2018-09-23 DIAGNOSIS — Z7982 Long term (current) use of aspirin: Secondary | ICD-10-CM | POA: Diagnosis not present

## 2018-09-23 LAB — HEPATIC FUNCTION PANEL
ALT: 19 U/L (ref 0–44)
AST: 40 U/L (ref 15–41)
Albumin: 3.6 g/dL (ref 3.5–5.0)
Alkaline Phosphatase: 96 U/L (ref 38–126)
BILIRUBIN DIRECT: 0.2 mg/dL (ref 0.0–0.2)
BILIRUBIN TOTAL: 1.1 mg/dL (ref 0.3–1.2)
Indirect Bilirubin: 0.9 mg/dL (ref 0.3–0.9)
Total Protein: 7.9 g/dL (ref 6.5–8.1)

## 2018-09-23 NOTE — Progress Notes (Signed)
Daily Session Note  Patient Details  Name: Ashlee Martin MRN: [id number] Date of Birth: 03/11/1931 Referring Provider:     PULMONARY REHAB OTHER RESP ORIENTATION from 04/08/2018 in [facility] [facility]  Referring Provider  Ramaswamy      Encounter Date: 09/23/2018  Check In: Session Check In - 09/23/18 1330      Check-In   Supervising physician immediately available to respond to emergencies  See telemetry face sheet for immediately available MD    Location  [facility]    Staff Present  Amanda Ballard, Exercise Physiologist;Diane Coad, MS, EP, [facility], Exercise Physiologist;Debra Johnson, RN, BSN    Medication changes reported      No    Fall or balance concerns reported     No    Tobacco Cessation  No Change    Warm-up and Cool-down  Performed as group-led instruction    Resistance Training Performed  Yes    VAD Patient?  No    PAD/SET Patient?  No      Pain Assessment   Currently in Pain?  No/denies    Pain Score  0-No pain    Multiple Pain Sites  No       Capillary Blood Glucose: No results found for this or any previous visit (from the past 24 hour(s)).    Social History   Tobacco Use  Smoking Status Former Smoker  . Packs/day: 3.00  . Years: 20.00  . Pack years: 60.00  . Types: Cigarettes  . Last attempt to quit: 11/03/1972  . Years since quitting: 45.9  Smokeless Tobacco Never Used    Goals Met:  Proper associated with RPD/PD & O2 Sat Independence with exercise equipment Improved SOB with ADL's Exercise tolerated well No report of cardiac concerns or symptoms Strength training completed today  Goals Unmet:  Not Applicable  Comments: Pt able to follow exercise prescription today without complaint.  Will continue to monitor for progression. Check out 1430.   Dr. Edward Hawkins is Medical Director for [place] [facility]. 

## 2018-09-24 DIAGNOSIS — I10 Essential (primary) hypertension: Secondary | ICD-10-CM | POA: Diagnosis not present

## 2018-09-24 DIAGNOSIS — Z6824 Body mass index (BMI) 24.0-24.9, adult: Secondary | ICD-10-CM | POA: Diagnosis not present

## 2018-09-24 DIAGNOSIS — Z87891 Personal history of nicotine dependence: Secondary | ICD-10-CM | POA: Diagnosis not present

## 2018-09-24 DIAGNOSIS — Z299 Encounter for prophylactic measures, unspecified: Secondary | ICD-10-CM | POA: Diagnosis not present

## 2018-09-24 DIAGNOSIS — H6121 Impacted cerumen, right ear: Secondary | ICD-10-CM | POA: Diagnosis not present

## 2018-09-25 DIAGNOSIS — R51 Headache: Secondary | ICD-10-CM | POA: Diagnosis not present

## 2018-09-25 DIAGNOSIS — J111 Influenza due to unidentified influenza virus with other respiratory manifestations: Secondary | ICD-10-CM | POA: Diagnosis not present

## 2018-09-25 DIAGNOSIS — Z87891 Personal history of nicotine dependence: Secondary | ICD-10-CM | POA: Diagnosis not present

## 2018-09-28 ENCOUNTER — Encounter (HOSPITAL_COMMUNITY)
Admission: RE | Admit: 2018-09-28 | Discharge: 2018-09-28 | Disposition: A | Discharge status: Home / Self Care | Payer: Medicare HMO | Source: Ambulatory Visit | Attending: Internal Medicine | Admitting: Internal Medicine

## 2018-09-28 DIAGNOSIS — Z87891 Personal history of nicotine dependence: Secondary | ICD-10-CM | POA: Diagnosis not present

## 2018-09-28 DIAGNOSIS — Z79899 Other long term (current) drug therapy: Secondary | ICD-10-CM | POA: Insufficient documentation

## 2018-09-28 DIAGNOSIS — J849 Interstitial pulmonary disease, unspecified: Secondary | ICD-10-CM | POA: Diagnosis not present

## 2018-09-28 DIAGNOSIS — Z7982 Long term (current) use of aspirin: Secondary | ICD-10-CM | POA: Diagnosis not present

## 2018-09-28 NOTE — Progress Notes (Signed)
Daily Session Note  Patient Details  Name: Ashlee Martin MRN: [id number] Date of Birth: Oct 16, 1930 Referring Provider:     PULMONARY REHAB OTHER RESP ORIENTATION from 04/08/2018 in [place]  Referring Provider  Ramaswamy      Encounter Date: 09/28/2018  Check In: Session Check In - 09/28/18 1330      Check-In   Supervising physician immediately available to respond to emergencies  See telemetry face sheet for immediately available MD    Location  [facility]    Staff Present  [name], Exercise Physiologist;Diane Coad, MS, EP, [facility], Exercise Physiologist    Medication changes reported      No    Fall or balance concerns reported     No    Tobacco Cessation  No Change    Warm-up and Cool-down  Performed as group-led instruction    Resistance Training Performed  Yes    VAD Patient?  No    PAD/SET Patient?  No      Pain Assessment   Currently in Pain?  No/denies    Pain Score  0-No pain    Multiple Pain Sites  No       Capillary Blood Glucose: No results found for this or any previous visit (from the past 24 hour(s)).    Social History   Tobacco Use  Smoking Status Former Smoker  . Packs/day: 3.00  . Years: 20.00  . Pack years: 60.00  . Types: Cigarettes  . Last attempt to quit: 11/03/1972  . Years since quitting: 45.9  Smokeless Tobacco Never Used    Goals Met:  Proper associated with RPD/PD & O2 Sat Independence with exercise equipment Improved SOB with ADL's Using PLB without cueing & demonstrates good technique Personal goals reviewed No report of cardiac concerns or symptoms Strength training completed today  Goals Unmet:  Not Applicable  Comments: Pt able to follow exercise prescription today without complaint.  Will continue to monitor for progression. Check out 1430.   Dr. Sinda Du is Medical Director for [facility] Pulmonary Rehab.

## 2018-09-30 ENCOUNTER — Encounter (HOSPITAL_COMMUNITY): Payer: Medicare HMO

## 2018-09-30 ENCOUNTER — Telehealth: Payer: Self-pay | Admitting: Internal Medicine

## 2018-09-30 NOTE — Progress Notes (Signed)
Pulmonary Individual Treatment Plan  Patient Details  Name: DEBANHI BLAKER MRN: [id number] Date of Birth: 1930/12/15 Referring Provider:     PULMONARY REHAB OTHER RESP ORIENTATION from 04/08/2018 in [facility]  Referring Provider  [place]      Initial Encounter Date:    PULMONARY REHAB OTHER RESP ORIENTATION from 04/08/2018 in [facility]  Date  04/08/18      Visit Diagnosis: ILD (interstitial lung disease) ([place])  Patient's Home Medications on Admission:   Current Outpatient Medications:  .  atenolol (TENORMIN) 25 MG tablet, Take 25 mg by mouth 2 (two) times daily. , Disp: , Rfl:  .  doxylamine, Sleep, (UNISOM) 25 MG tablet, Take 25 mg by mouth at bedtime. , Disp: , Rfl:  .  Histamine Dihydrochloride (AUSTRALIAN DREAM ARTHRITIS EX), Apply 1 application topically 3 (three) times daily as needed (for pain.)., Disp: , Rfl:  .  Nintedanib (OFEV) 150 MG CAPS, Take 150 mg by mouth 2 (two) times daily., Disp: , Rfl:  .  ondansetron (ZOFRAN) 4 MG tablet, Take 1 tablet (4 mg total) by mouth 2 (two) times daily as needed for nausea or vomiting., Disp: 30 tablet, Rfl: 1 .  pantoprazole (PROTONIX) 40 MG tablet, Take 1 tablet (40 mg total) by mouth daily. 30 minutes before breakfast, Disp: 90 tablet, Rfl: 3 .  rOPINIRole (REQUIP) 2 MG tablet, Take 2 mg by mouth at bedtime. , Disp: , Rfl:  .  simvastatin (ZOCOR) 20 MG tablet, Take 20 mg by mouth at bedtime. , Disp: , Rfl:  .  VENTOLIN HFA 108 (90 Base) MCG/ACT inhaler, Inhale 1-2 puffs into the lungs every 6 (six) hours as needed (chest congestion/cough). , Disp: , Rfl: 0 .  vitamin B-12 (CYANOCOBALAMIN) 1000 MCG tablet, Take 1,000 mcg by mouth at bedtime. , Disp: , Rfl:   Past Medical History: Past Medical History:  Diagnosis Date  . Arthritis   . Depression   . GERD (gastroesophageal reflux disease)   . Hypercholesterolemia   . Hypertension   . ILD (interstitial lung disease) ([place])   .  Psoriasis    had been on Humira but continued to have UTIs, sinus infections, etc. Taken off  . Seizure ([place])    had 3-4 seizures, 8 years ago, placed on medication and has had no seizures since.  . Seizures ([place])     Tobacco Use: Social History   Tobacco Use  Smoking Status Former Smoker  . Packs/day: 3.00  . Years: 20.00  . Pack years: 60.00  . Types: Cigarettes  . Last attempt to quit: 11/03/1972  . Years since quitting: 45.9  Smokeless Tobacco Never Used    Labs: Recent Review Flowsheet Data    There is no flowsheet data to display.      Capillary Blood Glucose: No results found for: GLUCAP   Pulmonary Assessment Scores: Pulmonary Assessment Scores    Row Name 04/08/18 1617         ADL UCSD   ADL Phase  Entry     SOB Score total  41     Rest  0     Walk  8     Stairs  1     Bath  1     Dress  1     Shop  3       CAT Score   CAT Score  16       mMRC Score   mMRC Score  3  Pulmonary Function Assessment: Pulmonary Function Assessment - 04/08/18 1616      Pulmonary Function Tests   FVC%  58 %    FEV1%  62 %    FEV1/FVC Ratio  108    RV%  35 %    DLCO%  30 %      Initial Spirometry Results   FVC%  58 %    FEV1%  62 %    FEV1/FVC Ratio  108      Breath   Bilateral Breath Sounds  Clear    Shortness of Breath  Yes   Mostly during walk test.       Exercise Target Goals: Exercise Program Goal: Individual exercise prescription set using results from initial 6 min walk test and THRR while considering  patient's activity barriers and safety.   Exercise Prescription Goal: Initial exercise prescription builds to 30-45 minutes a day of aerobic activity, 2-3 days per week.  Home exercise guidelines will be given to patient during program as part of exercise prescription that the participant will acknowledge.  Activity Barriers & Risk Stratification:   6 Minute Walk: 6 Minute Walk    Row Name 04/08/18 1537         6 Minute Walk    Phase  Initial     Distance  800 feet     Walk Time  6 minutes     # of Rest Breaks  1     MPH  1.51     METS  2.16     RPE  13     Perceived Dyspnea   13     Resting HR  68 bpm     Resting BP  110/62     Resting Oxygen Saturation   91 %     Exercise Oxygen Saturation  during 6 min walk  88 %     Max Ex. HR  93 bpm     Max Ex. BP  130/64     2 Minute Post BP  124/62        Oxygen Initial Assessment: Oxygen Initial Assessment - 04/08/18 1615      Home Oxygen   Home Oxygen Device  None    Sleep Oxygen Prescription  None    Home Exercise Oxygen Prescription  None    Home at Rest Exercise Oxygen Prescription  None    Compliance with Home Oxygen Use  --   N/A     Initial 6 min Walk   Oxygen Used  None      Program Oxygen Prescription   Program Oxygen Prescription  None       Oxygen Re-Evaluation: Oxygen Re-Evaluation    Row Name 05/13/18 1549 06/03/18 1503 07/01/18 1447 07/28/18 1444 08/13/18 0739     Program Oxygen Prescription   Program Oxygen Prescription  Continuous  Continuous  Continuous  Continuous  Continuous   Liters per minute  4  4 Exercise O2 at 3 to 4 L/M.  _0 FiO2%  92  90  93  95  94     Home Oxygen   Home Oxygen Device  Portable Concentrator  Portable Concentrator  Portable Concentrator  Portable Concentrator  Portable Concentrator   Sleep Oxygen Prescription  Continuous  Continuous  Continuous  Continuous  Continuous   Liters per minute  _1 Home Exercise Oxygen Prescription  Continuous  Continuous  Continuous  Continuous  Continuous   Liters per minute  _0 Home at Rest Exercise Oxygen Prescription  Continuous  Continuous  Continuous  -  Continuous   Liters per minute  _1 Compliance with Home Oxygen Use  Yes  Yes  Yes  Yes  Yes     Goals/Expected Outcomes   Short Term Goals  To learn and understand importance of monitoring SPO2 with pulse oximeter and demonstrate accurate use of the pulse oximeter.;To  learn and exhibit compliance with exercise, home and travel O2 prescription;To learn and understand importance of maintaining oxygen saturations>88%;To learn and demonstrate proper pursed lip breathing techniques or other breathing techniques.  To learn and understand importance of monitoring SPO2 with pulse oximeter and demonstrate accurate use of the pulse oximeter.;To learn and exhibit compliance with exercise, home and travel O2 prescription;To learn and understand importance of maintaining oxygen saturations>88%;To learn and demonstrate proper pursed lip breathing techniques or other breathing techniques.  To learn and understand importance of monitoring SPO2 with pulse oximeter and demonstrate accurate use of the pulse oximeter.;To learn and exhibit compliance with exercise, home and travel O2 prescription;To learn and understand importance of maintaining oxygen saturations>88%;To learn and demonstrate proper pursed lip breathing techniques or other breathing techniques.  To learn and understand importance of monitoring SPO2 with pulse oximeter and demonstrate accurate use of the pulse oximeter.;To learn and exhibit compliance with exercise, home and travel O2 prescription;To learn and understand importance of maintaining oxygen saturations>88%;To learn and demonstrate proper pursed lip breathing techniques or other breathing techniques.  To learn and understand importance of monitoring SPO2 with pulse oximeter and demonstrate accurate use of the pulse oximeter.;To learn and exhibit compliance with exercise, home and travel O2 prescription;To learn and understand importance of maintaining oxygen saturations>88%;To learn and demonstrate proper pursed lip breathing techniques or other breathing techniques.   Long  Term Goals  Exhibits compliance with exercise, home and travel O2 prescription;Verbalizes importance of monitoring SPO2 with pulse oximeter and return demonstration;Maintenance of O2  saturations>88%;Exhibits proper breathing techniques, such as pursed lip breathing or other method taught during program session  Exhibits compliance with exercise, home and travel O2 prescription;Verbalizes importance of monitoring SPO2 with pulse oximeter and return demonstration;Maintenance of O2 saturations>88%;Exhibits proper breathing techniques, such as pursed lip breathing or other method taught during program session  Exhibits compliance with exercise, home and travel O2 prescription;Verbalizes importance of monitoring SPO2 with pulse oximeter and return demonstration;Maintenance of O2 saturations>88%;Exhibits proper breathing techniques, such as pursed lip breathing or other method taught during program session  Exhibits compliance with exercise, home and travel O2 prescription;Verbalizes importance of monitoring SPO2 with pulse oximeter and return demonstration;Maintenance of O2 saturations>88%;Exhibits proper breathing techniques, such as pursed lip breathing or other method taught during program session  Exhibits compliance with exercise, home and travel O2 prescription;Verbalizes importance of monitoring SPO2 with pulse oximeter and return demonstration;Maintenance of O2 saturations>88%;Exhibits proper breathing techniques, such as pursed lip breathing or other method taught during program session   Comments  Patient was recently prescribed O2 at home. Not sure if she is supposed to use continuously or as needed. Will clarify this at her next visit. Patient demonstrates proper pursed lip breathing technique and proper usage of Pulse Oximetry and verbalizes understanding of maintaining her O2 Sat >88%. Will conitnue to monitor.   Patient was recently prescribed O2 at home. Not sure if  she is supposed to use continuously or as needed. Will clarify this at her next visit. Patient demonstrates proper pursed lip breathing technique and proper usage of Pulse Oximetry and verbalizes understanding of  maintaining her O2 Sat >88%. Will conitnue to monitor.   Patient was recently prescribed O2 at home. Not sure if she is supposed to use continuously or as needed. Will clarify this at her next visit. Patient demonstrates proper pursed lip breathing technique and proper usage of Pulse Oximetry and verbalizes understanding of maintaining her O2 Sat >88%. Will conitnue to monitor.   Patient was recently prescribed O2 at home. Not sure if she is supposed to use continuously or as needed. Will clarify this at her next visit. Patient demonstrates proper pursed lip breathing technique and proper usage of Pulse Oximetry and verbalizes understanding of maintaining her O2 Sat >88%. Will conitnue to monitor.   Patient was recently prescribed O2 at home. Not sure if she is supposed to use continuously or as needed. Will clarify this at her next visit. Patient demonstrates proper pursed lip breathing technique and proper usage of Pulse Oximetry and verbalizes understanding of maintaining her O2 Sat >88%. Will conitnue to monitor.    Goals/Expected Outcomes  Patient will continue to meet her short and long term goals.   Patient will continue to meet her short and long term goals.   Patient will continue to meet her short and long term goals.   Patient will continue to meet her short and long term goals.   Patient will continue to meet her short and long term goals.    [place] Name 09/02/18 1550 09/30/18 1307           Program Oxygen Prescription   Program Oxygen Prescription  Continuous  Continuous      Liters per minute  3  3      FiO2%  93  97        Home Oxygen   Home Oxygen Device  Portable Concentrator  Portable Concentrator      Sleep Oxygen Prescription  Continuous  Continuous      Liters per minute  2  2      Home Exercise Oxygen Prescription  Continuous  Continuous      Liters per minute  3  3      Home at Rest Exercise Oxygen Prescription  Continuous  Continuous      Liters per minute  2  2       Compliance with Home Oxygen Use  Yes  Yes        Goals/Expected Outcomes   Short Term Goals  To learn and understand importance of monitoring SPO2 with pulse oximeter and demonstrate accurate use of the pulse oximeter.;To learn and exhibit compliance with exercise, home and travel O2 prescription;To learn and understand importance of maintaining oxygen saturations>88%;To learn and demonstrate proper pursed lip breathing techniques or other breathing techniques.  To learn and understand importance of monitoring SPO2 with pulse oximeter and demonstrate accurate use of the pulse oximeter.;To learn and exhibit compliance with exercise, home and travel O2 prescription;To learn and understand importance of maintaining oxygen saturations>88%;To learn and demonstrate proper pursed lip breathing techniques or other breathing techniques.      Long  Term Goals  Exhibits compliance with exercise, home and travel O2 prescription;Verbalizes importance of monitoring SPO2 with pulse oximeter and return demonstration;Maintenance of O2 saturations>88%;Exhibits proper breathing techniques, such as pursed lip breathing or other method taught during program  session  Exhibits compliance with exercise, home and travel O2 prescription;Verbalizes importance of monitoring SPO2 with pulse oximeter and return demonstration;Maintenance of O2 saturations>88%;Exhibits proper breathing techniques, such as pursed lip breathing or other method taught during program session      Comments  Patient was recently prescribed O2 at home. Not sure if she is supposed to use continuously or as needed. Will clarify this at her next visit. Patient demonstrates proper pursed lip breathing technique and proper usage of Pulse Oximetry and verbalizes understanding of maintaining her O2 Sat >88%. Will conitnue to monitor.   Patient was recently prescribed O2 at home. Not sure if she is supposed to use continuously or as needed. Will clarify this at her next  visit. Patient demonstrates proper pursed lip breathing technique and proper usage of Pulse Oximetry and verbalizes understanding of maintaining her O2 Sat >88%. Will conitnue to monitor.       Goals/Expected Outcomes  Patient will continue to meet her short and long term goals.   Patient will continue to meet her short and long term goals.          Oxygen Discharge (Final Oxygen Re-Evaluation): Oxygen Re-Evaluation - 09/30/18 1307      Program Oxygen Prescription   Program Oxygen Prescription  Continuous    Liters per minute  3    FiO2%  97      Home Oxygen   Home Oxygen Device  Portable Concentrator    Sleep Oxygen Prescription  Continuous    Liters per minute  2    Home Exercise Oxygen Prescription  Continuous    Liters per minute  3    Home at Rest Exercise Oxygen Prescription  Continuous    Liters per minute  2    Compliance with Home Oxygen Use  Yes      Goals/Expected Outcomes   Short Term Goals  To learn and understand importance of monitoring SPO2 with pulse oximeter and demonstrate accurate use of the pulse oximeter.;To learn and exhibit compliance with exercise, home and travel O2 prescription;To learn and understand importance of maintaining oxygen saturations>88%;To learn and demonstrate proper pursed lip breathing techniques or other breathing techniques.    Long  Term Goals  Exhibits compliance with exercise, home and travel O2 prescription;Verbalizes importance of monitoring SPO2 with pulse oximeter and return demonstration;Maintenance of O2 saturations>88%;Exhibits proper breathing techniques, such as pursed lip breathing or other method taught during program session    Comments  Patient was recently prescribed O2 at home. Not sure if she is supposed to use continuously or as needed. Will clarify this at her next visit. Patient demonstrates proper pursed lip breathing technique and proper usage of Pulse Oximetry and verbalizes understanding of maintaining her O2 Sat >88%.  Will conitnue to monitor.     Goals/Expected Outcomes  Patient will continue to meet her short and long term goals.        Initial Exercise Prescription: Initial Exercise Prescription - 04/08/18 1500      Date of Initial Exercise RX and Referring Provider   Date  04/08/18    Referring Provider  Ramaswamy    Expected Discharge Date  07/24/18      Treadmill   MPH  0.8    Grade  0    Minutes  17    METs  1.6      NuStep   Level  1    SPM  42    Minutes  22    METs  1.7      Prescription Details   Frequency (times per week)  2    Duration  Progress to 30 minutes of continuous aerobic without signs/symptoms of physical distress      Intensity   THRR 40-80% of Max Heartrate  102-113-125    Ratings of Perceived Exertion  11-13    Perceived Dyspnea  0-4      Progression   Progression  Continue progressive overload as per policy without signs/symptoms or physical distress.      Resistance Training   Training Prescription  Yes    Weight  1    Reps  10-15       Perform Capillary Blood Glucose checks as needed.  Exercise Prescription Changes:  Exercise Prescription Changes    Row Name 04/22/18 0700 04/30/18 1400 05/27/18 0800 06/15/18 1400 07/01/18 1500     Response to Exercise   Blood Pressure (Admit)  132/60  102/60  102/62  144/66  120/60   Blood Pressure (Exercise)  152/68  132/68  126/64  144/66  134/60   Blood Pressure (Exit)  136/60  110/66  106/64  118/64  116/60   Heart Rate (Admit)  68 bpm  77 bpm  96 bpm  71 bpm  75 bpm   Heart Rate (Exercise)  96 bpm  79 bpm  113 bpm  84 bpm  87 bpm   Heart Rate (Exit)  76 bpm  79 bpm  103 bpm  77 bpm  97 bpm   Oxygen Saturation (Admit)  90 %  93 %  97 %  94 %  93 %   Oxygen Saturation (Exercise)  97 %  90 %  93 %  94 %  91 %   Oxygen Saturation (Exit)  93 %  93 %  97 %  90 %  90 %   Rating of Perceived Exertion (Exercise)  _0 Perceived Dyspnea (Exercise)  _1 Symptoms  - SOB  -  -  -   -   Duration  Progress to 30 minutes of  aerobic without signs/symptoms of physical distress  Progress to 30 minutes of  aerobic without signs/symptoms of physical distress  Progress to 30 minutes of  aerobic without signs/symptoms of physical distress  Progress to 30 minutes of  aerobic without signs/symptoms of physical distress  Progress to 30 minutes of  aerobic without signs/symptoms of physical distress   Intensity  THRR New 95-109-122  THRR unchanged  THRR unchanged  THRR unchanged  THRR unchanged     Progression   Progression  Continue to progress workloads to maintain intensity without signs/symptoms of physical distress.  Continue to progress workloads to maintain intensity without signs/symptoms of physical distress.  Continue to progress workloads to maintain intensity without signs/symptoms of physical distress.  Continue to progress workloads to maintain intensity without signs/symptoms of physical distress.  Continue to progress workloads to maintain intensity without signs/symptoms of physical distress.   Average METs  1.98  1.5  1.78  1.9  1.9     Resistance Training   Training Prescription  No  Yes  Yes  Yes  Yes   Weight  _2 Reps  10-15  10-15  10-15  10-15  10-15   Time  5 Minutes  5 Minutes  5 Minutes  5 Minutes  5 Minutes     Oxygen   Oxygen  -  -  Continuous  Continuous  Continuous   Liters  -  -  _0 Treadmill   MPH  0.8  0.8  1  1.2  1.3   Grade  0  0  0  0  0   Minutes  _1 METs  1.6  1.61  1.76  1.9  1.99     NuStep   Level  _2 SPM  62  84  85  85  92   Minutes  _3 METs  1.6  1.5  1.8  1.9  1.8     Home Exercise Plan   Plans to continue exercise at  -  -  -  Home (comment) walking  Home (comment)   Frequency  -  -  -  Add 3 additional days to program exercise sessions.  Add 3 additional days to program exercise sessions.   Initial Home Exercises Provided  -  -  -  04/29/18  04/29/18   Row  Name 07/16/18 1400 07/29/18 0800 08/12/18 1400 08/26/18 1400 09/08/18 1400     Response to Exercise   Blood Pressure (Admit)  114/62  114/66  128/64  126/68  122/70   Blood Pressure (Exercise)  114/60  128/60  126/70  128/60  138/68   Blood Pressure (Exit)  116/64  110/66  122/62  118/64  120/72   Heart Rate (Admit)  80 bpm  69 bpm  68 bpm  68 bpm  64 bpm   Heart Rate (Exercise)  83 bpm  81 bpm  88 bpm  82 bpm  103 bpm   Heart Rate (Exit)  82 bpm  73 bpm  75 bpm  85 bpm  83 bpm   Oxygen Saturation (Admit)  94 %  95 %  93 %  96 %  95 %   Oxygen Saturation (Exercise)  92 %  92 %  90 %  90 %  91 %   Oxygen Saturation (Exit)  98 %  95 %  94 %  98 %  91 %   Rating of Perceived Exertion (Exercise)  _4 Perceived Dyspnea (Exercise)  _5 Symptoms  -  has been having some hip pain   been out since 08/03/2018  -  -   Duration  Progress to 30 minutes of  aerobic without signs/symptoms of physical distress  Progress to 30 minutes of  aerobic without signs/symptoms of physical distress  Progress to 30 minutes of  aerobic without signs/symptoms of physical distress  Continue with 30 min of aerobic exercise without signs/symptoms of physical distress.  Continue with 30 min of aerobic exercise without signs/symptoms of physical distress.   Intensity  THRR unchanged  THRR unchanged  THRR unchanged  THRR unchanged  THRR unchanged     Progression   Progression  Continue to progress workloads to maintain intensity without signs/symptoms of physical distress.  Continue to progress workloads to maintain intensity without signs/symptoms of physical distress.  Continue to progress workloads to maintain intensity without signs/symptoms of physical distress.  Continue to progress workloads to maintain intensity without  signs/symptoms of physical distress.  Continue to progress workloads to maintain intensity without signs/symptoms of physical distress.   Average METs  1.92  1.92  1.95   1.9  2.04     Resistance Training   Training Prescription  Yes  Yes  Yes  Yes  Yes   Weight  _0 Reps  10-15  10-15  10-15  10-15  10-15   Time  5 Minutes  5 Minutes  5 Minutes  5 Minutes  5 Minutes     Oxygen   Oxygen  Continuous  Continuous  Continuous  Continuous  Continuous   Liters  _1 Treadmill   MPH  1.5  1.5  1.6  1.6  1.8   Grade  0  0  0  0  0   Minutes  _2 METs  2.14  2.14  2.22  2.2  2.37     NuStep   Level  _3 SPM  110  75  90  96  111   Minutes  _4 METs  1.8  1.7  1.7  1.6  1.7     Home Exercise Plan   Plans to continue exercise at  Home (comment)  Home (comment)  Home (comment)  Home (comment)  Home (comment)   Frequency  Add 3 additional days to program exercise sessions.  Add 3 additional days to program exercise sessions.  Add 3 additional days to program exercise sessions.  Add 3 additional days to program exercise sessions.  Add 3 additional days to program exercise sessions.   Initial Home Exercises Provided  04/29/18  04/29/18  04/29/18  04/29/18  04/29/18   Row Name 09/23/18 0800             Response to Exercise   Blood Pressure (Admit)  138/80       Blood Pressure (Exercise)  170/82       Blood Pressure (Exit)  128/70       Heart Rate (Admit)  87 bpm       Heart Rate (Exercise)  130 bpm       Heart Rate (Exit)  99 bpm       Oxygen Saturation (Admit)  90 %       Oxygen Saturation (Exercise)  93 %       Oxygen Saturation (Exit)  99 %       Rating of Perceived Exertion (Exercise)  13       Perceived Dyspnea (Exercise)  13       Comments  6 sessions left!        Duration  Continue with 30 min of aerobic exercise without signs/symptoms of physical distress.       Intensity  THRR unchanged         Progression   Progression  Continue to progress workloads to maintain intensity without signs/symptoms of physical distress.       Average METs  1.8         Resistance Training    Training Prescription  Yes       Weight  4       Reps  10-15       Time  5 Minutes         Oxygen   Oxygen  Continuous       Liters  3         Treadmill   MPH  1.8       Grade  0       Minutes  17       METs  2.37         NuStep   Level  3       SPM  81       Minutes  22       METs  1.7         Home Exercise Plan   Plans to continue exercise at  Home (comment)       Frequency  Add 3 additional days to program exercise sessions.       Initial Home Exercises Provided  04/29/18          Exercise Comments:  Exercise Comments    Row Name 04/22/18 0802 04/30/18 1454 05/14/18 0749 06/01/18 1322 06/30/18 1515   Exercise Comments  Patient is not feeling her best due to her stomach issues. She wants to come and wants to get stronger.   Patient still experiences weakness due to stomach pain but has continued to attend rehab to reach her goals.   Patient is doing better. She needs to be more consistent.   Patient has been attending regularly and seems to be getting stronger. Supplemental oxygen has helped her a lot.   Patient is back after taking last week off for doctors appointments. She is feeling stronger and says she's able to cook more for her husband.    [place] Name 07/20/18 1454 08/12/18 1457 09/02/18 0746 09/28/18 1030     Exercise Comments  Paitent has done well over the past 2 weeks. She reports feeling stronger and is excited to be able to eat more.   Pt. continues to do well in the program. She has been sick and unable to exercise since 08/03/2018. We will continue to monitor and progress as tolerated,  Pt. returned 08/19/2018. She has tolerated the eexercise well after adjusting her TM speed to bring her back up to her ability before getting sick. We will continue to monitor and progress her as we see fit.   Pt. is doing great in the program. Just 5 more sessions until she gradutates. She has joined [employer] and is excited to be able to go workout with her husband. she  states that without the program that never would've been possible.        Exercise Goals and Review:  Exercise Goals    Row Name 04/08/18 1553             Exercise Goals   Increase Physical Activity  Yes       Intervention  Provide advice, education, support and counseling about physical activity/exercise needs.;Develop an individualized exercise prescription for aerobic and resistive training based on initial evaluation findings, risk stratification, comorbidities and participant's personal goals.       Expected Outcomes  Short Term: Attend rehab on a regular basis to increase amount of physical activity.;Long Term: Add in home exercise to make exercise part of routine and to increase amount of physical activity.       Increase Strength and Stamina  Yes       Intervention  Provide advice, education, support and counseling about physical activity/exercise needs.;Develop an individualized exercise prescription for  aerobic and resistive training based on initial evaluation findings, risk stratification, comorbidities and participant's personal goals.       Expected Outcomes  Short Term: Increase workloads from initial exercise prescription for resistance, speed, and METs.;Long Term: Improve cardiorespiratory fitness, muscular endurance and strength as measured by increased METs and functional capacity (6MWT)       Able to understand and use rate of perceived exertion (RPE) scale  Yes       Intervention  Provide education and explanation on how to use RPE scale       Expected Outcomes  Short Term: Able to use RPE daily in rehab to express subjective intensity level;Long Term:  Able to use RPE to guide intensity level when exercising independently       Able to understand and use Dyspnea scale  Yes       Intervention  Provide education and explanation on how to use Dyspnea scale       Expected Outcomes  Short Term: Able to use Dyspnea scale daily in rehab to express subjective sense of shortness  of breath during exertion;Long Term: Able to use Dyspnea scale to guide intensity level when exercising independently       Knowledge and understanding of Target Heart Rate Range (THRR)  Yes       Intervention  Provide education and explanation of THRR including how the numbers were predicted and where they are located for reference       Expected Outcomes  Short Term: Able to use daily as guideline for intensity in rehab;Long Term: Able to use THRR to govern intensity when exercising independently       Able to check pulse independently  Yes       Intervention  Provide education and demonstration on how to check pulse in carotid and radial arteries.;Review the importance of being able to check your own pulse for safety during independent exercise       Expected Outcomes  Short Term: Able to explain why pulse checking is important during independent exercise;Long Term: Able to check pulse independently and accurately       Understanding of Exercise Prescription  Yes       Intervention  Provide education, explanation, and written materials on patient's individual exercise prescription       Expected Outcomes  Short Term: Able to explain program exercise prescription;Long Term: Able to explain home exercise prescription to exercise independently          Exercise Goals Re-Evaluation : Exercise Goals Re-Evaluation    Row Name 04/22/18 0752 04/30/18 1452 05/14/18 0745 06/01/18 1320 06/30/18 1513     Exercise Goal Re-Evaluation   Exercise Goals Review  Increase Physical Activity;Increase Strength and Stamina;Able to understand and use Dyspnea scale;Understanding of Exercise Prescription;Knowledge and understanding of Target Heart Rate Range (THRR)  Increase Physical Activity;Increase Strength and Stamina;Able to understand and use Dyspnea scale;Understanding of Exercise Prescription;Knowledge and understanding of Target Heart Rate Range (THRR)  Increase Physical Activity;Increase Strength and  Stamina;Able to understand and use Dyspnea scale;Understanding of Exercise Prescription;Knowledge and understanding of Target Heart Rate Range (THRR)  Increase Physical Activity;Increase Strength and Stamina;Able to understand and use Dyspnea scale;Understanding of Exercise Prescription;Knowledge and understanding of Target Heart Rate Range (THRR)  Increase Physical Activity;Increase Strength and Stamina;Able to understand and use Dyspnea scale;Understanding of Exercise Prescription;Knowledge and understanding of Target Heart Rate Range (THRR)   Comments  Patient is still new to the program. She is having some stomach issues that  makes it difficult for her to eat. When she comes she is sometimes weak because of not eating. She is seeing her doctor to determine what the problem is. She has not been progressed yet due to having doctors appointments about her stomach. She does like coming to the program and hopes it will help her reach her goals.   Patient has had 4 visits so far. She is still weak due to her stomach pains but will hopefully have those issues resolved soon.   Patient has had 6 visits so far. She is still weak due to her last episode of having urinary track infection. She is on an antibiotics and they have added supplemental  oxygen. Will clarify the liter flow on her next visit.   Patient has been doing well in the program since being place on oxygen. She is working out on [employer]. She seems to be getting around better with it although she doesn't want it. Will continue to monitor and progress.   Patient continues to do well in the program. She has adjusted to having her oxygen full time and now sees the benefit. She is handling increases on boththe treadmill and nustep well.    Expected Outcomes  Get energy back, iIncrease appetite and continue to get stronger.   Get energy back, iIncrease appetite and continue to get stronger.   Get energy back, iIncrease appetite and continue to get stronger.    Increase energy levels, appetite and get stronger.   Increase energy levels, appetite and get stronger.    [place] Name 07/20/18 1453 08/12/18 1456 09/02/18 0745 09/28/18 1029       Exercise Goal Re-Evaluation   Exercise Goals Review  Increase Physical Activity;Increase Strength and Stamina;Able to understand and use Dyspnea scale;Understanding of Exercise Prescription;Knowledge and understanding of Target Heart Rate Range (THRR)  Increase Physical Activity;Increase Strength and Stamina;Able to understand and use Dyspnea scale;Understanding of Exercise Prescription;Knowledge and understanding of Target Heart Rate Range (THRR)  Increase Physical Activity;Increase Strength and Stamina;Able to understand and use Dyspnea scale;Understanding of Exercise Prescription;Knowledge and understanding of Target Heart Rate Range (THRR)  Increase Physical Activity;Increase Strength and Stamina;Able to understand and use Dyspnea scale;Understanding of Exercise Prescription;Knowledge and understanding of Target Heart Rate Range (THRR)    Comments  Patient is doing well in the program. She is now walking 1.48mh on the treadmill with ease. She has regained her appetite and has been able to put on a few pounds. We will continue to progress her as we see fit.   Pt. continues to due well in the program. She has been out due to sickness. Upon return we will continue to monitor and progress as tolerated.   Pt. has been able to return to the program. She is back to working hard and tolerating the exercise well. We have readjusted her speed on the TM to get her back to where she was before getting sick. We will continue to progress her as needed, when she's ready.   Pt. is doing great in the program. She has attended 31 sessions so just 5 more until graduation. She has gotten so much stronger and is visibly able to do more. She is excited to graduate and get in to her own exercise routine.     Expected Outcomes  Increase energy levels and  stamina.   Increase energy levels and stamina.   Increase energy levels and stamina.   Increase energy levels and stamina.  Discharge Exercise Prescription (Final Exercise Prescription Changes): Exercise Prescription Changes - 09/23/18 0800      Response to Exercise   Blood Pressure (Admit)  138/80    Blood Pressure (Exercise)  170/82    Blood Pressure (Exit)  128/70    Heart Rate (Admit)  87 bpm    Heart Rate (Exercise)  130 bpm    Heart Rate (Exit)  99 bpm    Oxygen Saturation (Admit)  90 %    Oxygen Saturation (Exercise)  93 %    Oxygen Saturation (Exit)  99 %    Rating of Perceived Exertion (Exercise)  13    Perceived Dyspnea (Exercise)  13    Comments  6 sessions left!     Duration  Continue with 30 min of aerobic exercise without signs/symptoms of physical distress.    Intensity  THRR unchanged      Progression   Progression  Continue to progress workloads to maintain intensity without signs/symptoms of physical distress.    Average METs  1.8      Resistance Training   Training Prescription  Yes    Weight  4    Reps  10-15    Time  5 Minutes      Oxygen   Oxygen  Continuous    Liters  3      Treadmill   MPH  1.8    Grade  0    Minutes  17    METs  2.37      NuStep   Level  3    SPM  81    Minutes  22    METs  1.7      Home Exercise Plan   Plans to continue exercise at  Home (comment)    Frequency  Add 3 additional days to program exercise sessions.    Initial Home Exercises Provided  04/29/18       Nutrition:  Target Goals: Understanding of nutrition guidelines, daily intake of sodium <153m, cholesterol <2055m calories 30% from fat and 7% or less from saturated fats, daily to have 5 or more servings of fruits and vegetables.  Biometrics: Pre Biometrics - 04/08/18 1610      Pre Biometrics   Height  _0  (1.651 m)    Weight  69.3 kg    Waist Circumference  26 inches    Hip Circumference  29 inches    Waist to Hip Ratio  0.9 %    BMI  (Calculated)  25.41    Triceps Skinfold  6 mm    % Body Fat  15 %    Grip Strength  32 kg    Flexibility  0 in    Single Leg Stand  10 seconds        Nutrition Therapy Plan and Nutrition Goals: Nutrition Therapy & Goals - 09/30/18 1308      Nutrition Therapy   RD appointment deferred  Yes      Personal Nutrition Goals   Comments  Patient did not attend the February RD appointment. Patient has lost 4 lbs since last 30 day review. She is trying to gain weight and continues to say her appetite is improving. She is eating more since the program started. Will continue to monitor for progress.       Intervention Plan   Intervention  Nutrition handout(s) given to patient.       Nutrition Assessments: Nutrition Assessments - 04/08/18 1620      MEDFICTS  Scores   Pre Score  27       Nutrition Goals Re-Evaluation:   Nutrition Goals Discharge (Final Nutrition Goals Re-Evaluation):   Psychosocial: Target Goals: Acknowledge presence or absence of significant depression and/or stress, maximize coping skills, provide positive support system. Participant is able to verbalize types and ability to use techniques and skills needed for reducing stress and depression.  Initial Review & Psychosocial Screening: Initial Psych Review & Screening - 04/08/18 1612      Initial Review   Current issues with  None Identified      Family Dynamics   Good Support System?  Yes      Barriers   Psychosocial barriers to participate in program  There are no identifiable barriers or psychosocial needs.      Screening Interventions   Interventions  Encouraged to exercise    Expected Outcomes  Short Term goal: Identification and review with participant of any Quality of Life or Depression concerns found by scoring the questionnaire.;Long Term goal: The participant improves quality of Life and PHQ9 Scores as seen by post scores and/or verbalization of changes       Quality of Life Scores: Quality of  Life - 04/08/18 1613      Quality of Life   Select  Quality of Life      Quality of Life Scores   Health/Function Pre  20.38 %    Socioeconomic Pre  19.71 %    Psych/Spiritual Pre  20.14 %    Family Pre  20.6 %    GLOBAL Pre  20.23 %      Scores of 19 and below usually indicate a poorer quality of life in these areas.  A difference of  2-3 points is a clinically meaningful difference.  A difference of 2-3 points in the total score of the Quality of Life Index has been associated with significant improvement in overall quality of life, self-image, physical symptoms, and general health in studies assessing change in quality of life.   PHQ-9: Recent Review Flowsheet Data    Depression screen [facility] 2/9 04/08/2018   Decreased Interest 2   Down, Depressed, Hopeless 1   PHQ - 2 Score 3   Altered sleeping 0   Tired, decreased energy 1   Change in appetite 1   Feeling bad or failure about yourself  1   Trouble concentrating 1   Moving slowly or fidgety/restless 0   Suicidal thoughts 0   PHQ-9 Score 7   Difficult doing work/chores Somewhat difficult     Interpretation of Total Score  Total Score Depression Severity:  1-4 = Minimal depression, 5-9 = Mild depression, 10-14 = Moderate depression, 15-19 = Moderately severe depression, 20-27 = Severe depression   Psychosocial Evaluation and Intervention: Psychosocial Evaluation - 04/08/18 1613      Psychosocial Evaluation & Interventions   Interventions  Encouraged to exercise with the program and follow exercise prescription    Continue Psychosocial Services   No Follow up required       Psychosocial Re-Evaluation: Psychosocial Re-Evaluation    [place] Name 05/13/18 1557 06/03/18 1511 07/01/18 1450 07/28/18 1448 08/13/18 0744     Psychosocial Re-Evaluation   Current issues with  Current Depression  Current Depression  Current Depression  Current Depression  Current Depression   Comments  Patient's initial QOL score was 20.33 and her  PHQ-9 score was 7. She says she is depressed but refuses any treatment. She feels she is able to manage it  on her own.   Patient's initial QOL score was 20.33 and her PHQ-9 score was 7. She says she feels her depression has improved especially since her appetite is improving.   Patient's initial QOL score was 20.33 and her PHQ-9 score was 7. She says she feels her depression continues to improve especially since her appetite is improving.   Patient's initial QOL score was 20.33 and her PHQ-9 score was 7. She says she feels her depression continues to improve especially since her appetite is improving.   Patient's initial QOL score was 20.33 and her PHQ-9 score was 7. She says she feels her depression continues to improve especially since her appetite is improving.    Expected Outcomes  Patient's QOL and PHQ-9 score will improve and she will verbalize improvement in her depression at discharge.   Patient's QOL and PHQ-9 score will improve and she will verbalize improvement in her depression at discharge.   Patient's QOL and PHQ-9 score will improve and she will verbalize improvement in her depression at discharge.   Patient's QOL and PHQ-9 score will improve and she will verbalize improvement in her depression at discharge.   Patient's QOL and PHQ-9 score will improve and she will verbalize improvement in her depression at discharge.    Interventions  Stress management education;Encouraged to attend Pulmonary Rehabilitation for the exercise;Relaxation education  Stress management education;Encouraged to attend Pulmonary Rehabilitation for the exercise;Relaxation education  Stress management education;Encouraged to attend Pulmonary Rehabilitation for the exercise;Relaxation education  Stress management education;Encouraged to attend Pulmonary Rehabilitation for the exercise;Relaxation education  Stress management education;Encouraged to attend Pulmonary Rehabilitation for the exercise;Relaxation education    Continue Psychosocial Services   Follow up required by staff  Follow up required by staff  Follow up required by staff  Follow up required by staff  Follow up required by staff   [place] Name 09/02/18 1554 09/30/18 1313           Psychosocial Re-Evaluation   Current issues with  Current Depression  Current Depression      Comments  Patient's initial QOL score was 20.33 and her PHQ-9 score was 7. She says she feels her depression continues to improve especially since her appetite is improving.   Patient continues to say her depression is improving as her physical health and breathing continue to improve. Will continue to monitor for progress.       Expected Outcomes  Patient's QOL and PHQ-9 score will improve and she will verbalize improvement in her depression at discharge.   Patient's QOL and PHQ-9 score will improve and she will verbalize improvement in her depression at discharge.       Interventions  Stress management education;Encouraged to attend Pulmonary Rehabilitation for the exercise;Relaxation education  Stress management education;Encouraged to attend Pulmonary Rehabilitation for the exercise;Relaxation education      Continue Psychosocial Services   Follow up required by staff  Follow up required by staff         Psychosocial Discharge (Final Psychosocial Re-Evaluation): Psychosocial Re-Evaluation - 09/30/18 1313      Psychosocial Re-Evaluation   Current issues with  Current Depression    Comments  Patient continues to say her depression is improving as her physical health and breathing continue to improve. Will continue to monitor for progress.     Expected Outcomes  Patient's QOL and PHQ-9 score will improve and she will verbalize improvement in her depression at discharge.     Interventions  Stress management education;Encouraged to  attend Pulmonary Rehabilitation for the exercise;Relaxation education    Continue Psychosocial Services   Follow up required by staff         Education: Education Goals: Education classes will be provided on a weekly basis, covering required topics. Participant will state understanding/return demonstration of topics presented.  Learning Barriers/Preferences: Learning Barriers/Preferences - 04/08/18 1520      Learning Barriers/Preferences   Learning Barriers  None    Learning Preferences  Skilled Demonstration;Group Instruction;Verbal Instruction       Education Topics: How Lungs Work and Diseases: - Discuss the anatomy of the lungs and diseases that can affect the lungs, such as COPD.   PULMONARY REHAB OTHER RESPIRATORY from 09/23/2018 in [place]  Date  07/01/18  Educator  DWynetta Emery  Instruction Review Code  2- Demonstrated Understanding      Exercise: -Discuss the importance of exercise, FITT principles of exercise, normal and abnormal responses to exercise, and how to exercise safely.   Environmental Irritants: -Discuss types of environmental irritants and how to limit exposure to environmental irritants.   PULMONARY REHAB OTHER RESPIRATORY from 09/23/2018 in [place]  Date  07/08/18  Educator  D. Coad  Instruction Review Code  2- Demonstrated Understanding      Meds/Inhalers and oxygen: - Discuss respiratory medications, definition of an inhaler and oxygen, and the proper way to use an inhaler and oxygen.   PULMONARY REHAB OTHER RESPIRATORY from 09/23/2018 in [place]  Date  04/15/18  Educator  Etheleen Mayhew      Energy Saving Techniques: - Discuss methods to conserve energy and decrease shortness of breath when performing activities of daily living.    Bronchial Hygiene / Breathing Techniques: - Discuss breathing mechanics, pursed-lip breathing technique,  proper posture, effective ways to clear airways, and other functional breathing techniques   PULMONARY REHAB OTHER RESPIRATORY from 09/23/2018 in [place]  Date  04/29/18  Educator  Etheleen Mayhew  Instruction Review Code  2- Demonstrated Understanding      Cleaning Equipment: - Provides group verbal and written instruction about the health risks of elevated stress, cause of high stress, and healthy ways to reduce stress.   Nutrition I: Fats: - Discuss the types of cholesterol, what cholesterol does to the body, and how cholesterol levels can be controlled.   Nutrition II: Labels: -Discuss the different components of food labels and how to read food labels.   PULMONARY REHAB OTHER RESPIRATORY from 09/23/2018 in [place]  Date  05/20/18  Educator  Etheleen Mayhew  Instruction Review Code  2- Demonstrated Understanding      Respiratory Infections: - Discuss the signs and symptoms of respiratory infections, ways to prevent respiratory infections, and the importance of seeking medical treatment when having a respiratory infection.   PULMONARY REHAB OTHER RESPIRATORY from 09/23/2018 in [place]  Date  06/03/18  Educator  Etheleen Mayhew  Instruction Review Code  2- Demonstrated Understanding      Stress I: Signs and Symptoms: - Discuss the causes of stress, how stress may lead to anxiety and depression, and ways to limit stress.   PULMONARY REHAB OTHER RESPIRATORY from 09/23/2018 in [place]  Date  06/10/18  Educator  Etheleen Mayhew  Instruction Review Code  2- Demonstrated Understanding      Stress II: Relaxation: -Discuss relaxation techniques to limit stress.   PULMONARY REHAB OTHER RESPIRATORY from 09/23/2018 in [place]  REHABILITATION  Date  06/17/18  Educator  Etheleen Mayhew  Instruction Review Code  2- Demonstrated Understanding      Oxygen for Home/Travel: - Discuss how to prepare for travel when on oxygen and proper ways to transport and store oxygen to ensure safety.   PULMONARY REHAB OTHER RESPIRATORY from 09/23/2018 in [place]  Date  09/23/18  Educator  MB  Instruction Review Code  2- Demonstrated Understanding      Knowledge Questionnaire Score: Knowledge Questionnaire Score - 04/08/18 1621      Knowledge Questionnaire Score   Pre Score  12/18       Core Components/Risk Factors/Patient Goals at Admission: Personal Goals and Risk Factors at Admission - 04/08/18 1621      Core Components/Risk Factors/Patient Goals on Admission    Weight Management  Weight Maintenance    Personal Goal Other  Yes    Personal Goal  Gain energy, increase appetite, continue to get stronger through PR program    Intervention  Attend class 2 x week, supplement with 3 x week exercise at home.     Expected Outcomes  Reach personal goals.        Core Components/Risk Factors/Patient Goals Review:  Goals and Risk Factor Review    Row Name 05/13/18 1553 06/03/18 1506 07/01/18 1449 07/28/18 1445 08/13/18 0741     Core Components/Risk Factors/Patient Goals Review   Personal Goals Review  Improve shortness of breath with ADL's;Other Get energy back; increase appetite.   Improve shortness of breath with ADL's;Other Get energy back; increase appetite.   Improve shortness of breath with ADL's;Other Get energy back; increase appetitie.   Improve shortness of breath with ADL's;Other Get energy back; increase appetite.   Improve shortness of breath with ADL's;Other Get energy back; increase appetite.    Review  Patient has completed 6 sessions losing 4 lbs since her initial visit. Her attendance has been inconsistent due to illness, MD appointments and procedures. She recently has a UTI and was prescribed oxygen therapy. Her progress in the program has been impeded by her inconsistent attendance. Will continue to monitor for progress.   Patient has completed 11 sessions maintaining her weight since last 30 day review; however, she says she has gained 4 lbs on her scales in recent weeks. Her appetite has improved and she is cooking  more now. Her gastroenterologist adjusted her medications treating her nausea. She does feel better. She says she does feel stronger and is breathing better since they put her on O2. Will continue to monitor for progress.   Patient has completed 17 sessions maintaining her weight since last 30 day review. She says her appetite continues to improve. She is doing well in the program with progression. She says she feels stronger and has more stamina. She is pleased with her progress in the program. Will continue to monitor for progress.   Patient has completed 21 sessions maintaining her weight since last 30 day review. Her appetite continues to improve. She continues to do well in the program with progression. She continues to say she feels stronger and has more energy. She says she feels less SOB and attributes this to the program and being on oxygen. Will continue to monitor for progress.   Patient has completed 23 sessions losing 2 lbs since last 30 day review. Her appetite continues to improve. She is doing well in the program with progression. She has been out for the past few sessions due to having  a stomach virus. She says she is feeling like she is getting her energy back and does feel stronger. Will continue to monitor for progress.    Expected Outcomes  Patient will continue to attend sessions and complete the program and meet her personal goals.   Patient will continue to attend sessions and complete the program and meet her personal goals.   Patient will continue to attend sessions and complete the program and meet her personal goals.   Patient will continue to attend sessions and complete the program and meet her personal goals.   Patient will continue to attend sessions and complete the program and meet her personal goals.    [place] Name 09/02/18 1551 09/30/18 1311           Core Components/Risk Factors/Patient Goals Review   Personal Goals Review  Improve shortness of breath with ADL's;Other Get  energy back; increase appetite.   Improve shortness of breath with ADL's;Other Get energy back; increase appetite.       Review  Patient has completed 27 sessions losing 2 lbs since last 30 day review. She says she has more energy and feels like doing more around the house. She has missed some sessions recently due to a GI virus. She says her weakness has subsided from this and she feels more like herself again. She is pleased with her progress in the program. Will continue to monitor.   Patient has completed 32 sessions losint 4 lbs since last 30 day review. She continues to do well in the program with progressionl. She states her appetite is much better and her breathing has improved 100%. She also says she has more energy. She is very pleased with her progress in the program. Will continue to monitor for progress.       Expected Outcomes  Patient will continue to attend sessions and complete the program and meet her personal goals.   Patient will continue to attend sessions and complete the program and meet her personal goals.          Core Components/Risk Factors/Patient Goals at Discharge (Final Review):  Goals and Risk Factor Review - 09/30/18 1311      Core Components/Risk Factors/Patient Goals Review   Personal Goals Review  Improve shortness of breath with ADL's;Other   Get energy back; increase appetite.    Review  Patient has completed 32 sessions losint 4 lbs since last 30 day review. She continues to do well in the program with progressionl. She states her appetite is much better and her breathing has improved 100%. She also says she has more energy. She is very pleased with her progress in the program. Will continue to monitor for progress.     Expected Outcomes  Patient will continue to attend sessions and complete the program and meet her personal goals.        ITP Comments: ITP Comments    Row Name 04/22/18 1555           ITP Comments  Patient is new to the program. She has  completed 3 sessions. She has been out for this week due to having an endoscopy procedure done on 04/20/18 with 2 biopsy's taken and dilation of esophagus with wide spread inflammation found of her stomach. Also had liver scan with fatty liver. Will have a follow up liver scan in 3 to 6 months. Will continue to monitor for progress.           Comments: ITP REVIEW Pt  is making expected progress toward pulmonary rehab goals after completing 32 sessions. Recommend continued exercise, life style modification, education, and utilization of breathing techniques to increase stamina and strength and decrease shortness of breath with exertion.

## 2018-09-30 NOTE — Telephone Encounter (Signed)
Called and spoke with pt letting her know the results of labwork. Pt expressed understanding. Nothing further needed.

## 2018-10-05 ENCOUNTER — Encounter (HOSPITAL_COMMUNITY)
Admission: RE | Admit: 2018-10-05 | Discharge: 2018-10-05 | Disposition: A | Discharge status: Home / Self Care | Payer: Medicare HMO | Source: Ambulatory Visit | Attending: Internal Medicine | Admitting: Internal Medicine

## 2018-10-05 DIAGNOSIS — Z7982 Long term (current) use of aspirin: Secondary | ICD-10-CM | POA: Diagnosis not present

## 2018-10-05 DIAGNOSIS — Z79899 Other long term (current) drug therapy: Secondary | ICD-10-CM | POA: Diagnosis not present

## 2018-10-05 DIAGNOSIS — J849 Interstitial pulmonary disease, unspecified: Secondary | ICD-10-CM | POA: Diagnosis not present

## 2018-10-05 DIAGNOSIS — Z87891 Personal history of nicotine dependence: Secondary | ICD-10-CM | POA: Diagnosis not present

## 2018-10-05 NOTE — Progress Notes (Signed)
Daily Session Note  Patient Details  Name: REDINA ZELLER MRN: [id number] Date of Birth: March 20, 1931 Referring Provider:     PULMONARY REHAB OTHER RESP ORIENTATION from 04/08/2018 in [place]  Referring Provider  Ramaswamy      Encounter Date: 10/05/2018  Check In: Session Check In - 10/05/18 1330      Check-In   Supervising physician immediately available to respond to emergencies  See telemetry face sheet for immediately available MD    Location  [facility]    Staff Present  [name], Exercise Physiologist;Diane Coad, MS, EP, [facility], Exercise Physiologist    Medication changes reported      No    Fall or balance concerns reported     No    Tobacco Cessation  No Change    Warm-up and Cool-down  Performed as group-led instruction    Resistance Training Performed  Yes    VAD Patient?  No    PAD/SET Patient?  No      Pain Assessment   Currently in Pain?  No/denies    Pain Score  0-No pain    Multiple Pain Sites  No       Capillary Blood Glucose: No results found for this or any previous visit (from the past 24 hour(s)).    Social History   Tobacco Use  Smoking Status Former Smoker  . Packs/day: 3.00  . Years: 20.00  . Pack years: 60.00  . Types: Cigarettes  . Last attempt to quit: 11/03/1972  . Years since quitting: 45.9  Smokeless Tobacco Never Used    Goals Met:  Proper associated with RPD/PD & O2 Sat Independence with exercise equipment Exercise tolerated well No report of cardiac concerns or symptoms Strength training completed today  Goals Unmet:  Not Applicable  Comments: Pt able to follow exercise prescription today without complaint.  Will continue to monitor for progression. Check out 1430.   Dr. Sinda Du is Medical Director for [facility] Pulmonary Re[facility].

## 2018-10-07 ENCOUNTER — Encounter (HOSPITAL_COMMUNITY)
Admission: RE | Admit: 2018-10-07 | Discharge: 2018-10-07 | Disposition: A | Discharge status: Home / Self Care | Payer: Medicare HMO | Source: Ambulatory Visit | Attending: Internal Medicine | Admitting: Internal Medicine

## 2018-10-07 DIAGNOSIS — J849 Interstitial pulmonary disease, unspecified: Secondary | ICD-10-CM | POA: Diagnosis not present

## 2018-10-07 DIAGNOSIS — Z79899 Other long term (current) drug therapy: Secondary | ICD-10-CM | POA: Diagnosis not present

## 2018-10-07 DIAGNOSIS — M1611 Unilateral primary osteoarthritis, right hip: Secondary | ICD-10-CM | POA: Diagnosis not present

## 2018-10-07 DIAGNOSIS — Z7982 Long term (current) use of aspirin: Secondary | ICD-10-CM | POA: Diagnosis not present

## 2018-10-07 DIAGNOSIS — R0902 Hypoxemia: Secondary | ICD-10-CM | POA: Diagnosis not present

## 2018-10-07 DIAGNOSIS — Z87891 Personal history of nicotine dependence: Secondary | ICD-10-CM | POA: Diagnosis not present

## 2018-10-07 DIAGNOSIS — Z96649 Presence of unspecified artificial hip joint: Secondary | ICD-10-CM | POA: Diagnosis not present

## 2018-10-07 NOTE — Progress Notes (Signed)
Daily Session Note  Patient Details  Name: Ashlee Martin MRN: [id number] Date of Birth: 11-27-1930 Referring Provider:     PULMONARY REHAB OTHER RESP ORIENTATION from 04/08/2018 in [place]  Referring Provider  Ramaswamy      Encounter Date: 10/07/2018  Check In: Session Check In - 10/07/18 1330      Check-In   Supervising physician immediately available to respond to emergencies  See telemetry face sheet for immediately available MD    Location  [facility]    Staff Present  [name], Exercise Physiologist;Debra Wynetta Emery, RN, BSN    Medication changes reported      No    Fall or balance concerns reported     No    Tobacco Cessation  No Change    Warm-up and Cool-down  Performed as group-led instruction    Resistance Training Performed  Yes    VAD Patient?  No    PAD/SET Patient?  No      Pain Assessment   Currently in Pain?  No/denies    Pain Score  0-No pain    Multiple Pain Sites  No       Capillary Blood Glucose: No results found for this or any previous visit (from the past 24 hour(s)).    Social History   Tobacco Use  Smoking Status Former Smoker  . Packs/day: 3.00  . Years: 20.00  . Pack years: 60.00  . Types: Cigarettes  . Last attempt to quit: 11/03/1972  . Years since quitting: 45.9  Smokeless Tobacco Never Used    Goals Met:  Proper associated with RPD/PD & O2 Sat Independence with exercise equipment Improved SOB with ADL's Exercise tolerated well No report of cardiac concerns or symptoms Strength training completed today  Goals Unmet:  Not Applicable  Comments: Pt able to follow exercise prescription today without complaint.  Will continue to monitor for progression. Check out 1430.   Dr. Sinda Du is Medical Director for [facility] Pulmonary Rehab.

## 2018-10-12 ENCOUNTER — Encounter (HOSPITAL_COMMUNITY)
Admission: RE | Admit: 2018-10-12 | Discharge: 2018-10-12 | Disposition: A | Discharge status: Home / Self Care | Payer: Medicare HMO | Source: Ambulatory Visit | Attending: Internal Medicine | Admitting: Internal Medicine

## 2018-10-12 VITALS — Ht 65.0 in | Wt 140.2 lb

## 2018-10-12 DIAGNOSIS — Z79899 Other long term (current) drug therapy: Secondary | ICD-10-CM | POA: Diagnosis not present

## 2018-10-12 DIAGNOSIS — J849 Interstitial pulmonary disease, unspecified: Secondary | ICD-10-CM

## 2018-10-12 DIAGNOSIS — Z87891 Personal history of nicotine dependence: Secondary | ICD-10-CM | POA: Diagnosis not present

## 2018-10-12 DIAGNOSIS — Z7982 Long term (current) use of aspirin: Secondary | ICD-10-CM | POA: Diagnosis not present

## 2018-10-12 NOTE — Progress Notes (Signed)
Daily Session Note  Patient Details  Name: SELESTE TALLMAN MRN: [id number] Date of Birth: 10/05/1930 Referring Provider:     PULMONARY REHAB OTHER RESP ORIENTATION from 04/08/2018 in [place]  Referring Provider  Ramaswamy      Encounter Date: 10/12/2018  Check In: Session Check In - 10/12/18 1330      Check-In   Supervising physician immediately available to respond to emergencies  See telemetry face sheet for immediately available MD    Location  [facility]    Staff Present  [name], Exercise Physiologist;Diane Coad, MS, EP, [facility], Exercise Physiologist    Medication changes reported      No    Fall or balance concerns reported     No    Tobacco Cessation  No Change    Warm-up and Cool-down  Performed as group-led instruction    Resistance Training Performed  Yes    VAD Patient?  No    PAD/SET Patient?  No      Pain Assessment   Currently in Pain?  No/denies    Pain Score  0-No pain    Multiple Pain Sites  No       Capillary Blood Glucose: No results found for this or any previous visit (from the past 24 hour(s)).    Social History   Tobacco Use  Smoking Status Former Smoker  . Packs/day: 3.00  . Years: 20.00  . Pack years: 60.00  . Types: Cigarettes  . Last attempt to quit: 11/03/1972  . Years since quitting: 45.9  Smokeless Tobacco Never Used    Goals Met:  Proper associated with RPD/PD & O2 Sat Independence with exercise equipment Using PLB without cueing & demonstrates good technique Exercise tolerated well No report of cardiac concerns or symptoms Strength training completed today  Goals Unmet:  Not Applicable  Comments: Pt able to follow exercise prescription today without complaint.  Will continue to monitor for progression. Check out 1430.   Dr. Sinda Du is Medical Director for [facility] Pul[facility] Re[facility].

## 2018-10-14 ENCOUNTER — Encounter (HOSPITAL_COMMUNITY): Payer: Medicare HMO

## 2018-10-19 ENCOUNTER — Encounter (HOSPITAL_COMMUNITY)
Admission: RE | Admit: 2018-10-19 | Discharge: 2018-10-19 | Disposition: A | Discharge status: Home / Self Care | Payer: Medicare HMO | Source: Ambulatory Visit | Attending: Internal Medicine | Admitting: Internal Medicine

## 2018-10-19 DIAGNOSIS — Z7982 Long term (current) use of aspirin: Secondary | ICD-10-CM | POA: Diagnosis not present

## 2018-10-19 DIAGNOSIS — J849 Interstitial pulmonary disease, unspecified: Secondary | ICD-10-CM | POA: Diagnosis not present

## 2018-10-19 DIAGNOSIS — Z79899 Other long term (current) drug therapy: Secondary | ICD-10-CM | POA: Diagnosis not present

## 2018-10-19 DIAGNOSIS — Z87891 Personal history of nicotine dependence: Secondary | ICD-10-CM | POA: Diagnosis not present

## 2018-10-19 NOTE — Progress Notes (Signed)
Daily Session Note  Patient Details  Name: Ashlee Martin MRN: [id number] Date of Birth: 12/10/1930 Referring Provider:     PULMONARY REHAB OTHER RESP ORIENTATION from 04/08/2018 in [place]  Referring Provider  Ramaswamy      Encounter Date: 10/19/2018  Check In: Session Check In - 10/19/18 1330      Check-In   Supervising physician immediately available to respond to emergencies  See telemetry face sheet for immediately available MD    Location  [facility]    Staff Present  [name], Exercise Physiologist;Diane Coad, MS, EP, [facility], Exercise Physiologist;Other    Fall or balance concerns reported     No    Tobacco Cessation  No Change    Warm-up and Cool-down  Performed as group-led instruction    Resistance Training Performed  Yes    VAD Patient?  No    PAD/SET Patient?  No      Pain Assessment   Currently in Pain?  No/denies    Pain Score  0-No pain    Multiple Pain Sites  No       Capillary Blood Glucose: No results found for this or any previous visit (from the past 24 hour(s)).    Social History   Tobacco Use  Smoking Status Former Smoker  . Packs/day: 3.00  . Years: 20.00  . Pack years: 60.00  . Types: Cigarettes  . Last attempt to quit: 11/03/1972  . Years since quitting: 45.9  Smokeless Tobacco Never Used    Goals Met:  Proper associated with RPD/PD & O2 Sat Independence with exercise equipment Using PLB without cueing & demonstrates good technique Exercise tolerated well No report of cardiac concerns or symptoms Strength training completed today  Goals Unmet:  Not Applicable  Comments: Pt able to follow exercise prescription today without complaint.  Will continue to monitor for progression. Check out 1430.   Dr. Sinda Du is Medical Director for [facility].

## 2018-10-21 ENCOUNTER — Encounter (HOSPITAL_COMMUNITY): Payer: Medicare HMO

## 2018-10-21 NOTE — Progress Notes (Signed)
Pulmonary Individual Treatment Plan  Patient Details  Name: Ashlee Martin MRN: [id number] Date of Birth: 1931-03-05 Referring Provider:     PULMONARY REHAB OTHER RESP ORIENTATION from 04/08/2018 in [place]  Referring Provider  Schedler [place]      Initial Encounter Date:    PULMONARY REHAB OTHER RESP ORIENTATION from 04/08/2018 in [place]  Date  04/08/18      Visit Diagnosis: ILD (interstitial lung disease) ([place])  Patient's Home Medications on Admission:   Current Outpatient Medications:  .  atenolol (TENORMIN) 25 MG tablet, Take 25 mg by mouth 2 (two) times daily. , Disp: , Rfl:  .  doxylamine, Sleep, (UNISOM) 25 MG tablet, Take 25 mg by mouth at bedtime. , Disp: , Rfl:  .  Histamine Dihydrochloride (AUSTRALIAN DREAM ARTHRITIS EX), Apply 1 application topically 3 (three) times daily as needed (for pain.)., Disp: , Rfl:  .  Nintedanib (OFEV) 150 MG CAPS, Take 150 mg by mouth 2 (two) times daily., Disp: , Rfl:  .  ondansetron (ZOFRAN) 4 MG tablet, Take 1 tablet (4 mg total) by mouth 2 (two) times daily as needed for nausea or vomiting., Disp: 30 tablet, Rfl: 1 .  pantoprazole (PROTONIX) 40 MG tablet, Take 1 tablet (40 mg total) by mouth daily. 30 minutes before breakfast, Disp: 90 tablet, Rfl: 3 .  rOPINIRole (REQUIP) 2 MG tablet, Take 2 mg by mouth at bedtime. , Disp: , Rfl:  .  simvastatin (ZOCOR) 20 MG tablet, Take 20 mg by mouth at bedtime. , Disp: , Rfl:  .  VENTOLIN HFA 108 (90 Base) MCG/ACT inhaler, Inhale 1-2 puffs into the lungs every 6 (six) hours as needed (chest congestion/cough). , Disp: , Rfl: 0 .  vitamin B-12 (CYANOCOBALAMIN) 1000 MCG tablet, Take 1,000 mcg by mouth at bedtime. , Disp: , Rfl:   Past Medical History: Past Medical History:  Diagnosis Date  . Arthritis   . Depression   . GERD (gastroesophageal reflux disease)   . Hypercholesterolemia   . Hypertension   . ILD (interstitial lung disease) ([place])   .  Psoriasis    had been on Humira but continued to have UTIs, sinus infections, etc. Taken off  . Seizure ([place])    had 3-4 seizures, 8 years ago, placed on medication and has had no seizures since.  . Seizures ([place])     Tobacco Use: Social History   Tobacco Use  Smoking Status Former Smoker  . Packs/day: 3.00  . Years: 20.00  . Pack years: 60.00  . Types: Cigarettes  . Last attempt to quit: 11/03/1972  . Years since quitting: 45.9  Smokeless Tobacco Never Used    Labs: Recent Review Flowsheet Data    There is no flowsheet data to display.      Capillary Blood Glucose: No results found for: GLUCAP   Pulmonary Assessment Scores: Pulmonary Assessment Scores    Row Name 10/21/18 1533         ADL UCSD   ADL Phase  Exit     SOB Score total  9     Rest  0     Walk  1     Stairs  1     Bath  0     Dress  0     Shop  0       CAT Score   CAT Score  14       mMRC Score   mMRC Score  2  Pulmonary Function Assessment:   Exercise Target Goals: Exercise Program Goal: Individual exercise prescription set using results from initial 6 min walk test and THRR while considering  patient's activity barriers and safety.   Exercise Prescription Goal: Initial exercise prescription builds to 30-45 minutes a day of aerobic activity, 2-3 days per week.  Home exercise guidelines will be given to patient during program as part of exercise prescription that the participant will acknowledge.  Activity Barriers & Risk Stratification:   6 Minute Walk: 6 Minute Walk    Row Name 10/12/18 1504         6 Minute Walk   Phase  Discharge     Distance  1000 feet     Distance % Change  25 %     Distance Feet Change  200 ft     Walk Time  6 minutes     # of Rest Breaks  0     MPH  1.89     METS  2.45     RPE  11     Perceived Dyspnea   6     VO2 Peak  5.87     Symptoms  Yes (comment)     Comments  4/10 back pain which pt thinks is a side effect of arthritis, subsided  with rest.      Resting HR  93 bpm     Resting BP  122/64     Resting Oxygen Saturation   98 %     Exercise Oxygen Saturation  during 6 min walk  88 %     Max Ex. HR  86 bpm     Max Ex. BP  140/60     2 Minute Post BP  118/62        Oxygen Initial Assessment:   Oxygen Re-Evaluation: Oxygen Re-Evaluation    Row Name 05/13/18 1549 06/03/18 1503 07/01/18 1447 07/28/18 1444 08/13/18 0739     Program Oxygen Prescription   Program Oxygen Prescription  Continuous  Continuous  Continuous  Continuous  Continuous   Liters per minute  4  4 Exercise O2 at 3 to 4 L/M.  _0 FiO2%  92  90  93  95  94     Home Oxygen   Home Oxygen Device  Portable Concentrator  Portable Concentrator  Portable Concentrator  Portable Concentrator  Portable Concentrator   Sleep Oxygen Prescription  Continuous  Continuous  Continuous  Continuous  Continuous   Liters per minute  _1 Home Exercise Oxygen Prescription  Continuous  Continuous  Continuous  Continuous  Continuous   Liters per minute  _2 Home at Rest Exercise Oxygen Prescription  Continuous  Continuous  Continuous  -  Continuous   Liters per minute  _3 Compliance with Home Oxygen Use  Yes  Yes  Yes  Yes  Yes     Goals/Expected Outcomes   Short Term Goals  To learn and understand importance of monitoring SPO2 with pulse oximeter and demonstrate accurate use of the pulse oximeter.;To learn and exhibit compliance with exercise, home and travel O2 prescription;To learn and understand importance of maintaining oxygen saturations>88%;To learn and demonstrate proper pursed lip breathing techniques or other breathing techniques.  To learn and understand importance of monitoring SPO2 with pulse oximeter and  demonstrate accurate use of the pulse oximeter.;To learn and exhibit compliance with exercise, home and travel O2 prescription;To learn and understand importance of maintaining oxygen saturations>88%;To learn and  demonstrate proper pursed lip breathing techniques or other breathing techniques.  To learn and understand importance of monitoring SPO2 with pulse oximeter and demonstrate accurate use of the pulse oximeter.;To learn and exhibit compliance with exercise, home and travel O2 prescription;To learn and understand importance of maintaining oxygen saturations>88%;To learn and demonstrate proper pursed lip breathing techniques or other breathing techniques.  To learn and understand importance of monitoring SPO2 with pulse oximeter and demonstrate accurate use of the pulse oximeter.;To learn and exhibit compliance with exercise, home and travel O2 prescription;To learn and understand importance of maintaining oxygen saturations>88%;To learn and demonstrate proper pursed lip breathing techniques or other breathing techniques.  To learn and understand importance of monitoring SPO2 with pulse oximeter and demonstrate accurate use of the pulse oximeter.;To learn and exhibit compliance with exercise, home and travel O2 prescription;To learn and understand importance of maintaining oxygen saturations>88%;To learn and demonstrate proper pursed lip breathing techniques or other breathing techniques.   Long  Term Goals  Exhibits compliance with exercise, home and travel O2 prescription;Verbalizes importance of monitoring SPO2 with pulse oximeter and return demonstration;Maintenance of O2 saturations>88%;Exhibits proper breathing techniques, such as pursed lip breathing or other method taught during program session  Exhibits compliance with exercise, home and travel O2 prescription;Verbalizes importance of monitoring SPO2 with pulse oximeter and return demonstration;Maintenance of O2 saturations>88%;Exhibits proper breathing techniques, such as pursed lip breathing or other method taught during program session  Exhibits compliance with exercise, home and travel O2 prescription;Verbalizes importance of monitoring SPO2 with  pulse oximeter and return demonstration;Maintenance of O2 saturations>88%;Exhibits proper breathing techniques, such as pursed lip breathing or other method taught during program session  Exhibits compliance with exercise, home and travel O2 prescription;Verbalizes importance of monitoring SPO2 with pulse oximeter and return demonstration;Maintenance of O2 saturations>88%;Exhibits proper breathing techniques, such as pursed lip breathing or other method taught during program session  Exhibits compliance with exercise, home and travel O2 prescription;Verbalizes importance of monitoring SPO2 with pulse oximeter and return demonstration;Maintenance of O2 saturations>88%;Exhibits proper breathing techniques, such as pursed lip breathing or other method taught during program session   Comments  Patient was recently prescribed O2 at home. Not sure if she is supposed to use continuously or as needed. Will clarify this at her next visit. Patient demonstrates proper pursed lip breathing technique and proper usage of Pulse Oximetry and verbalizes understanding of maintaining her O2 Sat >88%. Will conitnue to monitor.   Patient was recently prescribed O2 at home. Not sure if she is supposed to use continuously or as needed. Will clarify this at her next visit. Patient demonstrates proper pursed lip breathing technique and proper usage of Pulse Oximetry and verbalizes understanding of maintaining her O2 Sat >88%. Will conitnue to monitor.   Patient was recently prescribed O2 at home. Not sure if she is supposed to use continuously or as needed. Will clarify this at her next visit. Patient demonstrates proper pursed lip breathing technique and proper usage of Pulse Oximetry and verbalizes understanding of maintaining her O2 Sat >88%. Will conitnue to monitor.   Patient was recently prescribed O2 at home. Not sure if she is supposed to use continuously or as needed. Will clarify this at her next visit. Patient demonstrates  proper pursed lip breathing technique and proper usage of Pulse Oximetry and verbalizes understanding of maintaining  her O2 Sat >88%. Will conitnue to monitor.   Patient was recently prescribed O2 at home. Not sure if she is supposed to use continuously or as needed. Will clarify this at her next visit. Patient demonstrates proper pursed lip breathing technique and proper usage of Pulse Oximetry and verbalizes understanding of maintaining her O2 Sat >88%. Will conitnue to monitor.    Goals/Expected Outcomes  Patient will continue to meet her short and long term goals.   Patient will continue to meet her short and long term goals.   Patient will continue to meet her short and long term goals.   Patient will continue to meet her short and long term goals.   Patient will continue to meet her short and long term goals.    [place] Name 09/02/18 1550 09/30/18 1307           Program Oxygen Prescription   Program Oxygen Prescription  Continuous  Continuous      Liters per minute  3  3      FiO2%  93  97        Home Oxygen   Home Oxygen Device  Portable Concentrator  Portable Concentrator      Sleep Oxygen Prescription  Continuous  Continuous      Liters per minute  2  2      Home Exercise Oxygen Prescription  Continuous  Continuous      Liters per minute  3  3      Home at Rest Exercise Oxygen Prescription  Continuous  Continuous      Liters per minute  2  2      Compliance with Home Oxygen Use  Yes  Yes        Goals/Expected Outcomes   Short Term Goals  To learn and understand importance of monitoring SPO2 with pulse oximeter and demonstrate accurate use of the pulse oximeter.;To learn and exhibit compliance with exercise, home and travel O2 prescription;To learn and understand importance of maintaining oxygen saturations>88%;To learn and demonstrate proper pursed lip breathing techniques or other breathing techniques.  To learn and understand importance of monitoring SPO2 with pulse oximeter and  demonstrate accurate use of the pulse oximeter.;To learn and exhibit compliance with exercise, home and travel O2 prescription;To learn and understand importance of maintaining oxygen saturations>88%;To learn and demonstrate proper pursed lip breathing techniques or other breathing techniques.      Long  Term Goals  Exhibits compliance with exercise, home and travel O2 prescription;Verbalizes importance of monitoring SPO2 with pulse oximeter and return demonstration;Maintenance of O2 saturations>88%;Exhibits proper breathing techniques, such as pursed lip breathing or other method taught during program session  Exhibits compliance with exercise, home and travel O2 prescription;Verbalizes importance of monitoring SPO2 with pulse oximeter and return demonstration;Maintenance of O2 saturations>88%;Exhibits proper breathing techniques, such as pursed lip breathing or other method taught during program session      Comments  Patient was recently prescribed O2 at home. Not sure if she is supposed to use continuously or as needed. Will clarify this at her next visit. Patient demonstrates proper pursed lip breathing technique and proper usage of Pulse Oximetry and verbalizes understanding of maintaining her O2 Sat >88%. Will conitnue to monitor.   Patient was recently prescribed O2 at home. Not sure if she is supposed to use continuously or as needed. Will clarify this at her next visit. Patient demonstrates proper pursed lip breathing technique and proper usage of Pulse Oximetry and verbalizes understanding of  maintaining her O2 Sat >88%. Will conitnue to monitor.       Goals/Expected Outcomes  Patient will continue to meet her short and long term goals.   Patient will continue to meet her short and long term goals.          Oxygen Discharge (Final Oxygen Re-Evaluation): Oxygen Re-Evaluation - 09/30/18 1307      Program Oxygen Prescription   Program Oxygen Prescription  Continuous    Liters per minute  3     FiO2%  97      Home Oxygen   Home Oxygen Device  Portable Concentrator    Sleep Oxygen Prescription  Continuous    Liters per minute  2    Home Exercise Oxygen Prescription  Continuous    Liters per minute  3    Home at Rest Exercise Oxygen Prescription  Continuous    Liters per minute  2    Compliance with Home Oxygen Use  Yes      Goals/Expected Outcomes   Short Term Goals  To learn and understand importance of monitoring SPO2 with pulse oximeter and demonstrate accurate use of the pulse oximeter.;To learn and exhibit compliance with exercise, home and travel O2 prescription;To learn and understand importance of maintaining oxygen saturations>88%;To learn and demonstrate proper pursed lip breathing techniques or other breathing techniques.    Long  Term Goals  Exhibits compliance with exercise, home and travel O2 prescription;Verbalizes importance of monitoring SPO2 with pulse oximeter and return demonstration;Maintenance of O2 saturations>88%;Exhibits proper breathing techniques, such as pursed lip breathing or other method taught during program session    Comments  Patient was recently prescribed O2 at home. Not sure if she is supposed to use continuously or as needed. Will clarify this at her next visit. Patient demonstrates proper pursed lip breathing technique and proper usage of Pulse Oximetry and verbalizes understanding of maintaining her O2 Sat >88%. Will conitnue to monitor.     Goals/Expected Outcomes  Patient will continue to meet her short and long term goals.        Initial Exercise Prescription:   Perform Capillary Blood Glucose checks as needed.  Exercise Prescription Changes:  Exercise Prescription Changes    Row Name 04/30/18 1400 05/27/18 0800 06/15/18 1400 07/01/18 1500 07/16/18 1400     Response to Exercise   Blood Pressure (Admit)  102/60  102/62  144/66  120/60  114/62   Blood Pressure (Exercise)  132/68  126/64  144/66  134/60  114/60   Blood Pressure  (Exit)  110/66  106/64  118/64  116/60  116/64   Heart Rate (Admit)  77 bpm  96 bpm  71 bpm  75 bpm  80 bpm   Heart Rate (Exercise)  79 bpm  113 bpm  84 bpm  87 bpm  83 bpm   Heart Rate (Exit)  79 bpm  103 bpm  77 bpm  97 bpm  82 bpm   Oxygen Saturation (Admit)  93 %  97 %  94 %  93 %  94 %   Oxygen Saturation (Exercise)  90 %  93 %  94 %  91 %  92 %   Oxygen Saturation (Exit)  93 %  97 %  90 %  90 %  98 %   Rating of Perceived Exertion (Exercise)  _0 Perceived Dyspnea (Exercise)  _1 12  Duration  Progress to 30 minutes of  aerobic without signs/symptoms of physical distress  Progress to 30 minutes of  aerobic without signs/symptoms of physical distress  Progress to 30 minutes of  aerobic without signs/symptoms of physical distress  Progress to 30 minutes of  aerobic without signs/symptoms of physical distress  Progress to 30 minutes of  aerobic without signs/symptoms of physical distress   Intensity  THRR unchanged  THRR unchanged  THRR unchanged  THRR unchanged  THRR unchanged     Progression   Progression  Continue to progress workloads to maintain intensity without signs/symptoms of physical distress.  Continue to progress workloads to maintain intensity without signs/symptoms of physical distress.  Continue to progress workloads to maintain intensity without signs/symptoms of physical distress.  Continue to progress workloads to maintain intensity without signs/symptoms of physical distress.  Continue to progress workloads to maintain intensity without signs/symptoms of physical distress.   Average METs  1.5  1.78  1.9  1.9  1.92     Resistance Training   Training Prescription  Yes  Yes  Yes  Yes  Yes   Weight  _0 Reps  10-15  10-15  10-15  10-15  10-15   Time  5 Minutes  5 Minutes  5 Minutes  5 Minutes  5 Minutes     Oxygen   Oxygen  -  Continuous  Continuous  Continuous  Continuous   Liters  -  _1 Treadmill   MPH  0.8  1  1.2   1.3  1.5   Grade  0  0  0  0  0   Minutes  _2 METs  1.61  1.76  1.9  1.99  2.14     NuStep   Level  _3 SPM  84  85  85  92  110   Minutes  _4 METs  1.5  1.8  1.9  1.8  1.8     Home Exercise Plan   Plans to continue exercise at  -  -  Home (comment) walking  Home (comment)  Home (comment)   Frequency  -  -  Add 3 additional days to program exercise sessions.  Add 3 additional days to program exercise sessions.  Add 3 additional days to program exercise sessions.   Initial Home Exercises Provided  -  -  04/29/18  04/29/18  04/29/18   Row Name 07/29/18 0800 08/12/18 1400 08/26/18 1400 09/08/18 1400 09/23/18 0800     Response to Exercise   Blood Pressure (Admit)  114/66  128/64  126/68  122/70  138/80   Blood Pressure (Exercise)  128/60  126/70  128/60  138/68  170/82   Blood Pressure (Exit)  110/66  122/62  118/64  120/72  128/70   Heart Rate (Admit)  69 bpm  68 bpm  68 bpm  64 bpm  87 bpm   Heart Rate (Exercise)  81 bpm  88 bpm  82 bpm  103 bpm  130 bpm   Heart Rate (Exit)  73 bpm  75 bpm  85 bpm  83 bpm  99 bpm   Oxygen Saturation (Admit)  95 %  93 %  96 %  95 %  90 %   Oxygen Saturation (Exercise)  92 %  90 %  90 %  91 %  93 %   Oxygen Saturation (Exit)  95 %  94 %  98 %  91 %  99 %   Rating of Perceived Exertion (Exercise)  _0 Perceived Dyspnea (Exercise)  _1 Symptoms  has been having some hip pain   been out since 08/03/2018  -  -  -   Comments  -  -  -  -  6 sessions left!    Duration  Progress to 30 minutes of  aerobic without signs/symptoms of physical distress  Progress to 30 minutes of  aerobic without signs/symptoms of physical distress  Continue with 30 min of aerobic exercise without signs/symptoms of physical distress.  Continue with 30 min of aerobic exercise without signs/symptoms of physical distress.  Continue with 30 min of aerobic exercise without signs/symptoms of physical distress.    Intensity  THRR unchanged  THRR unchanged  THRR unchanged  THRR unchanged  THRR unchanged     Progression   Progression  Continue to progress workloads to maintain intensity without signs/symptoms of physical distress.  Continue to progress workloads to maintain intensity without signs/symptoms of physical distress.  Continue to progress workloads to maintain intensity without signs/symptoms of physical distress.  Continue to progress workloads to maintain intensity without signs/symptoms of physical distress.  Continue to progress workloads to maintain intensity without signs/symptoms of physical distress.   Average METs  1.92  1.95  1.9  2.04  1.8     Resistance Training   Training Prescription  Yes  Yes  Yes  Yes  Yes   Weight  _2 Reps  10-15  10-15  10-15  10-15  10-15   Time  5 Minutes  5 Minutes  5 Minutes  5 Minutes  5 Minutes     Oxygen   Oxygen  Continuous  Continuous  Continuous  Continuous  Continuous   Liters  _3 Treadmill   MPH  1.5  1.6  1.6  1.8  1.8   Grade  0  0  0  0  0   Minutes  _4 METs  2.14  2.22  2.2  2.37  2.37     NuStep   Level  _5 SPM  75  90  96  111  81   Minutes  _6 METs  1.7  1.7  1.6  1.7  1.7     Home Exercise Plan   Plans to continue exercise at  Home (comment)  Home (comment)  Home (comment)  Home (comment)  Home (comment)   Frequency  Add 3 additional days to program exercise sessions.  Add 3 additional days to program exercise sessions.  Add 3 additional days to program exercise sessions.  Add 3 additional days to program exercise sessions.  Add 3 additional days to program exercise sessions.   Initial Home Exercises Provided  04/29/18  04/29/18  04/29/18  04/29/18  04/29/18   Row Name 10/05/18 0800  Response to Exercise   Blood Pressure (Admit)  118/62       Blood Pressure (Exercise)  144/60       Blood Pressure (Exit)  116/60       Heart Rate  (Admit)  68 bpm       Heart Rate (Exercise)  75 bpm       Heart Rate (Exit)  71 bpm       Oxygen Saturation (Admit)  92 %       Oxygen Saturation (Exercise)  97 %       Oxygen Saturation (Exit)  95 %       Rating of Perceived Exertion (Exercise)  13       Perceived Dyspnea (Exercise)  13       Duration  Continue with 30 min of aerobic exercise without signs/symptoms of physical distress.       Intensity  THRR unchanged         Progression   Progression  Continue to progress workloads to maintain intensity without signs/symptoms of physical distress.       Average METs  1.9         Resistance Training   Training Prescription  Yes       Weight  4       Reps  10-15       Time  5 Minutes         Oxygen   Oxygen  Continuous       Liters  3         Treadmill   MPH  1.7 was not feeling well due to some medication        Grade  0       Minutes  17       METs  2.3         NuStep   Level  2       SPM  70       Minutes  22       METs  1.7         Home Exercise Plan   Plans to continue exercise at  Home (comment)       Frequency  Add 3 additional days to program exercise sessions.       Initial Home Exercises Provided  04/29/18          Exercise Comments:  Exercise Comments    Row Name 04/30/18 1454 05/14/18 0749 06/01/18 1322 06/30/18 1515 07/20/18 1454   Exercise Comments  Patient still experiences weakness due to stomach pain but has continued to attend rehab to reach her goals.   Patient is doing better. She needs to be more consistent.   Patient has been attending regularly and seems to be getting stronger. Supplemental oxygen has helped her a lot.   Patient is back after taking last week off for doctors appointments. She is feeling stronger and says she's able to cook more for her husband.   Paitent has done well over the past 2 weeks. She reports feeling stronger and is excited to be able to eat more.    [place] Name 08/12/18 1457 09/02/18 0746 09/28/18 1030 10/05/18 0823  10/19/18 1535   Exercise Comments  Pt. continues to do well in the program. She has been sick and unable to exercise since 08/03/2018. We will continue to monitor and progress as tolerated,  Pt. returned 08/19/2018. She has tolerated the eexercise well after adjusting her TM speed to  bring her back up to her ability before getting sick. We will continue to monitor and progress her as we see fit.   Pt. is doing great in the program. Just 5 more sessions until she gradutates. She has joined [employer] and is excited to be able to go workout with her husband. she states that without the program that never would've been possible.   At patients last visit she was taking Tamiflu preventatively and didn't feel her best so she only walked on the TM for a short time. She will return to her typical levels and times depending on how she feels.   Pt. graduated today from Pulmonary Rehab with 36 sessions completed.  Details of the patient's exercise prescription and what she needs to do in order to continue the prescription and progress were discussed with patient.  Patient was given a copy of prescription and goals.  Patient verbalized understanding.  Jamieson plans to continue to exercise by joining [employer] in [place].      Exercise Goals and Review:   Exercise Goals Re-Evaluation : Exercise Goals Re-Evaluation    Row Name 04/30/18 1452 05/14/18 0745 06/01/18 1320 06/30/18 1513 07/20/18 1453     Exercise Goal Re-Evaluation   Exercise Goals Review  Increase Physical Activity;Increase Strength and Stamina;Able to understand and use Dyspnea scale;Understanding of Exercise Prescription;Knowledge and understanding of Target Heart Rate Range (THRR)  Increase Physical Activity;Increase Strength and Stamina;Able to understand and use Dyspnea scale;Understanding of Exercise Prescription;Knowledge and understanding of Target Heart Rate Range (THRR)  Increase Physical Activity;Increase Strength and Stamina;Able to  understand and use Dyspnea scale;Understanding of Exercise Prescription;Knowledge and understanding of Target Heart Rate Range (THRR)  Increase Physical Activity;Increase Strength and Stamina;Able to understand and use Dyspnea scale;Understanding of Exercise Prescription;Knowledge and understanding of Target Heart Rate Range (THRR)  Increase Physical Activity;Increase Strength and Stamina;Able to understand and use Dyspnea scale;Understanding of Exercise Prescription;Knowledge and understanding of Target Heart Rate Range (THRR)   Comments  Patient has had 4 visits so far. She is still weak due to her stomach pains but will hopefully have those issues resolved soon.   Patient has had 6 visits so far. She is still weak due to her last episode of having urinary track infection. She is on an antibiotics and they have added supplemental  oxygen. Will clarify the liter flow on her next visit.   Patient has been doing well in the program since being place on oxygen. She is working out on [employer]. She seems to be getting around better with it although she doesn't want it. Will continue to monitor and progress.   Patient continues to do well in the program. She has adjusted to having her oxygen full time and now sees the benefit. She is handling increases on boththe treadmill and nustep well.   Patient is doing well in the program. She is now walking 1.71mh on the treadmill with ease. She has regained her appetite and has been able to put on a few pounds. We will continue to progress her as we see fit.    Expected Outcomes  Get energy back, iIncrease appetite and continue to get stronger.   Get energy back, iIncrease appetite and continue to get stronger.   Increase energy levels, appetite and get stronger.   Increase energy levels, appetite and get stronger.   Increase energy levels and stamina.    RCrandallName 08/12/18 1456 09/02/18 0745 09/28/18 1029  Exercise Goal Re-Evaluation   Exercise Goals Review  Increase  Physical Activity;Increase Strength and Stamina;Able to understand and use Dyspnea scale;Understanding of Exercise Prescription;Knowledge and understanding of Target Heart Rate Range (THRR)  Increase Physical Activity;Increase Strength and Stamina;Able to understand and use Dyspnea scale;Understanding of Exercise Prescription;Knowledge and understanding of Target Heart Rate Range (THRR)  Increase Physical Activity;Increase Strength and Stamina;Able to understand and use Dyspnea scale;Understanding of Exercise Prescription;Knowledge and understanding of Target Heart Rate Range (THRR)     Comments  Pt. continues to due well in the program. She has been out due to sickness. Upon return we will continue to monitor and progress as tolerated.   Pt. has been able to return to the program. She is back to working hard and tolerating the exercise well. We have readjusted her speed on the TM to get her back to where she was before getting sick. We will continue to progress her as needed, when she's ready.   Pt. is doing great in the program. She has attended 31 sessions so just 5 more until graduation. She has gotten so much stronger and is visibly able to do more. She is excited to graduate and get in to her own exercise routine.      Expected Outcomes  Increase energy levels and stamina.   Increase energy levels and stamina.   Increase energy levels and stamina.         Discharge Exercise Prescription (Final Exercise Prescription Changes): Exercise Prescription Changes - 10/05/18 0800      Response to Exercise   Blood Pressure (Admit)  118/62    Blood Pressure (Exercise)  144/60    Blood Pressure (Exit)  116/60    Heart Rate (Admit)  68 bpm    Heart Rate (Exercise)  75 bpm    Heart Rate (Exit)  71 bpm    Oxygen Saturation (Admit)  92 %    Oxygen Saturation (Exercise)  97 %    Oxygen Saturation (Exit)  95 %    Rating of Perceived Exertion (Exercise)  13    Perceived Dyspnea (Exercise)  13    Duration   Continue with 30 min of aerobic exercise without signs/symptoms of physical distress.    Intensity  THRR unchanged      Progression   Progression  Continue to progress workloads to maintain intensity without signs/symptoms of physical distress.    Average METs  1.9      Resistance Training   Training Prescription  Yes    Weight  4    Reps  10-15    Time  5 Minutes      Oxygen   Oxygen  Continuous    Liters  3      Treadmill   MPH  1.7   was not feeling well due to some medication    Grade  0    Minutes  17    METs  2.3      NuStep   Level  2    SPM  70    Minutes  22    METs  1.7      Home Exercise Plan   Plans to continue exercise at  Home (comment)    Frequency  Add 3 additional days to program exercise sessions.    Initial Home Exercises Provided  04/29/18       Nutrition:  Target Goals: Understanding of nutrition guidelines, daily intake of sodium <1532m, cholesterol <2054m calories 30% from fat and 7%  or less from saturated fats, daily to have 5 or more servings of fruits and vegetables.  Biometrics:  Post Biometrics - 10/12/18 1509       Post  Biometrics   Height  _0  (1.651 m)    Weight  63.6 kg    Waist Circumference  37.5 inches    Hip Circumference  38 inches    Waist to Hip Ratio  0.99 %    BMI (Calculated)  23.33    Triceps Skinfold  13 mm    % Body Fat  34.8 %    Grip Strength  32 kg    Flexibility  0 in    Single Leg Stand  10 seconds       Nutrition Therapy Plan and Nutrition Goals: Nutrition Therapy & Goals - 09/30/18 1308      Nutrition Therapy   RD appointment deferred  Yes      Personal Nutrition Goals   Comments  Patient did not attend the February RD appointment. Patient has lost 4 lbs since last 30 day review. She is trying to gain weight and continues to say her appetite is improving. She is eating more since the program started. Will continue to monitor for progress.       Intervention Plan   Intervention  Nutrition  handout(s) given to patient.       Nutrition Assessments: Nutrition Assessments - 10/21/18 1535      MEDFICTS Scores   Pre Score  27    Post Score  24    Score Difference  -3       Nutrition Goals Re-Evaluation:   Nutrition Goals Discharge (Final Nutrition Goals Re-Evaluation):   Psychosocial: Target Goals: Acknowledge presence or absence of significant depression and/or stress, maximize coping skills, provide positive support system. Participant is able to verbalize types and ability to use techniques and skills needed for reducing stress and depression.  Initial Review & Psychosocial Screening:   Quality of Life Scores: Quality of Life - 10/12/18 1017      Quality of Life Scores   Health/Function Pre  20.38 %    Socioeconomic Pre  19.71 %    Psych/Spiritual Pre  20.14 %    Family Pre  20.6 %    GLOBAL Pre  20.23 %      Scores of 19 and below usually indicate a poorer quality of life in these areas.  A difference of  2-3 points is a clinically meaningful difference.  A difference of 2-3 points in the total score of the Quality of Life Index has been associated with significant improvement in overall quality of life, self-image, physical symptoms, and general health in studies assessing change in quality of life.   PHQ-9: Recent Review Flowsheet Data    Depression screen [facility] 2/9 10/21/2018 04/08/2018   Decreased Interest 1 2   Down, Depressed, Hopeless 1 1   PHQ - 2 Score 2 3   Altered sleeping 0 0   Tired, decreased energy 1 1   Change in appetite 1 1   Feeling bad or failure about yourself  2 1   Trouble concentrating 1 1   Moving slowly or fidgety/restless 0 0   Suicidal thoughts 0 0   PHQ-9 Score 7 7   Difficult doing work/chores Somewhat difficult Somewhat difficult     Interpretation of Total Score  Total Score Depression Severity:  1-4 = Minimal depression, 5-9 = Mild depression, 10-14 = Moderate depression, 15-19 = Moderately  severe depression, 20-27 =  Severe depression   Psychosocial Evaluation and Intervention: Psychosocial Evaluation - 10/21/18 1542      Discharge Psychosocial Assessment & Intervention   Comments  Patient's exit QOL score remained the same at 18.83 and her PHQ-9 score was also the same. She continues to say she feels down at times and does feel better overall both mentally and physically.        Psychosocial Re-Evaluation: Psychosocial Re-Evaluation    [place] Name 05/13/18 1557 06/03/18 1511 07/01/18 1450 07/28/18 1448 08/13/18 0744     Psychosocial Re-Evaluation   Current issues with  Current Depression  Current Depression  Current Depression  Current Depression  Current Depression   Comments  Patient's initial QOL score was 20.33 and her PHQ-9 score was 7. She says she is depressed but refuses any treatment. She feels she is able to manage it on her own.   Patient's initial QOL score was 20.33 and her PHQ-9 score was 7. She says she feels her depression has improved especially since her appetite is improving.   Patient's initial QOL score was 20.33 and her PHQ-9 score was 7. She says she feels her depression continues to improve especially since her appetite is improving.   Patient's initial QOL score was 20.33 and her PHQ-9 score was 7. She says she feels her depression continues to improve especially since her appetite is improving.   Patient's initial QOL score was 20.33 and her PHQ-9 score was 7. She says she feels her depression continues to improve especially since her appetite is improving.    Expected Outcomes  Patient's QOL and PHQ-9 score will improve and she will verbalize improvement in her depression at discharge.   Patient's QOL and PHQ-9 score will improve and she will verbalize improvement in her depression at discharge.   Patient's QOL and PHQ-9 score will improve and she will verbalize improvement in her depression at discharge.   Patient's QOL and PHQ-9 score will improve and she will verbalize improvement in  her depression at discharge.   Patient's QOL and PHQ-9 score will improve and she will verbalize improvement in her depression at discharge.    Interventions  Stress management education;Encouraged to attend Pulmonary Rehabilitation for the exercise;Relaxation education  Stress management education;Encouraged to attend Pulmonary Rehabilitation for the exercise;Relaxation education  Stress management education;Encouraged to attend Pulmonary Rehabilitation for the exercise;Relaxation education  Stress management education;Encouraged to attend Pulmonary Rehabilitation for the exercise;Relaxation education  Stress management education;Encouraged to attend Pulmonary Rehabilitation for the exercise;Relaxation education   Continue Psychosocial Services   Follow up required by staff  Follow up required by staff  Follow up required by staff  Follow up required by staff  Follow up required by staff   [place] Name 09/02/18 1554 09/30/18 1313           Psychosocial Re-Evaluation   Current issues with  Current Depression  Current Depression      Comments  Patient's initial QOL score was 20.33 and her PHQ-9 score was 7. She says she feels her depression continues to improve especially since her appetite is improving.   Patient continues to say her depression is improving as her physical health and breathing continue to improve. Will continue to monitor for progress.       Expected Outcomes  Patient's QOL and PHQ-9 score will improve and she will verbalize improvement in her depression at discharge.   Patient's QOL and PHQ-9 score will improve and she will verbalize improvement  in her depression at discharge.       Interventions  Stress management education;Encouraged to attend Pulmonary Rehabilitation for the exercise;Relaxation education  Stress management education;Encouraged to attend Pulmonary Rehabilitation for the exercise;Relaxation education      Continue Psychosocial Services   Follow up required by staff   Follow up required by staff         Psychosocial Discharge (Final Psychosocial Re-Evaluation): Psychosocial Re-Evaluation - 09/30/18 1313      Psychosocial Re-Evaluation   Current issues with  Current Depression    Comments  Patient continues to say her depression is improving as her physical health and breathing continue to improve. Will continue to monitor for progress.     Expected Outcomes  Patient's QOL and PHQ-9 score will improve and she will verbalize improvement in her depression at discharge.     Interventions  Stress management education;Encouraged to attend Pulmonary Rehabilitation for the exercise;Relaxation education    Continue Psychosocial Services   Follow up required by staff        Education: Education Goals: Education classes will be provided on a weekly basis, covering required topics. Participant will state understanding/return demonstration of topics presented.  Learning Barriers/Preferences:   Education Topics: How Lungs Work and Diseases: - Discuss the anatomy of the lungs and diseases that can affect the lungs, such as COPD.   PULMONARY REHAB OTHER RESPIRATORY from 10/07/2018 in [place]  Date  07/01/18  Educator  DWynetta Emery  Instruction Review Code  2- Demonstrated Understanding      Exercise: -Discuss the importance of exercise, FITT principles of exercise, normal and abnormal responses to exercise, and how to exercise safely.   Environmental Irritants: -Discuss types of environmental irritants and how to limit exposure to environmental irritants.   PULMONARY REHAB OTHER RESPIRATORY from 10/07/2018 in [place]  Date  07/08/18  Educator  D. Coad  Instruction Review Code  2- Demonstrated Understanding      Meds/Inhalers and oxygen: - Discuss respiratory medications, definition of an inhaler and oxygen, and the proper way to use an inhaler and oxygen.   PULMONARY REHAB OTHER RESPIRATORY from  10/07/2018 in [place]  Date  04/15/18  Educator  D. Wynetta Emery      Energy Saving Techniques: - Discuss methods to conserve energy and decrease shortness of breath when performing activities of daily living.    Bronchial Hygiene / Breathing Techniques: - Discuss breathing mechanics, pursed-lip breathing technique,  proper posture, effective ways to clear airways, and other functional breathing techniques   PULMONARY REHAB OTHER RESPIRATORY from 10/07/2018 in [place]  Date  04/29/18  Educator  Etheleen Mayhew  Instruction Review Code  2- Demonstrated Understanding      Cleaning Equipment: - Provides group verbal and written instruction about the health risks of elevated stress, cause of high stress, and healthy ways to reduce stress.   Nutrition I: Fats: - Discuss the types of cholesterol, what cholesterol does to the body, and how cholesterol levels can be controlled.   Nutrition II: Labels: -Discuss the different components of food labels and how to read food labels.   PULMONARY REHAB OTHER RESPIRATORY from 10/07/2018 in [place]  Date  05/20/18  Educator  Etheleen Mayhew  Instruction Review Code  2- Demonstrated Understanding      Respiratory Infections: - Discuss the signs and symptoms of respiratory infections, ways to prevent respiratory infections, and the importance of seeking medical treatment  when having a respiratory infection.   PULMONARY REHAB OTHER RESPIRATORY from 10/07/2018 in [place]  Date  06/03/18  Educator  Etheleen Mayhew  Instruction Review Code  2- Demonstrated Understanding      Stress I: Signs and Symptoms: - Discuss the causes of stress, how stress may lead to anxiety and depression, and ways to limit stress.   PULMONARY REHAB OTHER RESPIRATORY from 10/07/2018 in [place]  Date  06/10/18  Educator  Etheleen Mayhew  Instruction Review Code  2-  Demonstrated Understanding      Stress II: Relaxation: -Discuss relaxation techniques to limit stress.   PULMONARY REHAB OTHER RESPIRATORY from 10/07/2018 in [place]  Date  06/17/18  Educator  Etheleen Mayhew  Instruction Review Code  2- Demonstrated Understanding      Oxygen for Home/Travel: - Discuss how to prepare for travel when on oxygen and proper ways to transport and store oxygen to ensure safety.   PULMONARY REHAB OTHER RESPIRATORY from 10/07/2018 in [place]  Date  09/23/18  Educator  MB  Instruction Review Code  2- Demonstrated Understanding      Knowledge Questionnaire Score: Knowledge Questionnaire Score - 10/21/18 1535      Knowledge Questionnaire Score   Pre Score  12/18    Post Score  13/18       Core Components/Risk Factors/Patient Goals at Admission:   Core Components/Risk Factors/Patient Goals Review:  Goals and Risk Factor Review    Row Name 05/13/18 1553 06/03/18 1506 07/01/18 1449 07/28/18 1445 08/13/18 0741     Core Components/Risk Factors/Patient Goals Review   Personal Goals Review  Improve shortness of breath with ADL's;Other Get energy back; increase appetite.   Improve shortness of breath with ADL's;Other Get energy back; increase appetite.   Improve shortness of breath with ADL's;Other Get energy back; increase appetitie.   Improve shortness of breath with ADL's;Other Get energy back; increase appetite.   Improve shortness of breath with ADL's;Other Get energy back; increase appetite.    Review  Patient has completed 6 sessions losing 4 lbs since her initial visit. Her attendance has been inconsistent due to illness, MD appointments and procedures. She recently has a UTI and was prescribed oxygen therapy. Her progress in the program has been impeded by her inconsistent attendance. Will continue to monitor for progress.   Patient has completed 11 sessions maintaining her weight since last 30 day review;  however, she says she has gained 4 lbs on her scales in recent weeks. Her appetite has improved and she is cooking more now. Her gastroenterologist adjusted her medications treating her nausea. She does feel better. She says she does feel stronger and is breathing better since they put her on O2. Will continue to monitor for progress.   Patient has completed 17 sessions maintaining her weight since last 30 day review. She says her appetite continues to improve. She is doing well in the program with progression. She says she feels stronger and has more stamina. She is pleased with her progress in the program. Will continue to monitor for progress.   Patient has completed 21 sessions maintaining her weight since last 30 day review. Her appetite continues to improve. She continues to do well in the program with progression. She continues to say she feels stronger and has more energy. She says she feels less SOB and attributes this to the program and being on oxygen. Will continue to monitor for progress.  Patient has completed 23 sessions losing 2 lbs since last 30 day review. Her appetite continues to improve. She is doing well in the program with progression. She has been out for the past few sessions due to having a stomach virus. She says she is feeling like she is getting her energy back and does feel stronger. Will continue to monitor for progress.    Expected Outcomes  Patient will continue to attend sessions and complete the program and meet her personal goals.   Patient will continue to attend sessions and complete the program and meet her personal goals.   Patient will continue to attend sessions and complete the program and meet her personal goals.   Patient will continue to attend sessions and complete the program and meet her personal goals.   Patient will continue to attend sessions and complete the program and meet her personal goals.    [place] Name 09/02/18 1551 09/30/18 1311 10/21/18 1536          Core Components/Risk Factors/Patient Goals Review   Personal Goals Review  Improve shortness of breath with ADL's;Other Get energy back; increase appetite.   Improve shortness of breath with ADL's;Other Get energy back; increase appetite.   Improve shortness of breath with ADL's;Other Get energy back; increase appetite.      Review  Patient has completed 27 sessions losing 2 lbs since last 30 day review. She says she has more energy and feels like doing more around the house. She has missed some sessions recently due to a GI virus. She says her weakness has subsided from this and she feels more like herself again. She is pleased with her progress in the program. Will continue to monitor.   Patient has completed 32 sessions losint 4 lbs since last 30 day review. She continues to do well in the program with progressionl. She states her appetite is much better and her breathing has improved 100%. She also says she has more energy. She is very pleased with her progress in the program. Will continue to monitor for progress.   Patient completed the program with 36 sessions losing 11.7 lbs overall and decreasing in inches in hip and waist measurements. She did well in the program stating she absolutely feels better and the program gave her confidence to suceed. Her exit measurements improved in balance and her exit walk test improved by 25%. Her pulmonary assessment scores all improved and she said she was less SOB and breathing better overall. Her medficts score improved by 11% and her appetite improved during the program which pleased the patient. She has joined [employer] and plans to continue exercising there at least 2 days/week. PR will f/u for one year.      Expected Outcomes  Patient will continue to attend sessions and complete the program and meet her personal goals.   Patient will continue to attend sessions and complete the program and meet her personal goals.   Patient will continue exercising at [facility] and continue to meet her personal goals.         Core Components/Risk Factors/Patient Goals at Discharge (Final Review):  Goals and Risk Factor Review - 10/21/18 1536      Core Components/Risk Factors/Patient Goals Review   Personal Goals Review  Improve shortness of breath with ADL's;Other   Get energy back; increase appetite.    Review  Patient completed the program with 36 sessions losing 11.7 lbs overall and decreasing in inches in hip and waist  measurements. She did well in the program stating she absolutely feels better and the program gave her confidence to suceed. Her exit measurements improved in balance and her exit walk test improved by 25%. Her pulmonary assessment scores all improved and she said she was less SOB and breathing better overall. Her medficts score improved by 11% and her appetite improved during the program which pleased the patient. She has joined [employer] and plans to continue exercising there at least 2 days/week. PR will f/u for one year.     Expected Outcomes  Patient will continue exercising at [facility] [employer] and continue to meet her personal goals.        ITP Comments:   Comments: Patient graduated from Pulmonary Rehabilitation today on 10/19/2018 after completing 36 sessions. She achieved LTG of 30 minutes of aerobic exercise at Max Met level of 2.37. All patients vitals are WNL. Patient has met with dietician. Discharge instruction has been reviewed in detail and patient stated an understanding of material given. Patient plans to continue exercising at [employer]. Pulmonary Rehab staff will make f/u calls at 1 month, 6 months, and 1 year. Patient had no complaints of any abnormal S/S or pain on their exit visit.

## 2018-10-21 NOTE — Progress Notes (Signed)
Discharge Progress Report  Patient Details  Name: Ashlee Martin MRN: [id number] Date of Birth: 03/06/1931 Referring Provider:     PULMONARY REHAB OTHER RESP ORIENTATION from 04/08/2018 in [place]  Referring Provider  [place]       Number of Visits: 68  Reason for Discharge:  Patient reached a stable level of exercise. Patient independent in their exercise. Patient has met program and personal goals.  Smoking History:  Social History   Tobacco Use  Smoking Status Former Smoker  . Packs/day: 3.00  . Years: 20.00  . Pack years: 60.00  . Types: Cigarettes  . Last attempt to quit: 11/03/1972  . Years since quitting: 45.9  Smokeless Tobacco Never Used    Diagnosis:  ILD (interstitial lung disease) ([place])  ADL UCSD: Pulmonary Assessment Scores    Row Name 10/21/18 1533         ADL UCSD   ADL Phase  Exit     SOB Score total  9     Rest  0     Walk  1     Stairs  1     Bath  0     Dress  0     Shop  0       CAT Score   CAT Score  14       mMRC Score   mMRC Score  2        Initial Exercise Prescription:   Discharge Exercise Prescription (Final Exercise Prescription Changes): Exercise Prescription Changes - 10/05/18 0800      Response to Exercise   Blood Pressure (Admit)  118/62    Blood Pressure (Exercise)  144/60    Blood Pressure (Exit)  116/60    Heart Rate (Admit)  68 bpm    Heart Rate (Exercise)  75 bpm    Heart Rate (Exit)  71 bpm    Oxygen Saturation (Admit)  92 %    Oxygen Saturation (Exercise)  97 %    Oxygen Saturation (Exit)  95 %    Rating of Perceived Exertion (Exercise)  13    Perceived Dyspnea (Exercise)  13    Duration  Continue with 30 min of aerobic exercise without signs/symptoms of physical distress.    Intensity  THRR unchanged      Progression   Progression  Continue to progress workloads to maintain intensity without signs/symptoms of physical distress.    Average METs  1.9      Resistance  Training   Training Prescription  Yes    Weight  4    Reps  10-15    Time  5 Minutes      Oxygen   Oxygen  Continuous    Liters  3      Treadmill   MPH  1.7   was not feeling well due to some medication    Grade  0    Minutes  17    METs  2.3      NuStep   Level  2    SPM  70    Minutes  22    METs  1.7      Home Exercise Plan   Plans to continue exercise at  Home (comment)    Frequency  Add 3 additional days to program exercise sessions.    Initial Home Exercises Provided  04/29/18       Functional Capacity: 6 Minute Walk    Row Name 10/12/18 1504  6 Minute Walk   Phase  Discharge     Distance  1000 feet     Distance % Change  25 %     Distance Feet Change  200 ft     Walk Time  6 minutes     # of Rest Breaks  0     MPH  1.89     METS  2.45     RPE  11     Perceived Dyspnea   6     VO2 Peak  5.87     Symptoms  Yes (comment)     Comments  4/10 back pain which pt thinks is a side effect of arthritis, subsided with rest.      Resting HR  93 bpm     Resting BP  122/64     Resting Oxygen Saturation   98 %     Exercise Oxygen Saturation  during 6 min walk  88 %     Max Ex. HR  86 bpm     Max Ex. BP  140/60     2 Minute Post BP  118/62        Psychological, QOL, Others - Outcomes: PHQ 2/9: Depression screen [facility] 2/9 10/21/2018 04/08/2018  Decreased Interest 1 2  Down, Depressed, Hopeless 1 1  PHQ - 2 Score 2 3  Altered sleeping 0 0  Tired, decreased energy 1 1  Change in appetite 1 1  Feeling bad or failure about yourself  2 1  Trouble concentrating 1 1  Moving slowly or fidgety/restless 0 0  Suicidal thoughts 0 0  PHQ-9 Score 7 7  Difficult doing work/chores Somewhat difficult Somewhat difficult    Quality of Life: Quality of Life - 10/12/18 1017      Quality of Life Scores   Health/Function Pre  20.38 %    Socioeconomic Pre  19.71 %    Psych/Spiritual Pre  20.14 %    Family Pre  20.6 %    GLOBAL Pre  20.23 %       Personal  Goals: Goals established at orientation with interventions provided to work toward goal.    Personal Goals Discharge: Goals and Risk Factor Review    Row Name 05/13/18 1553 06/03/18 1506 07/01/18 1449 07/28/18 1445 08/13/18 0741     Core Components/Risk Factors/Patient Goals Review   Personal Goals Review  Improve shortness of breath with ADL's;Other Get energy back; increase appetite.   Improve shortness of breath with ADL's;Other Get energy back; increase appetite.   Improve shortness of breath with ADL's;Other Get energy back; increase appetitie.   Improve shortness of breath with ADL's;Other Get energy back; increase appetite.   Improve shortness of breath with ADL's;Other Get energy back; increase appetite.    Review  Patient has completed 6 sessions losing 4 lbs since her initial visit. Her attendance has been inconsistent due to illness, MD appointments and procedures. She recently has a UTI and was prescribed oxygen therapy. Her progress in the program has been impeded by her inconsistent attendance. Will continue to monitor for progress.   Patient has completed 11 sessions maintaining her weight since last 30 day review; however, she says she has gained 4 lbs on her scales in recent weeks. Her appetite has improved and she is cooking more now. Her gastroenterologist adjusted her medications treating her nausea. She does feel better. She says she does feel stronger and is breathing better since they put her on O2. Will continue  to monitor for progress.   Patient has completed 17 sessions maintaining her weight since last 30 day review. She says her appetite continues to improve. She is doing well in the program with progression. She says she feels stronger and has more stamina. She is pleased with her progress in the program. Will continue to monitor for progress.   Patient has completed 21 sessions maintaining her weight since last 30 day review. Her appetite continues to improve. She continues to  do well in the program with progression. She continues to say she feels stronger and has more energy. She says she feels less SOB and attributes this to the program and being on oxygen. Will continue to monitor for progress.   Patient has completed 23 sessions losing 2 lbs since last 30 day review. Her appetite continues to improve. She is doing well in the program with progression. She has been out for the past few sessions due to having a stomach virus. She says she is feeling like she is getting her energy back and does feel stronger. Will continue to monitor for progress.    Expected Outcomes  Patient will continue to attend sessions and complete the program and meet her personal goals.   Patient will continue to attend sessions and complete the program and meet her personal goals.   Patient will continue to attend sessions and complete the program and meet her personal goals.   Patient will continue to attend sessions and complete the program and meet her personal goals.   Patient will continue to attend sessions and complete the program and meet her personal goals.    [place] Name 09/02/18 1551 09/30/18 1311 10/21/18 1536         Core Components/Risk Factors/Patient Goals Review   Personal Goals Review  Improve shortness of breath with ADL's;Other Get energy back; increase appetite.   Improve shortness of breath with ADL's;Other Get energy back; increase appetite.   Improve shortness of breath with ADL's;Other Get energy back; increase appetite.      Review  Patient has completed 27 sessions losing 2 lbs since last 30 day review. She says she has more energy and feels like doing more around the house. She has missed some sessions recently due to a GI virus. She says her weakness has subsided from this and she feels more like herself again. She is pleased with her progress in the program. Will continue to monitor.   Patient has completed 32 sessions losint 4 lbs since last 30 day review. She continues to do  well in the program with progressionl. She states her appetite is much better and her breathing has improved 100%. She also says she has more energy. She is very pleased with her progress in the program. Will continue to monitor for progress.   Patient completed the program with 36 sessions losing 11.7 lbs overall and decreasing in inches in hip and waist measurements. She did well in the program stating she absolutely feels better and the program gave her confidence to suceed. Her exit measurements improved in balance and her exit walk test improved by 25%. Her pulmonary assessment scores all improved and she said she was less SOB and breathing better overall. Her medficts score improved by 11% and her appetite improved during the program which pleased the patient. She has joined [employer] and plans to continue exercising there at least 2 days/week. PR will f/u for one year.      Expected Outcomes  Patient will  continue to attend sessions and complete the program and meet her personal goals.   Patient will continue to attend sessions and complete the program and meet her personal goals.   Patient will continue exercising at [employer] and continue to meet her personal goals.         Exercise Goals and Review:   Exercise Goals Re-Evaluation: Exercise Goals Re-Evaluation    Row Name 04/30/18 1452 05/14/18 0745 06/01/18 1320 06/30/18 1513 07/20/18 1453     Exercise Goal Re-Evaluation   Exercise Goals Review  Increase Physical Activity;Increase Strength and Stamina;Able to understand and use Dyspnea scale;Understanding of Exercise Prescription;Knowledge and understanding of Target Heart Rate Range (THRR)  Increase Physical Activity;Increase Strength and Stamina;Able to understand and use Dyspnea scale;Understanding of Exercise Prescription;Knowledge and understanding of Target Heart Rate Range (THRR)  Increase Physical Activity;Increase Strength and Stamina;Able to understand and use Dyspnea  scale;Understanding of Exercise Prescription;Knowledge and understanding of Target Heart Rate Range (THRR)  Increase Physical Activity;Increase Strength and Stamina;Able to understand and use Dyspnea scale;Understanding of Exercise Prescription;Knowledge and understanding of Target Heart Rate Range (THRR)  Increase Physical Activity;Increase Strength and Stamina;Able to understand and use Dyspnea scale;Understanding of Exercise Prescription;Knowledge and understanding of Target Heart Rate Range (THRR)   Comments  Patient has had 4 visits so far. She is still weak due to her stomach pains but will hopefully have those issues resolved soon.   Patient has had 6 visits so far. She is still weak due to her last episode of having urinary track infection. She is on an antibiotics and they have added supplemental  oxygen. Will clarify the liter flow on her next visit.   Patient has been doing well in the program since being place on oxygen. She is working out on [employer]. She seems to be getting around better with it although she doesn't want it. Will continue to monitor and progress.   Patient continues to do well in the program. She has adjusted to having her oxygen full time and now sees the benefit. She is handling increases on boththe treadmill and nustep well.   Patient is doing well in the program. She is now walking 1.50mh on the treadmill with ease. She has regained her appetite and has been able to put on a few pounds. We will continue to progress her as we see fit.    Expected Outcomes  Get energy back, iIncrease appetite and continue to get stronger.   Get energy back, iIncrease appetite and continue to get stronger.   Increase energy levels, appetite and get stronger.   Increase energy levels, appetite and get stronger.   Increase energy levels and stamina.    RG[place]coeName 08/12/18 1456 09/02/18 0745 09/28/18 1029         Exercise Goal Re-Evaluation   Exercise Goals Review  Increase Physical Activity;Increase  Strength and Stamina;Able to understand and use Dyspnea scale;Understanding of Exercise Prescription;Knowledge and understanding of Target Heart Rate Range (THRR)  Increase Physical Activity;Increase Strength and Stamina;Able to understand and use Dyspnea scale;Understanding of Exercise Prescription;Knowledge and understanding of Target Heart Rate Range (THRR)  Increase Physical Activity;Increase Strength and Stamina;Able to understand and use Dyspnea scale;Understanding of Exercise Prescription;Knowledge and understanding of Target Heart Rate Range (THRR)     Comments  Pt. continues to due well in the program. She has been out due to sickness. Upon return we will continue to monitor and progress as tolerated.   Pt. has been able to return to the  program. She is back to working hard and tolerating the exercise well. We have readjusted her speed on the TM to get her back to where she was before getting sick. We will continue to progress her as needed, when she's ready.   Pt. is doing great in the program. She has attended 31 sessions so just 5 more until graduation. She has gotten so much stronger and is visibly able to do more. She is excited to graduate and get in to her own exercise routine.      Expected Outcomes  Increase energy levels and stamina.   Increase energy levels and stamina.   Increase energy levels and stamina.         Nutrition & Weight - Outcomes:  Post Biometrics - 10/12/18 1509       Post  Biometrics   Height  _0  (1.651 m)    Weight  63.6 kg    Waist Circumference  37.5 inches    Hip Circumference  38 inches    Waist to Hip Ratio  0.99 %    BMI (Calculated)  23.33    Triceps Skinfold  13 mm    % Body Fat  34.8 %    Grip Strength  32 kg    Flexibility  0 in    Single Leg Stand  10 seconds       Nutrition: Nutrition Therapy & Goals - 09/30/18 1308      Nutrition Therapy   RD appointment deferred  Yes      Personal Nutrition Goals   Comments  Patient did not attend  the February RD appointment. Patient has lost 4 lbs since last 30 day review. She is trying to gain weight and continues to say her appetite is improving. She is eating more since the program started. Will continue to monitor for progress.       Intervention Plan   Intervention  Nutrition handout(s) given to patient.       Nutrition Discharge: Nutrition Assessments - 10/21/18 1535      MEDFICTS Scores   Pre Score  27    Post Score  24    Score Difference  -3       Education Questionnaire Score: Knowledge Questionnaire Score - 10/21/18 1535      Knowledge Questionnaire Score   Pre Score  12/18    Post Score  13/18       Goals reviewed with patient; copy given to patient.

## 2018-10-22 DIAGNOSIS — I10 Essential (primary) hypertension: Secondary | ICD-10-CM | POA: Diagnosis not present

## 2018-10-22 DIAGNOSIS — Z299 Encounter for prophylactic measures, unspecified: Secondary | ICD-10-CM | POA: Diagnosis not present

## 2018-10-22 DIAGNOSIS — Z6823 Body mass index (BMI) 23.0-23.9, adult: Secondary | ICD-10-CM | POA: Diagnosis not present

## 2018-10-22 DIAGNOSIS — J8410 Pulmonary fibrosis, unspecified: Secondary | ICD-10-CM | POA: Diagnosis not present

## 2018-11-05 DIAGNOSIS — R300 Dysuria: Secondary | ICD-10-CM | POA: Diagnosis not present

## 2018-11-05 DIAGNOSIS — Z299 Encounter for prophylactic measures, unspecified: Secondary | ICD-10-CM | POA: Diagnosis not present

## 2018-11-05 DIAGNOSIS — R092 Respiratory arrest: Secondary | ICD-10-CM | POA: Diagnosis not present

## 2018-11-05 DIAGNOSIS — Z96649 Presence of unspecified artificial hip joint: Secondary | ICD-10-CM | POA: Diagnosis not present

## 2018-11-05 DIAGNOSIS — M1611 Unilateral primary osteoarthritis, right hip: Secondary | ICD-10-CM | POA: Diagnosis not present

## 2018-11-05 DIAGNOSIS — N390 Urinary tract infection, site not specified: Secondary | ICD-10-CM | POA: Diagnosis not present

## 2018-11-05 DIAGNOSIS — R609 Edema, unspecified: Secondary | ICD-10-CM | POA: Diagnosis not present

## 2018-11-05 DIAGNOSIS — W57XXXA Bitten or stung by nonvenomous insect and other nonvenomous arthropods, initial encounter: Secondary | ICD-10-CM | POA: Diagnosis not present

## 2018-11-05 DIAGNOSIS — J849 Interstitial pulmonary disease, unspecified: Secondary | ICD-10-CM | POA: Diagnosis not present

## 2018-11-05 DIAGNOSIS — R0902 Hypoxemia: Secondary | ICD-10-CM | POA: Diagnosis not present

## 2018-11-05 DIAGNOSIS — Z6823 Body mass index (BMI) 23.0-23.9, adult: Secondary | ICD-10-CM | POA: Diagnosis not present

## 2018-11-05 DIAGNOSIS — I10 Essential (primary) hypertension: Secondary | ICD-10-CM | POA: Diagnosis not present

## 2018-11-15 ENCOUNTER — Telehealth: Payer: Self-pay | Admitting: Internal Medicine

## 2018-11-15 NOTE — Telephone Encounter (Signed)
Call made to patient, requesting Tele Visit. Appt made. Nothing further is needed at this time.

## 2018-11-17 ENCOUNTER — Encounter: Payer: Self-pay | Admitting: Gastroenterology

## 2018-11-18 ENCOUNTER — Ambulatory Visit (INDEPENDENT_AMBULATORY_CARE_PROVIDER_SITE_OTHER): Payer: Medicare HMO | Admitting: Pulmonary Disease

## 2018-11-18 ENCOUNTER — Encounter: Payer: Self-pay | Admitting: Pulmonary Disease

## 2018-11-18 ENCOUNTER — Ambulatory Visit: Payer: Medicare HMO | Admitting: Internal Medicine

## 2018-11-18 ENCOUNTER — Other Ambulatory Visit: Payer: Self-pay

## 2018-11-18 DIAGNOSIS — R112 Nausea with vomiting, unspecified: Secondary | ICD-10-CM | POA: Diagnosis not present

## 2018-11-18 DIAGNOSIS — J9601 Acute respiratory failure with hypoxia: Secondary | ICD-10-CM

## 2018-11-18 DIAGNOSIS — Z5181 Encounter for therapeutic drug level monitoring: Secondary | ICD-10-CM | POA: Diagnosis not present

## 2018-11-18 DIAGNOSIS — J301 Allergic rhinitis due to pollen: Secondary | ICD-10-CM | POA: Diagnosis not present

## 2018-11-18 DIAGNOSIS — J849 Interstitial pulmonary disease, unspecified: Secondary | ICD-10-CM | POA: Diagnosis not present

## 2018-11-18 NOTE — Assessment & Plan Note (Signed)
Assessment: Known IPF Currently managed on OFEV  Plan: Continue OFEV Continue Imodium Monday Wednesday Friday for management of diarrhea 4-week telephonic outreach with Wyn Quaker, FNP

## 2018-11-18 NOTE — Assessment & Plan Note (Signed)
Plan: Can continue to use Zofran as needed as needed for management of nausea

## 2018-11-18 NOTE — Patient Instructions (Signed)
Continue OFEV   Can continue Imodium Monday Wednesday Friday for management of diarrhea  Can continue to use Zofran as needed for nausea  Can start Flonase 1 spray each nostril as needed for nasal congestion and allergies  Continue oxygen therapy as prescribed  >>>maintain oxygen saturations greater than 88 percent  >>>if unable to maintain oxygen saturations please contact the office  >>>do not smoke with oxygen  >>>can use nasal saline gel or nasal saline rinses to moisturize nose if oxygen causes dryness   4-week telephonic outreach with Wyn Quaker, FNP Contact our office sooner if symptoms worsen or you have concerns regarding your breathing   Coronavirus (COVID-19) Are you at risk?  Are you at risk for the Coronavirus (COVID-19)?  To be considered HIGH RISK for Coronavirus (COVID-19), you have to meet the following criteria:  . Traveled to [place], [place], [place], [place] or [place]; or in the [place] to [place], [place], [place], or [place]; and have fever, cough, and shortness of breath within the last 2 weeks of travel OR . Been in close contact with a person diagnosed with COVID-19 within the last 2 weeks and have fever, cough, and shortness of breath . IF YOU DO NOT MEET THESE CRITERIA, YOU ARE CONSIDERED LOW RISK FOR COVID-19.  What to do if you are HIGH RISK for COVID-19?  Marland Kitchen If you are having a medical emergency, call 911. . Seek medical care right away. Before you go to a doctor's office, urgent care or emergency department, call ahead and tell them about your recent travel, contact with someone diagnosed with COVID-19, and your symptoms. You should receive instructions from your physician's office regarding next steps of care.  . When you arrive at healthcare provider, tell the healthcare staff immediately you have returned from visiting [place], [place], [place], [place] or [place]; or traveled in the [place] to [place], [place], [place], or  [place]; in the last two weeks or you have been in close contact with a person diagnosed with COVID-19 in the last 2 weeks.   . Tell the health care staff about your symptoms: fever, cough and shortness of breath. . After you have been seen by a medical provider, you will be either: o Tested for (COVID-19) and discharged home on quarantine except to seek medical care if symptoms worsen, and asked to  - Stay home and avoid contact with others until you get your results (4-5 days)  - Avoid travel on public transportation if possible (such as bus, train, or airplane) or o Sent to the Emergency Department by EMS for evaluation, COVID-19 testing, and possible admission depending on your condition and test results.  What to do if you are LOW RISK for COVID-19?  Reduce your risk of any infection by using the same precautions used for avoiding the common cold or flu:  Marland Kitchen Wash your hands often with soap and warm water for at least 20 seconds.  If soap and water are not readily available, use an alcohol-based hand sanitizer with at least 60% alcohol.  . If coughing or sneezing, cover your mouth and nose by coughing or sneezing into the elbow areas of your shirt or coat, into a tissue or into your sleeve (not your hands). . Avoid shaking hands with others and consider head nods or verbal greetings only. . Avoid touching your eyes, nose, or mouth with unwashed hands.  . Avoid close contact with people who are sick. . Avoid  places or events with large numbers of people in one location, like concerts or sporting events. . Carefully consider travel plans you have or are making. . If you are planning any travel outside or inside the [place], visit the CDC's Travelers' Health webpage for the latest health notices. . If you have some symptoms but not all symptoms, continue to monitor at home and seek medical attention if your symptoms worsen. . If you are having a medical emergency, call 911.   [place] / e-Visit: [email or web address]         [facility]: [facility]: [phone]                   [facility]: [phone]           It is flu season:   >>> Best ways to protect herself from the flu: Receive the yearly flu vaccine, practice good hand hygiene washing with soap and also using hand sanitizer when available, eat a nutritious meals, get adequate rest, hydrate appropriately   Please contact the office if your symptoms worsen or you have concerns that you are not improving.   Thank you for choosing  [facility] for your healthcare, and for allowing [place] to partner with you on your healthcare journey. I am thankful to be able to provide care to you today.   Wyn Quaker FNP-C

## 2018-11-18 NOTE — Progress Notes (Signed)
Virtual Visit via Telephone Note  I connected with Ashlee Martin on 11/18/18 at 11:00 AM EDT by telephone and verified that I am speaking with the correct person using two identifiers.   I discussed the limitations, risks, security and privacy concerns of performing an evaluation and management service by telephone and the availability of in person appointments. I also discussed with the patient that there may be a patient responsible charge related to this service. The patient expressed understanding and agreed to proceed.   History of Present Illness:  85 year old female former smoker followed in our office for idiopathic pulmonary fibrosis (failed [place], currently on 0FEV)  Smoker/ Smoking History: Former smoker.  60-pack-year smoking history Maintenance: OFEV Pt of: Dr. Chase Caller   Time when patient was reached: 1056 Time when visit ended: 1116 Patient consented to consult via telephone: yes People present and their role in pt care: Pt   Chief complaint: IPF follow up, ear tenderness  85 year old female followed in our office for IPF.  Patient is currently managed on 05.  Patient reports that her breathing has been stable since last office visit.  She was just treated with ciprofloxacin for 7 days due to recent urinary tract infection.  This was diagnosed via UA/culture per patient.  Patient reports that breathing has been stable.  Diarrhea has also been well controlled on Monday Wednesday Friday Imodium.  Patient has been using Zofran but only half a tablet to occasionally address nausea as she reports when she takes a full tablet it upsets her stomach.  Patient is practicing home isolation at this time.  She has enough food at this time.  She has no questions regarding her medications.  She does have known allergies and allergic rhinitis.  She reports that occasionally over the past couple weeks she is had increased head congestion her ears feel full.  She is also had a runny nose  occasionally at this time.  She is not using her Flonase that she has at home.  Observations/Objective:  06/15/18 -CT chest high-res- previously noted left Navarette lobe nodules completely resolved, chronic changes of interstitial lung disease again considered probable UIP  Assessment and Plan:  Allergic rhinitis Assessment: Patient reports occasionally clear runny nose Patient reports ear fullness Patient reports she has known allergies and typically she has springtime flares  Plan: Start Flonase 1 spray each nostril as needed for nasal congestion daily Avoid known triggers  ILD (interstitial lung disease) ([place]) Assessment: Known IPF Currently managed on OFEV  Plan: Continue OFEV Continue Imodium Monday Wednesday Friday for management of diarrhea 4-week telephonic outreach with Wyn Quaker, FNP  Acute respiratory failure with hypoxia ([place]) Plan: Continue oxygen therapy as prescribed  Nausea and vomiting Plan: Can continue to use Zofran as needed as needed for management of nausea  Therapeutic drug monitoring Plan: We will need LFTs at next in person office visit    Follow Up Instructions:  4-week telephonic outreach with Wyn Quaker, FNP   I discussed the assessment and treatment plan with the patient. The patient was provided an opportunity to ask questions and all were answered. The patient agreed with the plan and demonstrated an understanding of the instructions.   The patient was advised to call back or seek an in-person evaluation if the symptoms worsen or if the condition fails to improve as anticipated.  I provided 20 minutes of non-face-to-face time during this encounter.   Lauraine Rinne, NP

## 2018-11-18 NOTE — Assessment & Plan Note (Signed)
Plan: We will need LFTs at next in person office visit

## 2018-11-18 NOTE — Assessment & Plan Note (Signed)
Plan: Continue oxygen therapy as prescribed

## 2018-11-18 NOTE — Assessment & Plan Note (Signed)
Assessment: Patient reports occasionally clear runny nose Patient reports ear fullness Patient reports she has known allergies and typically she has springtime flares  Plan: Start Flonase 1 spray each nostril as needed for nasal congestion daily Avoid known triggers

## 2018-12-06 DIAGNOSIS — J849 Interstitial pulmonary disease, unspecified: Secondary | ICD-10-CM | POA: Diagnosis not present

## 2018-12-06 DIAGNOSIS — R0902 Hypoxemia: Secondary | ICD-10-CM | POA: Diagnosis not present

## 2018-12-06 DIAGNOSIS — Z96649 Presence of unspecified artificial hip joint: Secondary | ICD-10-CM | POA: Diagnosis not present

## 2018-12-06 DIAGNOSIS — M1611 Unilateral primary osteoarthritis, right hip: Secondary | ICD-10-CM | POA: Diagnosis not present

## 2018-12-10 ENCOUNTER — Telehealth: Payer: Self-pay | Admitting: Internal Medicine

## 2018-12-10 NOTE — Telephone Encounter (Signed)
LMTCB

## 2018-12-13 NOTE — Telephone Encounter (Signed)
LMTCB x2 for pt 

## 2018-12-14 NOTE — Telephone Encounter (Signed)
LMTCB x3 for pt. We have attempted to contact pt several times with no success or call back from pt. Per triage protocol, message will be closed.   

## 2018-12-15 NOTE — Progress Notes (Signed)
Virtual Visit via Telephone Note  I connected with Ashlee Martin on 12/16/18 at  9:30 AM EDT by telephone and verified that I am speaking with the correct person using two identifiers.   I discussed the limitations, risks, security and privacy concerns of performing an evaluation and management service by telephone and the availability of in person appointments. I also discussed with the patient that there may be a patient responsible charge related to this service. The patient expressed understanding and agreed to proceed.   History of Present Illness: 85 year old female former smoker followed in our office for idiopathic pulmonary fibrosis (failed Esbriet, currently on 0FEV) Smoker/ Smoking History: Former smoker. Quit 1974. 60-pack-year smoking history Maintenance: OFEV Pt of: Dr. Chase Caller   Patient consented to consult via telephone: Yes People present and their role in pt care: Pt   Chief complaint: ILD - IPF   85 year old female former smoker followed in our office for IPF.  Patient has previously failed Esbriet and is currently managed on 0FEV.  Patient reported 4 weeks ago that she was stable with no issues or side effects from her 0FEV.  Patient reporting now that is no longer the case.  She reports she is had increased diarrhea for the last 2 weeks.  She reports her nausea has been stable.  The diarrhea has been in spite of patient taking her Imodium Monday Wednesday Friday.  Patient continues to be adherent to her 0FEV.  Patient reporting that weight is down slightly from our February/2020 recorded weight.  Patient reporting weights of around 232 pounds 2 days ago.  Patient reporting that her vital signs this morning are stable.  Patient does admit that she has had small to medium amounts of blood occasionally in her stool over this course of her diarrhea.  She is also had worsened stomach cramps during this time.  Patient denies heart racing, chest pain, increased shortness of breath,  fatigue, dizziness, lightheadedness.  Observations/Objective:  12/14/2018 - weight - 132lbs  12/16/2018 - HR - 67 12/16/2018 - SP02 - 97  06/15/18 -CT chest high-res- previously noted left [place] lobe nodules completely resolved, chronic changes of interstitial lung disease again considered probable UIP  No results found for: NITRICOXIDE  Assessment and Plan:  ILD (interstitial lung disease) ([place]) Assessment: Known IPF Previously failed Esbriet Currently managed on 0FEV 2 weeks of increased diarrhea despite Imodium Monday Wednesday Friday  Plan: 0FEV drug holiday till 12/27/2018 Continue Imodium Monday Wednesday Friday for management of diarrhea, if diarrhea stops then can stop Imodium while off of 0FEV Will need to taper Ofev dose back up if patient is able to tolerate 2-week telephonic outreach with Wyn Quaker, FNP  Allergic rhinitis Assessment: Patient reporting that allergies are better managed with as needed use of Flonase  Plan: Continue as needed use of Flonase  Nausea and vomiting Assessment: Stable at this time  Plan: Zofran as needed  Drug-induced diarrhea Assessment: Likely drug-induced diarrhea from [facility] Patient is already been taking Imodium Monday Wednesday Friday 2 weeks of diarrhea  Plan: Drug holiday from [facility] Follow-up with Wyn Quaker in 2 weeks telephonically  Therapeutic drug monitoring Plan: At next in office visit patient will need blood work for therapeutic drug monitoring    Follow Up Instructions:  Return in about 2 weeks (around 12/30/2018), or if symptoms worsen or fail to improve, for Follow up with Wyn Quaker FNP-C.    I discussed the assessment and treatment plan with the patient. The patient was provided  an opportunity to ask questions and all were answered. The patient agreed with the plan and demonstrated an understanding of the instructions.   The patient was advised to call back or seek an in-person evaluation if the symptoms  worsen or if the condition fails to improve as anticipated.  I provided 22 minutes of non-face-to-face time during this encounter.   Lauraine Rinne, NP

## 2018-12-16 ENCOUNTER — Encounter: Payer: Self-pay | Admitting: Pulmonary Disease

## 2018-12-16 ENCOUNTER — Ambulatory Visit (INDEPENDENT_AMBULATORY_CARE_PROVIDER_SITE_OTHER): Payer: Medicare HMO | Admitting: Pulmonary Disease

## 2018-12-16 ENCOUNTER — Other Ambulatory Visit: Payer: Self-pay

## 2018-12-16 ENCOUNTER — Ambulatory Visit: Payer: Medicare HMO | Admitting: Pulmonary Disease

## 2018-12-16 DIAGNOSIS — Z5181 Encounter for therapeutic drug level monitoring: Secondary | ICD-10-CM | POA: Diagnosis not present

## 2018-12-16 DIAGNOSIS — R112 Nausea with vomiting, unspecified: Secondary | ICD-10-CM | POA: Diagnosis not present

## 2018-12-16 DIAGNOSIS — K521 Toxic gastroenteritis and colitis: Secondary | ICD-10-CM

## 2018-12-16 DIAGNOSIS — J301 Allergic rhinitis due to pollen: Secondary | ICD-10-CM

## 2018-12-16 DIAGNOSIS — J849 Interstitial pulmonary disease, unspecified: Secondary | ICD-10-CM

## 2018-12-16 NOTE — Assessment & Plan Note (Signed)
Plan: At next in office visit patient will need blood work for therapeutic drug monitoring

## 2018-12-16 NOTE — Assessment & Plan Note (Signed)
Assessment: Patient reporting that allergies are better managed with as needed use of Flonase  Plan: Continue as needed use of Flonase

## 2018-12-16 NOTE — Patient Instructions (Addendum)
Stop OFEV till 12/27/2018  Continue imodium Monday / Wednesday / Friday   Elevate legs when able   Wear compression stockings when able  >>>maximum amount of time wearing stockings is 16 hours in a 24 hour day   Continue oxygen therapy as prescribed  >>>maintain oxygen saturations greater than 88 percent  >>>if unable to maintain oxygen saturations please contact the office  >>>do not smoke with oxygen  >>>can use nasal saline gel or nasal saline rinses to moisturize nose if oxygen causes dryness  Continue flonase as needed for allergies    Return in about 2 weeks (around 12/30/2018), or if symptoms worsen or fail to improve, for Follow up with Wyn Quaker FNP-C.    Coronavirus (COVID-19) Are you at risk?  Are you at risk for the Coronavirus (COVID-19)?  To be considered HIGH RISK for Coronavirus (COVID-19), you have to meet the following criteria:  . Traveled to [place], [place], [place], [place] or [place]; or in the [place] to [place], [place], [place], or [place]; and have fever, cough, and shortness of breath within the last 2 weeks of travel OR . Been in close contact with a person diagnosed with COVID-19 within the last 2 weeks and have fever, cough, and shortness of breath . IF YOU DO NOT MEET THESE CRITERIA, YOU ARE CONSIDERED LOW RISK FOR COVID-19.  What to do if you are HIGH RISK for COVID-19?  Marland Kitchen If you are having a medical emergency, call 911. . Seek medical care right away. Before you go to a doctor's office, urgent care or emergency department, call ahead and tell them about your recent travel, contact with someone diagnosed with COVID-19, and your symptoms. You should receive instructions from your physician's office regarding next steps of care.  . When you arrive at healthcare provider, tell the healthcare staff immediately you have returned from visiting [place], [place], [place], [place] or [place]; or traveled in the [place] to [place], [place],  [place], or [place]; in the last two weeks or you have been in close contact with a person diagnosed with COVID-19 in the last 2 weeks.   . Tell the health care staff about your symptoms: fever, cough and shortness of breath. . After you have been seen by a medical provider, you will be either: o Tested for (COVID-19) and discharged home on quarantine except to seek medical care if symptoms worsen, and asked to  - Stay home and avoid contact with others until you get your results (4-5 days)  - Avoid travel on public transportation if possible (such as bus, train, or airplane) or o Sent to the Emergency Department by EMS for evaluation, COVID-19 testing, and possible admission depending on your condition and test results.  What to do if you are LOW RISK for COVID-19?  Reduce your risk of any infection by using the same precautions used for avoiding the common cold or flu:  Marland Kitchen Wash your hands often with soap and warm water for at least 20 seconds.  If soap and water are not readily available, use an alcohol-based hand sanitizer with at least 60% alcohol.  . If coughing or sneezing, cover your mouth and nose by coughing or sneezing into the elbow areas of your shirt or coat, into a tissue or into your sleeve (not your hands). . Avoid shaking hands with others and consider head nods or verbal greetings only. . Avoid touching your eyes, nose, or mouth with unwashed hands.  Marland Kitchen  Avoid close contact with people who are sick. . Avoid places or events with large numbers of people in one location, like concerts or sporting events. . Carefully consider travel plans you have or are making. . If you are planning any travel outside or inside the [place], visit the CDC's Travelers' Health webpage for the latest health notices. . If you have some symptoms but not all symptoms, continue to monitor at home and seek medical attention if your symptoms worsen. . If you are having a medical emergency, call  911.   [place] / e-Visit: [email or web address]         [facility]: [facility]: [phone]                   [facility]: [phone]           It is flu season:   >>> Best ways to protect herself from the flu: Receive the yearly flu vaccine, practice good hand hygiene washing with soap and also using hand sanitizer when available, eat a nutritious meals, get adequate rest, hydrate appropriately   Please contact the office if your symptoms worsen or you have concerns that you are not improving.   Thank you for choosing [facility] for your healthcare, and for allowing [place] to partner with you on your healthcare journey. I am thankful to be able to provide care to you today.   Wyn Quaker FNP-C

## 2018-12-16 NOTE — Assessment & Plan Note (Signed)
Assessment: Likely drug-induced diarrhea from 0FEV Patient is already been taking Imodium Monday Wednesday Friday 2 weeks of diarrhea  Plan: Drug holiday from [place] with Wyn Quaker in 2 weeks telephonically

## 2018-12-16 NOTE — Assessment & Plan Note (Signed)
Assessment: Stable at this time  Plan: Zofran as needed

## 2018-12-16 NOTE — Assessment & Plan Note (Signed)
Assessment: Known IPF Previously failed Esbriet Currently managed on 0FEV 2 weeks of increased diarrhea despite Imodium Monday Wednesday Friday  Plan: 0FEV drug holiday till 12/27/2018 Continue Imodium Monday Wednesday Friday for management of diarrhea, if diarrhea stops then can stop Imodium while off of 0FEV Will need to taper Ofev dose back up if patient is able to tolerate 2-week telephonic outreach with Ashlee Quaker, FNP

## 2018-12-23 DIAGNOSIS — I10 Essential (primary) hypertension: Secondary | ICD-10-CM | POA: Diagnosis not present

## 2018-12-23 DIAGNOSIS — Z299 Encounter for prophylactic measures, unspecified: Secondary | ICD-10-CM | POA: Diagnosis not present

## 2018-12-23 DIAGNOSIS — R350 Frequency of micturition: Secondary | ICD-10-CM | POA: Diagnosis not present

## 2018-12-23 DIAGNOSIS — Z713 Dietary counseling and surveillance: Secondary | ICD-10-CM | POA: Diagnosis not present

## 2018-12-24 ENCOUNTER — Telehealth: Payer: Self-pay | Admitting: Pulmonary Disease

## 2018-12-24 DIAGNOSIS — I10 Essential (primary) hypertension: Secondary | ICD-10-CM | POA: Diagnosis not present

## 2018-12-24 DIAGNOSIS — R1110 Vomiting, unspecified: Secondary | ICD-10-CM | POA: Diagnosis not present

## 2018-12-24 DIAGNOSIS — Z79899 Other long term (current) drug therapy: Secondary | ICD-10-CM | POA: Diagnosis not present

## 2018-12-24 DIAGNOSIS — Z87891 Personal history of nicotine dependence: Secondary | ICD-10-CM | POA: Diagnosis not present

## 2018-12-24 DIAGNOSIS — J8410 Pulmonary fibrosis, unspecified: Secondary | ICD-10-CM | POA: Diagnosis not present

## 2018-12-24 DIAGNOSIS — Z20828 Contact with and (suspected) exposure to other viral communicable diseases: Secondary | ICD-10-CM | POA: Diagnosis not present

## 2018-12-24 DIAGNOSIS — N390 Urinary tract infection, site not specified: Secondary | ICD-10-CM | POA: Diagnosis not present

## 2018-12-24 DIAGNOSIS — J441 Chronic obstructive pulmonary disease with (acute) exacerbation: Secondary | ICD-10-CM | POA: Diagnosis not present

## 2018-12-24 DIAGNOSIS — R0602 Shortness of breath: Secondary | ICD-10-CM | POA: Diagnosis not present

## 2018-12-24 NOTE — Telephone Encounter (Signed)
Agree.  Patient should proceed forward to the emergency room.  I would recommend that due to oxygen saturations patient should be transported via EMS.  Patient will need to go to [place] long.  This could be an IPF flare but due to the worldwide pandemic patient should also be ruled out for COVID-19.  Wyn Quaker, FNP

## 2018-12-24 NOTE — Telephone Encounter (Signed)
See other message

## 2018-12-24 NOTE — Telephone Encounter (Signed)
I am fine with the patient going back on 0FEV 1 tablet daily.  Take with food.  Can use Imodium as needed for diarrhea.  Patient is more than welcome to keep Monday telephone visit with me or we can push this back to later in the week such as Friday or the following week.  What ever is the patient's preference  Wyn Quaker, FNP

## 2018-12-24 NOTE — Telephone Encounter (Signed)
12/24/2018 1403  Triage, Can somebody please contact the patient we recommended that she be transported to the emergency room earlier this morning I am not seeing any notes that she has arrived there.  Want to ensure that she is receiving care for her acute symptoms.  Wyn Quaker, FNP

## 2018-12-24 NOTE — Telephone Encounter (Signed)
Called and spoke with Patient.  Patient stated she just returned home from [facility] ED.  Patient was diagnosed with UTI, given IV antibiotic, placed on 3 liters O2, Covid swab, and labs.  Patient stated she was prescribed a antibiotic to continue taking, with other prescriptions, she was having someone pick up at [facility]. Patient stated she feels much better. Patient stated she felt she should go back on Ofev, at least once per day.  Patient wanted to know what was recommended.  Message routed to [place], NP

## 2018-12-24 NOTE — Telephone Encounter (Signed)
Called listed number x4 but each time I called I received a busy tone and was not able to get through nor able to leave a VM. Will try to call again in a little bit to follow up on call from this morning.

## 2018-12-24 NOTE — Telephone Encounter (Signed)
Spoke with Wyn Quaker in regards to pt and per Aaron Edelman, pt needs to go to [facility] ER via EMS to be further evaluated.  Called and spoke with pt letting her know this info and pt verbalized understanding and stated she would call EMS to get them to take her to ER. Stated to her to tell them to take her to [facility] ER per provider request as she might need to be tested for COVID due to still having diarrhea, increased SOB, and low O2 sats on her O2. Nothing further needed.

## 2018-12-24 NOTE — Telephone Encounter (Addendum)
Called and left detailed message for Patient on VM, with instructions to call office back. Patient has tele visit with Aaron Edelman, NP, 12/26/18, at 1000.

## 2018-12-24 NOTE — Telephone Encounter (Signed)
Primary Pulmonologist: MR Last office visit and with whom: 12/16/2018 with Wyn Quaker What do we see them for (pulmonary problems): IPF Last OV assessment/plan: Instructions  Return in about 2 weeks (around 12/30/2018), or if symptoms worsen or fail to improve, for Follow up with Wyn Quaker FNP-C.  Stop OFEV till 12/27/2018  Continue imodium Monday / Wednesday / Friday   Elevate legs when able   Wear compression stockings when able  >>>maximum amount of time wearing stockings is 16 hours in a 24 hour day   Continue oxygen therapy as prescribed  >>>maintain oxygen saturations greater than 88 percent  >>>if unable to maintain oxygen saturations please contact the office  >>>do not smoke with oxygen  >>>can use nasal saline gel or nasal saline rinses to moisturize nose if oxygen causes dryness  Continue flonase as needed for allergies    Return in about 2 weeks (around 12/30/2018), or if symptoms worsen or fail to improve, for Follow up with Wyn Quaker FNP-C.       Was appointment offered to patient (explain)?  Pt wants recommendations   Reason for call: called and spoke with pt who stated she has had increased SOB, O2 sats have been in the mid 80s while sitting but then when standing, they have dropped to the 70s. Pt stated SOB became worse last night 4/30 around 7am. Pt stated when she showed up to turn on television, pt's sats dropped to 77% on 3L O2. Pt stated while she was currently sitting down, O2 was 87% on 3L and pulse was 85.   Asked pt if she was still off of the OFEV and she stated that she was still of of it but stated due to her breathing worsening, she wondered if she should go back on it. Asked pt how the diarrhea was doing and pt stated two days after last televisit with Aaron Edelman, the diarrhea was bad but then stated it stopped and she did not go at all for a day but then it came back and she had diarrhea all day the following day but then it went away and she did not go  at all for two days and then it came back again. Pt stated it is currently hit or miss with the diarrhea.  Due to pt's O2 being worse, pt is wanting to know recommendations. Aaron Edelman, please advise. Thanks!

## 2018-12-27 ENCOUNTER — Ambulatory Visit (INDEPENDENT_AMBULATORY_CARE_PROVIDER_SITE_OTHER): Payer: Medicare HMO | Admitting: Pulmonary Disease

## 2018-12-27 ENCOUNTER — Other Ambulatory Visit: Payer: Self-pay

## 2018-12-27 ENCOUNTER — Encounter: Payer: Self-pay | Admitting: Pulmonary Disease

## 2018-12-27 DIAGNOSIS — Z299 Encounter for prophylactic measures, unspecified: Secondary | ICD-10-CM | POA: Diagnosis not present

## 2018-12-27 DIAGNOSIS — J849 Interstitial pulmonary disease, unspecified: Secondary | ICD-10-CM

## 2018-12-27 DIAGNOSIS — Z6823 Body mass index (BMI) 23.0-23.9, adult: Secondary | ICD-10-CM | POA: Diagnosis not present

## 2018-12-27 DIAGNOSIS — K521 Toxic gastroenteritis and colitis: Secondary | ICD-10-CM | POA: Diagnosis not present

## 2018-12-27 DIAGNOSIS — N390 Urinary tract infection, site not specified: Secondary | ICD-10-CM | POA: Diagnosis not present

## 2018-12-27 DIAGNOSIS — R350 Frequency of micturition: Secondary | ICD-10-CM | POA: Diagnosis not present

## 2018-12-27 DIAGNOSIS — I10 Essential (primary) hypertension: Secondary | ICD-10-CM | POA: Diagnosis not present

## 2018-12-27 NOTE — Assessment & Plan Note (Signed)
Assessment:  Known IPF  Previously failed Esbriet  Recent drug holiday from [facility] due to diarrhea  Recent ER visit on 12/24/2018 for UTI  Restarted OFEV on 12/24/2018 daily  Imodium MWF   Plan:  Continue OFEV daily till 01/08/2019, on 01/08/2019 pt will resume full OFEV dosing  Continue Imodium MWF  8 week follow up with Wyn Quaker FNP or Dr. Chase Caller

## 2018-12-27 NOTE — Patient Instructions (Addendum)
Contact PCP for follow up appointment following ER admission  >>>discuss with PCP recurrent UTIs   Continue antibiotic from [facility] ER   Continue OFEV daily for 2 weeks, resume full dosing - 1 tablet twice daily 01/08/2019  Continue imodium Monday / Wednesday / Friday   Contact our office if diarrhea worsens contact our office   Wear compression stockings when able  >>>maximum amount of time wearing stockings is 16 hours in a 24 hour day   Continue oxygen therapy as prescribed  >>>maintain oxygen saturations greater than 88 percent  >>>if unable to maintain oxygen saturations please contact the office  >>>do not smoke with oxygen  >>>can use nasal saline gel or nasal saline rinses to moisturize nose if oxygen causes dryness  Continue flonase as needed for allergies   Return in about 2 months (around 02/26/2019), or if symptoms worsen or fail to improve, for Follow up with Dr. Purnell Shoemaker, Follow up with Ashlee Martin.  Coronavirus (COVID-19) Are you at risk?  Are you at risk for the Coronavirus (COVID-19)?  To be considered HIGH RISK for Coronavirus (COVID-19), you have to meet the following criteria:  . Traveled to [place], [place], [place], [place] or [place]; or in the [place] to [place], [place], [place], or [place]; and have fever, cough, and shortness of breath within the last 2 weeks of travel OR . Been in close contact with a person diagnosed with COVID-19 within the last 2 weeks and have fever, cough, and shortness of breath . IF YOU DO NOT MEET THESE CRITERIA, YOU ARE CONSIDERED LOW RISK FOR COVID-19.  What to do if you are HIGH RISK for COVID-19?  Marland Kitchen If you are having a medical emergency, call 911. . Seek medical care right away. Before you go to a doctor's office, urgent care or emergency department, call ahead and tell them about your recent travel, contact with someone diagnosed with COVID-19, and your symptoms. You should receive instructions from your  physician's office regarding next steps of care.  . When you arrive at healthcare provider, tell the healthcare staff immediately you have returned from visiting [place], [place], [place], [place] or [place]; or traveled in the [place] to [place], [place], [place], or [place]; in the last two weeks or you have been in close contact with a person diagnosed with COVID-19 in the last 2 weeks.   . Tell the health care staff about your symptoms: fever, cough and shortness of breath. . After you have been seen by a medical provider, you will be either: o Tested for (COVID-19) and discharged home on quarantine except to seek medical care if symptoms worsen, and asked to  - Stay home and avoid contact with others until you get your results (4-5 days)  - Avoid travel on public transportation if possible (such as bus, train, or airplane) or o Sent to the Emergency Department by EMS for evaluation, COVID-19 testing, and possible admission depending on your condition and test results.  What to do if you are LOW RISK for COVID-19?  Reduce your risk of any infection by using the same precautions used for avoiding the common cold or flu:  Marland Kitchen Wash your hands often with soap and warm water for at least 20 seconds.  If soap and water are not readily available, use an alcohol-based hand sanitizer with at least 60% alcohol.  . If coughing or sneezing, cover your mouth and nose by coughing or sneezing into the  elbow areas of your shirt or coat, into a tissue or into your sleeve (not your hands). . Avoid shaking hands with others and consider head nods or verbal greetings only. . Avoid touching your eyes, nose, or mouth with unwashed hands.  . Avoid close contact with people who are sick. . Avoid places or events with large numbers of people in one location, like concerts or sporting events. . Carefully consider travel plans you have or are making. . If you are planning any travel outside or inside the [place],  visit the CDC's Travelers' Health webpage for the latest health notices. . If you have some symptoms but not all symptoms, continue to monitor at home and seek medical attention if your symptoms worsen. . If you are having a medical emergency, call 911.   [place] / e-Visit: [email or web address]         [facility]: [facility]: [phone]                   [facility]: [phone]           It is flu season:   >>> Best ways to protect herself from the flu: Receive the yearly flu vaccine, practice good hand hygiene washing with soap and also using hand sanitizer when available, eat a nutritious meals, get adequate rest, hydrate appropriately   Please contact the office if your symptoms worsen or you have concerns that you are not improving.   Thank you for choosing [facility] for your healthcare, and for allowing [place] to partner with you on your healthcare journey. I am thankful to be able to provide care to you today.   Ashlee Martin

## 2018-12-27 NOTE — Assessment & Plan Note (Signed)
Assessment:  Recent drug holiday from [facility]  Improved symptoms   Plan:  Resume OFEV daily till 01/08/2019, resume full dosing on 01/08/2019

## 2018-12-27 NOTE — Progress Notes (Signed)
Virtual Visit via Telephone Note  I connected with Ashlee Martin on 12/27/18 at 10:00 AM EDT by telephone and verified that I am speaking with the correct person using two identifiers.   I discussed the limitations, risks, security and privacy concerns of performing an evaluation and management service by telephone and the availability of in person appointments. I also discussed with the patient that there may be a patient responsible charge related to this service. The patient expressed understanding and agreed to proceed.   History of Present Illness: 85 year old female former smoker followed in our office for idiopathic pulmonary fibrosis (failed Esbriet, currently on 0FEV) Smoker/ Smoking History: Former smoker. Quit 1974. 60-pack-year smoking history Maintenance: OFEV Pt of: Dr. Chase Caller   Patient consented to consult via telephone: Yes  People present and their role in pt care: Pt   Chief complaint: ER follow up / 2 week follow up for OFEV   85 year old female followed in our office for interstitial lung disease/IPF.  Patient is currently maintained on 0FEV.  Patient is on daily dosing as of 12/24/2018 as she currently completed a one-week drug holiday due to worsening diarrhea.  Patient also presented to an emergency room on 12/24/2018 at [facility] for UTI.  Patient was swabbed and was negative for COVID-19.  Patient resumed her OFEV dosing on her own accord on 12/24/2018.  Patient reports that she feels significantly better since the drug holiday from [facility] as well as since starting antibiotics from the emergency room.  She has yet to follow-up with primary care after her emergency room visit.  Patient reports improved appetite as well as improved symptoms all around.  Patient plans to contact primary care today.   Observations/Objective:  12/24/2018- weight - 137lbs  12/24/2018-SARS-CoV-2-negative  06/15/18 -CT chest high-res- previously noted left Toft lobe nodules completely resolved, chronic  changes of interstitial lung disease again considered probable UIP  No results found for: NITRICOXIDE  Assessment and Plan:  ILD (interstitial lung disease) ([place]) Assessment:  Known IPF  Previously failed Esbriet  Recent drug holiday from [facility] due to diarrhea  Recent ER visit on 12/24/2018 for UTI  Restarted OFEV on 12/24/2018 daily  Imodium MWF   Plan:  Continue OFEV daily till 01/08/2019, on 01/08/2019 pt will resume full OFEV dosing  Continue Imodium MWF  8 week follow up with Wyn Quaker FNP or Dr. Chase Caller   Drug-induced diarrhea Assessment:  Recent drug holiday from [facility]  Improved symptoms   Plan:  Resume OFEV daily till 01/08/2019, resume full dosing on 01/08/2019   UTI (urinary tract infection) Assessment: Recent UTI causing emergency room visit on 12/24/2018 Patient reporting she has a UTI every month  Plan: Continue antibiotics from [facility] ER Schedule follow-up with primary care Discuss recurrent UTIs with primary care  Follow Up Instructions:  Return in about 2 months (around 02/26/2019), or if symptoms worsen or fail to improve, for Follow up with Dr. Purnell Shoemaker, Follow up with Wyn Quaker FNP-C.    I discussed the assessment and treatment plan with the patient. The patient was provided an opportunity to ask questions and all were answered. The patient agreed with the plan and demonstrated an understanding of the instructions.   The patient was advised to call back or seek an in-person evaluation if the symptoms worsen or if the condition fails to improve as anticipated.  I provided 23 minutes of non-face-to-face time during this encounter.   Lauraine Rinne, NP

## 2018-12-27 NOTE — Assessment & Plan Note (Signed)
Assessment: Recent UTI causing emergency room visit on 12/24/2018 Patient reporting she has a UTI every month  Plan: Continue antibiotics from [facility] ER Schedule follow-up with primary care Discuss recurrent UTIs with primary care

## 2019-01-05 DIAGNOSIS — Z96649 Presence of unspecified artificial hip joint: Secondary | ICD-10-CM | POA: Diagnosis not present

## 2019-01-05 DIAGNOSIS — R0902 Hypoxemia: Secondary | ICD-10-CM | POA: Diagnosis not present

## 2019-01-05 DIAGNOSIS — J849 Interstitial pulmonary disease, unspecified: Secondary | ICD-10-CM | POA: Diagnosis not present

## 2019-01-05 DIAGNOSIS — M1611 Unilateral primary osteoarthritis, right hip: Secondary | ICD-10-CM | POA: Diagnosis not present

## 2019-02-02 ENCOUNTER — Encounter: Payer: Self-pay | Admitting: Gastroenterology

## 2019-02-02 ENCOUNTER — Ambulatory Visit (INDEPENDENT_AMBULATORY_CARE_PROVIDER_SITE_OTHER): Payer: Medicare HMO | Admitting: Gastroenterology

## 2019-02-02 ENCOUNTER — Other Ambulatory Visit: Payer: Self-pay

## 2019-02-02 VITALS — BP 136/78 | HR 67 | Temp 98.1°F | Ht 65.0 in | Wt 134.8 lb

## 2019-02-02 DIAGNOSIS — K521 Toxic gastroenteritis and colitis: Secondary | ICD-10-CM | POA: Diagnosis not present

## 2019-02-02 DIAGNOSIS — R112 Nausea with vomiting, unspecified: Secondary | ICD-10-CM

## 2019-02-02 MED ORDER — PANTOPRAZOLE SODIUM 40 MG PO TBEC: 40.0000 mg | DELAYED_RELEASE_TABLET | Freq: Two times a day (BID) | ORAL | 3 refills | Status: DC

## 2019-02-02 NOTE — Progress Notes (Signed)
Referring Provider: Glenda Chroman, MD Primary Care Physician:  Glenda Chroman, MD  Primary GI: Dr. Oneida Alar   Chief Complaint  Patient presents with  . Diarrhea    Nausea,Gerd,Abd pain    HPI:   Ashlee Martin is an 85 y.o. female presenting today with a history of chronic N/V, solid and liquid dysphagia. EGD/dilation arranged Aug 2019 with benign-appearing, intrinsic mild stenosis, s/p dilation. Mild inflammation entire stomach, s/p biopsy that was negative for H.pylori. Pylorus normal but had changes consistent with PRIOR ulcer disease. Duodenum normal.   Known history of fatty liver, with ultrasound recently in Aug 2019. Area of likely fatty sparing with repeat US not appreciating area of focal fatty sparing well.    Episode of N/V/D a few weeks ago for 3-4 days in a row. Yesterday felt queasy until 3am and had to vomit with diarrhea. Several episodes.  Diarrhea is to the point where she has incontinent episodes. Loose stools 3-5 per day. Will take anti-diarrhea, imodium Mon, Wed, Friday. Feels like the OFEV is causing the diarrhea. Epigastric pain. Lower abdominal cramping. Noted all these symptoms with OFEV starting. When taken off OFEV, had normal BM. The diarrhea is persistent.   Oxygen 2 liters at rest. Will increase to 3 liters if moving around.   Past Medical History:  Diagnosis Date  . Arthritis   . Depression   . GERD (gastroesophageal reflux disease)   . Hypercholesterolemia   . Hypertension   . ILD (interstitial lung disease) ([place])   . Psoriasis    had been on Humira but continued to have UTIs, sinus infections, etc. Taken off  . Seizure ([place])    had 3-4 seizures, 8 years ago, placed on medication and has had no seizures since.  . Seizures ([place])     Past Surgical History:  Procedure Laterality Date  . BIOPSY  04/20/2018   Procedure: BIOPSY;  Surgeon: Danie Binder, MD;  Location: [facility];  Service: Endoscopy;;  gastric  . COLONOSCOPY  01/2011   Dr.  Anthony Sar: normal.   . DILATION AND CURETTAGE OF UTERUS     x3  . ESOPHAGOGASTRODUODENOSCOPY (EGD) WITH PROPOFOL N/A 04/20/2018   benign-appearing, intrinsic mild stenosis, s/p dilation. Mild inflammation entire stomach, s/p biopsy that was negative for H.pylori. Pylorus normal but had changes consistent with PRIOR ulcer disease. Duodenum normal.   . SAVORY DILATION N/A 04/20/2018   Procedure: SAVORY DILATION;  Surgeon: Danie Binder, MD;  Location: [facility];  Service: Endoscopy;  Laterality: N/A;  . VIDEO BRONCHOSCOPY Bilateral 01/07/2018   Procedure: VIDEO BRONCHOSCOPY WITHOUT FLUORO;  Surgeon: Brand Males, MD;  Location: [facility];  Service: Cardiopulmonary;  Laterality: Bilateral;  . WRIST SURGERY Right    ORIF    Current Outpatient Medications  Medication Sig Dispense Refill  . atenolol (TENORMIN) 25 MG tablet Take 25 mg by mouth daily.     . Cranberry Fruit Concentrate 12600 MG CAPS Take by mouth 2 (two) times a day.    Marland Kitchen doxylamine, Sleep, (UNISOM) 25 MG tablet Take 25 mg by mouth at bedtime.     Marland Kitchen Histamine Dihydrochloride (AUSTRALIAN DREAM ARTHRITIS EX) Apply 1 application topically 3 (three) times daily as needed (for pain.).    Marland Kitchen lactobacillus acidophilus (BACID) TABS tablet Take 1 tablet by mouth daily.    Marland Kitchen loperamide (IMODIUM A-D) 2 MG tablet Take 2 mg by mouth as needed for diarrhea or loose stools. Takes 1 to 3 times a  week    . Nintedanib (OFEV) 150 MG CAPS Take 150 mg by mouth 2 (two) times daily.    . ondansetron (ZOFRAN) 4 MG tablet Take 1 tablet (4 mg total) by mouth 2 (two) times daily as needed for nausea or vomiting. 30 tablet 1  . pantoprazole (PROTONIX) 40 MG tablet Take 1 tablet (40 mg total) by mouth daily. 30 minutes before breakfast 90 tablet 3  . rOPINIRole (REQUIP) 2 MG tablet Take 2 mg by mouth at bedtime.     . Simethicone (GAS-X PO) Take by mouth as needed.    . simvastatin (ZOCOR) 20 MG tablet Take 20 mg by mouth at bedtime.     . vitamin B-12  (CYANOCOBALAMIN) 1000 MCG tablet Take 1,000 mcg by mouth as needed.     . VENTOLIN HFA 108 (90 Base) MCG/ACT inhaler Inhale 1-2 puffs into the lungs every 6 (six) hours as needed (chest congestion/cough).   0   No current facility-administered medications for this visit.     Allergies as of 02/02/2019 - Review Complete 02/02/2019  Allergen Reaction Noted  . Humira [adalimumab] Other (See Comments) 11/03/2017  . Pirfenidone Other (See Comments) 04/27/2018    Family History  Problem Relation Age of Onset  . Heart disease Mother   . Heart disease Father   . Cancer Sister        GYN  . Colon cancer Neg Hx     Social History   Socioeconomic History  . Marital status: Married    Spouse name: Not on file  . Number of children: Not on file  . Years of education: Not on file  . Highest education level: Not on file  Occupational History  . Occupation: Retired  Scientific laboratory technician  . Financial resource strain: Not on file  . Food insecurity:    Worry: Not on file    Inability: Not on file  . Transportation needs:    Medical: Not on file    Non-medical: Not on file  Tobacco Use  . Smoking status: Former Smoker    Packs/day: 3.00    Years: 20.00    Pack years: 60.00    Types: Cigarettes    Last attempt to quit: 11/03/1972    Years since quitting: 46.2  . Smokeless tobacco: Never Used  Substance and Sexual Activity  . Alcohol use: Yes    Alcohol/week: 7.0 standard drinks    Types: 7 Shots of liquor per week    Comment: May have before bed  . Drug use: Never  . Sexual activity: Yes    Birth control/protection: None  Lifestyle  . Physical activity:    Days per week: Not on file    Minutes per session: Not on file  . Stress: Not on file  Relationships  . Social connections:    Talks on phone: Not on file    Gets together: Not on file    Attends religious service: Not on file    Active member of club or organization: Not on file    Attends meetings of clubs or organizations:  Not on file    Relationship status: Not on file  Other Topics Concern  . Not on file  Social History Narrative  . Not on file    Review of Systems: As mentioned in HPI   Physical Exam: BP 136/78   Pulse 67   Temp 98.1 F (36.7 C)   Ht _0  (1.651 m)   Wt 134 lb 12.8  oz (61.1 kg)   BMI 22.43 kg/m  General:   Alert and oriented. No distress noted. Pleasant and cooperative. On continuous nasal cannula oxygen Head:  Normocephalic and atraumatic. Eyes:  Conjuctiva clear without scleral icterus. Mouth:  Oral mucosa pink and moist.  Abdomen:  +BS, soft, non-tender and non-distended. No rebound or guarding. No HSM or masses noted. Msk:  Symmetrical without gross deformities. Normal posture. Extremities:  Without edema. Neurologic:  Alert and  oriented x4 Psych:  Alert and cooperative. Normal mood and affect.

## 2019-02-02 NOTE — Patient Instructions (Signed)
I have increased Protonix to twice a day, 30 minutes before breakfast and dinner.   After you speak with the pulmonologist, let us know if your diarrhea, nausea, and vomiting continue.   We will see you in 2-3 months!  I am so sorry for your wait today. Thank you for your patience!  I enjoyed seeing you again today! As you know, I value our relationship and want to provide genuine, compassionate, and quality care. I welcome your feedback. If you receive a survey regarding your visit,  I greatly appreciate you taking time to fill this out. See you next time!  Annitta Needs, PhD, ANP-BC [facility]

## 2019-02-05 DIAGNOSIS — Z96649 Presence of unspecified artificial hip joint: Secondary | ICD-10-CM | POA: Diagnosis not present

## 2019-02-05 DIAGNOSIS — M1611 Unilateral primary osteoarthritis, right hip: Secondary | ICD-10-CM | POA: Diagnosis not present

## 2019-02-05 DIAGNOSIS — J849 Interstitial pulmonary disease, unspecified: Secondary | ICD-10-CM | POA: Diagnosis not present

## 2019-02-05 DIAGNOSIS — R0902 Hypoxemia: Secondary | ICD-10-CM | POA: Diagnosis not present

## 2019-02-07 NOTE — Progress Notes (Signed)
cc'ed to pcp

## 2019-02-07 NOTE — Assessment & Plan Note (Signed)
Noted resolution of diarrhea when taking a drug holiday off of OFEV. I have asked her to touch base with pulmonologist. If continues, will need stool studies.

## 2019-02-07 NOTE — Assessment & Plan Note (Signed)
Query uncontrolled GERD. Increase PPI to BID. Close follow-up in 2-3 months.

## 2019-02-15 ENCOUNTER — Telehealth: Payer: Self-pay | Admitting: Gastroenterology

## 2019-02-15 NOTE — Telephone Encounter (Signed)
Pt said she forgot to tell Vicente Males that she has occasional rectal bleed, sometimes 3-4 times a week. Last time was Friday night, when she was a little constipated. She said it is not a lot but she wanted to let Vicente Males know.

## 2019-02-15 NOTE — Telephone Encounter (Signed)
Patient called to say that she failed to mention to [facility] at her last appointment, that she had been having blood in her stool occasionally when she goes to the restroom.  Ashlee Martin she has not had any since Friday evening.

## 2019-02-15 NOTE — Progress Notes (Signed)
REVIEWED-NO ADDITIONAL RECOMMENDATIONS. 

## 2019-02-18 MED ORDER — HYDROCORTISONE (PERIANAL) 2.5 % EX CREA: 1.0000 "application " | TOPICAL_CREAM | Freq: Two times a day (BID) | CUTANEOUS | 1 refills | Status: DC

## 2019-02-18 NOTE — Addendum Note (Signed)
Addended by: Annitta Needs on: 02/18/2019 12:06 PM   Modules accepted: Orders

## 2019-02-18 NOTE — Telephone Encounter (Signed)
Noted. She had a colonoscopy in 2012 by Dr. Anthony Sar that was normal. Any rectal discomfort, itching, burning? I hope her diarrhea is improved. Could consider colonoscopy but may need to treat supportively due to multiple comorbidities. If persistent bleeding, significant bleeding, or anemia, would discuss risks and benefits. I have sent in Anusol to take twice a day for the next 7 days. If no recent CBC, let's get that on file.

## 2019-02-21 NOTE — Telephone Encounter (Signed)
LMOM to call.

## 2019-02-22 ENCOUNTER — Other Ambulatory Visit: Payer: Self-pay

## 2019-02-22 DIAGNOSIS — K625 Hemorrhage of anus and rectum: Secondary | ICD-10-CM

## 2019-02-22 NOTE — Telephone Encounter (Signed)
Pt is aware. She has not had any rectal discomfort, itching or burning.  Not as much diarrhea, but still one to two loose stools daily.  Not had any bleeding in a week or more. She was aware of the Anusol. She will be going for a physical with Dr.Vyas and will have CBC done when she goes there. I am mailing the order to her to take with her to physical.

## 2019-02-24 ENCOUNTER — Ambulatory Visit: Payer: Medicare HMO | Admitting: Pulmonary Disease

## 2019-02-27 NOTE — Progress Notes (Signed)
Virtual Visit via Telephone Note  I connected with Karlee A Curling on 02/28/19 at 10:30 AM EDT by telephone and verified that I am speaking with the correct person using two identifiers.  Location: Patient: Home Provider: Office [facility] - [address], [place], [place], [place] [address]   I discussed the limitations, risks, security and privacy concerns of performing an evaluation and management service by telephone and the availability of in person appointments. I also discussed with the patient that there may be a patient responsible charge related to this service. The patient expressed understanding and agreed to proceed.  Patient consented to consult via telephone: Yes People present and their role in pt care: Pt    History of Present Illness:  85 year old female former smoker followed in our office for idiopathic pulmonary fibrosis (failed Esbriet, currently on 0FEV) Smoker/ Smoking History: Former smoker. Quit 1974. 60-pack-year smoking history Maintenance: OFEV Pt of: Dr. Chase Caller   Chief complaint: 2mofollow up for ILD    85year old female former smoker followed in our office for IPF.  Patient reports that she has been doing well over the last 2 months since we last worked with her.  Patient is now back on OFF full dosing.  She reports that she still has bouts of diarrhea but this is managed with Imodium and other antidiarrheals.  Patient actually feels that she is feeling better than she is felt in a while definitely better than January February 2020 prior to all of her drug holidays.  Patient continues to wear her oxygen.  She has no current complaints or issues at this time.  Observations/Objective:  12/24/2018-SARS-CoV-2-negative  06/15/18 -CT chest high-res- previously noted left [place] lobe nodules completely resolved, chronic changes of interstitial lung disease again considered probable UIP  Assessment and Plan:  ILD (interstitial lung disease)  [place] Assessment: Known IPF Previously failed Esbriet [place] drug [place] to diarrhea in March/April/May 2020 Imodium Monday Wednesday Friday Has been maintained on Ofev twice daily since May/2020  Plan: Continue OFEV as prescribed Continue Imodium Monday Wednesday Friday 365-monthollow-up with Dr. RaChase Callere will coordinate with primary care to ensure patient gets hepatic function panel drawn at physical  Therapeutic drug monitoring Plan: Patient will get hepatic function panel drawn at primary care office during her physical   Follow Up Instructions:  Return in about 3 months (around 05/31/2019), or if symptoms worsen or fail to improve, for Follow up with Dr. RaPurnell Shoemaker  I discussed the assessment and treatment plan with the patient. The patient was provided an opportunity to ask questions and all were answered. The patient agreed with the plan and demonstrated an understanding of the instructions.   The patient was advised to call back or seek an in-person evaluation if the symptoms worsen or if the condition fails to improve as anticipated.  I provided 22 minutes of non-face-to-face time during this encounter.   BrLauraine RinneNP

## 2019-02-28 ENCOUNTER — Encounter: Payer: Self-pay | Admitting: Pulmonary Disease

## 2019-02-28 ENCOUNTER — Ambulatory Visit (INDEPENDENT_AMBULATORY_CARE_PROVIDER_SITE_OTHER): Payer: Medicare HMO | Admitting: Pulmonary Disease

## 2019-02-28 ENCOUNTER — Other Ambulatory Visit: Payer: Self-pay

## 2019-02-28 DIAGNOSIS — Z5181 Encounter for therapeutic drug level monitoring: Secondary | ICD-10-CM | POA: Diagnosis not present

## 2019-02-28 DIAGNOSIS — J849 Interstitial pulmonary disease, unspecified: Secondary | ICD-10-CM | POA: Diagnosis not present

## 2019-02-28 NOTE — Patient Instructions (Addendum)
We will contact your PCP about getting blood work completed there  >>>Hepatic Function Panel   Continue OFEV twice daily  Continue imodium Monday / Wednesday / Friday   Contact our office if diarrhea worsens contact our office   Wear compression stockings when able  >>>maximum amount of time wearing stockings is 16 hours in a 24 hour day  Continue oxygen therapy as prescribed  >>>maintain oxygen saturations greater than 88 percent  >>>if unable to maintain oxygen saturations please contact the office  >>>do not smoke with oxygen  >>>can use nasal saline gel or nasal saline rinses to moisturize nose if oxygen causes dryness  Continue flonase as needed for allergies   Return in about 3 months (around 05/31/2019), or if symptoms worsen or fail to improve, for Follow up with Dr. Purnell Shoemaker.   Coronavirus (COVID-19) Are you at risk?  Are you at risk for the Coronavirus (COVID-19)?  To be considered HIGH RISK for Coronavirus (COVID-19), you have to meet the following criteria:  . Traveled to [place], [place], [place], [place] or [place]; or in the [place] to [place], [place], [place], or [place]; and have fever, cough, and shortness of breath within the last 2 weeks of travel OR . Been in close contact with a person diagnosed with COVID-19 within the last 2 weeks and have fever, cough, and shortness of breath . IF YOU DO NOT MEET THESE CRITERIA, YOU ARE CONSIDERED LOW RISK FOR COVID-19.  What to do if you are HIGH RISK for COVID-19?  Marland Kitchen If you are having a medical emergency, call 911. . Seek medical care right away. Before you go to a doctor's office, urgent care or emergency department, call ahead and tell them about your recent travel, contact with someone diagnosed with COVID-19, and your symptoms. You should receive instructions from your physician's office regarding next steps of care.  . When you arrive at healthcare provider, tell the healthcare staff  immediately you have returned from visiting [place], [place], [place], [place] or [place]; or traveled in the [place] to [place], [place], [place], or [place]; in the last two weeks or you have been in close contact with a person diagnosed with COVID-19 in the last 2 weeks.   . Tell the health care staff about your symptoms: fever, cough and shortness of breath. . After you have been seen by a medical provider, you will be either: o Tested for (COVID-19) and discharged home on quarantine except to seek medical care if symptoms worsen, and asked to  - Stay home and avoid contact with others until you get your results (4-5 days)  - Avoid travel on public transportation if possible (such as bus, train, or airplane) or o Sent to the Emergency Department by EMS for evaluation, COVID-19 testing, and possible admission depending on your condition and test results.  What to do if you are LOW RISK for COVID-19?  Reduce your risk of any infection by using the same precautions used for avoiding the common cold or flu:  Marland Kitchen Wash your hands often with soap and warm water for at least 20 seconds.  If soap and water are not readily available, use an alcohol-based hand sanitizer with at least 60% alcohol.  . If coughing or sneezing, cover your mouth and nose by coughing or sneezing into the elbow areas of your shirt or coat, into a tissue or into your sleeve (not your hands). . Avoid shaking hands with others and  consider head nods or verbal greetings only. . Avoid touching your eyes, nose, or mouth with unwashed hands.  . Avoid close contact with people who are sick. . Avoid places or events with large numbers of people in one location, like concerts or sporting events. . Carefully consider travel plans you have or are making. . If you are planning any travel outside or inside the [place], visit the CDC's Travelers' Health webpage for the latest health notices. . If you have some symptoms but not all  symptoms, continue to monitor at home and seek medical attention if your symptoms worsen. . If you are having a medical emergency, call 911.   [place] / e-Visit: [email or web address]         [facility]: [facility]: [phone]                   [facility]: [phone]           It is flu season:   >>> Best ways to protect herself from the flu: Receive the yearly flu vaccine, practice good hand hygiene washing with soap and also using hand sanitizer when available, eat a nutritious meals, get adequate rest, hydrate appropriately   Please contact the office if your symptoms worsen or you have concerns that you are not improving.   Thank you for choosing [facility] for your healthcare, and for allowing [place] to partner with you on your healthcare journey. I am thankful to be able to provide care to you today.   Wyn Quaker FNP-C

## 2019-02-28 NOTE — Assessment & Plan Note (Signed)
Assessment: Known IPF Previously failed Esbriet Multiple drug holidays from [place] due to diarrhea in March/April/May 2020 Imodium Monday Wednesday Friday Has been maintained on Ofev twice daily since May/2020  Plan: Continue OFEV as prescribed Continue Imodium Monday Wednesday Friday 50-monthfollow-up with Dr. RChase CallerWe will coordinate with primary care to ensure patient gets hepatic function panel drawn at physical

## 2019-02-28 NOTE — Assessment & Plan Note (Signed)
Plan: Patient will get hepatic function panel drawn at primary care office during her physical

## 2019-03-02 DIAGNOSIS — J8410 Pulmonary fibrosis, unspecified: Secondary | ICD-10-CM | POA: Diagnosis not present

## 2019-03-02 DIAGNOSIS — Z299 Encounter for prophylactic measures, unspecified: Secondary | ICD-10-CM | POA: Diagnosis not present

## 2019-03-02 DIAGNOSIS — Z6821 Body mass index (BMI) 21.0-21.9, adult: Secondary | ICD-10-CM | POA: Diagnosis not present

## 2019-03-02 DIAGNOSIS — N390 Urinary tract infection, site not specified: Secondary | ICD-10-CM | POA: Diagnosis not present

## 2019-03-02 DIAGNOSIS — R350 Frequency of micturition: Secondary | ICD-10-CM | POA: Diagnosis not present

## 2019-03-02 DIAGNOSIS — I10 Essential (primary) hypertension: Secondary | ICD-10-CM | POA: Diagnosis not present

## 2019-03-04 ENCOUNTER — Telehealth: Payer: Self-pay | Admitting: Gastroenterology

## 2019-03-04 DIAGNOSIS — K625 Hemorrhage of anus and rectum: Secondary | ICD-10-CM | POA: Diagnosis not present

## 2019-03-04 NOTE — Telephone Encounter (Signed)
[phone] please call patient, she stated that the blood in her stool has gotten worse.

## 2019-03-04 NOTE — Telephone Encounter (Signed)
Agree, no further recommendations at this time.

## 2019-03-04 NOTE — Telephone Encounter (Addendum)
PT reports a blood in stool a little more often.  She has loose stools and sometimes 1-2 a day and sometimes 2-4 a day.  She does not have blood in stool every time but it is happening a little more frequently.  She has not had the CBC ordered by [name] on 02/22/2019 yet, she was planning to do with PCP and hasn't been there yet.   I spoke with Walden Field, NP in Anna's absence today and I am faxing the lab order to [facility] for her to do at the hospital today. ( I faxed it to [facility] @ [phone].) and received a confirmation.   I called the hospital and confirmed that it had been received.

## 2019-03-07 DIAGNOSIS — M1611 Unilateral primary osteoarthritis, right hip: Secondary | ICD-10-CM | POA: Diagnosis not present

## 2019-03-07 DIAGNOSIS — J849 Interstitial pulmonary disease, unspecified: Secondary | ICD-10-CM | POA: Diagnosis not present

## 2019-03-07 DIAGNOSIS — R0902 Hypoxemia: Secondary | ICD-10-CM | POA: Diagnosis not present

## 2019-03-07 DIAGNOSIS — Z96649 Presence of unspecified artificial hip joint: Secondary | ICD-10-CM | POA: Diagnosis not present

## 2019-03-09 MED ORDER — HYDROCORTISONE (PERIANAL) 2.5 % EX CREA: 1.0000 "application " | TOPICAL_CREAM | Freq: Two times a day (BID) | CUTANEOUS | 1 refills | Status: DC

## 2019-03-09 NOTE — Telephone Encounter (Signed)
03/04/19: outside labs with macrocytic anemia.  Hgb 10.6. Last we have on file with Hgb 12.01 April 2018 (still macrocytic indices at that time).   OFEV seemed to be causing diarrhea historically. I believe she is back on this. If still having persistent loose, watery, stools, needs Cdiff, culture, giardia (doubt infectious but need this).  With intermittent hematochezia, anemia, diarrhea, would favor colonoscopy with Propofol as last was in 2012. Would await stool studies first. Use anusol BID per rectum for 7 days, then take a break. Please have stool studies first and we will go form there.

## 2019-03-09 NOTE — Addendum Note (Signed)
Addended by: Annitta Needs on: 03/09/2019 02:16 PM   Modules accepted: Orders

## 2019-03-10 NOTE — Telephone Encounter (Signed)
Pt notified of AB's recommendations. Pt is going to have stool studies done Monday.

## 2019-03-14 DIAGNOSIS — E785 Hyperlipidemia, unspecified: Secondary | ICD-10-CM | POA: Diagnosis not present

## 2019-03-14 DIAGNOSIS — I10 Essential (primary) hypertension: Secondary | ICD-10-CM | POA: Diagnosis not present

## 2019-03-14 DIAGNOSIS — Z1211 Encounter for screening for malignant neoplasm of colon: Secondary | ICD-10-CM | POA: Diagnosis not present

## 2019-03-14 DIAGNOSIS — Z1339 Encounter for screening examination for other mental health and behavioral disorders: Secondary | ICD-10-CM | POA: Diagnosis not present

## 2019-03-14 DIAGNOSIS — Z79899 Other long term (current) drug therapy: Secondary | ICD-10-CM | POA: Diagnosis not present

## 2019-03-14 DIAGNOSIS — R5383 Other fatigue: Secondary | ICD-10-CM | POA: Diagnosis not present

## 2019-03-14 DIAGNOSIS — Z7189 Other specified counseling: Secondary | ICD-10-CM | POA: Diagnosis not present

## 2019-03-14 DIAGNOSIS — Z6821 Body mass index (BMI) 21.0-21.9, adult: Secondary | ICD-10-CM | POA: Diagnosis not present

## 2019-03-14 DIAGNOSIS — Z0000 Encounter for general adult medical examination without abnormal findings: Secondary | ICD-10-CM | POA: Diagnosis not present

## 2019-03-14 DIAGNOSIS — Z299 Encounter for prophylactic measures, unspecified: Secondary | ICD-10-CM | POA: Diagnosis not present

## 2019-03-14 DIAGNOSIS — Z1331 Encounter for screening for depression: Secondary | ICD-10-CM | POA: Diagnosis not present

## 2019-03-16 DIAGNOSIS — E876 Hypokalemia: Secondary | ICD-10-CM | POA: Diagnosis not present

## 2019-04-07 DIAGNOSIS — M1611 Unilateral primary osteoarthritis, right hip: Secondary | ICD-10-CM | POA: Diagnosis not present

## 2019-04-07 DIAGNOSIS — R0902 Hypoxemia: Secondary | ICD-10-CM | POA: Diagnosis not present

## 2019-04-07 DIAGNOSIS — Z96649 Presence of unspecified artificial hip joint: Secondary | ICD-10-CM | POA: Diagnosis not present

## 2019-04-07 DIAGNOSIS — J849 Interstitial pulmonary disease, unspecified: Secondary | ICD-10-CM | POA: Diagnosis not present

## 2019-04-13 DIAGNOSIS — E876 Hypokalemia: Secondary | ICD-10-CM | POA: Diagnosis not present

## 2019-04-19 ENCOUNTER — Other Ambulatory Visit (HOSPITAL_COMMUNITY)
Admission: RE | Admit: 2019-04-19 | Discharge: 2019-04-19 | Disposition: A | Discharge status: Home / Self Care | Payer: Medicare HMO | Source: Ambulatory Visit | Attending: Urology | Admitting: Urology

## 2019-04-19 ENCOUNTER — Ambulatory Visit (INDEPENDENT_AMBULATORY_CARE_PROVIDER_SITE_OTHER): Payer: Medicare HMO | Admitting: Urology

## 2019-04-19 DIAGNOSIS — N3000 Acute cystitis without hematuria: Secondary | ICD-10-CM

## 2019-04-19 DIAGNOSIS — N952 Postmenopausal atrophic vaginitis: Secondary | ICD-10-CM

## 2019-04-20 ENCOUNTER — Other Ambulatory Visit: Payer: Self-pay | Admitting: *Deleted

## 2019-04-20 DIAGNOSIS — K219 Gastro-esophageal reflux disease without esophagitis: Secondary | ICD-10-CM

## 2019-04-20 NOTE — Telephone Encounter (Signed)
Received refill requesting from pharmacy for pantoprazole 81m 1 po QD 30 min before breakfast #90 requesting 3 refills

## 2019-04-21 LAB — URINE CULTURE: Culture: >=100000 — AB

## 2019-04-21 MED ORDER — PANTOPRAZOLE SODIUM 40 MG PO TBEC: 40.0000 mg | DELAYED_RELEASE_TABLET | Freq: Two times a day (BID) | ORAL | 3 refills | Status: DC

## 2019-04-21 NOTE — Addendum Note (Signed)
Addended by: Gordy Levan, ERIC A on: 04/21/2019 09:16 AM   Modules accepted: Orders

## 2019-04-21 NOTE — Telephone Encounter (Signed)
Rx sent per request.

## 2019-04-25 ENCOUNTER — Other Ambulatory Visit: Payer: Self-pay | Admitting: Urology

## 2019-04-25 ENCOUNTER — Other Ambulatory Visit (HOSPITAL_COMMUNITY): Payer: Self-pay | Admitting: Urology

## 2019-04-25 DIAGNOSIS — N3000 Acute cystitis without hematuria: Secondary | ICD-10-CM

## 2019-04-26 ENCOUNTER — Other Ambulatory Visit: Payer: Self-pay

## 2019-04-26 ENCOUNTER — Encounter: Payer: Self-pay | Admitting: Gastroenterology

## 2019-04-26 ENCOUNTER — Ambulatory Visit (INDEPENDENT_AMBULATORY_CARE_PROVIDER_SITE_OTHER): Payer: Medicare HMO | Admitting: Gastroenterology

## 2019-04-26 VITALS — BP 99/63 | HR 75 | Temp 97.1°F | Ht 65.0 in | Wt 124.0 lb

## 2019-04-26 DIAGNOSIS — R112 Nausea with vomiting, unspecified: Secondary | ICD-10-CM

## 2019-04-26 DIAGNOSIS — K625 Hemorrhage of anus and rectum: Secondary | ICD-10-CM

## 2019-04-26 DIAGNOSIS — K529 Noninfective gastroenteritis and colitis, unspecified: Secondary | ICD-10-CM

## 2019-04-26 MED ORDER — PEG 3350-KCL-NA BICARB-NACL 420 G PO SOLR: 4000.0000 mL | ORAL | 0 refills | Status: DC

## 2019-04-26 NOTE — Progress Notes (Addendum)
REVIEWED-NO ADDITIONAL RECOMMENDATIONS.  Referring Provider: Glenda Chroman, MD Primary Care Physician:  Glenda Chroman, MD  Primary GI: Dr. Oneida Alar   Chief Complaint  Patient presents with  . Nausea    last episode few days ago. vomiting 5 days ago  . Abdominal Pain    occas with BM  . Gastroesophageal Reflux  . Rectal Bleeding    once episode 2 weeks ago    HPI:   Ashlee Martin is a 85 y.o. female presenting today with a history of chronic N/V, solid and liquid dysphagia. EGD/dilation arranged Aug 2019 with benign-appearing, intrinsic mild stenosis, s/p dilation. Mild inflammation entire stomach, s/p biopsy that was negative for H.pylori. Pylorus normal but had changes consistent with PRIOR ulcer disease. Duodenum normal. Dealing with diarrhea at least visit, which started when taking OFEV. 03/04/19: outside labs with macrocytic anemia.  Hgb 10.6. Last we have on file with Hgb 12.01 April 2018 (still macrocytic indices at that time). Last colonoscopy in 2012. Needs GES if persistent N/V.   Loose stool depending on whether eating or not. Average 4-5 per day, watery. Only time not water was when off OFEV. Was seeing persistent rectal bleeding and improved after using Anusol. No appetite. Lost 10 lbs since June 2020. Over 20 lbs since Jan 2020. Intermittent vomiting. Gets dry heaves at times. Takes Gas-x extra strength at least once a day, which does help. Protonix BID. Zofran BID, which helps with nausea. Decreased oral intake. Feels nauseated and knows if she pushes food down or forces, it will come back up. Take Imodium Mon, Wed, and Friday night.    Past Medical History:  Diagnosis Date  . Arthritis   . Depression   . GERD (gastroesophageal reflux disease)   . Hypercholesterolemia   . Hypertension   . ILD (interstitial lung disease) ([place])   . Psoriasis    had been on Humira but continued to have UTIs, sinus infections, etc. Taken off  . Seizure ([place])    had 3-4 seizures, 8 years  ago, placed on medication and has had no seizures since.  . Seizures ([place])     Past Surgical History:  Procedure Laterality Date  . BIOPSY  04/20/2018   Procedure: BIOPSY;  Surgeon: Danie Binder, MD;  Location: [facility];  Service: Endoscopy;;  gastric  . COLONOSCOPY  01/2011   Dr. Anthony Sar: normal.   . DILATION AND CURETTAGE OF UTERUS     x3  . ESOPHAGOGASTRODUODENOSCOPY (EGD) WITH PROPOFOL N/A 04/20/2018   benign-appearing, intrinsic mild stenosis, s/p dilation. Mild inflammation entire stomach, s/p biopsy that was negative for H.pylori. Pylorus normal but had changes consistent with PRIOR ulcer disease. Duodenum normal.   . SAVORY DILATION N/A 04/20/2018   Procedure: SAVORY DILATION;  Surgeon: Danie Binder, MD;  Location: [facility];  Service: Endoscopy;  Laterality: N/A;  . VIDEO BRONCHOSCOPY Bilateral 01/07/2018   Procedure: VIDEO BRONCHOSCOPY WITHOUT FLUORO;  Surgeon: Brand Males, MD;  Location: [facility];  Service: Cardiopulmonary;  Laterality: Bilateral;  . WRIST SURGERY Right    ORIF    Current Outpatient Medications  Medication Sig Dispense Refill  . atenolol (TENORMIN) 25 MG tablet Take 25 mg by mouth daily.     . Cranberry Fruit Concentrate 12600 MG CAPS Take by mouth. 4-5 times per week    . doxylamine, Sleep, (UNISOM) 25 MG tablet Take 25 mg by mouth at bedtime.     . hydrocortisone (ANUSOL-HC) 2.5 % rectal cream Place  1 application rectally 2 (two) times daily. (Patient taking differently: Place 1 application rectally as needed. ) 30 g 1  . lactobacillus acidophilus (BACID) TABS tablet Take 1 tablet by mouth daily.    Marland Kitchen loperamide (IMODIUM A-D) 2 MG tablet Take 2 mg by mouth. Mon, wed, friday    . Nintedanib (OFEV) 150 MG CAPS Take 150 mg by mouth 2 (two) times daily.    . ondansetron (ZOFRAN) 4 MG tablet Take 1 tablet (4 mg total) by mouth 2 (two) times daily as needed for nausea or vomiting. 30 tablet 1  . OVER THE COUNTER MEDICATION solonpas OTC as  needed    . pantoprazole (PROTONIX) 40 MG tablet Take 1 tablet (40 mg total) by mouth 2 (two) times daily before a meal. 90 tablet 3  . potassium chloride (K-DUR) 10 MEQ tablet Take 10 mEq by mouth every other day.    Marland Kitchen rOPINIRole (REQUIP) 2 MG tablet Take 2 mg by mouth at bedtime.     . Simethicone (GAS-X PO) Take by mouth as needed.    . simvastatin (ZOCOR) 20 MG tablet Take 20 mg by mouth at bedtime.     . sulfamethoxazole-trimethoprim (BACTRIM DS) 800-160 MG tablet Take 1 tablet by mouth 2 (two) times daily.    . vitamin B-12 (CYANOCOBALAMIN) 1000 MCG tablet Take 1,000 mcg by mouth as needed.     . hydrocortisone (ANUSOL-HC) 2.5 % rectal cream Place 1 application rectally 2 (two) times daily. (Patient not taking: Reported on 04/26/2019) 30 g 1  . polyethylene glycol-electrolytes (TRILYTE) 420 g solution Take 4,000 mLs by mouth as directed. 4000 mL 0   No current facility-administered medications for this visit.     Allergies as of 04/26/2019 - Review Complete 04/26/2019  Allergen Reaction Noted  . Humira [adalimumab] Other (See Comments) 11/03/2017  . Pirfenidone Other (See Comments) 04/27/2018    Family History  Problem Relation Age of Onset  . Heart disease Mother   . Heart disease Father   . Cancer Sister        GYN  . Colon cancer Neg Hx     Social History   Socioeconomic History  . Marital status: Married    Spouse name: Not on file  . Number of children: Not on file  . Years of education: Not on file  . Highest education level: Not on file  Occupational History  . Occupation: Retired  Scientific laboratory technician  . Financial resource strain: Not on file  . Food insecurity    Worry: Not on file    Inability: Not on file  . Transportation needs    Medical: Not on file    Non-medical: Not on file  Tobacco Use  . Smoking status: Former Smoker    Packs/day: 3.00    Years: 20.00    Pack years: 60.00    Types: Cigarettes    Quit date: 11/03/1972    Years since quitting: 46.5   . Smokeless tobacco: Never Used  Substance and Sexual Activity  . Alcohol use: Yes    Alcohol/week: 7.0 standard drinks    Types: 7 Shots of liquor per week    Comment: May have before bed  . Drug use: Never  . Sexual activity: Yes    Birth control/protection: None  Lifestyle  . Physical activity    Days per week: Not on file    Minutes per session: Not on file  . Stress: Not on file  Relationships  . Social connections  Talks on phone: Not on file    Gets together: Not on file    Attends religious service: Not on file    Active member of club or organization: Not on file    Attends meetings of clubs or organizations: Not on file    Relationship status: Not on file  Other Topics Concern  . Not on file  Social History Narrative  . Not on file    Review of Systems: Gen: Denies fever, chills, anorexia. Denies fatigue, weakness, weight loss.  CV: Denies chest pain, palpitations, syncope, peripheral edema, and claudication. Resp: Denies dyspnea at rest, cough, wheezing, coughing up blood, and pleurisy. GI: see HPI Derm: Denies rash, itching, dry skin Psych: Denies depression, anxiety, memory loss, confusion. No homicidal or suicidal ideation.  Heme: see HPI  Physical Exam: BP 99/63   Pulse 75   Temp (!) 97.1 F (36.2 C) (Oral)   Ht _0  (1.651 m)   Wt 124 lb (56.2 kg)   BMI 20.63 kg/m  General:   Alert and oriented. No distress noted. Pleasant and cooperative. 2 liters nasal cannula continuously Head:  Normocephalic and atraumatic. Eyes:  Conjuctiva clear without scleral icterus. Lungs:coarse bilaterally Cardiac: S1 S2 normal without murmur Abdomen:  +BS, soft, non-tender and non-distended. No rebound or guarding. No HSM or masses noted. Msk:  With kyphosis  Extremities:  Without edema. Neurologic:  Alert and  oriented x4 Psych:  Alert and cooperative. Normal mood and affect.

## 2019-04-26 NOTE — Patient Instructions (Addendum)
We are arranging a gastric emptying study. Continue with Protonix twice a day, 30 minutes before meals.  You may take Imodium each morning.   We are arranging a colonoscopy in the near future.   Further recommendations to follow!  I enjoyed seeing you again today! As you know, I value our relationship and want to provide genuine, compassionate, and quality care. I welcome your feedback. If you receive a survey regarding your visit,  I greatly appreciate you taking time to fill this out. See you next time!  Annitta Needs, PhD, ANP-BC [facility]

## 2019-04-27 ENCOUNTER — Telehealth: Payer: Self-pay

## 2019-04-27 NOTE — Telephone Encounter (Signed)
GES scheduled for 05/03/19 at 8:00am, arrive at 7:45am. NPO and no stomach meds after midnight prior to test. Tried to call pt, line busy.

## 2019-04-27 NOTE — Telephone Encounter (Signed)
Called pt, informed her of GES appt. Letter mailed.

## 2019-04-29 NOTE — Assessment & Plan Note (Signed)
Colonoscopy planned.

## 2019-04-29 NOTE — Assessment & Plan Note (Signed)
85 year old female with chronic diarrhea since starting OFEV (pulmonary fibrosis), and improving when taking a drug holiday. Requires this medication long-term. Associated rectal bleeding likely related to benign anorectal source and resolved with Anusol; however, weight loss documented in epic with 10 lost since June 2020 and over 20 lbs since Jan 2020. Last colonoscopy in 2012. Discussed updating colonoscopy due to persistent diarrhea to exclude underlying process (although likely med effect), rectal bleeding, weight loss.  Proceed with colonoscopy with Dr. Oneida Alar in the near future. The risks, benefits, and alternatives have been discussed in detail with the patient. They state understanding and desire to proceed.  Propofol due to polypharmacy Increase Imodium to each morning: may need to titrate

## 2019-04-29 NOTE — Assessment & Plan Note (Addendum)
EGD Aug 2019 with benign-appearing, intrinsic mild stenosis, s/p dilation. Mild inflammation entire stomach, s/p biopsy that was negative for H.pylori. Pylorus normal but had changes consistent with PRIOR ulcer disease. Duodenum normal. Remains on Protonix BID. Persistent nausea and intermittent vomiting. Pursue GES as previously recommended.   ADDENDUM: GES normal. I received a CT chest/abd/pelvis WITHOUT IV contrast in [place] that was ordered by Darrin Nipper, NP, dated 05/18/2019. This showed marked thickening of the fundus of stomach at the GE junction, unable to exclude gastric mass. Stable chronic interstitial lung disease. Reviewed this with Dr. Thornton Papas, who agrees there is definite abnormality. Also notes thickening of duodenum.   EGD with Propofol has been added to colonoscopy. Patient aware.

## 2019-05-03 ENCOUNTER — Other Ambulatory Visit: Payer: Self-pay

## 2019-05-03 ENCOUNTER — Encounter (HOSPITAL_COMMUNITY)
Admission: RE | Admit: 2019-05-03 | Discharge: 2019-05-03 | Disposition: A | Discharge status: Home / Self Care | Payer: Medicare HMO | Source: Ambulatory Visit | Attending: Gastroenterology | Admitting: Gastroenterology

## 2019-05-03 DIAGNOSIS — R112 Nausea with vomiting, unspecified: Secondary | ICD-10-CM | POA: Diagnosis not present

## 2019-05-03 DIAGNOSIS — R110 Nausea: Secondary | ICD-10-CM | POA: Diagnosis not present

## 2019-05-03 DIAGNOSIS — R109 Unspecified abdominal pain: Secondary | ICD-10-CM | POA: Diagnosis not present

## 2019-05-03 MED ORDER — TECHNETIUM TC 99M SULFUR COLLOID
2.0000 | Freq: Once | INTRAVENOUS | Status: AC | PRN
  Administered 2019-05-03: 09:00:00 2.14 via INTRAVENOUS

## 2019-05-05 ENCOUNTER — Telehealth: Payer: Self-pay | Admitting: Gastroenterology

## 2019-05-05 ENCOUNTER — Other Ambulatory Visit: Payer: Self-pay | Admitting: *Deleted

## 2019-05-05 DIAGNOSIS — R6889 Other general symptoms and signs: Secondary | ICD-10-CM | POA: Diagnosis not present

## 2019-05-05 DIAGNOSIS — Z20822 Contact with and (suspected) exposure to covid-19: Secondary | ICD-10-CM

## 2019-05-05 NOTE — Telephone Encounter (Signed)
PT was calling about the GES and I told her it was normal. Forwarding to [name], NP to advise.

## 2019-05-05 NOTE — Telephone Encounter (Signed)
PATIENT CALLED INQUIRING ABOUT RESULTS FROM HER X RAYS

## 2019-05-06 LAB — NOVEL CORONAVIRUS, NAA: SARS-CoV-2, NAA: NOT DETECTED

## 2019-05-06 NOTE — Telephone Encounter (Signed)
Correct. It was normal. Please see result note for further info.

## 2019-05-08 DIAGNOSIS — R0902 Hypoxemia: Secondary | ICD-10-CM | POA: Diagnosis not present

## 2019-05-08 DIAGNOSIS — J849 Interstitial pulmonary disease, unspecified: Secondary | ICD-10-CM | POA: Diagnosis not present

## 2019-05-08 DIAGNOSIS — Z96649 Presence of unspecified artificial hip joint: Secondary | ICD-10-CM | POA: Diagnosis not present

## 2019-05-08 DIAGNOSIS — M1611 Unilateral primary osteoarthritis, right hip: Secondary | ICD-10-CM | POA: Diagnosis not present

## 2019-05-09 NOTE — Telephone Encounter (Signed)
I did not get a result note on this.

## 2019-05-10 ENCOUNTER — Other Ambulatory Visit: Payer: Self-pay

## 2019-05-10 DIAGNOSIS — R112 Nausea with vomiting, unspecified: Secondary | ICD-10-CM

## 2019-05-10 DIAGNOSIS — N183 Chronic kidney disease, stage 3 (moderate): Secondary | ICD-10-CM | POA: Diagnosis not present

## 2019-05-10 DIAGNOSIS — Z299 Encounter for prophylactic measures, unspecified: Secondary | ICD-10-CM | POA: Diagnosis not present

## 2019-05-10 DIAGNOSIS — K529 Noninfective gastroenteritis and colitis, unspecified: Secondary | ICD-10-CM

## 2019-05-10 DIAGNOSIS — R634 Abnormal weight loss: Secondary | ICD-10-CM | POA: Diagnosis not present

## 2019-05-10 DIAGNOSIS — Z681 Body mass index (BMI) 19 or less, adult: Secondary | ICD-10-CM | POA: Diagnosis not present

## 2019-05-10 DIAGNOSIS — K625 Hemorrhage of anus and rectum: Secondary | ICD-10-CM

## 2019-05-10 DIAGNOSIS — R197 Diarrhea, unspecified: Secondary | ICD-10-CM | POA: Diagnosis not present

## 2019-05-10 DIAGNOSIS — R630 Anorexia: Secondary | ICD-10-CM | POA: Diagnosis not present

## 2019-05-10 DIAGNOSIS — I10 Essential (primary) hypertension: Secondary | ICD-10-CM | POA: Diagnosis not present

## 2019-05-10 NOTE — Progress Notes (Signed)
Ashlee Martin: GES is normal. Would she be willing to pursue EGD at time of colonoscopy? I know this would be adding on to the colonoscopy and ideally would have scheduled at time of visit, but her symptoms are concerning.

## 2019-05-10 NOTE — Telephone Encounter (Signed)
Thanks for Result note. Pt is aware.

## 2019-05-11 ENCOUNTER — Ambulatory Visit (HOSPITAL_COMMUNITY): Payer: Medicare HMO

## 2019-05-11 ENCOUNTER — Encounter (HOSPITAL_COMMUNITY): Payer: Self-pay

## 2019-05-12 ENCOUNTER — Telehealth: Payer: Self-pay | Admitting: *Deleted

## 2019-05-12 NOTE — Telephone Encounter (Signed)
Notes recorded by Hassan Rowan, LPN on 1/94/7125 at 2:71 PM EDT  PA for EGD submitted via [employer] website. Case went to clinical review. [facility] Tracking Number: [id number]. Clinical notes faxed to [facility]

## 2019-05-12 NOTE — Telephone Encounter (Signed)
Received a call from [facility], Judson Roch. She was unable to approved EGD at nurse level. This will be sent to physician for review.

## 2019-05-13 ENCOUNTER — Telehealth: Payer: Self-pay | Admitting: Internal Medicine

## 2019-05-13 NOTE — Telephone Encounter (Signed)
Primary Pulmonologist: ramaswamy Last office visit and with whom: 02/28/19 BM What do we see them for (pulmonary problems): ILD Last OV assessment/plan: Assessment and Plan:  ILD (interstitial lung disease) ([place]) Assessment: Known IPF Previously failed Esbriet Multiple drug holidays from [place] due to diarrhea in March/April/May 2020 Imodium Monday Wednesday Friday Has been maintained on Ofev twice daily since May/2020  Plan: Continue OFEV as prescribed Continue Imodium Monday Wednesday Friday 32-monthfollow-up with Dr. RChase CallerWe will coordinate with primary care to ensure patient gets hepatic function panel drawn at physical  Was appointment offered to patient (explain)?     Reason for call:    Patient having diarrhea , ofev pill in stool.  Vomiting started 1 week ago.   TP please advise

## 2019-05-13 NOTE — Telephone Encounter (Signed)
Please hold OFEV for now . Make office visit in next couple of weeks to discuss options for Fibrosis .  Has ongoing GI issues , has upcoming colonoscopy . Seen earlier this month by GI Follow up with GI .  Fluids rest, advance clear liquid diet , if not improving or unable to hold down fluid needs to seek medical care  Please contact office for sooner follow up if symptoms do not improve or worsen or seek emergency care   .

## 2019-05-13 NOTE — Telephone Encounter (Signed)
Called spoke to patient Let her know TPs recs.  Patient voiced understanding.  Appt made with Wyn Quaker for 05/30/19 at 1100.  Patient intrusted to go to ED if not feeling better or able to hold down fluids and to follow up with GI. Patient voiced understanding.  Nothing further needed at this time.

## 2019-05-16 ENCOUNTER — Telehealth: Payer: Self-pay | Admitting: Gastroenterology

## 2019-05-16 ENCOUNTER — Ambulatory Visit (HOSPITAL_COMMUNITY)
Admission: RE | Admit: 2019-05-16 | Discharge: 2019-05-16 | Disposition: A | Discharge status: Home / Self Care | Payer: Medicare HMO | Source: Ambulatory Visit | Attending: Urology | Admitting: Urology

## 2019-05-16 ENCOUNTER — Other Ambulatory Visit: Payer: Self-pay

## 2019-05-16 DIAGNOSIS — Z87891 Personal history of nicotine dependence: Secondary | ICD-10-CM | POA: Insufficient documentation

## 2019-05-16 DIAGNOSIS — Z79899 Other long term (current) drug therapy: Secondary | ICD-10-CM | POA: Insufficient documentation

## 2019-05-16 DIAGNOSIS — K219 Gastro-esophageal reflux disease without esophagitis: Secondary | ICD-10-CM | POA: Insufficient documentation

## 2019-05-16 DIAGNOSIS — J849 Interstitial pulmonary disease, unspecified: Secondary | ICD-10-CM | POA: Insufficient documentation

## 2019-05-16 DIAGNOSIS — I10 Essential (primary) hypertension: Secondary | ICD-10-CM | POA: Diagnosis not present

## 2019-05-16 DIAGNOSIS — N3000 Acute cystitis without hematuria: Secondary | ICD-10-CM | POA: Insufficient documentation

## 2019-05-16 NOTE — Telephone Encounter (Signed)
Pt said her PCP wanted her to move up her colonoscopy/EGD to sooner than 07/11/2019. [phone]

## 2019-05-16 NOTE — Telephone Encounter (Signed)
Called patient. And made aware [facility] is booked up and nothing sooner. Nothing further needed

## 2019-05-17 NOTE — Telephone Encounter (Signed)
Received fax from [facility], EGD approved. PA# [id number], valid 07/11/19-08/10/19.

## 2019-05-18 DIAGNOSIS — R918 Other nonspecific abnormal finding of lung field: Secondary | ICD-10-CM | POA: Diagnosis not present

## 2019-05-18 DIAGNOSIS — R634 Abnormal weight loss: Secondary | ICD-10-CM | POA: Diagnosis not present

## 2019-05-18 DIAGNOSIS — R748 Abnormal levels of other serum enzymes: Secondary | ICD-10-CM | POA: Diagnosis not present

## 2019-05-18 DIAGNOSIS — J8401 Alveolar proteinosis: Secondary | ICD-10-CM | POA: Diagnosis not present

## 2019-05-20 ENCOUNTER — Telehealth: Payer: Self-pay | Admitting: Internal Medicine

## 2019-05-20 NOTE — Telephone Encounter (Signed)
Seeing Ashlee Martin on 05/30/2019 but I got a noted dated 05/16/2019 from [facility] that she is having GI side effects. Please see if she can be fit into my schedule 30 min slot this week of 05/23/2019 . If not an APP who is in clinic same time and side as me so I can coordinate care  Thanks    SIGNATURE    Dr. Brand Males, M.D., F.C.C.P,  Pulmonary and Critical Care Medicine Staff Physician, [place] Director - Interstitial Lung Disease  Program  Pulmonary [place] at [place], [place], [address]  Pager: [phone], If no answer or between  15:00h - 7:00h: call [phone] Telephone: [phone]  6:36 PM 05/20/2019

## 2019-05-23 ENCOUNTER — Telehealth: Payer: Self-pay | Admitting: Pulmonary Disease

## 2019-05-23 NOTE — Telephone Encounter (Signed)
See previous message.

## 2019-05-23 NOTE — Telephone Encounter (Signed)
Pt calling back from earlier phone call but has another appt.  [phone].

## 2019-05-23 NOTE — Telephone Encounter (Signed)
Left message for patient to call back.

## 2019-05-24 ENCOUNTER — Other Ambulatory Visit: Payer: Self-pay | Admitting: *Deleted

## 2019-05-24 ENCOUNTER — Telehealth: Payer: Self-pay | Admitting: Gastroenterology

## 2019-05-24 NOTE — Telephone Encounter (Signed)
Called [facility] and auth for EGD has been updated. 05/31/2019-06/30/2019

## 2019-05-24 NOTE — Telephone Encounter (Signed)
Called patient. [facility] had cancellation on 10/6 at 10:30am for procedure. She was agreeable to take this appt. Called endo and aware of date change. She is scheduled for pre-op/covid-19 testing 10/2 fri at 10:30am.   Patient aware of where to go for pre-op/covid-19 testing. She will stop by the office on Friday before pre-op to pick up her prep instructions as she has already received prep from the pharmacy. Instructions printed off.

## 2019-05-24 NOTE — Telephone Encounter (Signed)
I received a CT chest/abd/pelvis WITHOUT IV contrast in [place] that was ordered by Darrin Nipper, NP. This showed marked thickening of the fundus of stomach at the GE junction, unable to exclude gastric mass. Stable chronic interstitial lung disease.  Her colonoscopy/EGD is in November. We need to move this up.  I will review imaging with Dr. Thornton Papas, radiologist. Last EGD in Aug 2019.

## 2019-05-27 ENCOUNTER — Other Ambulatory Visit: Payer: Self-pay

## 2019-05-27 ENCOUNTER — Encounter (HOSPITAL_COMMUNITY)
Admission: RE | Admit: 2019-05-27 | Discharge: 2019-05-27 | Disposition: A | Discharge status: Home / Self Care | Payer: Medicare HMO | Source: Ambulatory Visit | Attending: Gastroenterology | Admitting: Gastroenterology

## 2019-05-27 ENCOUNTER — Other Ambulatory Visit (HOSPITAL_COMMUNITY)
Admission: RE | Admit: 2019-05-27 | Discharge: 2019-05-27 | Disposition: A | Discharge status: Home / Self Care | Payer: Medicare HMO | Source: Ambulatory Visit | Attending: Gastroenterology | Admitting: Gastroenterology

## 2019-05-27 DIAGNOSIS — Z01812 Encounter for preprocedural laboratory examination: Secondary | ICD-10-CM | POA: Diagnosis not present

## 2019-05-27 DIAGNOSIS — Z20828 Contact with and (suspected) exposure to other viral communicable diseases: Secondary | ICD-10-CM | POA: Diagnosis not present

## 2019-05-27 LAB — BASIC METABOLIC PANEL
Anion gap: 9 (ref 5–15)
BUN: 12 mg/dL (ref 8–23)
CO2: 26 mmol/L (ref 22–32)
Calcium: 9.1 mg/dL (ref 8.9–10.3)
Chloride: 100 mmol/L (ref 98–111)
Creatinine, Ser: 1.03 mg/dL — ABNORMAL HIGH (ref 0.44–1.00)
GFR calc Af Amer: 57 mL/min — ABNORMAL LOW (ref >60)
GFR calc non Af Amer: 50 mL/min — ABNORMAL LOW (ref >60)
Glucose, Bld: 93 mg/dL (ref 70–99)
Potassium: 4.5 mmol/L (ref 3.5–5.1)
Sodium: 135 mmol/L (ref 135–145)

## 2019-05-27 LAB — CBC
HCT: 35.2 % — ABNORMAL LOW (ref 36.0–46.0)
Hemoglobin: 10.9 g/dL — ABNORMAL LOW (ref 12.0–15.0)
MCH: 35.9 pg — ABNORMAL HIGH (ref 26.0–34.0)
MCHC: 31 g/dL (ref 30.0–36.0)
MCV: 115.8 fL — ABNORMAL HIGH (ref 80.0–100.0)
Platelets: 276 10*3/uL (ref 150–400)
RBC: 3.04 MIL/uL — ABNORMAL LOW (ref 3.87–5.11)
RDW: 13.5 % (ref 11.5–15.5)
WBC: 6.1 10*3/uL (ref 4.0–10.5)
nRBC: 0 % (ref 0.0–0.2)

## 2019-05-27 NOTE — Patient Instructions (Signed)
Ashlee Martin  05/27/2019     _0 @   Your procedure is scheduled on 05/31/19  Report to [facility] at [place]M.  Call this number if you have problems the morning of surgery:  [phone]   Remember:  Follow instructions given to you by Dr. Oneida Alar' office.  Call them with any questions you may have [phone]     Take these medicines the morning of surgery with A SIP OF WATER Atenolol and Requip    Do not wear jewelry, make-up or nail polish.  Do not wear lotions, powders, or perfumes, or deodorant.  Do not shave 48 hours prior to surgery.  Men may shave face and neck.  Do not bring valuables to the hospital.  [facility] is not responsible for any belongings or valuables.  Contacts, dentures or bridgework may not be worn into surgery.  Leave your suitcase in the car.  After surgery it may be brought to your room.  For patients admitted to the hospital, discharge time will be determined by your treatment team.  Patients discharged the day of surgery will not be allowed to drive home.    Please read over the following fact sheets that you were given. Anesthesia Post-op Instructions     PATIENT INSTRUCTIONS POST-ANESTHESIA  IMMEDIATELY FOLLOWING SURGERY:  Do not drive or operate machinery for the first twenty four hours after surgery.  Do not make any important decisions for twenty four hours after surgery or while taking narcotic pain medications or sedatives.  If you develop intractable nausea and vomiting or a severe headache please notify your doctor immediately.  FOLLOW-UP:  Please make an appointment with your surgeon as instructed. You do not need to follow up with anesthesia unless specifically instructed to do so.  WOUND CARE INSTRUCTIONS (if applicable):  Keep a dry clean dressing on the anesthesia/puncture wound site if there is drainage.  Once the wound has quit draining you may leave it open to air.  Generally you should leave the bandage intact  for twenty four hours unless there is drainage.  If the epidural site drains for more than 36-48 hours please call the anesthesia department.  QUESTIONS?:  Please feel free to call your physician or the hospital operator if you have any questions, and they will be happy to assist you.      Colonoscopy, Adult A colonoscopy is an exam to look at the entire large intestine. During the exam, a lubricated, flexible tube that has a camera on the end of it is inserted into the anus and then passed into the rectum, colon, and other parts of the large intestine. You may have a colonoscopy as a part of normal colorectal screening or if you have certain symptoms, such as:  Lack of red blood cells (anemia).  Diarrhea that does not go away.  Abdominal pain.  Blood in your stool (feces). A colonoscopy can help screen for and diagnose medical problems, including:  Tumors.  Polyps.  Inflammation.  Areas of bleeding. Tell a health care provider about:  Any allergies you have.  All medicines you are taking, including vitamins, herbs, eye drops, creams, and over-the-counter medicines.  Any problems you or family members have had with anesthetic medicines.  Any blood disorders you have.  Any surgeries you have had.  Any medical conditions you have.  Any problems you have had passing stool. What are the risks? Generally, this is a safe procedure. However, problems may occur, including:  Bleeding.  A tear in the intestine.  A reaction to medicines given during the exam.  Infection (rare). What happens before the procedure? Eating and drinking restrictions Follow instructions from your health care provider about eating and drinking, which may include:  A few days before the procedure - follow a low-fiber diet. Avoid nuts, seeds, dried fruit, raw fruits, and vegetables.  1-3 days before the procedure - follow a clear liquid diet. Drink only clear liquids, such as clear broth or  bouillon, black coffee or tea, clear juice, clear soft drinks or sports drinks, gelatin dessert, and popsicles. Avoid any liquids that contain red or purple dye.  On the day of the procedure - do not eat or drink anything starting 2 hours before the procedure, or within the time period that your health care provider recommends. Up to 2 hours before the procedure, you may continue to drink clear liquids, such as water or clear fruit juice. Bowel prep If you were prescribed an oral bowel prep to clean out your colon:  Take it as told by your health care provider. Starting the day before your procedure, you will need to drink a large amount of medicated liquid. The liquid will cause you to have multiple loose stools until your stool is almost clear or light green.  If your skin or anus gets irritated from diarrhea, you may use these to relieve the irritation: ? Medicated wipes, such as adult wet wipes with aloe and vitamin E. ? A skin-soothing product like petroleum jelly.  If you vomit while drinking the bowel prep, take a break for up to 60 minutes and then begin the bowel prep again. If vomiting continues and you cannot take the bowel prep without vomiting, call your health care provider.  To clean out your colon, you may also be given: ? Laxative medicines. ? Instructions about how to use an enema. General instructions  Ask your health care provider about: ? Changing or stopping your regular medicines or supplements. This is especially important if you are taking iron supplements, diabetes medicines, or blood thinners. ? Taking medicines such as aspirin and ibuprofen. These medicines can thin your blood. Do not take these medicines before the procedure if your health care provider tells you not to.  Plan to have someone take you home from the hospital or clinic. What happens during the procedure?   An IV may be inserted into one of your veins.  You will be given medicine to help you  relax (sedative).  To reduce your risk of infection: ? Your health care team will wash or sanitize their hands. ? Your anal area will be washed with soap.  You will be asked to lie on your side with your knees bent.  Your health care provider will lubricate a long, thin, flexible tube. The tube will have a camera and a light on the end.  The tube will be inserted into your anus.  The tube will be gently eased through your rectum and colon.  Air will be delivered into your colon to keep it open. You may feel some pressure or cramping.  The camera will be used to take images during the procedure.  A small tissue sample may be removed to be examined under a microscope (biopsy).  If small polyps are found, your health care provider may remove them and have them checked for cancer cells.  When the exam is done, the tube will be removed. The procedure may vary among health care providers  and hospitals. What happens after the procedure?  Your blood pressure, heart rate, breathing rate, and blood oxygen level will be monitored until the medicines you were given have worn off.  Do not drive for 24 hours after the exam.  You may have a small amount of blood in your stool.  You may pass gas and have mild abdominal cramping or bloating due to the air that was used to inflate your colon during the exam.  It is up to you to get the results of your procedure. Ask your health care provider, or the department performing the procedure, when your results will be ready. Summary  A colonoscopy is an exam to look at the entire large intestine.  During a colonoscopy, a lubricated, flexible tube with a camera on the end of it is inserted into the anus and then passed into the colon and other parts of the large intestine.  Follow instructions from your health care provider about eating and drinking before the procedure.  If you were prescribed an oral bowel prep to clean out your colon, take it as  told by your health care provider.  After your procedure, your blood pressure, heart rate, breathing rate, and blood oxygen level will be monitored until the medicines you were given have worn off. This information is not intended to replace advice given to you by your health care provider. Make sure you discuss any questions you have with your health care provider. Document Released: 08/08/2000 Document Revised: 06/03/2017 Document Reviewed: 10/23/2015 Elsevier Patient Education  2020 [place] End[facility], Adult [place] endoscopy is a procedure to look inside the [place] GI (gastrointestinal) tract. The [place] GI tract is made up of:  The part of the body that moves food from your mouth to your stomach (esophagus).  The stomach.  The first part of your small intestine (duodenum). This procedure is also called esophagogastroduodenoscopy (EGD) or gastroscopy. In this procedure, your health care provider passes a thin, flexible tube (endoscope) through your mouth and down your esophagus into your stomach. A small camera is attached to the end of the tube. Images from the camera appear on a monitor in the exam room. During this procedure, your health care provider may also remove a small piece of tissue to be sent to a lab and examined under a microscope (biopsy). Your health care provider may do an [place] endoscopy to diagnose cancers of the [place] GI tract. You may also have this procedure to find the cause of other conditions, such as:  Stomach pain.  Heartburn.  Pain or problems when swallowing.  Nausea and vomiting.  Stomach bleeding.  Stomach ulcers. Tell a health care provider about:  Any allergies you have.  All medicines you are taking, including vitamins, herbs, eye drops, creams, and over-the-counter medicines.  Any problems you or family members have had with anesthetic medicines.  Any blood disorders you have.  Any surgeries you have had.  Any medical conditions  you have.  Whether you are pregnant or may be pregnant. What are the risks? Generally, this is a safe procedure. However, problems may occur, including:  Infection.  Bleeding.  Allergic reactions to medicines.  A tear or hole (perforation) in the esophagus, stomach, or duodenum. What happens before the procedure? Staying hydrated Follow instructions from your health care provider about hydration, which may include:  Up to 2 hours before the procedure - you may continue to drink clear liquids, such as water, clear fruit juice, black coffee,  and plain tea.  Eating and drinking restrictions Follow instructions from your health care provider about eating and drinking, which may include:  8 hours before the procedure - stop eating heavy meals or foods, such as meat, fried foods, or fatty foods.  6 hours before the procedure - stop eating light meals or foods, such as toast or cereal.  6 hours before the procedure - stop drinking milk or drinks that contain milk.  2 hours before the procedure - stop drinking clear liquids. Medicines Ask your health care provider about:  Changing or stopping your regular medicines. This is especially important if you are taking diabetes medicines or blood thinners.  Taking medicines such as aspirin and ibuprofen. These medicines can thin your blood. Do not take these medicines unless your health care provider tells you to take them.  Taking over-the-counter medicines, vitamins, herbs, and supplements. General instructions  Plan to have someone take you home from the hospital or clinic.  If you will be going home right after the procedure, plan to have someone with you for 24 hours.  Ask your health care provider what steps will be taken to help prevent infection. What happens during the procedure?   An IV will be inserted into one of your veins.  You may be given one or more of the following: ? A medicine to help you relax (sedative). ? A  medicine to numb the throat (local anesthetic).  You will lie on your left side on an exam table.  Your health care provider will pass the endoscope through your mouth and down your esophagus.  Your health care provider will use the scope to check the inside of your esophagus, stomach, and duodenum. Biopsies may be taken.  The endoscope will be removed. The procedure may vary among health care providers and hospitals. What happens after the procedure?  Your blood pressure, heart rate, breathing rate, and blood oxygen level will be monitored until you leave the hospital or clinic.  Do not drive for 24 hours if you were given a sedative during your procedure.  When your throat is no longer numb, you may be given some fluids to drink.  It is up to you to get the results of your procedure. Ask your health care provider, or the department that is doing the procedure, when your results will be ready. Summary  [place] endoscopy is a procedure to look inside the [place] GI tract.  During the procedure, an IV will be inserted into one of your veins. You may be given a medicine to help you relax.  A medicine will be used to numb your throat.  The endoscope will be passed through your mouth and down your esophagus. This information is not intended to replace advice given to you by your health care provider. Make sure you discuss any questions you have with your health care provider. Document Released: 08/08/2000 Document Revised: 02/03/2018 Document Reviewed: 01/11/2018 Elsevier Patient Education  2020 [employer].

## 2019-05-28 ENCOUNTER — Other Ambulatory Visit: Payer: Self-pay | Admitting: Internal Medicine

## 2019-05-28 DIAGNOSIS — L400 Psoriasis vulgaris: Secondary | ICD-10-CM

## 2019-05-28 DIAGNOSIS — I700 Atherosclerosis of aorta: Secondary | ICD-10-CM

## 2019-05-30 ENCOUNTER — Other Ambulatory Visit: Payer: Self-pay

## 2019-05-30 ENCOUNTER — Encounter: Payer: Self-pay | Admitting: Pulmonary Disease

## 2019-05-30 ENCOUNTER — Ambulatory Visit (INDEPENDENT_AMBULATORY_CARE_PROVIDER_SITE_OTHER): Payer: Medicare HMO | Admitting: Pulmonary Disease

## 2019-05-30 DIAGNOSIS — J84112 Idiopathic pulmonary fibrosis: Secondary | ICD-10-CM

## 2019-05-30 DIAGNOSIS — K521 Toxic gastroenteritis and colitis: Secondary | ICD-10-CM | POA: Diagnosis not present

## 2019-05-30 NOTE — Progress Notes (Signed)
_0  ID: Ashlee Martin, female    DOB: Feb 21, 1931, 85 y.o.   MRN: [id number]  Chief Complaint  Patient presents with  . Follow-up    For IPF. States her breathing has been ok since she stopped the ofev. Her stomach is slowing recovering.     Referring provider: Glenda Chroman, MD  HPI:  85 year old female former smoker followed in our office for idiopathic pulmonary fibrosis (failed Esbriet, currently on 0FEV)  PMH: Nausea vomiting, GERD, UTI, chronic diarrhea Smoker/ Smoking History: Former smoker. Quit 1974. 60-pack-year smoking history Maintenance: OFEV Pt of: Dr. Chase Caller   05/30/2019  - Visit   85 year old female former smoker followed in our office for IPF.  Patient previously been managed on Esbriet but failed this therapy and was switched to [facility] reporting that the patient was having increased diarrhea as well as visually saw OFEV pill in stool.  She also had some vomiting.  This started the beginning of September.  She was instructed by triage NP at that time to hold OFEV until GI eval.  She also had an upcoming appointment with gastroenterology she is planned to have a colonoscopy tomorrow (05/31/2019).    Patient reports that she feels significantly better since stopping anti-fibrotic therapy.  She has had issues with anti-fibrotic's in the past and is already failed Esbriet.  Patient reports that her appetite is improved, her fatigue is improved, she feels that she is breathing better, feels that overall her quality life is improved since stopping anti-fibrotic's.  She also reports that she is had weight gain.  She still does have aspects of diarrhea but overall feels improved since stopping 0FEV in September/2020.   Tests:   12/24/2018-SARS-CoV-2-negative  06/15/18 -CT chest high-res- previously noted left Cue lobe nodules completely resolved, chronic changes of interstitial lung disease again considered probable UIP  FENO:  No results found for: NITRICOXIDE   PFT: PFT Results Latest Ref Rng & Units 06/16/2018 12/29/2017  FVC-Pre L 1.54 1.49  FVC-Predicted Pre % 60 58  Pre FEV1/FVC % % 83 79  FEV1-Pre L 1.27 1.18  FEV1-Predicted Pre % 66 62  DLCO UNC% % 23 30  DLCO COR %Predicted % 40 53    WALK:  SIX MIN WALK 05/26/2018 04/27/2018 03/15/2018 03/15/2018 02/02/2018 11/03/2017  Supplimental Oxygen during Test? (L/min) Yes No Yes No No No  O2 Flow Rate 2 - 2 - - -  Type Continuous - Continuous - - -  Tech Comments: patient completed all 3 laps with no issues  Had pt stop when dropped to 87% and pt slowly came back up to 92%. Pt's O2 dropped again to 87% once back at the room. Pt denies O2 at this time Pt maintained stable sats on 2L O2. Pt had complaints of mild SOB while walking. Pt was placed on 2L O2 Pt walked at a normal pace completing all required laps. Had complaints of moderate SOB. Pt walked at a slow pace completing all required laps. Pt had mild SOB during walk.    Imaging: Nm Gastric Emptying  Result Date: 05/03/2019 CLINICAL DATA:  Abdominal pain and nausea for few months EXAM: NUCLEAR MEDICINE GASTRIC EMPTYING SCAN TECHNIQUE: After oral ingestion of radiolabeled meal, sequential abdominal images were obtained for 4 hours. Percentage of activity emptying the stomach was calculated at 1 hour, 2 hour, 3 hour, and 4 hours. RADIOPHARMACEUTICALS:  2.14 mCi Tc-58msulfur colloid in standardized meal COMPARISON:  None FINDINGS: Expected location of the stomach in the left  Tischer quadrant. Ingested meal empties the stomach gradually over the course of the study. 31% emptied at 1 hr ( normal >= 10%) 57% emptied at 2 hr ( normal >= 40%) 92% emptied at 3 hr ( normal >= 70%) N/A emptied at 4 hr ( normal >= 90%) ** imaging at 4 hours was not required ** IMPRESSION: Normal gastric emptying study. Electronically Signed   By: Lavonia Dana M.D.   On: 05/03/2019 14:18   US Renal  Result Date: 05/16/2019 CLINICAL DATA:  Acute cystitis EXAM: RENAL / URINARY TRACT  ULTRASOUND COMPLETE COMPARISON:  Ultrasound 04/22/2018 FINDINGS: Right Kidney: Renal measurements: 10.4 x 4.7 x 4.2 cm = volume: 108 mL . Echogenicity within normal limits. No mass or hydronephrosis visualized. Left Kidney: Renal measurements: 10.5 x 5.3 x 5.4 cm = volume: 156 mL. Echogenicity within normal limits. No mass or hydronephrosis visualized. Bladder: Appears normal for degree of bladder distention. IMPRESSION: No acute findings.  No hydronephrosis. Electronically Signed   By: Rolm Baptise M.D.   On: 05/16/2019 16:24    Lab Results:  CBC    Component Value Date/Time   WBC 6.1 05/27/2019 1004   RBC 3.04 (L) 05/27/2019 1004   HGB 10.9 (L) 05/27/2019 1004   HCT 35.2 (L) 05/27/2019 1004   PLT 276 05/27/2019 1004   MCV 115.8 (H) 05/27/2019 1004   MCH 35.9 (H) 05/27/2019 1004   MCHC 31.0 05/27/2019 1004   RDW 13.5 05/27/2019 1004   LYMPHSABS 2.2 04/27/2018 1506   MONOABS 1.2 (H) 04/27/2018 1506   EOSABS 0.5 04/27/2018 1506   BASOSABS 0.1 04/27/2018 1506    BMET    Component Value Date/Time   NA 135 05/27/2019 1004   K 4.5 05/27/2019 1004   CL 100 05/27/2019 1004   CO2 26 05/27/2019 1004   GLUCOSE 93 05/27/2019 1004   BUN 12 05/27/2019 1004   CREATININE 1.03 (H) 05/27/2019 1004   CALCIUM 9.1 05/27/2019 1004   GFRNONAA 50 (L) 05/27/2019 1004   GFRAA 57 (L) 05/27/2019 1004    BNP No results found for: BNP  ProBNP No results found for: PROBNP  Specialty Problems      Pulmonary Problems   Allergic rhinitis   IPF (idiopathic pulmonary fibrosis) (HCC)   Acute respiratory failure with hypoxia (HCC)      Allergies  Allergen Reactions  . Humira [Adalimumab] Other (See Comments)    Worsened your psoriasis.  . Pirfenidone Other (See Comments)    Fatigue and weight loss    Immunization History  Administered Date(s) Administered  . Influenza, High Dose Seasonal PF 06/10/2017, 04/27/2018  . Pneumococcal Conjugate-13 12/09/2014, 09/05/2018   [facility] - flu vaccine   We will request records   Past Medical History:  Diagnosis Date  . Arthritis   . Depression   . GERD (gastroesophageal reflux disease)   . Hypercholesterolemia   . Hypertension   . ILD (interstitial lung disease) ([place])   . Psoriasis    had been on Humira but continued to have UTIs, sinus infections, etc. Taken off  . Seizure ([place])    had 3-4 seizures, 8 years ago, placed on medication and has had no seizures since.  . Seizures ([place])     Tobacco History: Social History   Tobacco Use  Smoking Status Former Smoker  . Packs/day: 3.00  . Years: 20.00  . Pack years: 60.00  . Types: Cigarettes  . Quit date: 11/03/1972  . Years since quitting: 46.6  Smokeless Tobacco Never  Used   Counseling given: Yes   Continue to not smoke  Outpatient Encounter Medications as of 05/30/2019  Medication Sig  . atenolol (TENORMIN) 25 MG tablet Take 25 mg by mouth 2 (two) times daily.   . cholecalciferol (VITAMIN D3) 25 MCG (1000 UT) tablet Take 1,000 Units by mouth daily.  . hydrocortisone (ANUSOL-HC) 2.5 % rectal cream Place 1 application rectally 2 (two) times daily. (Patient taking differently: Place 1 application rectally 2 (two) times daily as needed for hemorrhoids. )  . lactobacillus acidophilus (BACID) TABS tablet Take 1 tablet by mouth daily.  . mirtazapine (REMERON) 7.5 MG tablet Take 7.5 mg by mouth at bedtime.  . potassium chloride (K-DUR) 10 MEQ tablet Take 10 mEq by mouth daily.   . simvastatin (ZOCOR) 20 MG tablet Take 20 mg by mouth at bedtime.   . vitamin B-12 (CYANOCOBALAMIN) 1000 MCG tablet Take 1,000 mcg by mouth daily.   . [DISCONTINUED] ondansetron (ZOFRAN) 4 MG tablet Take 1 tablet (4 mg total) by mouth 2 (two) times daily as needed for nausea or vomiting.  . [DISCONTINUED] Simethicone (GAS-X PO) Take 1 capsule by mouth as needed (bloating).   . [DISCONTINUED] diphenhydrAMINE (BENADRYL) 25 MG tablet Take 25 mg by mouth 2 (two) times daily.  . [DISCONTINUED] doxylamine,  Sleep, (UNISOM) 25 MG tablet Take 25 mg by mouth at bedtime.   . [DISCONTINUED] rOPINIRole (REQUIP) 2 MG tablet Take 2 mg by mouth at bedtime.    No facility-administered encounter medications on file as of 05/30/2019.      Review of Systems  Review of Systems  Constitutional: Negative for activity change, fatigue and fever.  HENT: Negative for sinus pressure, sinus pain and sore throat.   Respiratory: Negative for cough, shortness of breath and wheezing.   Cardiovascular: Negative for chest pain and palpitations.  Gastrointestinal: Positive for diarrhea. Negative for nausea and vomiting.  Musculoskeletal: Negative for arthralgias.  Neurological: Negative for dizziness.  Psychiatric/Behavioral: Negative for sleep disturbance. The patient is not nervous/anxious.      Physical Exam  BP 116/64   Pulse 75   Temp (!) 97.4 F (36.3 C) (Temporal)   Ht _0  (1.651 m)   Wt 121 lb 6.4 oz (55.1 kg)   SpO2 97%   BMI 20.20 kg/m   Wt Readings from Last 5 Encounters:  05/30/19 121 lb 6.4 oz (55.1 kg)  05/27/19 120 lb (54.4 kg)  04/26/19 124 lb (56.2 kg)  02/02/19 134 lb 12.8 oz (61.1 kg)  10/12/18 140 lb 3.4 oz (63.6 kg)    BMI Readings from Last 5 Encounters:  05/30/19 20.20 kg/m  05/27/19 19.97 kg/m  04/26/19 20.63 kg/m  02/02/19 22.43 kg/m  10/12/18 23.33 kg/m     Physical Exam Vitals signs and nursing note reviewed.  Constitutional:      General: She is not in acute distress.    Appearance: Normal appearance. She is normal weight.  HENT:     Head: Normocephalic and atraumatic.     Right Ear: Tympanic membrane, ear canal and external ear normal. There is no impacted cerumen.     Left Ear: Tympanic membrane, ear canal and external ear normal. There is no impacted cerumen.     Nose: Nose normal. No congestion or rhinorrhea.     Mouth/Throat:     Mouth: Mucous membranes are moist.     Pharynx: Oropharynx is clear.  Eyes:     Pupils: Pupils are equal, round, and  reactive to light.  Neck:     Musculoskeletal: Normal range of motion.  Cardiovascular:     Rate and Rhythm: Normal rate and regular rhythm.     Pulses: Normal pulses.     Heart sounds: Normal heart sounds. No murmur.  Pulmonary:     Effort: Pulmonary effort is normal. No respiratory distress.     Breath sounds: No decreased air movement. Examination of the right-middle field reveals rales. Examination of the left-middle field reveals rales. Examination of the right-lower field reveals rales. Examination of the left-lower field reveals rales. Rales present. No decreased breath sounds or wheezing.  Skin:    General: Skin is warm and dry.     Capillary Refill: Capillary refill takes less than 2 seconds.  Neurological:     General: No focal deficit present.     Mental Status: She is alert and oriented to person, place, and time. Mental status is at baseline.     Gait: Gait normal.  Psychiatric:        Mood and Affect: Mood normal.        Behavior: Behavior normal.        Thought Content: Thought content normal.        Judgment: Judgment normal.       Assessment & Plan:   IPF (idiopathic pulmonary fibrosis) (HCC) History of recurrent drug holidays from both of due to diarrhea Most recent being in September/2020.  Patient remains off of 0FEV at this time. Patient has upcoming GI work-up and evaluation on 05/31/2019 Patient endorsing today that she feels significantly better since stopping anti-fibrotic's.  Plan: Walk today in office Continue forward with GI work-up on 05/31/2019 We will bring patient back for close evaluation last week of October to see Dr. Chase Caller and discuss whether or not we would like to proceed forward with restarting anti-fibrotic's or continue to remain off of them as patient has had significant struggles with 0FEV as well as intolerant of Esbriet  Drug-induced diarrhea Plan: Continue to remain off of OFEV Continue follow-up with gastroenterology     Return in about 3 weeks (around 06/20/2019), or if symptoms worsen or fail to improve, for Follow up with Dr. Purnell Shoemaker - 37mn ILD slot.   BLauraine Rinne NP 05/30/2019   This appointment was 26 minutes long with over 50% of the time in direct face-to-face patient care, assessment, plan of care, and follow-up.

## 2019-05-30 NOTE — Patient Instructions (Addendum)
You were seen today by Lauraine Rinne, NP  for:   1. IPF (idiopathic pulmonary fibrosis) ([place])  Walk today in office  Remain off of OFEV  Continue to remain physically active  2. Drug-induced diarrhea  Complete GI evaluation on 05/31/2019  Follow-up with our office towards the end of October with Dr. Chase Caller where we can discuss if we would like to proceed forward with restarting anti-fibrotic's work to keep you off of them at this time   Follow Up:     Return in about 3 weeks (around 06/20/2019), or if symptoms worsen or fail to improve, for Follow up with Dr. Purnell Shoemaker - 88mn ILD slot.   Please do your part to reduce the spread of COVID-19:      Reduce your risk of any infection  and COVID19 by using the similar precautions used for avoiding the common cold or flu:  .Marland KitchenWash your hands often with soap and warm water for at least 20 seconds.  If soap and water are not readily available, use an alcohol-based hand sanitizer with at least 60% alcohol.  . If coughing or sneezing, cover your mouth and nose by coughing or sneezing into the elbow areas of your shirt or coat, into a tissue or into your sleeve (not your hands). .Langley GaussA MASK when in public  . Avoid shaking hands with others and consider head nods or verbal greetings only. . Avoid touching your eyes, nose, or mouth with unwashed hands.  . Avoid close contact with people who are sick. . Avoid places or events with large numbers of people in one location, like concerts or sporting events. . If you have some symptoms but not all symptoms, continue to monitor at home and seek medical attention if your symptoms worsen. . If you are having a medical emergency, call 911.   [facility] [place]/ e-Visit: [email or web address]        [facility]: [facility] [phone]                  [facility] [phone]    It is flu season:   >>> Best ways to protect herself from the flu: Receive the yearly flu vaccine, practice good hand hygiene washing with soap and also using hand sanitizer when available, eat a nutritious meals, get adequate rest, hydrate appropriately   Please contact the office if your symptoms worsen or you have concerns that you are not improving.   Thank you for choosing  [facility] for your healthcare, and for allowing [facility] partner with you on your healthcare journey. I am thankful to be able to provide care to you today.   BWyn QuakerFNP-C

## 2019-05-30 NOTE — Assessment & Plan Note (Signed)
Plan: Continue to remain off of OFEV Continue follow-up with gastroenterology

## 2019-05-30 NOTE — OR Nursing (Signed)
Patient did not have Covid test done on 10/2 as scheduled Notified patient who said "she didn't know what she had had done." Informed patient to drink prep at 0400 in the am and to be at the Covid testing site at 0800 for a rapid Covid test. Patient verbalized understanding.

## 2019-05-30 NOTE — Assessment & Plan Note (Signed)
History of recurrent drug holidays from both of due to diarrhea Most recent being in September/2020.  Patient remains off of 0FEV at this time. Patient has upcoming GI work-up and evaluation on 05/31/2019 Patient endorsing today that she feels significantly better since stopping anti-fibrotic's.  Plan: Walk today in office Continue forward with GI work-up on 05/31/2019 We will bring patient back for close evaluation last week of October to see Dr. Chase Caller and discuss whether or not we would like to proceed forward with restarting anti-fibrotic's or continue to remain off of them as patient has had significant struggles with 0FEV as well as intolerant of Esbriet

## 2019-05-31 ENCOUNTER — Ambulatory Visit (HOSPITAL_COMMUNITY)
Admission: RE | Admit: 2019-05-31 | Discharge: 2019-05-31 | Disposition: A | Discharge status: Home / Self Care | Payer: Medicare HMO | Attending: Gastroenterology | Admitting: Gastroenterology

## 2019-05-31 ENCOUNTER — Other Ambulatory Visit (HOSPITAL_COMMUNITY)
Admission: RE | Admit: 2019-05-31 | Discharge: 2019-05-31 | Disposition: A | Discharge status: Home / Self Care | Payer: Medicare HMO | Source: Ambulatory Visit | Attending: Psychiatry | Admitting: Psychiatry

## 2019-05-31 ENCOUNTER — Ambulatory Visit (HOSPITAL_COMMUNITY): Payer: Medicare HMO | Admitting: Anesthesiology

## 2019-05-31 ENCOUNTER — Encounter (HOSPITAL_COMMUNITY): Payer: Self-pay | Admitting: Gastroenterology

## 2019-05-31 ENCOUNTER — Encounter (HOSPITAL_COMMUNITY)
Admission: RE | Disposition: A | Discharge status: Home / Self Care | Payer: Self-pay | Source: Home / Self Care | Attending: Gastroenterology

## 2019-05-31 ENCOUNTER — Other Ambulatory Visit (HOSPITAL_COMMUNITY): Payer: Medicare HMO

## 2019-05-31 ENCOUNTER — Other Ambulatory Visit: Payer: Self-pay

## 2019-05-31 DIAGNOSIS — K625 Hemorrhage of anus and rectum: Secondary | ICD-10-CM

## 2019-05-31 DIAGNOSIS — K648 Other hemorrhoids: Secondary | ICD-10-CM | POA: Diagnosis not present

## 2019-05-31 DIAGNOSIS — R933 Abnormal findings on diagnostic imaging of other parts of digestive tract: Secondary | ICD-10-CM

## 2019-05-31 DIAGNOSIS — K222 Esophageal obstruction: Secondary | ICD-10-CM

## 2019-05-31 DIAGNOSIS — Z79899 Other long term (current) drug therapy: Secondary | ICD-10-CM | POA: Insufficient documentation

## 2019-05-31 DIAGNOSIS — M1990 Unspecified osteoarthritis, unspecified site: Secondary | ICD-10-CM | POA: Insufficient documentation

## 2019-05-31 DIAGNOSIS — Q438 Other specified congenital malformations of intestine: Secondary | ICD-10-CM | POA: Insufficient documentation

## 2019-05-31 DIAGNOSIS — R112 Nausea with vomiting, unspecified: Secondary | ICD-10-CM

## 2019-05-31 DIAGNOSIS — K649 Unspecified hemorrhoids: Secondary | ICD-10-CM | POA: Diagnosis not present

## 2019-05-31 DIAGNOSIS — Z20828 Contact with and (suspected) exposure to other viral communicable diseases: Secondary | ICD-10-CM | POA: Diagnosis not present

## 2019-05-31 DIAGNOSIS — K921 Melena: Secondary | ICD-10-CM | POA: Diagnosis not present

## 2019-05-31 DIAGNOSIS — I10 Essential (primary) hypertension: Secondary | ICD-10-CM | POA: Insufficient documentation

## 2019-05-31 DIAGNOSIS — F329 Major depressive disorder, single episode, unspecified: Secondary | ICD-10-CM | POA: Insufficient documentation

## 2019-05-31 DIAGNOSIS — K219 Gastro-esophageal reflux disease without esophagitis: Secondary | ICD-10-CM | POA: Insufficient documentation

## 2019-05-31 DIAGNOSIS — K5730 Diverticulosis of large intestine without perforation or abscess without bleeding: Secondary | ICD-10-CM | POA: Insufficient documentation

## 2019-05-31 DIAGNOSIS — K2970 Gastritis, unspecified, without bleeding: Secondary | ICD-10-CM | POA: Diagnosis not present

## 2019-05-31 DIAGNOSIS — K529 Noninfective gastroenteritis and colitis, unspecified: Secondary | ICD-10-CM

## 2019-05-31 DIAGNOSIS — E7800 Pure hypercholesterolemia, unspecified: Secondary | ICD-10-CM | POA: Insufficient documentation

## 2019-05-31 DIAGNOSIS — K449 Diaphragmatic hernia without obstruction or gangrene: Secondary | ICD-10-CM | POA: Diagnosis not present

## 2019-05-31 HISTORY — PX: COLONOSCOPY WITH PROPOFOL: SHX5780

## 2019-05-31 HISTORY — PX: ESOPHAGOGASTRODUODENOSCOPY (EGD) WITH PROPOFOL: SHX5813

## 2019-05-31 LAB — SARS CORONAVIRUS 2 BY RT PCR (HOSPITAL ORDER, PERFORMED IN ~~LOC~~ HOSPITAL LAB): SARS Coronavirus 2: NEGATIVE

## 2019-05-31 SURGERY — COLONOSCOPY WITH PROPOFOL
Anesthesia: General

## 2019-05-31 MED ORDER — EPHEDRINE 5 MG/ML INJ
INTRAVENOUS | Status: AC
  Filled 2019-05-31: qty 10

## 2019-05-31 MED ORDER — LIDOCAINE HCL (CARDIAC) PF 100 MG/5ML IV SOSY
PREFILLED_SYRINGE | INTRAVENOUS | Status: DC | PRN
  Administered 2019-05-31: 50 mg via INTRAVENOUS

## 2019-05-31 MED ORDER — PHENYLEPHRINE 40 MCG/ML (10ML) SYRINGE FOR IV PUSH (FOR BLOOD PRESSURE SUPPORT)
PREFILLED_SYRINGE | INTRAVENOUS | Status: AC
  Filled 2019-05-31: qty 10

## 2019-05-31 MED ORDER — LACTATED RINGERS IV SOLN
Freq: Once | INTRAVENOUS | Status: AC
  Administered 2019-05-31: 11:00:00 via INTRAVENOUS

## 2019-05-31 MED ORDER — PROPOFOL 10 MG/ML IV BOLUS
INTRAVENOUS | Status: AC
  Filled 2019-05-31: qty 60

## 2019-05-31 MED ORDER — EPHEDRINE SULFATE 50 MG/ML IJ SOLN
INTRAMUSCULAR | Status: DC | PRN
  Administered 2019-05-31: 10 mg via INTRAVENOUS

## 2019-05-31 MED ORDER — PROPOFOL 500 MG/50ML IV EMUL
INTRAVENOUS | Status: DC | PRN
  Administered 2019-05-31: 150 ug/kg/min via INTRAVENOUS

## 2019-05-31 MED ORDER — PHENYLEPHRINE HCL (PRESSORS) 10 MG/ML IV SOLN
INTRAVENOUS | Status: DC | PRN
  Administered 2019-05-31: 80 ug via INTRAVENOUS

## 2019-05-31 MED ORDER — LACTATED RINGERS IV SOLN
INTRAVENOUS | Status: DC | PRN
  Administered 2019-05-31: 11:00:00 via INTRAVENOUS

## 2019-05-31 MED ORDER — CHLORHEXIDINE GLUCONATE CLOTH 2 % EX PADS: 6.0000 | MEDICATED_PAD | Freq: Once | CUTANEOUS | Status: DC

## 2019-05-31 MED ORDER — PROPOFOL 10 MG/ML IV BOLUS
INTRAVENOUS | Status: DC | PRN
  Administered 2019-05-31 (×3): 20 mg via INTRAVENOUS
  Administered 2019-05-31: 10 mg via INTRAVENOUS
  Administered 2019-05-31 (×3): 20 mg via INTRAVENOUS

## 2019-05-31 MED ORDER — LIDOCAINE 2% (20 MG/ML) 5 ML SYRINGE
INTRAMUSCULAR | Status: AC
  Filled 2019-05-31: qty 5

## 2019-05-31 NOTE — H&P (Signed)
Primary Care Physician:  Glenda Chroman, MD Primary Gastroenterologist:  Dr. Oneida Alar  Pre-Procedure History & Physical: HPI:  Ashlee Martin is a 85 y.o. female here for BRBPR/diarrhea/ abnormal CT SCAN: THICKENED GASTRIC WALL IN FUNDUS.  Past Medical History:  Diagnosis Date  . Arthritis   . Depression   . GERD (gastroesophageal reflux disease)   . Hypercholesterolemia   . Hypertension   . ILD (interstitial lung disease) ([place])   . Psoriasis    had been on Humira but continued to have UTIs, sinus infections, etc. Taken off  . Seizure ([place])    had 3-4 seizures, 8 years ago, placed on medication and has had no seizures since.  . Seizures ([place])     Past Surgical History:  Procedure Laterality Date  . BIOPSY  04/20/2018   Procedure: BIOPSY;  Surgeon: Danie Binder, MD;  Location[facility];  Service: Endoscopy;;  gastric  . COLONOSCOPY  01/2011   Dr. Anthony Sar: normal.   . DILATION AND CURETTAGE OF UTERUS     x3  . ESOPHAGOGASTRODUODENOSCOPY (EGD) WITH PROPOFOL N/A 04/20/2018   benign-appearing, intrinsic mild stenosis, s/p dilation. Mild inflammation entire stomach, s/p biopsy that was negative for H.pylori. Pylorus normal but had changes consistent with PRIOR ulcer disease. Duodenum normal.   . SAVORY DILATION N/A 04/20/2018   Procedure: SAVORY DILATION;  Surgeon: Danie Binder, MD;  Location: [facility];  Service: Endoscopy;  Laterality: N/A;  . VIDEO BRONCHOSCOPY Bilateral 01/07/2018   Procedure: VIDEO BRONCHOSCOPY WITHOUT FLUORO;  Surgeon: Brand Males, MD;  Location: [facility];  Service: Cardiopulmonary;  Laterality: Bilateral;  . WRIST SURGERY Right    ORIF    Prior to Admission medications   Medication Sig Start Date End Date Taking? Authorizing Provider  atenolol (TENORMIN) 25 MG tablet Take 25 mg by mouth 2 (two) times daily.  10/21/17  Yes [provider]  cholecalciferol (VITAMIN D3) 25 MCG (1000 UT) tablet Take 1,000 Units by mouth daily.   Yes  [provider]  hydrocortisone (ANUSOL-HC) 2.5 % rectal cream Place 1 application rectally 2 (two) times daily. Patient taking differently: Place 1 application rectally 2 (two) times daily as needed for hemorrhoids.  03/09/19  Yes Annitta Needs, NP  lactobacillus acidophilus (BACID) TABS tablet Take 1 tablet by mouth daily.   Yes [provider]  mirtazapine (REMERON) 7.5 MG tablet Take 7.5 mg by mouth at bedtime.   Yes [provider]  potassium chloride (K-DUR) 10 MEQ tablet Take 10 mEq by mouth daily.  04/11/19  Yes [provider]  simvastatin (ZOCOR) 20 MG tablet Take 20 mg by mouth at bedtime.  09/01/17  Yes [provider]  vitamin B-12 (CYANOCOBALAMIN) 1000 MCG tablet Take 1,000 mcg by mouth daily.    Yes [provider]    Allergies as of 05/10/2019 - Review Complete 05/03/2019  Allergen Reaction Noted  . Humira [adalimumab] Other (See Comments) 11/03/2017  . Pirfenidone Other (See Comments) 04/27/2018    Family History  Problem Relation Age of Onset  . Heart disease Mother   . Heart disease Father   . Cancer Sister        GYN  . Colon cancer Neg Hx     Social History   Socioeconomic History  . Marital status: Married    Spouse name: Not on file  . Number of children: Not on file  . Years of education: Not on file  . Highest education level: Not  on file  Occupational History  . Occupation: Retired  Scientific laboratory technician  . Financial resource strain: Not on file  . Food insecurity    Worry: Not on file    Inability: Not on file  . Transportation needs    Medical: Not on file    Non-medical: Not on file  Tobacco Use  . Smoking status: Former Smoker    Packs/day: 3.00    Years: 20.00    Pack years: 60.00    Types: Cigarettes    Quit date: 11/03/1972    Years since quitting: 46.6  . Smokeless tobacco: Never Used  Substance and Sexual Activity  . Alcohol use: Yes    Alcohol/week: 7.0 standard drinks    Types: 7 Shots  of liquor per week    Comment: May have before bed  . Drug use: Never  . Sexual activity: Yes    Birth control/protection: None  Lifestyle  . Physical activity    Days per week: Not on file    Minutes per session: Not on file  . Stress: Not on file  Relationships  . Social Herbalist on phone: Not on file    Gets together: Not on file    Attends religious service: Not on file    Active member of club or organization: Not on file    Attends meetings of clubs or organizations: Not on file    Relationship status: Not on file  . Intimate partner violence    Fear of current or ex partner: Not on file    Emotionally abused: Not on file    Physically abused: Not on file    Forced sexual activity: Not on file  Other Topics Concern  . Not on file  Social History Narrative  . Not on file    Review of Systems: See HPI, otherwise negative ROS   Physical Exam: There were no vitals taken for this visit. General:   Alert,  pleasant and cooperative in NAD Head:  Normocephalic and atraumatic. Neck:  Supple; Lungs:  Clear throughout to auscultation.    Heart:  Regular rate and rhythm. Abdomen:  Soft, nontender and nondistended. Normal bowel sounds, without guarding, and without rebound.   Neurologic:  Alert and  oriented x4;  grossly normal neurologically.  Impression/Plan:   BRBPR/diarrhea/ abnormal CT SCAN: THICKENED GASTRIC WALL IN FUNDUS  PLAN: TCS WITH BIOPSY/EGD TODAY.DISCUSSED PROCEDURE, BENEFITS, & RISKS: < 1% chance of medication reaction, bleeding, perforation, ASPIRATION, or rupture of spleen/liver requiring surgery to fix it and missed polyps < 1 cm 10-20% of the time.

## 2019-05-31 NOTE — Op Note (Signed)
[facility] Patient Name: Ashlee Martin Procedure Date: 05/31/2019 11:24 AM MRN: [id number] Date of Birth: 1930-12-18 Attending MD: Barney Drain MD, MD CSN: [id number] Age: 85 Admit Type: Outpatient Procedure:                Colonoscopy, DIAGNOSTIC Indications:              Hematochezia. DIARRHEA RESOLVED AFTER OFEV STOPPED. Providers:                Barney Drain MD, MD, Jeanann Lewandowsky. Sharon Seller, RN, Nelma Rothman, Technician Referring MD:             Glenda Chroman Medicines:                Propofol per Anesthesia Complications:            HR DROPPED TO 40s AS SCOPE WITHDRAWN THROUGH THE                            SIGMOID COLON. Estimated Blood Loss:     Estimated blood loss: none. Procedure:                Pre-Anesthesia Assessment:                           - Prior to the procedure, a History and Physical                            was performed, and patient medications and                            allergies were reviewed. The patient's tolerance of                            previous anesthesia was also reviewed. The risks                            and benefits of the procedure and the sedation                            options and risks were discussed with the patient.                            All questions were answered, and informed consent                            was obtained. Prior Anticoagulants: The patient has                            taken no previous anticoagulant or antiplatelet                            agents. ASA Grade Assessment: II - A patient with  mild systemic disease. After reviewing the risks                            and benefits, the patient was deemed in                            satisfactory condition to undergo the procedure.                            After obtaining informed consent, the colonoscope                            was passed under direct vision. Throughout the         procedure, the patient's blood pressure, pulse, and                            oxygen saturations were monitored continuously. The                            PCF-H190DL ([id number]) was introduced through the                            anus and advanced to the 10 cm into the ileum. The                            colonoscopy was somewhat difficult due to                            significant looping. Successful completion of the                            procedure was aided by straightening and shortening                            the scope to obtain bowel loop reduction and                            COLOWRAP. The patient tolerated the procedure well.                            The quality of the bowel preparation was good. The                            terminal ileum, ileocecal valve, appendiceal                            orifice, and rectum were photographed. Scope In: 11:43:29 AM Scope Out: 11:58:21 AM Scope Withdrawal Time: 0 hours 11 minutes 26 seconds  Total Procedure Duration: 0 hours 14 minutes 52 seconds  Findings:      The terminal ileum appeared normal.      Multiple small and large-mouthed diverticula were found in the       recto-sigmoid colon, sigmoid colon, transverse colon and ascending colon.  The recto-sigmoid colon, sigmoid colon and descending colon were       moderately tortuous.      External and internal hemorrhoids were found. Impression:               - The examined portion of the ileum was normal.                           - Diverticulosis in the recto-sigmoid colon, in the                            sigmoid colon, in the transverse colon and in the                            ascending colon.                           - Tortuous colon.                           - RECTAL BLEEDING DUE TO internal hemorrhoids.                           - NO SOURCE FOR WEIGHT LOSS IDENTIFIED. Moderate Sedation:      Per Anesthesia Care Recommendation:           -  Patient has a contact number available for                            emergencies. The signs and symptoms of potential                            delayed complications were discussed with the                            patient. Return to normal activities tomorrow.                            Written discharge instructions were provided to the                            patient.                           - High fiber diet.                           - Continue present medications.                           - Repeat colonoscopy is not recommended due to                            current age (70 years or older) for surveillance.                           - Return to GI office in 6  months. Procedure Code(s):        --- Professional ---                           [phone], Colonoscopy, flexible; diagnostic, including                            collection of specimen(s) by brushing or washing,                            when performed (separate procedure) Diagnosis Code(s):        --- Professional ---                           K64.8, Other hemorrhoids                           K92.1, Melena (includes Hematochezia)                           K57.30, Diverticulosis of large intestine without                            perforation or abscess without bleeding                           Q43.8, Other specified congenital malformations of                            intestine CPT copyright 2019 American [employer] Association. All rights reserved. The codes documented in this report are preliminary and upon coder review may  be revised to meet current compliance requirements. Barney Drain, MD Barney Drain MD, MD 05/31/2019 12:37:50 PM This report has been signed electronically. Number of Addenda: 0

## 2019-05-31 NOTE — Discharge Instructions (Signed)
YOU DID NOT HAVE ANY POLYPS. Your rectal bleeding is due to hemorrhoids. YOU HAVE DIVERTICULOSIS IN YOUR RIGHT AND LEFT COLON.   YOU STILL HAVE AN ESOPHAGEAL STRICTURE AND A SMALL HIATAL HERNIA. YOU HAVE MILD CASE OF GASTRITIS.    DRINK WATER TO KEEP YOUR URINE LIGHT YELLOW.  FOLLOW A HIGH FIBER DIET. AVOID ITEMS THAT CAUSE BLOATING. SEE INFO BELOW.  Follow up in 6 MOS.      ENDOSCOPY Care After Read the instructions outlined below and refer to this sheet in the next week. These discharge instructions provide you with general information on caring for yourself after you leave the hospital. While your treatment has been planned according to the most current medical practices available, unavoidable complications occasionally occur. If you have any problems or questions after discharge, call DR. Mohsin Crum, [phone].  ACTIVITY  You may resume your regular activity, but move at a slower pace for the next 24 hours.   Take frequent rest periods for the next 24 hours.   Walking will help get rid of the air and reduce the bloated feeling in your belly (abdomen).   No driving for 24 hours (because of the medicine (anesthesia) used during the test).   You may shower.   Do not sign any important legal documents or operate any machinery for 24 hours (because of the anesthesia used during the test).    NUTRITION  Drink plenty of fluids.   You may resume your normal diet as instructed by your doctor.   Begin with a light meal and progress to your normal diet. Heavy or fried foods are harder to digest and may make you feel sick to your stomach (nauseated).   Avoid alcoholic beverages for 24 hours or as instructed.    MEDICATIONS  You may resume your normal medications.   WHAT YOU CAN EXPECT TODAY  Some feelings of bloating in the abdomen.   Passage of more gas than usual.   Spotting of blood in your stool or on the toilet paper  .  IF YOU HAD POLYPS REMOVED DURING THE  ENDOSCOPY:  Eat a soft diet IF YOU HAVE NAUSEA, BLOATING, ABDOMINAL PAIN, OR VOMITING.    FINDING OUT THE RESULTS OF YOUR TEST Not all test results are available during your visit. DR. Oneida Alar WILL CALL YOU WITHIN 14 DAYS OF YOUR PROCEDUE WITH YOUR RESULTS. Do not assume everything is normal if you have not heard from DR. Nishan Ovens, CALL HER OFFICE AT [phone].  SEEK IMMEDIATE MEDICAL ATTENTION AND CALL THE OFFICE: [phone] IF:  You have more than a spotting of blood in your stool.   Your belly is swollen (abdominal distention).   You are nauseated or vomiting.   You have a temperature over 101F.   You have abdominal pain or discomfort that is severe or gets worse throughout the day.  High-Fiber Diet A high-fiber diet changes your normal diet to include more whole grains, legumes, fruits, and vegetables. Changes in the diet involve replacing refined carbohydrates with unrefined foods. The calorie level of the diet is essentially unchanged. The Dietary Reference Intake (recommended amount) for adult males is 38 grams per day. For adult females, it is 25 grams per day. Pregnant and lactating women should consume 28 grams of fiber per day. Fiber is the intact part of a plant that is not broken down during digestion. Functional fiber is fiber that has been isolated from the plant to provide a beneficial effect in the body.  PURPOSE  Increase stool bulk.   Ease and regulate bowel movements.   Lower cholesterol.   REDUCE RISK OF COLON CANCER  INDICATIONS THAT YOU NEED MORE FIBER  Constipation and hemorrhoids.   Uncomplicated diverticulosis (intestine condition) and irritable bowel syndrome.   Weight management.   As a protective measure against hardening of the arteries (atherosclerosis), diabetes, and cancer.   GUIDELINES FOR INCREASING FIBER IN THE DIET  Start adding fiber to the diet slowly. A gradual increase of about 5 more grams (2 servings of most fruits or  vegetables) per day is best. Too rapid an increase in fiber may result in constipation, flatulence, and bloating.   Drink enough water and fluids to keep your urine clear or pale yellow. Water, juice, or caffeine-free drinks are recommended. Not drinking enough fluid may cause constipation.   Eat a variety of high-fiber foods rather than one type of fiber.   Try to increase your intake of fiber through using high-fiber foods rather than fiber pills or supplements that contain small amounts of fiber.   The goal is to change the types of food eaten. Do not supplement your present diet with high-fiber foods, but replace foods in your present diet.     Hiatal Hernia A hiatal hernia occurs when a part of the stomach slides above the diaphragm. The diaphragm is the thin muscle separating the belly (abdomen) from the chest. A hiatal hernia can be something you are born with or develop over time. Hiatal hernias may allow stomach acid to flow back into your esophagus, the tube which carries food from your mouth to your stomach. If this acid causes problems it is called GERD (gastro-esophageal reflux disease).   Gastritis  Gastritis is an inflammation (the body's way of reacting to injury and/or infection) of the stomach. It is often caused by viral or bacterial (germ) infections. It can also be caused BY ASPIRIN, BC/GOODY POWDER'S, (IBUPROFEN) MOTRIN, OR ALEVE (NAPROXEN), chemicals (including alcohol), SPICY FOODS, and medications. This illness may be associated with generalized malaise (feeling tired, not well), [place] ABDOMINAL STOMACH cramps, and fever. One common bacterial cause of gastritis is an organism known as H. Pylori. This can be treated with antibiotics.

## 2019-05-31 NOTE — Transfer of Care (Signed)
Immediate Anesthesia Transfer of Care Note  Patient: Ashlee Martin  Procedure(s) Performed: COLONOSCOPY WITH PROPOFOL (N/A ) ESOPHAGOGASTRODUODENOSCOPY (EGD) WITH PROPOFOL (N/A )  Patient Location: PACU  Anesthesia Type:General  Level of Consciousness: awake  Airway & Oxygen Therapy: Patient Spontanous Breathing  Post-op Assessment: Report given to RN  Post vital signs: Reviewed and stable  Last Vitals:  Vitals Value Taken Time  BP 154/125 05/31/19 1213  Temp    Pulse 88 05/31/19 1214  Resp 18 05/31/19 1214  SpO2 87 % 05/31/19 1214  Vitals shown include unvalidated device data.  Last Pain:  Vitals:   05/31/19 1133  TempSrc:   PainSc: 0-No pain         Complications: No apparent anesthesia complications

## 2019-05-31 NOTE — Addendum Note (Signed)
Addendum  created 05/31/19 1340 by Ollen Bowl, CRNA   Charge Capture section accepted

## 2019-05-31 NOTE — Op Note (Signed)
[facility] Patient Name: Ashlee Martin Procedure Date: 05/31/2019 11:58 AM MRN: [id number] Date of Birth: 07-17-1931 Attending MD: Barney Drain MD, MD CSN: [id number] Age: 85 Admit Type: Outpatient Procedure:                [facility]oscopy, DIAGNOSTIC Indications:              Abnormal CT of the GI tract-THICKENED WALL AT THE                            FUNDUS. LAST EGD/DIL AUG 2019: FUNDUS:WNLs. Providers:                Barney Drain MD, MD, Jeanann Lewandowsky. Sharon Seller, RN, Aram Candela Referring MD:             Glenda Chroman Medicines:                Propofol per Anesthesia Complications:            No immediate complications. Estimated Blood Loss:     Estimated blood loss: none. Procedure:                Pre-Anesthesia Assessment:                           - Prior to the procedure, a History and Physical                            was performed, and patient medications and                            allergies were reviewed. The patient's tolerance of                            previous anesthesia was also reviewed. The risks                            and benefits of the procedure and the sedation                            options and risks were discussed with the patient.                            All questions were answered, and informed consent                            was obtained. Prior Anticoagulants: The patient has                            taken no previous anticoagulant or antiplatelet                            agents. ASA Grade Assessment: II - A patient with  mild systemic disease. After reviewing the risks                            and benefits, the patient was deemed in                            satisfactory condition to undergo the procedure.                            After obtaining informed consent, the endoscope was                            passed under direct vision. Throughout the           procedure, the patient's blood pressure, pulse, and                            oxygen saturations were monitored continuously. The                            GIF-H190 ([id number]) scope was introduced through the                            mouth, and advanced to the second part of duodenum.                            The patient tolerated the procedure well. The [place]                            GI endoscopy was accomplished without difficulty. Scope In: 12:03:10 PM Scope Out: 12:05:56 PM Total Procedure Duration: 0 hours 2 minutes 46 seconds  Findings:      Patchy mild inflammation characterized by congestion (edema) and       erythema was found in the gastric body and in the gastric antrum.      A small hiatal hernia was present.      One benign-appearing, intrinsic mild (non-circumferential scarring)       stenosis was found. This stenosis measured 1.5 cm (inner diameter). The       stenosis was traversed.      The examined duodenum was normal. Impression:               - MILD Gastritis.                           - FINDINGS ON CT LIKELY DUE TO SLIDING HIATAL HERNIA                           - Benign-appearing esophageal STRICTURE DUE TO ACID                            REFLUX Moderate Sedation:      Per Anesthesia Care Recommendation:           - Patient has a contact number available for  emergencies. The signs and symptoms of potential                            delayed complications were discussed with the                            patient. Return to normal activities tomorrow.                            Written discharge instructions were provided to the                            patient.                           - Resume previous diet.                           - Continue present medications.                           - Return to my office in 6 months. Procedure Code(s):        --- Professional ---                           [phone],  Esophagogastroduodenoscopy, flexible,                            transoral; diagnostic, including collection of                            specimen(s) by brushing or washing, when performed                            (separate procedure) Diagnosis Code(s):        --- Professional ---                           K29.70, Gastritis, unspecified, without bleeding                           K44.9, Diaphragmatic hernia without obstruction or                            gangrene                           K22.2, Esophageal obstruction                           R93.3, Abnormal findings on diagnostic imaging of                            other parts of digestive tract CPT copyright 2019 American Medical Association. All rights reserved. The codes documented in this report are preliminary and upon coder review may  be revised to meet current compliance requirements. Barney Drain, MD Barney Drain MD, MD  05/31/2019 12:29:00 PM This report has been signed electronically. Number of Addenda: 0

## 2019-05-31 NOTE — Telephone Encounter (Signed)
Late entry: spoke with Dr. Thornton Papas on 10/5. Definite abnormality noted on CT. Also noted thickening of duodenum.

## 2019-05-31 NOTE — Anesthesia Preprocedure Evaluation (Addendum)
Anesthesia Evaluation  Patient identified by MRN, date of birth, ID band Patient awake    Reviewed: Allergy & Precautions, NPO status , Patient's Chart, lab work & pertinent test results, reviewed documented beta blocker date and time   Airway Mallampati: II  TM Distance: >3 FB Neck ROM: Full    Dental  (+) Otani Dentures, Partial Lower, Dental Advisory Given   Pulmonary pneumonia, resolved, former smoker (80 pack years),    Pulmonary exam normal breath sounds clear to auscultation       Cardiovascular Exercise Tolerance: Good hypertension, Pt. on home beta blockers and Pt. on medications  Rhythm:Irregular Rate:Normal - Systolic murmurs, - Diastolic murmurs, - Friction Rub, - Carotid Bruit, - Peripheral Edema and - Systolic Click PVCs and PACs on 3 lead EKG,  As per patient, she has h/o extra beats and she is taking atenolol. Took atenolol this morning   Neuro/Psych Seizures -, Well Controlled,  PSYCHIATRIC DISORDERS Depression    GI/Hepatic Neg liver ROS, GERD  Medicated and Controlled,  Endo/Other  negative endocrine ROS  Renal/GU negative Renal ROS     Musculoskeletal  (+) Arthritis ,   Abdominal   Peds  Hematology  (+) anemia ,   Anesthesia Other Findings   Reproductive/Obstetrics                          Anesthesia Physical Anesthesia Plan  ASA: III  Anesthesia Plan: General   Post-op Pain Management:    Induction: Intravenous  PONV Risk Score and Plan: TIVA  Airway Management Planned: Nasal Cannula, Natural Airway and Simple Face Mask  Additional Equipment:   Intra-op Plan:   Post-operative Plan:   Informed Consent: I have reviewed the patients History and Physical, chart, labs and discussed the procedure including the risks, benefits and alternatives for the proposed anesthesia with the patient or authorized representative who has indicated his/her understanding and  acceptance.     Dental advisory given  Plan Discussed with: CRNA  Anesthesia Plan Comments: (Possible cancellation of the procedure if she gets too many PVCs)      Anesthesia Quick Evaluation

## 2019-05-31 NOTE — Anesthesia Postprocedure Evaluation (Signed)
Anesthesia Post Note  Patient: Ashlee Martin  Procedure(s) Performed: COLONOSCOPY WITH PROPOFOL (N/A ) ESOPHAGOGASTRODUODENOSCOPY (EGD) WITH PROPOFOL (N/A )  Patient location during evaluation: PACU Anesthesia Type: General Level of consciousness: awake and alert and oriented Pain management: pain level controlled Vital Signs Assessment: post-procedure vital signs reviewed and stable Respiratory status: spontaneous breathing Cardiovascular status: blood pressure returned to baseline Postop Assessment: no apparent nausea or vomiting Anesthetic complications: no     Last Vitals:  Vitals:   05/31/19 1043  BP: 132/71  Pulse: 76  Resp: 14  Temp: 36.5 C  SpO2: 100%    Last Pain:  Vitals:   05/31/19 1133  TempSrc:   PainSc: 0-No pain                 Loc Feinstein

## 2019-06-07 ENCOUNTER — Encounter (HOSPITAL_COMMUNITY): Payer: Self-pay | Admitting: Gastroenterology

## 2019-06-07 DIAGNOSIS — Z299 Encounter for prophylactic measures, unspecified: Secondary | ICD-10-CM | POA: Diagnosis not present

## 2019-06-07 DIAGNOSIS — R5383 Other fatigue: Secondary | ICD-10-CM | POA: Diagnosis not present

## 2019-06-07 DIAGNOSIS — J849 Interstitial pulmonary disease, unspecified: Secondary | ICD-10-CM | POA: Diagnosis not present

## 2019-06-07 DIAGNOSIS — Z96649 Presence of unspecified artificial hip joint: Secondary | ICD-10-CM | POA: Diagnosis not present

## 2019-06-07 DIAGNOSIS — R634 Abnormal weight loss: Secondary | ICD-10-CM | POA: Diagnosis not present

## 2019-06-07 DIAGNOSIS — I10 Essential (primary) hypertension: Secondary | ICD-10-CM | POA: Diagnosis not present

## 2019-06-07 DIAGNOSIS — J8410 Pulmonary fibrosis, unspecified: Secondary | ICD-10-CM | POA: Diagnosis not present

## 2019-06-07 DIAGNOSIS — R0902 Hypoxemia: Secondary | ICD-10-CM | POA: Diagnosis not present

## 2019-06-07 DIAGNOSIS — M1611 Unilateral primary osteoarthritis, right hip: Secondary | ICD-10-CM | POA: Diagnosis not present

## 2019-06-20 ENCOUNTER — Encounter: Payer: Self-pay | Admitting: Internal Medicine

## 2019-06-20 ENCOUNTER — Other Ambulatory Visit: Payer: Self-pay

## 2019-06-20 ENCOUNTER — Ambulatory Visit (INDEPENDENT_AMBULATORY_CARE_PROVIDER_SITE_OTHER): Payer: Medicare HMO | Admitting: Internal Medicine

## 2019-06-20 VITALS — BP 108/60 | HR 70 | Temp 97.4°F | Ht 65.0 in | Wt 125.0 lb

## 2019-06-20 DIAGNOSIS — J84112 Idiopathic pulmonary fibrosis: Secondary | ICD-10-CM | POA: Diagnosis not present

## 2019-06-20 DIAGNOSIS — J9611 Chronic respiratory failure with hypoxia: Secondary | ICD-10-CM

## 2019-06-20 NOTE — Patient Instructions (Signed)
ICD-10-CM   1. IPF (idiopathic pulmonary fibrosis) ([place])  J84.112   2. Chronic respiratory failure with hypoxia ([facility])  J96.11     Clinically stable Significant intolerance to both pirfenidone and nintedanib Glad you are doing better without pirfenidone and nitedanib  Plan  - continue o2 2-3L [place] as before  - glad you are interested in IV research protocol for IPF - do spirometry and dlco in 3 months - can do at [place]  - do HRCT in 3 months - can do at [place]  Followup  - 3 months or sooner - 30 min slot ILD clinic but after above  - can discuss research options at followup

## 2019-06-20 NOTE — Progress Notes (Signed)
PCP Glenda Chroman, MD  HPI  IOV 11/03/2017  Chief Complaint  Patient presents with   Consult    Self referral. Pt states she was diagnosed with pulmonary fibrosis x4 weeks ago. States she has c/o cough with green mucus, mild SOB. Denies any CP.    85 year old female self-referred for evaluation of pulmonary fibrosis.  She is fairly good historian.  She tells me that she has diagnosis of psoriasis of the skin in 2016 and was under the care of Dr. Channing Mutters in [place].  She was on Humira for 11 months taking monthly injections but her rash got bad in November 2017 and then she was treated with prednisone.  She went on vacation to [place] and then returned and then had a diagnosis of pneumonia.  She was hospitalized for this at [facility] in January 2018.  She was on oxygen and hospitalized for 3 or 4 days and then discharged.  After that she is always had some amount of residual cough and mild shortness of breath but starting approximately 6 months ago the cough and shortness of breath was more perceptible.  However in the last 2 months a significant acceleration in the severity of cough and shortness of breath.  The cough is described as waking up at night and associated with phlegm.  She also gets short of breath walking or hurrying on level ground or walking up a slight hill.  Her primary care physician did a CT chest with contrast in February 2019.  I do not have the image with me but I reviewed the report and he reports pulmonary fibrosis not otherwise specified.  She did end up in the emergency room on October 31, 2017 and is being discharged with [facility] and redness on taper which is helping.   American College of chest physicians interstitial lung disease questionnaire  Past medical history: Positive for seizures, history of pneumonia in January 2018, acid reflux disease, dry eyes, dry mouth, photophobia and arthralgia.  She has diagnosis of skin psoriasis.  This is not much of an issue at  this point in time  Personal exposure history: She started smoking cigarettes at 85 years of age and smoked 3-4 packs a day and quit in the mid 1980s.  She reports having had use street drugs.  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  She only occasionally visits the coop.  She opens the door and looks that she can out.  Otherwise no exposure to water damage or mold a humidifier [place] or hot tub or Jacuzzi or birds or feathered pillows.  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  Then she retired and moved to [place] in [place] where she lived in normal homes for 5 years up until 8 years ago and then she and her husband moved to [place], [place] where they live in a farm.  Pulmonary toxicity history: Other than exposure to Humira and short-term prednisone no other pulmonary toxicity history including nitrofurantoin or BCG or amiodarone or chemotherapy    Walking desaturation test on 11/03/2017 185 feet x 3 laps on ROOM AIR:  did walk at slow pace and got mildly dyspneic. Did desaturate to 89%. Rest pulse ox was 98%, final pulse ox was 89%. HR response 67/min at rest to 91/min at peak exertion. Patient Kirra Elie  Did not Desaturate <  88% . Miquel Brannick did yes  Desaturated </= 3% points. Joanie Bramble yes did get tachyardic    12/08/2017 Acute OV :Sinus congestion .  Patient presents for an acute office visit.  Patient complains of sinus congestion , ear fullness and popping , sinus pressure , drippy nose, watery eyes, very little cough .  Has had this for last few months , Worse for the last 2 weeks .  Seen by PCP last week started on allergy pill and nasal spray , she does not know what they were.  Says she has chronic allergy problems , worse in Spring and Fall.  She denies any discolored mucus, sinus pain, teeth pain, fever, nausea vomiting diarrhea, or chest  pain.    Patient was seen for pulmonary consult March 2019 to establish for pulmonary fibrosis.  She has underlying psoriasis.  Patient was set up for a CT chest that showed fibrotic interstitial lung disease with mild honeycombing.  Autoimmune panel was essentially negative except for SS B IgG antibody.  And sed rate was mildly elevated at 34. Patient has an upcoming PFT and follow-up visit with ILD clinic in 2 weeks. She was having a severe cough last visit.  She was given a prednisone taper.  Patient says she did require one other prednisone taper but her cough is almost totally resolved.  She has a little mild cough in the morning but very mild.   OV 12/29/2017  Chief Complaint  Patient presents with   Follow-up    Pt states she has had both good days and bad days. States she has had some problems with allergies.    Sherylann Alvira presns for followup for ILD workup.it is documented below.  Pulmonary function test in terms of severity shows moderate restriction with significant reduction in diffusion capacity.  Her high-resolution CT scan of the chest is read as probable UIP but I wonder if that is mixed emphysema although she quit smoking many decades ago and indeterminate pattern for UIP.  There might be air trapping but this is not reported.  I again went over her exposures and she confirms and her husband who is here with her today that there is significant chicken coop exposure ongoing currently.  There are no new interim issues other than the fact she had a respiratory exacerbation which required prednisone which seemed to help her symptoms considerably.  Currently she is off prednisone.  Her autoimmune vasculitis and hypersensitivity pneumonitis panel was essentially negative except for trace autoimmune positivity.     Results for LAMEISHA, SCHUENEMANN (MRN [id number]) as of 12/29/2017 13:37  Ref. Range 12/29/2017 12:43  FVC-Pre Latest Units: L 1.49  FVC-%Pred-Pre Latest Units: % 58  FEV1-Pre  Latest Units: L 1.18  FEV1-%Pred-Pre Latest Units: % 62  Pre FEV1/FVC ratio Latest Units: % 79  FEV1FVC-%Pred-Pre Latest Units: % 108  Results for AIYANA, STEGMANN (MRN [id number]) as of 12/29/2017 13:37  Ref. Range 12/29/2017 12:43  DLCO unc Latest Units: ml/min/mmHg 7.82  DLCO unc % pred Latest Units: % 30   Results for KAMERAN, LALLIER (MRN [id number]) as of 12/29/2017 13:37  Ref. Range 11/03/2017 10:08  ASPERGILLUS FUMIGATUS Latest Ref Range: NEGATIVE  NEGATIVE  Pigeon Serum Latest Ref Range: NEGATIVE  NEGATIVE  Anit Nuclear Antibody(ANA) Latest Ref Range: NEGATIVE  NEGATIVE  Angiotensin-Converting Enzyme Latest Ref Range: 9 - 67 U/L 28  Cyclic Citrullin Peptide Ab Latest Units: UNITS <16  ds DNA Ab Latest Units: IU/mL <1  ENA RNP Ab Latest Ref  Range: 0.0 - 0.9 AI <0.2  Myeloperoxidase Abs Latest Units: AI <1.0  Serine Protease 3 Latest Units: AI <1.0  RA Latex Turbid. Latest Ref Range: <14 IU/mL <14  SSA (Ro) (ENA) Antibody, IgG Latest Ref Range: <1.0 NEG AI <1.0 NEG  SSB (La) (ENA) Antibody, IgG Latest Ref Range: <1.0 NEG AI 1.8 POS (A)  Scleroderma (Scl-70) (ENA) Antibody, IgG Latest Ref Range: <1.0 NEG AI <1.0 NEG    IMPRESSION: 1. Spectrum of findings compatible with fibrotic interstitial lung disease with mild honeycombing. Despite the absence of a clear basilar gradient, these findings are considered to represent probable usual interstitial pneumonia (UIP). Fibrotic phase nonspecific interstitial pneumonia (NSIP) is on the differential. Follow-up high-resolution chest CT in 6-12 months would be useful to assess temporal pattern stability, as clinically warranted. 2. Tiny 3 mm solid left lower lobe pulmonary nodule. No follow-up needed if patient is low-risk. Non-contrast chest CT can be considered in 12 months if patient is high-risk. This recommendation follows the consensus statement: Guidelines for Management of Incidental Pulmonary Nodules Detected on CT Images:From  the Fleischner Society 2017; published online before print (10.1148/radiol.1610960454). 3. Three-vessel coronary atherosclerosis. 4. Dilated main pulmonary artery, suggesting pulmonary arterial hypertension.  Aortic Atherosclerosis (ICD10-I70.0).   Electronically Signed   By: Ilona Sorrel M.D.   On: 11/13/2017 16:46   OV 02/02/2018  Chief Complaint  Patient presents with   Follow-up    Pt states things have been up and down since last visit and since bronch was performed. Pt still has problems with upset stomach and pt is still having problems with her breathing. Pt does cough in the morning with occ. green mucus. Denies any CP/chest tightness.    Follow-up interstitial lung disease.  This follow-up is after bronchoscopy with lavage which was done Jan 07, 2018.  This shows a slight preponderance of polymorphs and absence of lymphocytes thus suggesting this is not hypersensitivity pneumonitis but with the increase in polymorphs the diagnosis shift towards UIP.  I took a second opinion on radiology from Dr. Lorin Picket and she feels a CT scan is more consistent with UIP.  In the interim patient has no new complaints.  Walking desaturation test indicates a 7% pulse ox drop with tachycardia and shortness of breath but still does not drop below 88%.  Of note she does indicate that she has had chronic intermittent stress related nausea that has been worked up extensively but without any cause.   Results for KAIRAH, LEONI (MRN [id number]) as of 01/07/2018 20:04  Ref. Range 01/07/2018 14:35  Color, Fluid Latest Ref Range: YELLOW  STRAW (A)  WBC, Fluid Latest Ref Range: 0 - 1,000 cu mm 73  Lymphs, Fluid Latest Units: % 6  Eos, Fluid Latest Units: % 4  Appearance, Fluid Latest Ref Range: CLEAR  CLOUDY (A)  Other Cells, Fluid Latest Units: % CORRELATE WITH CY...  Neutrophil Count, Fluid Latest Ref Range: 0 - 25 % 55 (H)  Monocyte-Macrophage-Serous Fluid Latest Ref Range: 50 - 90 % 35 (L)      OV 03/15/2018  Chief Complaint  Patient presents with   Follow-up    Pt began taking Esbriet but had been having problems keeping food down while taking the med. Med was stopped 7/12 by MW due to the possible reaction and was told to stop med until OV with MR. Other than the beginning problems with Esbriet, pt states she has been doing good since last visit   [place] ,  85 y.o. , with dob [birth year] and female ,Not Hispanic or Latino from [address] [place] [address] - presents to ILD  clinic for IPF diagnosis (he is aage > 53 (age 34), probable UIP on CT scan with honeycombing early, negative autoimmune and vasculitis serology and bronchoscopy 01/07/18 with 55% neutrophils on lavage) made 02/02/18 and esbriet decision taken 02/02/18   In terms of her IPF she is doing stable. She does not feel any worse. She gets dyspneic when walking out of her air conditioned room to the hot humid air she does not feel any worse. Walking desaturation test showed desaturation to 87% which seems a little bit worse than before but she denies that she is worse. She does not want to use portable oxygen but she is willing to get herself tested at night. There is no worsening cough or sputum production.  The main issue appears to be that on 03/05/2018 she started her first dose of Pirfenidone (Esbriet). She took one dose and felt fine and then 4 hours later immediately after lunch 12 her food and had nausea and vomiting. She does not believe that the nausea and vomiting were related to the Pirfenidone (Esbriet). She insetead thinks it is a recurrence of her chronic intermittent nausea and vomiting which is had for the last few years. We took more history on this and she tells me that she's had this for the last few years. It is stable. She says primary care physician worked up extensively. However shnever  gastroenterologist or had an endoscopy for this.   OV 04/27/2018  Chief Complaint  Patient  presents with   Follow-up    Pt had endoscopy performed 8/27. Pt states she has been having problems with pain and states she still has occ SOB but that is better and has an occ cough in the mornings. Denies any CP.     Darthy Manganelli Deaton , 85 y.o. , with dob [birth year] and female ,Not Hispanic or Latino from [address] [place] [address] - presents to ILD  clinic for IPF diagnosis (he is aage > 68 (age 65), probable UIP on CT scan with honeycombing early, negative autoimmune and vasculitis serology and bronchoscopy 01/07/18 with 55% neutrophils on lavage) made 02/02/18 and esbriet decision taken 02/02/18 , restart date 03/15/18   Mechele Claude A Sammons - presents for follow-up of her IPF. Currently she restarted her Pirfenidone (Esbriet) 03/15/2018 and escalated to 2 pills 3 times a day which she has held at that dose. She says that her fatigue is gone from mild pre-Pirfenidone (Esbriet) 2 severe post Pirfenidone (Esbriet). In addition she has weight loss. Her weight loss is 9 poundssince she started the Pirfenidone (Esbriet). She is also having extremely poor appetite. She did seeGI Dr. Barney Drain and had endoscopy 04/20/2018. According to the patientit sounds like she might have had gastritis but I'm not so sure. She's also had ultrasound of the liver and is awaiting the results. She tells me 4 days up with endoscopy she did have one episodes of nausea and vomiting. She feels that the severe D of the nausea and vomiting and the frequency of this is at baseline and she does not think the Pirfenidone (Esbriet) has made this worse. However she is categorical that the fatigue low appetite and weight loss are related to Pirfenidone (Esbriet).She is using oxygen with pulmonary rehabilitation. She is more short of breath. Walking desaturation test today shows that she did desaturate much earlier  than at baseline. Also 03/31/2018 she had overnight oxygen study and the pulse ox was less than 88%  For 7-1/2 hours. She  is refusing to wear portable oxygen. She has agreed to wear oxygen during rehabilitation and at night  OV 05/26/2018  Subjective:  Patient ID: Cheryll Cockayne, female , DOB: 04/05/1931 , age 54 y.o. , MRN: [id number] , ADDRESS: [place] [id number]   05/26/2018 -   Chief Complaint  Patient presents with   Follow-up    PFT attempted but pt was unable to complete it. Pt has had worsening  and was admitted to the hospital 9/11-9/13 due to a UTI and O2 sats dropping to 70%. Pt states she does have a mild cough in the mornings. Denies any complaints of CP. Pt is now having to wear O2 due to her breathing worsening and is on 3L with exertion and 2L at rest.     HPI Hitomi A Witter 85 y.o. -presents IPF-year follow-up.  Last seen April 27, 2018.  Just before that end of August 2019 she underwent endoscopy that showed acid reflux and peptic stricture.  According to the patient she was stretched.  She has stopped as.  After the visit April 27, 2018.  Her vomiting episodes have improved although not completely resolved..  It is unclear if this is because of Pirfenidone (Esbriet) being stopped or because of the endoscopy.  In any event May 05, 2018 she was admitted at [facility].  She brought the outside records with me.  It appears that she had E. coli UTI.  In addition her BNP was high at 1600.  She had a CT scan of the chest that I was able to visualized.  There is evidence of worsening of ILD but this was in the form of a high BNP.  At this point in time she is better but somewhat fatigued.  She has been discharged with continuous oxygen although when we turned her oxygen off which was set at 3 L and turn it off for 20 minutes she had normal pulse ox at rest and desaturated at the first left.  So it appears that her hypoxemia is improved compared to admission but it is still worse compared to a visit 1 month ago.       ROS  OV 06/24/2018  Subjective:  Patient ID:  Cheryll Cockayne, female , DOB: 03-23-1931 , age 11 y.o. , MRN: [id number] , ADDRESS: [place] [phone]   06/24/2018 -   Chief Complaint  Patient presents with   Follow-up    States her breathing has been good at her basline. Stll concerned about her weight.      HPI Deeann A Cecilio 85 y.o. -has idiopathic pulmonary fibrosis.  Currently on supportive care of having significant side effects suspected from Pirfenidone (Esbriet).  At this point in time she is been off Pirfenidone (Esbriet) for several weeks.  She is now gained 5 pounds of weight.  Her appetite is returned to normal.  She still has an occasional mild intermittent nausea vomiting which is her baseline idiopathic problem.  She does not have any diarrhea.  She is also started using oxygen.  Without being on Esbriet and also being on oxygen she feels her quality of life is actually improved with less shortness of breath.  She and she is less miserable.  However CT scan and pulmonary function tests show slight decline in lung function with  no fibrosis as documented below.  The CT scan was personally visualized by me.  She continues to have some cough for which she wants [employer]  We discussed nintedanib as an alternative option (see below().  We also discussed research as a care option but her low DLCO makes it difficult for her to qualify for IV or inhaler related studies that do not have GI side effects.   - OFEV  - - time to first exacerbation possibly reduced in one trial but not in another - twice daily, no titration, potentially more convenient dosing  - no need for sunscreen  - high chance of mild diarrhea but low chance of significant diarrhea needing to stop medication.   - Rx diarrhea with lomotil - slight increase in heart attack risk and theoretical increase in bleeding risk,   - need monthly blood work for 3 months and then every 6 months - monitor liver function       Results for AKESHA, URESTI (MRN [id number]) as of 06/24/2018 09:25  Ref. Range 12/29/2017 12:43 06/16/2018 10:33  FVC-Pre Latest Units: L 1.49 1.54  FVC-%Pred-Pre Latest Units: % 58 60  Results for Soileau, LENER VENTRESCA (MRN [id number]) as of 06/24/2018 09:25  Ref. Range 12/29/2017 12:43 06/16/2018 10:33  DLCO unc Latest Units: ml/min/mmHg 7.82 6.02  DLCO unc % pred Latest Units: % 30 23   Results for Hanton, ROGER FASNACHT (MRN [id number]) as of 06/24/2018 09:25  Ref. Range 04/27/2018 15:06  Hemoglobin Latest Ref Range: 12.0 - 15.0 g/dL 13.6   HRCT 06/15/18 Lungs/Pleura: High-resolution images again demonstrate widespread areas of septal thickening, subpleural reticulation, thickening of the peribronchovascular interstitium, traction bronchiectasis and frank honeycombing. Very mild craniocaudal gradient. Findings appear slightly progressive compared to the prior study from 11/13/2017. No confluent consolidative airspace disease. No pleural effusions. Inspiratory and expiratory imaging demonstrates some mild to moderate air trapping indicative of small airways disease. No definite suspicious appearing pulmonary nodules or masses are noted. IMPRESSION: 1. Previously noted left Valente lobe nodule has completely resolved, indicative of an infectious or inflammatory etiology on the prior study. 2. Chronic changes of interstitial lung disease again considered probable usual interstitial pneumonia (UIP) per ATS guidelines. 3. Aortic atherosclerosis, in addition to left main and 3 vessel coronary artery disease. Please note that although the presence of coronary artery calcium documents the presence of coronary artery disease, the severity of this disease and any potential stenosis cannot be assessed on this non-gated CT examination. Assessment for potential risk factor modification, dietary therapy or pharmacologic therapy may be warranted, if clinically indicated.  Aortic Atherosclerosis (ICD10-I70.0).   Electronically  Signed   By: Vinnie Langton M.D.   On: 06/15/2018 17:00    08/05/2018  - Visit   85 year old female patient completing follow-up with our office today. Pt is currently on OFEV and patient is doing well.  Patient attributes her success on the medication to to the nursing education from the pharmaceutical company.  Patient reports she has had occasional diarrhea but this is been managed well with Lomotil.  Patient feels much better on this medication than Esbriet.  Patient denies drops in oxygen and feels that she is able to do more things around the house.  Patient thinks the medication is going well.  Patient is interested in traveling to [place] as she usually does this every year.  Patient is wondering how to coordinate this with her oxygen needs now.  Patient is wondering if she could switch her  DME company from [facility] to [employer].   Records are showing that patient is due for pneumonia vaccine.  Patient believes she has received [facility] at [facility] she will check her records as well as at [facility] in bring to next office visit.    06/15/18 -CT chest high-res- previously noted left Lonardo lobe nodules completely resolved, chronic changes of interstitial lung disease again considered probable UIP   OV 09/16/2018  Subjective:  Patient ID: Cheryll Cockayne, female , DOB: [birth year] , age 76 y.o. , MRN: [id number] , ADDRESS: [place] [phone]   09/16/2018 -   Chief Complaint  Patient presents with   Follow-up    Pt states she had been doing good until 4 days ago and stated she began throwing up on Sunday and also threw up Monday night as well as Tuesday and also had diarrhea. Pt states SOB is about the same as last visit.   IPF with chronic hypoxemic respiratory failure 2 L at rest.  Failed Esbriet due to GI intolerance.  Started on nintedanib early 2019 early November  HPI Vivian A Holthaus 85 y.o. -after starting nintedanib she saw a nurse practitioner mid  December 2019.  At that time she was tolerating it well.  Liver function test was fine.  She now reports for follow-up she tells me that she has new onset of diarrhea since starting nintedanib.  This happens around 2 times a week.  It happens abruptly without any warning and she wets her bed or with her self.  Otherwise the severity itself is only mild to moderate.  But the main issue is that it happens abruptly.  She responds by taking Imodium and then she does not have diarrhea for a few days till it recurs again abruptly.  In addition for the last few days she has had intermittent nausea and vomiting.  This was not an issue with nintedanib until now.  It was a big issue with Esbriet along with weight loss which made her stop the Esbriet.  She has not tried any Zofran for this.  Otherwise in terms of her IPF she feels clinically stable.  She is bothered a lot by chronic pain in her joints.  She will talk to her primary care physician about this.   OV 06/20/2019  Subjective:  Patient ID: Cheryll Cockayne, female , DOB: 1930-11-29 , age 8 y.o. , MRN: [id number] , ADDRESS: [place] [phone]   06/20/2019 -   Chief Complaint  Patient presents with   Follow-up    Patient reports that she is doing well at this time.    IPF with chronic hypoxemic respiratory failure 2 L at rest.  Failed Esbriet due to GI intolerance.  Started on nintedanib early 2019 early November and stopped SEpt 2012  HPI Trannie A Kegel 85 y.o. -she has had at least 2 documented options to nintedanib  in April 2020 and currentl since September 2020.  There is possibly a third interaction that happened earlier in March 2020 but she does not reembmer  Most recently saw a nurse practitioner May 30, 2019 at which time she was off her nintedanib because of diarrhea.  She reported to the nurse practitioner that after stopping the nintedanib the diarrhea had improved and her fatigue improved and her appetite also  improved.  Overall quality of life is much better after stopping antifibrotic's.  And finally she started having weight gain as well.  The nintedanib has been stopped at that point till September 2020. On May 31, 2019 she underwent colonoscopy by Dr. Trinda Pascal [this was 1 day after seeing our nurse practitioner in the office] this is because of rectal bleeding.  She was noticed to have a tortuous colon and diverticulosis in the rectosigmoid colon and the transverse colon and ascending colon.  The rectal bleeding was thought secondary to internal hemorrhoids.  No colonic source of weight loss was discovered.  She was recommended high-fiber diet.  And return to the office in 6 months.  At this point in time she tells me that she does not want to go back on Myfortic pirfenidone or nintedanib.  Her weight has been.  She is feeling better.  She says she lost 15 pounds of weight.  She is interested in IV infusion therapy through research protocol if offered for her IPF.  She uses 2 L of oxygen at rest and exertion.  Occasionally bumped it up to 3 L. Walking desat test shows stability x 1 y ear  Last pulmonary function test was October 2019 Last high-resolution CT chest was October 2019   SYMPTOM SCALE - ILD 06/20/2019   O2 use 2-3L  Shortness of Breath 0 -> 5 scale with 5 being worst (score 6 If unable to do)  At rest 0  Simple tasks - showers, clothes change, eating, shaving 0  Household (dishes, doing bed, laundry) 0  Shopping 3  Walking level at own pace 0  Walking keeping up with others of same age 73  Walking up Stairs na  Walking up [employer]  Total (40 - 48) Dyspnea Score x  How bad is your cough? 0  How bad is your fatigue 2        Simple office walk 185 feet x  3 laps goal with forehead probe 02/02/2018 Start esbriet 03/15/2018 contnue esbriet 04/27/2018 Stop esbriet 05/26/2018  06/20/2019    O2 used Room air Room air Room air  room air Room air  Number laps completed _0 but  desat at 2nd lap  desaturated at first lap deagurated at first lap  Comments about pace nrmal pacte Normal pace Slow pace  slow pace Slow pace  Resting Pulse Ox/HR 98% and 83/min 98% and HR 85/min 96% and HR 63/min  98% and 88/min 98% and 70/min  Final Pulse Ox/HR 91% and 98/min 87% and HR 91% at end of 3rd lao 87% and HR 79 at end of 2nd lap  86% and heart rate of 95 at first lab 87% and HR 84/in  Desaturated </= 88% no yes Yes -  yes   Desaturated <= 3% points yes yes yes  yes   Got Tachycardic >/= 90/min yes yes no  yes   Symptoms at end of test dyspnea Mild dyspnea x  dyspnea   Miscellaneous comments Normal pace Normal pace Worse desats  worsening desaturation.  Corrected with 3 laps of walking 2 L similr to before    ROS - per HPI     has a past medical history of Arthritis, Depression, GERD (gastroesophageal reflux disease), Hypercholesterolemia, Hypertension, ILD (interstitial lung disease) ([place]), Psoriasis, Seizure ([place]), and Seizures ([place]).   reports that she quit smoking about 46 years ago. Her smoking use included cigarettes. She has a 60.00 pack-year smoking history. She has never used smokeless tobacco.  Past Surgical History:  Procedure Laterality Date   BIOPSY  04/20/2018   Procedure: BIOPSY;  Surgeon: Oneida Alar,  Marga Melnick, MD;  [facility];  Service: Endoscopy;;  gastric   COLONOSCOPY  01/2011   Dr. Anthony Sar: normal.    COLONOSCOPY WITH PROPOFOL N/A 05/31/2019   Procedure: COLONOSCOPY WITH PROPOFOL;  Surgeon: Danie Binder, MD;  [facility];  Service: Endoscopy;  Laterality: N/A;  10:30am   DILATION AND CURETTAGE OF UTERUS     x3   ESOPHAGOGASTRODUODENOSCOPY (EGD) WITH PROPOFOL N/A 04/20/2018   benign-appearing, intrinsic mild stenosis, s/p dilation. Mild inflammation entire stomach, s/p biopsy that was negative for H.pylori. Pylorus normal but had changes consistent with PRIOR ulcer disease. Duodenum normal.    ESOPHAGOGASTRODUODENOSCOPY (EGD)  WITH PROPOFOL N/A 05/31/2019   Procedure: ESOPHAGOGASTRODUODENOSCOPY (EGD) WITH PROPOFOL;  Surgeon: Danie Binder, MD;  [facility];  Service: Endoscopy;  Laterality: N/A;   SAVORY DILATION N/A 04/20/2018   Procedure: SAVORY DILATION;  Surgeon: Danie Binder, MD;  [facility];  Service: Endoscopy;  Laterality: N/A;   VIDEO BRONCHOSCOPY Bilateral 01/07/2018   Procedure: VIDEO BRONCHOSCOPY WITHOUT FLUORO;  Surgeon: Brand Males, MD;  [facility];  Service: Cardiopulmonary;  Laterality: Bilateral;   WRIST SURGERY Right    ORIF    Allergies  Allergen Reactions   Humira [Adalimumab] Other (See Comments)    Worsened your psoriasis.   Pirfenidone Other (See Comments)    Fatigue and weight loss    Immunization History  Administered Date(s) Administered   Fluad Quad(high Dose 65+) 05/10/2019   Influenza, High Dose Seasonal PF 06/10/2017, 04/27/2018   Pneumococcal Conjugate-13 12/09/2014, 09/05/2018    Family History  Problem Relation Age of Onset   Heart disease Mother    Heart disease Father    Cancer Sister        GYN   Colon cancer Neg Hx      Current Outpatient Medications:    atenolol (TENORMIN) 25 MG tablet, Take 25 mg by mouth 2 (two) times daily. , Disp: , Rfl:    cholecalciferol (VITAMIN D3) 25 MCG (1000 UT) tablet, Take 1,000 Units by mouth daily., Disp: , Rfl:    hydrocortisone (ANUSOL-HC) 2.5 % rectal cream, Place 1 application rectally 2 (two) times daily. (Patient taking differently: Place 1 application rectally 2 (two) times daily as needed for hemorrhoids. ), Disp: 30 g, Rfl: 1   lactobacillus acidophilus (BACID) TABS tablet, Take 1 tablet by mouth daily., Disp: , Rfl:    mirtazapine (REMERON) 7.5 MG tablet, Take 7.5 mg by mouth at bedtime., Disp: , Rfl:    potassium chloride (K-DUR) 10 MEQ tablet, Take 10 mEq by mouth daily. , Disp: , Rfl:    simvastatin (ZOCOR) 20 MG tablet, Take 20 mg by mouth at bedtime.  , Disp: , Rfl:    vitamin B-12 (CYANOCOBALAMIN) 1000 MCG tablet, Take 1,000 mcg by mouth daily. , Disp: , Rfl:       Objective:   Vitals:   06/20/19 1116  BP: 108/60  Pulse: 70  Temp: (!) 97.4 F (36.3 C)  TempSrc: Temporal  SpO2: 100%  Weight: 56.7 kg  Height: _0  (1.651 m)    Estimated body mass index is 20.8 kg/m as calculated from the following:   Height as of this encounter: _1  (1.651 m).   Weight as of this encounter: 56.7 kg.  _2 @  Filed Weights   06/20/19 1116  Weight: 56.7 kg     Physical Exam  General Appearance:    Alert, cooperative, no distress, appears stated age -  yes , Deconditioned looking - yes , OBESE  - no . frail, Sitting on Wheelchair -  no  Head:    Normocephalic, without obvious abnormality, atraumatic  Eyes:    PERRL, conjunctiva/corneas clear,  Ears:    Normal TM's and external ear canals, both ears  Nose:   Nares normal, septum midline, mucosa normal, no drainage    or sinus tenderness. OXYGEN ON  - yes . Patient is @ 2L at rest baseline but we walked her on RA for test   Throat:   Lips, mucosa, and tongue normal; teeth and gums normal. Cyanosis on lips - no  Neck:   Supple, symmetrical, trachea midline, no adenopathy;    thyroid:  no enlargement/tenderness/nodules; no carotid   bruit or JVD  Back:     Symmetric, no curvature, ROM normal, no CVA tenderness  Lungs:     Distress - no , Wheeze no, Barrell Chest - no, Purse lip breathing - no, Crackles - YUES   Chest Wall:    No tenderness or deformity.    Heart:    Regular rate and rhythm, S1 and S2 normal, no rub   or gallop, Murmur - no  Breast Exam:    NOT DONE  Abdomen:     Soft, non-tender, bowel sounds active all four quadrants,    no masses, no organomegaly. Visceral obesity - no  Genitalia:   NOT DONE  Rectal:   NOT DONE  Extremities:   Extremities - normal, Has Cane - no, Clubbing - YES, Edema - no  Pulses:   2+ and symmetric all extremities  Skin:   Stigmata of  Connective Tissue Disease - no  Lymph nodes:   Cervical, supraclavicular, and axillary nodes normal  Psychiatric:  Neurologic:   Pleasant - yes, Anxious - no, Flat affect - no  CAm-ICU - neg, Alert and Oriented x 3 - yes, Moves all 4s - yes, Speech - normal, Cognition - intact           Assessment:       ICD-10-CM   1. IPF (idiopathic pulmonary fibrosis) ([place])  J84.112   2. Chronic respiratory failure with hypoxia (HCC)  J96.11        Plan:     Patient Instructions     ICD-10-CM   1. IPF (idiopathic pulmonary fibrosis) ([place])  J84.112   2. Chronic respiratory failure with hypoxia (HCC)  J96.11     Clinically stable Significant intolerance to both pirfenidone and nintedanib Glad you are doing better without pirfenidone and nitedanib  Plan  - continue o2 2-3L [place] as before  - glad you are interested in IV research protocol for IPF - do spirometry and dlco in 3 months - can do at [facility]  - do HRCT in 3 months - can do at [place]  Followup  - 3 months or sooner - 30 min slot ILD clinic but after above  - can discuss research options at followup   > 50% of this > 25 min visit spent in face to face counseling or coordination of care - by this undersigned MD - Dr Brand Males. This includes one or more of the following documented above: discussion of test results, diagnostic or treatment recommendations, prognosis, risks and benefits of management options, instructions, education, compliance or risk-factor reduction   SIGNATURE    Dr. Brand Males, M.D., F.C.C.P,  Pulmonary and Critical Care Medicine Staff Physician, [facility] Director - Interstitial Lung Disease  Program  [place] at [place], [place], [address]  Pager: [phone], If no answer or between  15:00h - 7:00h: call [phone] Telephone: [phone]  12:13 PM 06/20/2019

## 2019-06-21 ENCOUNTER — Ambulatory Visit: Payer: Medicare HMO | Admitting: Urology

## 2019-07-07 ENCOUNTER — Other Ambulatory Visit (HOSPITAL_COMMUNITY): Payer: Medicare HMO

## 2019-07-08 DIAGNOSIS — R0902 Hypoxemia: Secondary | ICD-10-CM | POA: Diagnosis not present

## 2019-07-08 DIAGNOSIS — M1611 Unilateral primary osteoarthritis, right hip: Secondary | ICD-10-CM | POA: Diagnosis not present

## 2019-07-08 DIAGNOSIS — Z96649 Presence of unspecified artificial hip joint: Secondary | ICD-10-CM | POA: Diagnosis not present

## 2019-07-08 DIAGNOSIS — J849 Interstitial pulmonary disease, unspecified: Secondary | ICD-10-CM | POA: Diagnosis not present

## 2019-07-11 ENCOUNTER — Encounter (HOSPITAL_COMMUNITY): Payer: Self-pay

## 2019-07-11 ENCOUNTER — Ambulatory Visit (HOSPITAL_COMMUNITY): Admit: 2019-07-11 | Payer: Medicare HMO | Admitting: Gastroenterology

## 2019-07-11 SURGERY — COLONOSCOPY WITH PROPOFOL
Anesthesia: Monitor Anesthesia Care

## 2019-07-22 DIAGNOSIS — E785 Hyperlipidemia, unspecified: Secondary | ICD-10-CM | POA: Diagnosis not present

## 2019-07-22 DIAGNOSIS — R1110 Vomiting, unspecified: Secondary | ICD-10-CM | POA: Diagnosis not present

## 2019-07-22 DIAGNOSIS — Z299 Encounter for prophylactic measures, unspecified: Secondary | ICD-10-CM | POA: Diagnosis not present

## 2019-07-22 DIAGNOSIS — Z87891 Personal history of nicotine dependence: Secondary | ICD-10-CM | POA: Diagnosis not present

## 2019-07-22 DIAGNOSIS — Z6820 Body mass index (BMI) 20.0-20.9, adult: Secondary | ICD-10-CM | POA: Diagnosis not present

## 2019-07-22 DIAGNOSIS — I10 Essential (primary) hypertension: Secondary | ICD-10-CM | POA: Diagnosis not present

## 2019-08-07 DIAGNOSIS — R0902 Hypoxemia: Secondary | ICD-10-CM | POA: Diagnosis not present

## 2019-08-07 DIAGNOSIS — Z96649 Presence of unspecified artificial hip joint: Secondary | ICD-10-CM | POA: Diagnosis not present

## 2019-08-07 DIAGNOSIS — M1611 Unilateral primary osteoarthritis, right hip: Secondary | ICD-10-CM | POA: Diagnosis not present

## 2019-08-07 DIAGNOSIS — J849 Interstitial pulmonary disease, unspecified: Secondary | ICD-10-CM | POA: Diagnosis not present

## 2019-08-15 ENCOUNTER — Telehealth: Payer: Self-pay | Admitting: Internal Medicine

## 2019-08-15 NOTE — Telephone Encounter (Signed)
Ct is already scheduled I just have not called the patient yet but front can make the pft if they are calling in

## 2019-08-16 ENCOUNTER — Telehealth: Payer: Self-pay | Admitting: Internal Medicine

## 2019-08-16 DIAGNOSIS — J84112 Idiopathic pulmonary fibrosis: Secondary | ICD-10-CM

## 2019-08-16 NOTE — Telephone Encounter (Signed)
lmtcb for pt.  

## 2019-08-17 NOTE — Telephone Encounter (Signed)
SPoke with pt and she is wanting to know more information about the IV research protocol/ MR please advise.    Patient Instructions by Brand Males, MD at 06/20/2019 11:00 AM Author: Brand Males, MD Author Type: Physician Filed: 06/20/2019 12:13 PM  Note Status: Signed Cosign: Cosign Not Required Encounter Date: 06/20/2019  Editor: Brand Males, MD (Physician)      ICD-10-CM   1. IPF (idiopathic pulmonary fibrosis) ([place])  J84.112   2. Chronic respiratory failure with hypoxia (HCC)  J96.11     Clinically stable Significant intolerance to both pirfenidone and nintedanib Glad you are doing better without pirfenidone and nitedanib  Plan  - continue o2 2-3L  as before  - glad you are interested in IV research protocol for IPF - do spirometry and dlco in 3 months - can do at [facility]  - do HRCT in 3 months - can do at [facility]  Followup  - 3 months or sooner - 30 min slot ILD clinic but after above             - can discuss research options at followup

## 2019-08-17 NOTE — Telephone Encounter (Signed)
Checked schedule and the patient to have PFT 09/01/19. Nothing further needed at this time.

## 2019-08-18 NOTE — Telephone Encounter (Signed)
MR will be in clinic 12/28 so this can be addressed then.

## 2019-08-23 NOTE — Telephone Encounter (Signed)
Called pt and advised message from the provider. Pt understood and verbalized understanding. Nothing further is needed.   CBC ordered.

## 2019-08-23 NOTE — Telephone Encounter (Signed)
Some studies exclude her due to age > 63 or > 85  Right now also, all studies exclude her becaue her dlco is too low (from oct 2019) for her to qualify for any study. Some of this could be from anemia (hgb 11gm% oct 2020) but mostly from IPF.   Plan  - go thrugh spiro/dlco testing in Jan 2021 (on schedule)  - get CBC on same day she goes to [facility] for spiro  - keep appt for Sep 07, 2019 HRCT - PLEASE MAKE FOLLOWUP to see me - 30 min slot (ILD clinic or any day) after that  - if dlco > 30% then we can look at an inhaler study but not infusion study (gi side effects low)

## 2019-08-29 ENCOUNTER — Other Ambulatory Visit: Payer: Self-pay

## 2019-08-29 ENCOUNTER — Other Ambulatory Visit (HOSPITAL_COMMUNITY)
Admission: RE | Admit: 2019-08-29 | Discharge: 2019-08-29 | Disposition: A | Discharge status: Home / Self Care | Payer: Medicare PPO | Source: Ambulatory Visit | Attending: Internal Medicine | Admitting: Internal Medicine

## 2019-08-29 DIAGNOSIS — Z01812 Encounter for preprocedural laboratory examination: Secondary | ICD-10-CM | POA: Insufficient documentation

## 2019-08-29 DIAGNOSIS — Z20822 Contact with and (suspected) exposure to covid-19: Secondary | ICD-10-CM | POA: Insufficient documentation

## 2019-08-30 LAB — SARS CORONAVIRUS 2 (TAT 6-24 HRS): SARS Coronavirus 2: NEGATIVE

## 2019-09-01 ENCOUNTER — Other Ambulatory Visit: Payer: Self-pay

## 2019-09-01 ENCOUNTER — Ambulatory Visit (HOSPITAL_COMMUNITY)
Admission: RE | Admit: 2019-09-01 | Discharge: 2019-09-01 | Disposition: A | Discharge status: Home / Self Care | Payer: Medicare PPO | Source: Ambulatory Visit | Attending: Internal Medicine | Admitting: Internal Medicine

## 2019-09-01 DIAGNOSIS — J84112 Idiopathic pulmonary fibrosis: Secondary | ICD-10-CM | POA: Diagnosis not present

## 2019-09-01 LAB — PULMONARY FUNCTION TEST
DL/VA % pred: 48 %
DL/VA: 1.95 ml/min/mmHg/L
DLCO unc % pred: 27 %
DLCO unc: 5.15 ml/min/mmHg
FEF 25-75 Pre: 1.2 L/sec
FEF2575-%Pred-Pre: 103 %
FEV1-%Pred-Pre: 74 %
FEV1-Pre: 1.35 L
FEV1FVC-%Pred-Pre: 109 %
FEV6-%Pred-Pre: 73 %
FEV6-Pre: 1.7 L
FEV6FVC-%Pred-Pre: 106 %
FVC-%Pred-Pre: 69 %
FVC-Pre: 1.7 L
Pre FEV1/FVC ratio: 79 %
Pre FEV6/FVC Ratio: 100 %

## 2019-09-05 ENCOUNTER — Telehealth: Payer: Self-pay | Admitting: Internal Medicine

## 2019-09-05 DIAGNOSIS — J84112 Idiopathic pulmonary fibrosis: Secondary | ICD-10-CM

## 2019-09-05 NOTE — Telephone Encounter (Signed)
I called and spoke with the patient and asked her where she went to have her labs drawn and she said [place] at a [facility] facility. I advised her that the nurse she spoke with was not aware that's she would be going to a different facility and the order was placed to be done here, so this is the reason they could not see it. She states that she's going to have her CT scan on Wednesday the 13th so I advised her that I will place the order again so that they will be able to see this one.

## 2019-09-07 ENCOUNTER — Other Ambulatory Visit (HOSPITAL_COMMUNITY)
Admission: RE | Admit: 2019-09-07 | Discharge: 2019-09-07 | Disposition: A | Discharge status: Home / Self Care | Payer: Medicare PPO | Source: Ambulatory Visit | Attending: Internal Medicine | Admitting: Internal Medicine

## 2019-09-07 ENCOUNTER — Other Ambulatory Visit: Payer: Self-pay

## 2019-09-07 ENCOUNTER — Ambulatory Visit (HOSPITAL_COMMUNITY)
Admission: RE | Admit: 2019-09-07 | Discharge: 2019-09-07 | Disposition: A | Discharge status: Home / Self Care | Payer: Medicare PPO | Source: Ambulatory Visit | Attending: Internal Medicine | Admitting: Internal Medicine

## 2019-09-07 DIAGNOSIS — R0902 Hypoxemia: Secondary | ICD-10-CM | POA: Diagnosis not present

## 2019-09-07 DIAGNOSIS — J84112 Idiopathic pulmonary fibrosis: Secondary | ICD-10-CM | POA: Diagnosis not present

## 2019-09-07 DIAGNOSIS — M1611 Unilateral primary osteoarthritis, right hip: Secondary | ICD-10-CM | POA: Diagnosis not present

## 2019-09-07 DIAGNOSIS — Z96649 Presence of unspecified artificial hip joint: Secondary | ICD-10-CM | POA: Diagnosis not present

## 2019-09-07 DIAGNOSIS — J849 Interstitial pulmonary disease, unspecified: Secondary | ICD-10-CM | POA: Diagnosis not present

## 2019-09-07 LAB — CBC WITH DIFFERENTIAL/PLATELET
Abs Immature Granulocytes: 0.05 10*3/uL (ref 0.00–0.07)
Basophils Absolute: 0.1 10*3/uL (ref 0.0–0.1)
Basophils Relative: 1 %
Eosinophils Absolute: 0.2 10*3/uL (ref 0.0–0.5)
Eosinophils Relative: 2 %
HCT: 36.3 % (ref 36.0–46.0)
Hemoglobin: 11.6 g/dL — ABNORMAL LOW (ref 12.0–15.0)
Immature Granulocytes: 1 %
Lymphocytes Relative: 27 %
Lymphs Abs: 2 10*3/uL (ref 0.7–4.0)
MCH: 35.9 pg — ABNORMAL HIGH (ref 26.0–34.0)
MCHC: 32 g/dL (ref 30.0–36.0)
MCV: 112.4 fL — ABNORMAL HIGH (ref 80.0–100.0)
Monocytes Absolute: 0.7 10*3/uL (ref 0.1–1.0)
Monocytes Relative: 9 %
Neutro Abs: 4.7 10*3/uL (ref 1.7–7.7)
Neutrophils Relative %: 60 %
Platelets: 285 10*3/uL (ref 150–400)
RBC: 3.23 MIL/uL — ABNORMAL LOW (ref 3.87–5.11)
RDW: 13.2 % (ref 11.5–15.5)
WBC: 7.6 10*3/uL (ref 4.0–10.5)
nRBC: 0 % (ref 0.0–0.2)

## 2019-09-14 DIAGNOSIS — M542 Cervicalgia: Secondary | ICD-10-CM | POA: Diagnosis not present

## 2019-09-14 DIAGNOSIS — I10 Essential (primary) hypertension: Secondary | ICD-10-CM | POA: Diagnosis not present

## 2019-09-14 DIAGNOSIS — Z299 Encounter for prophylactic measures, unspecified: Secondary | ICD-10-CM | POA: Diagnosis not present

## 2019-09-14 DIAGNOSIS — J8410 Pulmonary fibrosis, unspecified: Secondary | ICD-10-CM | POA: Diagnosis not present

## 2019-09-14 DIAGNOSIS — N183 Chronic kidney disease, stage 3 unspecified: Secondary | ICD-10-CM | POA: Diagnosis not present

## 2019-09-14 DIAGNOSIS — Z6822 Body mass index (BMI) 22.0-22.9, adult: Secondary | ICD-10-CM | POA: Diagnosis not present

## 2019-09-19 ENCOUNTER — Telehealth: Payer: Self-pay | Admitting: Internal Medicine

## 2019-09-19 DIAGNOSIS — I2510 Atherosclerotic heart disease of native coronary artery without angina pectoris: Secondary | ICD-10-CM

## 2019-09-19 DIAGNOSIS — I288 Other diseases of pulmonary vessels: Secondary | ICD-10-CM

## 2019-09-19 NOTE — Telephone Encounter (Signed)
Ashlee Martin - let her know CT chest shows fibrosis but no cancer . Also has calcium deposit in heart blood vessel.   Plan Does she have a cardiologist? - let me know response  15 min telephone visit - first avail with me to go over things

## 2019-09-20 NOTE — Telephone Encounter (Signed)
Referral to cardiology was placed. Called and spoke with pt letting her know that this was being done and she verbalized understanding. Pt has also been scheduled a televisit with MR 2/4. Nothing further needed.

## 2019-09-20 NOTE — Telephone Encounter (Signed)
She does not qualify for any trials (as yet). All IV therapies are trials. This is because of age > 20/85 or test is too severe. We will keep looking. She might might qualify for an inhaler study  Meanwhile, she has Coronary artery calcificaiton and enlarged Pulm artery on CT -> refer to cardiology for possible stress test and right heart cath

## 2019-09-20 NOTE — Telephone Encounter (Signed)
Called and spoke with pt letting her know the info stated by MR in regards to results of the CT. Asked pt if she has a cardiologist that she sees and pt stated that she does not.  While speaking with pt, pt wanted to know if she was going to be started on the IV therapy for her fibrosis.

## 2019-09-27 ENCOUNTER — Ambulatory Visit (INDEPENDENT_AMBULATORY_CARE_PROVIDER_SITE_OTHER): Payer: Medicare PPO | Admitting: Cardiology

## 2019-09-27 ENCOUNTER — Encounter: Payer: Self-pay | Admitting: Cardiology

## 2019-09-27 ENCOUNTER — Other Ambulatory Visit: Payer: Self-pay

## 2019-09-27 VITALS — BP 112/68 | HR 83 | Ht 65.0 in | Wt 140.0 lb

## 2019-09-27 DIAGNOSIS — J84112 Idiopathic pulmonary fibrosis: Secondary | ICD-10-CM | POA: Diagnosis not present

## 2019-09-27 DIAGNOSIS — I2729 Other secondary pulmonary hypertension: Secondary | ICD-10-CM | POA: Diagnosis not present

## 2019-09-27 DIAGNOSIS — R0602 Shortness of breath: Secondary | ICD-10-CM

## 2019-09-27 DIAGNOSIS — I2510 Atherosclerotic heart disease of native coronary artery without angina pectoris: Secondary | ICD-10-CM

## 2019-09-27 MED ORDER — ASPIRIN EC 81 MG PO TBEC: 81.0000 mg | DELAYED_RELEASE_TABLET | Freq: Every day | ORAL | 3 refills | Status: DC

## 2019-09-27 NOTE — Progress Notes (Signed)
Cardiology Office Note  Date: 09/27/2019   ID: Ashlee Martin, DOB 16-Jan-1931, MRN [id number]  PCP:  Glenda Chroman, MD  Consulting Cardiologist:  Rozann Lesches, MD Electrophysiologist:  None   Chief Complaint  Patient presents with  . Coronary artery calcification    History of Present Illness: Ashlee Martin is an 86 y.o. female referred for cardiology consultation by Dr. Chase Caller due to findings of coronary artery calcifications and enlarged pulmonary trunk by chest CT imaging.  History includes chronic hypoxic respiratory failure with idiopathic pulmonary fibrosis (intolerant of pirfenidone and nintedanib).  She is here today with her husband.  She reports recently stable NYHA class III dyspnea on exertion, uses oxygen continuously.  Of note, she parenthetically tells me that last night she became separated from her oxygen and awoke to find her oxygen saturation at 79%.  She is able to do basic ADLs around the house.  She and her husband also walk slowly on their driveway, about 338 feet.  She does not report any chronic cough or hemoptysis.  No obvious orthopnea or PND.  No leg swelling.  Recent chest CT results are outlined below.  Coronary atherosclerosis is not a new finding and dates back to previous studies in 2019.  I discussed this with her and discussed the implications.  She is already taking simvastatin, has not been on aspirin.  Her ECG today is normal.  Past Medical History:  Diagnosis Date  . Arthritis   . Coronary artery calcification seen on CT scan   . Depression   . Essential hypertension   . GERD (gastroesophageal reflux disease)   . History of seizures   . Hypercholesterolemia   . ILD (interstitial lung disease) ([place])   . Psoriasis    Had been on Humira but continued to have UTIs, sinus infections, etc. Taken off.    Past Surgical History:  Procedure Laterality Date  . BIOPSY  04/20/2018   Procedure: BIOPSY;  Surgeon: Danie Binder, MD;  [facility];  Service: Endoscopy;;  gastric  . COLONOSCOPY  01/2011   Dr. Anthony Sar: normal.   . COLONOSCOPY WITH PROPOFOL N/A 05/31/2019   Procedure: COLONOSCOPY WITH PROPOFOL;  Surgeon: Danie Binder, MD;  [facility];  Service: Endoscopy;  Laterality: N/A;  10:30am  . DILATION AND CURETTAGE OF UTERUS     x3  . ESOPHAGOGASTRODUODENOSCOPY (EGD) WITH PROPOFOL N/A 04/20/2018   benign-appearing, intrinsic mild stenosis, s/p dilation. Mild inflammation entire stomach, s/p biopsy that was negative for H.pylori. Pylorus normal but had changes consistent with PRIOR ulcer disease. Duodenum normal.   . ESOPHAGOGASTRODUODENOSCOPY (EGD) WITH PROPOFOL N/A 05/31/2019   Procedure: ESOPHAGOGASTRODUODENOSCOPY (EGD) WITH PROPOFOL;  Surgeon: Danie Binder, MD;  [facility];  Service: Endoscopy;  Laterality: N/A;  . SAVORY DILATION N/A 04/20/2018   Procedure: SAVORY DILATION;  Surgeon: Danie Binder, MD;  [facility];  Service: Endoscopy;  Laterality: N/A;  . VIDEO BRONCHOSCOPY Bilateral 01/07/2018   Procedure: VIDEO BRONCHOSCOPY WITHOUT FLUORO;  Surgeon: Brand Males, MD;  [facility];  Service: Cardiopulmonary;  Laterality: Bilateral;  . WRIST SURGERY Right    ORIF    Current Outpatient Medications  Medication Sig Dispense Refill  . atenolol (TENORMIN) 25 MG tablet Take 25 mg by mouth 2 (two) times daily.     Marland Kitchen BIOTIN PO Take by mouth.    . cholecalciferol (VITAMIN D3) 25 MCG (1000 UT) tablet Take 1,000 Units by  mouth daily.    . fexofenadine (ALLEGRA) 180 MG tablet Take 180 mg by mouth 2 (two) times daily.    . hydrocortisone (ANUSOL-HC) 2.5 % rectal cream Place 1 application rectally 2 (two) times daily. (Patient taking differently: Place 1 application rectally 2 (two) times daily as needed for hemorrhoids. ) 30 g 1  . lactobacillus acidophilus (BACID) TABS tablet Take 1 tablet by mouth daily.    . mirtazapine (REMERON) 7.5 MG tablet Take 7.5 mg by  mouth at bedtime.    . potassium chloride (K-DUR) 10 MEQ tablet Take 10 mEq by mouth daily.     . simvastatin (ZOCOR) 20 MG tablet Take 20 mg by mouth at bedtime.     . vitamin B-12 (CYANOCOBALAMIN) 1000 MCG tablet Take 1,000 mcg by mouth daily.     Marland Kitchen aspirin EC 81 MG tablet Take 1 tablet (81 mg total) by mouth daily. 90 tablet 3   No current facility-administered medications for this visit.   Allergies:  Humira [adalimumab] and Pirfenidone   Social History: The patient  reports that she quit smoking about 46 years ago. Her smoking use included cigarettes. She has a 60.00 pack-year smoking history. She has never used smokeless tobacco. She reports current alcohol use of about 7.0 standard drinks of alcohol per week. She reports that she does not use drugs.   Family History: The patient's family history includes Cancer in her sister; Heart disease in her father and mother.   ROS:  Please see the history of present illness. Otherwise, complete review of systems is positive for knee pain.  All other systems are reviewed and negative.   Physical Exam: VS:  BP 112/68   Pulse 83   Ht _0  (1.651 m)   Wt 140 lb (63.5 kg)   SpO2 92%   BMI 23.30 kg/m , BMI Body mass index is 23.3 kg/m.  Wt Readings from Last 3 Encounters:  09/27/19 140 lb (63.5 kg)  06/20/19 125 lb (56.7 kg)  05/31/19 122 lb 9.2 oz (55.6 kg)    General: Elderly woman in no distress, wearing oxygen via nasal cannula. HEENT: Conjunctiva and lids normal, wearing a mask. Neck: Supple, no elevated JVP or carotid bruits, no thyromegaly. Lungs: Coarse breath sounds with "dry" crackles, nonlabored breathing at rest. Cardiac: Regular rate and rhythm, no S3 or significant systolic murmur, no pericardial rub.  Abdomen: Soft, nontender, bowel sounds present. Extremities: No pitting edema, distal pulses 2+. Skin: Warm and dry. Musculoskeletal: Mild kyphosis. Neuropsychiatric: Alert and oriented x3, affect grossly  appropriate.  ECG:  An ECG dated 04/19/2018 was personally reviewed today and demonstrated:  Sinus rhythm with PAC and nonspecific T wave changes.  Recent Labwork: 05/27/2019: BUN 12; Creatinine, Ser 1.03; Potassium 4.5; Sodium 135 09/07/2019: Hemoglobin 11.6; Platelets 285   Other Studies Reviewed Today:  Chest CT 09/07/2019: FINDINGS: Cardiovascular: Atherosclerotic calcification of the aorta, aortic valve and coronary arteries. Distal aortic arch measures 3.0 cm. Pulmonic trunk is enlarged. Heart is at the Jaber limits of normal in size to mildly enlarged. No pericardial effusion.  Mediastinum/Nodes: No pathologically enlarged mediastinal or axillary lymph nodes. Hilar regions are difficult to definitively evaluate without IV contrast. Esophagus is grossly unremarkable.  Lungs/Pleura: Peripheral interstitial coarsening, traction bronchiectasis/bronchiolectasis, subpleural reticulation and honeycombing, similar to comparison exams. 3 mm peripheral left lower lobe nodule (6/121), unchanged and benign. No pleural fluid. Airway is unremarkable.  Reser Abdomen: Liver margin is irregular. Visualized portions of the liver, adrenal glands, kidneys, spleen, pancreas,  stomach and bowel are otherwise unremarkable with the exception of a small hiatal hernia.  Musculoskeletal: Degenerative changes in the spine. No worrisome lytic or sclerotic lesions.  IMPRESSION: 1. Pulmonary parenchymal pattern of fibrosis is unchanged from 11/13/2017 and likely due to usual interstitial pneumonitis. Findings are categorized as probable UIP per consensus guidelines: Diagnosis of Idiopathic Pulmonary Fibrosis: An Official ATS/ERS/JRS/ALAT Clinical Practice Guideline. [place], Iss 5, [phone], Apr 25 2017. 2. Distal aortic arch aneurysm. Aortic aneurysm NOS (ICD10-I71.9). Recommend annual imaging followup by CTA or MRA. This recommendation follows 2010  ACCF/AHA/AATS/ACR/ASA/SCA/SCAI/SIR/STS/SVM Guidelines for the Diagnosis and Management of Patients with Thoracic Aortic Disease. Circulation.2010; 121: V371-G626. Aortic aneurysm NOS (ICD10-I71.9). 3. Marginal irregularity the liver is indicative of cirrhosis. 4. Aortic atherosclerosis (ICD10-I70.0). Coronary artery calcification. 5. Enlarged pulmonic trunk, indicative of pulmonary arterial hypertension.  Assessment and Plan:  1.  Coronary artery calcifications by CT imaging, dates back at least to 2019 imaging studies based on review of the chart.  This is noted in an 86 year old woman with hyperlipidemia and essential hypertension as well as chronic hypoxic respiratory failure with idiopathic pulmonary fibrosis.  ECG is normal today.  My sense is that conservative management is the most appropriate initial step.  Adding aspirin 81 mg daily, will continue both atenolol and Zocor.  We will quantify LVEF by echocardiogram.  2.  Enlarged pulmonary trunk by CT imaging in the setting of idiopathic pulmonary fibrosis.  Question of pulmonary hypertension raised.  Recommending an echocardiogram as noninvasive assessment of cardiac structure and function and PASP.  This information might be helpful in guiding possible pulmonary treatment options, although it sounds like they are limited based on review of the chart.  3.  Essential hypertension by history, systolic is normal today.  She is on atenolol.  4.  Mixed hyperlipidemia, on Zocor.  Medication Adjustments/Labs and Tests Ordered: Current medicines are reviewed at length with the patient today.  Concerns regarding medicines are outlined above.   Tests Ordered: Orders Placed This Encounter  Procedures  . EKG 12-Lead  . ECHOCARDIOGRAM COMPLETE    Medication Changes: Meds ordered this encounter  Medications  . aspirin EC 81 MG tablet    Sig: Take 1 tablet (81 mg total) by mouth daily.    Dispense:  90 tablet    Refill:  3    09/27/2019  NEW    Disposition:  Follow up test results.  Signed, Satira Sark, MD, [facility] 09/27/2019 2:54 PM    [place] at [place], [place],  [address] Phone: [phone]; Fax: [phone]

## 2019-09-27 NOTE — Patient Instructions (Addendum)
Medication Instructions:    Your physician has recommended you make the following change in your medication:   Start aspirin 81 mg by mouth daily  Continue other medications the same  Labwork:  NONE  Testing/Procedures: Your physician has requested that you have an echocardiogram. Echocardiography is a painless test that uses sound waves to create images of your heart. It provides your doctor with information about the size and shape of your heart and how well your heart's chambers and valves are working. This procedure takes approximately one hour. There are no restrictions for this procedure.  Follow-Up:  Your physician recommends that you schedule a follow-up appointment in: pending.   Any Other Special Instructions Will Be Listed Below (If Applicable).  If you need a refill on your cardiac medications before your next appointment, please call your pharmacy.

## 2019-09-29 ENCOUNTER — Ambulatory Visit (INDEPENDENT_AMBULATORY_CARE_PROVIDER_SITE_OTHER): Payer: Medicare PPO | Admitting: Internal Medicine

## 2019-09-29 ENCOUNTER — Other Ambulatory Visit: Payer: Self-pay | Admitting: General Surgery

## 2019-09-29 ENCOUNTER — Other Ambulatory Visit: Payer: Self-pay

## 2019-09-29 DIAGNOSIS — I2510 Atherosclerotic heart disease of native coronary artery without angina pectoris: Secondary | ICD-10-CM

## 2019-09-29 DIAGNOSIS — J84112 Idiopathic pulmonary fibrosis: Secondary | ICD-10-CM | POA: Diagnosis not present

## 2019-09-29 DIAGNOSIS — J9611 Chronic respiratory failure with hypoxia: Secondary | ICD-10-CM | POA: Diagnosis not present

## 2019-09-29 DIAGNOSIS — I288 Other diseases of pulmonary vessels: Secondary | ICD-10-CM | POA: Diagnosis not present

## 2019-09-29 NOTE — Patient Instructions (Addendum)
ICD-10-CM   1. IPF (idiopathic pulmonary fibrosis) ([facility])  J84.112   2. Chronic respiratory failure with hypoxia (H[facility])  J96.11   3. Coronary artery calcification seen on CT scan  I25.10   4. 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

## 2019-09-29 NOTE — Progress Notes (Signed)
PCP Ashlee Chroman, MD  HPI  IOV 11/03/2017  Chief Complaint  Patient presents with  . Consult    Self referral. Pt states she was diagnosed with pulmonary fibrosis x4 weeks ago. States she has c/o cough with green mucus, mild SOB. Denies any CP.    86 year old female self-referred for evaluation of pulmonary fibrosis.  She is fairly good historian.  She tells me that she has diagnosis of psoriasis of the skin in 2016 and was under the care of Dr. Channing Martin in [place].  She was on Humira for 11 months taking monthly injections but her rash got bad in November 2017 and then she was treated with prednisone.  She went on vacation to [place] and then returned and then had a diagnosis of pneumonia.  She was hospitalized for this at [facility] in January 2018.  She was on oxygen and hospitalized for 3 or 4 days and then discharged.  After that she is always had some amount of residual cough and mild shortness of breath but starting approximately 6 months ago the cough and shortness of breath was more perceptible.  However in the last 2 months a significant acceleration in the severity of cough and shortness of breath.  The cough is described as waking up at night and associated with phlegm.  She also gets short of breath walking or hurrying on level ground or walking up a slight hill.  Her primary care physician did a CT chest with contrast in February 2019.  I do not have the image with me but I reviewed the report and he reports pulmonary fibrosis not otherwise specified.  She did end up in the emergency room on October 31, 2017 and is being discharged with [facility] and redness on taper which is helping.   American College of chest physicians interstitial lung disease questionnaire  Past medical history: Positive for seizures, history of pneumonia in January 2018, acid reflux disease, dry eyes, dry mouth, photophobia and arthralgia.  She has diagnosis of skin psoriasis.  This is not much of an issue  at this point in time  Personal exposure history: She started smoking cigarettes at 86 years of age and smoked 3-4 packs a day and quit in the mid 1980s.  She reports having had use street drugs.  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  She only occasionally visits the coop.  She opens the door and looks that she can out.  Otherwise no exposure to water damage or mold a humidifier [place] or hot tub or Jacuzzi or birds or feathered pillows.  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  Then she retired and moved to [place] in [place] where she lived in normal homes for 5 years up until 8 years ago and then she and her husband moved to [place], [place] where they live in a farm.  Pulmonary toxicity history: Other than exposure to Humira and short-term prednisone no other pulmonary toxicity history including nitrofurantoin or BCG or amiodarone or chemotherapy    Walking desaturation test on 11/03/2017 185 feet x 3 laps on ROOM AIR:  did walk at slow pace and got mildly dyspneic. Did desaturate to 89%. Rest pulse ox was 98%, final pulse ox was 89%. HR response 67/min at rest to 91/min at peak exertion. [name]  Did not  Desaturate < 88% . Ashlee Martin did yes  Desaturated </= 3% points. Ashlee Martin yes did get tachyardic    12/08/2017 Acute OV :Sinus congestion .  Patient presents for an acute office visit.  Patient complains of sinus congestion , ear fullness and popping , sinus pressure , drippy nose, watery eyes, very little cough .  Has had this for last few months , Worse for the last 2 weeks .  Seen by PCP last week started on allergy pill and nasal spray , she does not know what they were.  Says she has chronic allergy problems , worse in Spring and Fall.  She denies any discolored mucus, sinus pain, teeth pain, fever, nausea vomiting diarrhea, or  chest pain.    Patient was seen for pulmonary consult March 2019 to establish for pulmonary fibrosis.  She has underlying psoriasis.  Patient was set up for a CT chest that showed fibrotic interstitial lung disease with mild honeycombing.  Autoimmune panel was essentially negative except for SS B IgG antibody.  And sed rate was mildly elevated at 34. Patient has an upcoming PFT and follow-up visit with ILD clinic in 2 weeks. She was having a severe cough last visit.  She was given a prednisone taper.  Patient says she did require one other prednisone taper but her cough is almost totally resolved.  She has a little mild cough in the morning but very mild.   OV 12/29/2017  Chief Complaint  Patient presents with  . Follow-up    Pt states she has had both good days and bad days. States she has had some problems with allergies.    Ashlee Martin presns for followup for ILD workup.it is documented below.  Pulmonary function test in terms of severity shows moderate restriction with significant reduction in diffusion capacity.  Her high-resolution CT scan of the chest is read as probable UIP but I wonder if that is mixed emphysema although she quit smoking many decades ago and indeterminate pattern for UIP.  There might be air trapping but this is not reported.  I again went over her exposures and she confirms and her husband who is here with her today that there is significant chicken coop exposure ongoing currently.  There are no new interim issues other than the fact she had a respiratory exacerbation which required prednisone which seemed to help her symptoms considerably.  Currently she is off prednisone.  Her autoimmune vasculitis and hypersensitivity pneumonitis panel was essentially negative except for trace autoimmune positivity.     Results for Ashlee Martin, Ashlee Martin (MRN [id number]) as of 12/29/2017 13:37  Ref. Range 12/29/2017 12:43  FVC-Pre Latest Units: L 1.49  FVC-%Pred-Pre Latest Units: % 58    FEV1-Pre Latest Units: L 1.18  FEV1-%Pred-Pre Latest Units: % 62  Pre FEV1/FVC ratio Latest Units: % 79  FEV1FVC-%Pred-Pre Latest Units: % 108  Results for Ashlee Martin, Ashlee Martin (MRN [id number]) as of 12/29/2017 13:37  Ref. Range 12/29/2017 12:43  DLCO unc Latest Units: ml/min/mmHg 7.82  DLCO unc % pred Latest Units: % 30   Results for Ashlee Martin, Ashlee Martin (MRN [id number]) as of 12/29/2017 13:37  Ref. Range 11/03/2017 10:08  ASPERGILLUS FUMIGATUS Latest Ref Range: NEGATIVE  NEGATIVE  Pigeon Serum Latest Ref Range: NEGATIVE  NEGATIVE  Anit Nuclear Antibody(ANA) Latest Ref Range: NEGATIVE  NEGATIVE  Angiotensin-Converting Enzyme Latest Ref Range: 9 - 67 U/L 28  Cyclic Citrullin Peptide Ab Latest Units: UNITS <16  ds DNA Ab Latest Units: IU/mL <1  ENA RNP  Ab Latest Ref Range: 0.0 - 0.9 AI <0.2  Myeloperoxidase Abs Latest Units: AI <1.0  Serine Protease 3 Latest Units: AI <1.0  RA Latex Turbid. Latest Ref Range: <14 IU/mL <14  SSA (Ro) (ENA) Antibody, IgG Latest Ref Range: <1.0 NEG AI <1.0 NEG  SSB (La) (ENA) Antibody, IgG Latest Ref Range: <1.0 NEG AI 1.8 POS (A)  Scleroderma (Scl-70) (ENA) Antibody, IgG Latest Ref Range: <1.0 NEG AI <1.0 NEG    IMPRESSION: 1. Spectrum of findings compatible with fibrotic interstitial lung disease with mild honeycombing. Despite the absence of a clear basilar gradient, these findings are considered to represent probable usual interstitial pneumonia (UIP). Fibrotic phase nonspecific interstitial pneumonia (NSIP) is on the differential. Follow-up high-resolution chest CT in 6-12 months would be useful to assess temporal pattern stability, as clinically warranted. 2. Tiny 3 mm solid left lower lobe pulmonary nodule. No follow-up needed if patient is low-risk. Non-contrast chest CT can be considered in 12 months if patient is high-risk. This recommendation follows the consensus statement: Guidelines for Management of Incidental Pulmonary Nodules Detected on CT Images:From  the Fleischner Society 2017; published online before print (10.1148/radiol.6803212248). 3. Three-vessel coronary atherosclerosis. 4. Dilated main pulmonary artery, suggesting pulmonary arterial hypertension.  Aortic Atherosclerosis (ICD10-I70.0).   Electronically Signed   By: Ilona Sorrel M.D.   On: 11/13/2017 16:46   OV 02/02/2018  Chief Complaint  Patient presents with  . Follow-up    Pt states things have been up and down since last visit and since bronch was performed. Pt still has problems with upset stomach and pt is still having problems with her breathing. Pt does cough in the morning with occ. green mucus. Denies any CP/chest tightness.    Follow-up interstitial lung disease.  This follow-up is after bronchoscopy with lavage which was done Jan 07, 2018.  This shows a slight preponderance of polymorphs and absence of lymphocytes thus suggesting this is not hypersensitivity pneumonitis but with the increase in polymorphs the diagnosis shift towards UIP.  I took a second opinion on radiology from Dr. Lorin Picket and she feels a CT scan is more consistent with UIP.  In the interim patient has no new complaints.  Walking desaturation test indicates a 7% pulse ox drop with tachycardia and shortness of breath but still does not drop below 88%.  Of note she does indicate that she has had chronic intermittent stress related nausea that has been worked up extensively but without any cause.   Results for Ashlee Martin, Ashlee Martin (MRN [id number]) as of 01/07/2018 20:04  Ref. Range 01/07/2018 14:35  Color, Fluid Latest Ref Range: YELLOW  STRAW (A)  WBC, Fluid Latest Ref Range: 0 - 1,000 cu mm 73  Lymphs, Fluid Latest Units: % 6  Eos, Fluid Latest Units: % 4  Appearance, Fluid Latest Ref Range: CLEAR  CLOUDY (A)  Other Cells, Fluid Latest Units: % CORRELATE WITH CY...  Neutrophil Count, Fluid Latest Ref Range: 0 - 25 % 55 (H)  Monocyte-Macrophage-Serous Fluid Latest Ref Range: 50 - 90 % 35 (L)      OV 03/15/2018  Chief Complaint  Patient presents with  . Follow-up    Pt began taking Esbriet but had been having problems keeping food down while taking the med. Med was stopped 7/12 by MW due to the possible reaction and was told to stop med until OV with MR. Other than the beginning problems with Esbriet, pt states she has been doing good since last visit   Ashlee Claude  A Martin , 86 y.o. , with dob 31-Aug-1930 and female ,Not Hispanic or Latino from [address] [place] [address] - presents to ILD  clinic for IPF diagnosis (he is aage > 39 (age 59), probable UIP on CT scan with honeycombing early, negative autoimmune and vasculitis serology and bronchoscopy 01/07/18 with 55% neutrophils on lavage) made 02/02/18 and esbriet decision taken 02/02/18   In terms of her IPF she is doing stable. She does not feel any worse. She gets dyspneic when walking out of her air conditioned room to the hot humid air she does not feel any worse. Walking desaturation test showed desaturation to 87% which seems a little bit worse than before but she denies that she is worse. She does not want to use portable oxygen but she is willing to get herself tested at night. There is no worsening cough or sputum production.  The main issue appears to be that on 03/05/2018 she started her first dose of Pirfenidone (Esbriet). She took one dose and felt fine and then 4 hours later immediately after lunch 12 her food and had nausea and vomiting. She does not believe that the nausea and vomiting were related to the Pirfenidone (Esbriet). She insetead thinks it is a recurrence of her chronic intermittent nausea and vomiting which is had for the last few years. We took more history on this and she tells me that she's had this for the last few years. It is stable. She says primary care physician worked up extensively. However shnever  gastroenterologist or had an endoscopy for this.   OV 04/27/2018  Chief Complaint  Patient  presents with  . Follow-up    Pt had endoscopy performed 8/27. Pt states she has been having problems with pain and states she still has occ SOB but that is better and has an occ cough in the mornings. Denies any CP.     Ashlee Martin , 86 y.o. , with dob 1931-04-07 and female ,Not Hispanic or Latino from [address] [place] [address] - presents to ILD  clinic for IPF diagnosis (he is aage > 18 (age 25), probable UIP on CT scan with honeycombing early, negative autoimmune and vasculitis serology and bronchoscopy 01/07/18 with 55% neutrophils on lavage) made 02/02/18 and esbriet decision taken 02/02/18 , restart date 03/15/18   Ashlee Martin - presents for follow-up of her IPF. Currently she restarted her Pirfenidone (Esbriet) 03/15/2018 and escalated to 2 pills 3 times a day which she has held at that dose. She says that her fatigue is gone from mild pre-Pirfenidone (Esbriet) 2 severe post Pirfenidone (Esbriet). In addition she has weight loss. Her weight loss is 9 poundssince she started the Pirfenidone (Esbriet). She is also having extremely poor appetite. She did seeGI Dr. Barney Drain and had endoscopy 04/20/2018. According to the patientit sounds like she might have had gastritis but I'm not so sure. She's also had ultrasound of the liver and is awaiting the results. She tells me 4 days up with endoscopy she did have one episodes of nausea and vomiting. She feels that the severe D of the nausea and vomiting and the frequency of this is at baseline and she does not think the Pirfenidone (Esbriet) has made this worse. However she is categorical that the fatigue low appetite and weight loss are related to Pirfenidone (Esbriet).She is using oxygen with pulmonary rehabilitation. She is more short of breath. Walking desaturation test today shows that she did  desaturate much earlier than at baseline. Also 03/31/2018 she had overnight oxygen study and the pulse ox was less than 88%  For 7-1/2 hours. She  is refusing to wear portable oxygen. She has agreed to wear oxygen during rehabilitation and at night  OV 05/26/2018  Subjective:  Patient ID: Ashlee Martin, female , DOB: 07-01-1931 , age 62 y.o. , MRN: [id number] , ADDRESS: [place] [id number]   05/26/2018 -   Chief Complaint  Patient presents with  . Follow-up    PFT attempted but pt was unable to complete it. Pt has had worsening  and was admitted to the hospital 9/11-9/13 due to a UTI and O2 sats dropping to 70%. Pt states she does have a mild cough in the mornings. Denies any complaints of CP. Pt is now having to wear O2 due to her breathing worsening and is on 3L with exertion and 2L at rest.     HPI Ashlee Martin 86 y.o. -presents IPF-year follow-up.  Last seen April 27, 2018.  Just before that end of August 2019 she underwent endoscopy that showed acid reflux and peptic stricture.  According to the patient she was stretched.  She has stopped as.  After the visit April 27, 2018.  Her vomiting episodes have improved although not completely resolved..  It is unclear if this is because of Pirfenidone (Esbriet) being stopped or because of the endoscopy.  In any event May 05, 2018 she was admitted at [facility].  She brought the outside records with me.  It appears that she had E. coli UTI.  In addition her BNP was high at 1600.  She had a CT scan of the chest that I was able to visualized.  There is evidence of worsening of ILD but this was in the form of a high BNP.  At this point in time she is better but somewhat fatigued.  She has been discharged with continuous oxygen although when we turned her oxygen off which was set at 3 L and turn it off for 20 minutes she had normal pulse ox at rest and desaturated at the first left.  So it appears that her hypoxemia is improved compared to admission but it is still worse compared to a visit 1 month ago.       ROS  OV 06/24/2018  Subjective:  Patient ID:  Ashlee Martin, female , DOB: 15-Sep-1930 , age 41 y.o. , MRN: [id number] , ADDRESS: [place] [id number]   06/24/2018 -   Chief Complaint  Patient presents with  . Follow-up    States her breathing has been good at her basline. Stll concerned about her weight.      HPI Ashlee Martin 86 y.o. -has idiopathic pulmonary fibrosis.  Currently on supportive care of having significant side effects suspected from Pirfenidone (Esbriet).  At this point in time she is been off Pirfenidone (Esbriet) for several weeks.  She is now gained 5 pounds of weight.  Her appetite is returned to normal.  She still has an occasional mild intermittent nausea vomiting which is her baseline idiopathic problem.  She does not have any diarrhea.  She is also started using oxygen.  Without being on Esbriet and also being on oxygen she feels her quality of life is actually improved with less shortness of breath.  She and she is less miserable.  However CT scan and pulmonary function tests show slight decline in  lung function with no fibrosis as documented below.  The CT scan was personally visualized by me.  She continues to have some cough for which she wants [employer]  We discussed nintedanib as an alternative option (see below().  We also discussed research as a care option but her low DLCO makes it difficult for her to qualify for IV or inhaler related studies that do not have GI side effects.   - OFEV  - - time to first exacerbation possibly reduced in one trial but not in another - twice daily, no titration, potentially more convenient dosing  - no need for sunscreen  - high chance of mild diarrhea but low chance of significant diarrhea needing to stop medication.   - Rx diarrhea with lomotil - slight increase in heart attack risk and theoretical increase in bleeding risk,   - need monthly blood work for 3 months and then every 6 months - monitor liver function       Results for Ashlee Martin, Ashlee Martin (MRN [id number]) as of 06/24/2018 09:25  Ref. Range 12/29/2017 12:43 06/16/2018 10:33  FVC-Pre Latest Units: L 1.49 1.54  FVC-%Pred-Pre Latest Units: % 58 60  Results for Revell, KEYARI KLEEMAN (MRN [id number]) as of 06/24/2018 09:25  Ref. Range 12/29/2017 12:43 06/16/2018 10:33  DLCO unc Latest Units: ml/min/mmHg 7.82 6.02  DLCO unc % pred Latest Units: % 30 23   Results for Trebilcock, SHERA LAUBACH (MRN [id number]) as of 06/24/2018 09:25  Ref. Range 04/27/2018 15:06  Hemoglobin Latest Ref Range: 12.0 - 15.0 g/dL 13.6   HRCT 06/15/18 Lungs/Pleura: High-resolution images again demonstrate widespread areas of septal thickening, subpleural reticulation, thickening of the peribronchovascular interstitium, traction bronchiectasis and frank honeycombing. Very mild craniocaudal gradient. Findings appear slightly progressive compared to the prior study from 11/13/2017. No confluent consolidative airspace disease. No pleural effusions. Inspiratory and expiratory imaging demonstrates some mild to moderate air trapping indicative of small airways disease. No definite suspicious appearing pulmonary nodules or masses are noted. IMPRESSION: 1. Previously noted left Knaggs lobe nodule has completely resolved, indicative of an infectious or inflammatory etiology on the prior study. 2. Chronic changes of interstitial lung disease again considered probable usual interstitial pneumonia (UIP) per ATS guidelines. 3. Aortic atherosclerosis, in addition to left main and 3 vessel coronary artery disease. Please note that although the presence of coronary artery calcium documents the presence of coronary artery disease, the severity of this disease and any potential stenosis cannot be assessed on this non-gated CT examination. Assessment for potential risk factor modification, dietary therapy or pharmacologic therapy may be warranted, if clinically indicated.  Aortic Atherosclerosis (ICD10-I70.0).   Electronically  Signed   By: Vinnie Langton M.D.   On: 06/15/2018 17:00    08/05/2018  - Visit   86 year old female patient completing follow-up with our office today. Pt is currently on OFEV and patient is doing well.  Patient attributes her success on the medication to to the nursing education from the pharmaceutical company.  Patient reports she has had occasional diarrhea but this is been managed well with Lomotil.  Patient feels much better on this medication than Esbriet.  Patient denies drops in oxygen and feels that she is able to do more things around the house.  Patient thinks the medication is going well.  Patient is interested in traveling to [place] as she usually does this every year.  Patient is wondering how to coordinate this with her oxygen needs now.  Patient is wondering if she  could switch her DME company from [facility] to [employer].   Records are showing that patient is due for pneumonia vaccine.  Patient believes she has received [facility] she will check her [facility] as well as at [facility] in bring to next office visit.    06/15/18 -CT chest high-res- previously noted left Wilczak lobe nodules completely resolved, chronic changes of interstitial lung disease again considered probable UIP   OV 09/16/2018  Subjective:  Patient ID: Ashlee Martin, female , DOB: 02/08/1931 , age 45 y.o. , MRN: [id number] , ADDRESS: [place] [id number]   09/16/2018 -   Chief Complaint  Patient presents with  . Follow-up    Pt states she had been doing good until 4 days ago and stated she began throwing up on Sunday and also threw up Monday night as well as Tuesday and also had diarrhea. Pt states SOB is about the same as last visit.   IPF with chronic hypoxemic respiratory failure 2 L at rest.  Failed Esbriet due to GI intolerance.  Started on nintedanib early 2019 early November  HPI Merl A Sabater 86 y.o. -after starting nintedanib she saw a nurse practitioner mid  December 2019.  At that time she was tolerating it well.  Liver function test was fine.  She now reports for follow-up she tells me that she has new onset of diarrhea since starting nintedanib.  This happens around 2 times a week.  It happens abruptly without any warning and she wets her bed or with her self.  Otherwise the severity itself is only mild to moderate.  But the main issue is that it happens abruptly.  She responds by taking Imodium and then she does not have diarrhea for a few days till it recurs again abruptly.  In addition for the last few days she has had intermittent nausea and vomiting.  This was not an issue with nintedanib until now.  It was a big issue with Esbriet along with weight loss which made her stop the Esbriet.  She has not tried any Zofran for this.  Otherwise in terms of her IPF she feels clinically stable.  She is bothered a lot by chronic pain in her joints.  She will talk to her primary care physician about this.   OV 06/20/2019  Subjective:  Patient ID: Ashlee Martin, female , DOB: 16-May-1931 , age 21 y.o. , MRN: [id number] , ADDRESS: [place] [id number]   06/20/2019 -   Chief Complaint  Patient presents with  . Follow-up    Patient reports that she is doing well at this time.    IPF with chronic hypoxemic respiratory failure 2 L at rest.  Failed Esbriet due to GI intolerance.  Started on nintedanib early 2019 early November and stopped SEpt 2012  HPI Elaf A Kernodle 86 y.o. -she has had at least 2 documented options to nintedanib  in April 2020 and currentl since September 2020.  There is possibly a third interaction that happened earlier in March 2020 but she does not reembmer  Most recently saw a nurse practitioner May 30, 2019 at which time she was off her nintedanib because of diarrhea.  She reported to the nurse practitioner that after stopping the nintedanib the diarrhea had improved and her fatigue improved and her appetite also  improved.  Overall quality of life is much better after stopping antifibrotic's.  And finally she started having weight  gain as well.  The nintedanib has been stopped at that point till September 2020. On May 31, 2019 she underwent colonoscopy by Dr. Trinda Pascal [this was 1 day after seeing our nurse practitioner in the office] this is because of rectal bleeding.  She was noticed to have a tortuous colon and diverticulosis in the rectosigmoid colon and the transverse colon and ascending colon.  The rectal bleeding was thought secondary to internal hemorrhoids.  No colonic source of weight loss was discovered.  She was recommended high-fiber diet.  And return to the office in 6 months.  At this point in time she tells me that she does not want to go back on pirfenidone or nintedanib.  Her weight has been.  She is feeling better.  She says she lost 15 pounds of weight.  She is interested in IV infusion therapy through research protocol if offered for her IPF.  She uses 2 L of oxygen at rest and exertion.  Occasionally bumped it up to 3 L. Walking desat test shows stability x 1 y ear  Last pulmonary function test was October 2019 Last high-resolution CT chest was October 2019    OV 09/29/2019  Subjective:  Patient ID: Ashlee Martin, female , DOB: 27-Apr-1931 , age 84 y.o. , MRN: [id number] , ADDRESS: [place] [id number]   IPF with chronic hypoxemic respiratory failure 2 L at rest.  Failed Esbriet due to GI intolerance.  Started on nintedanib  2019 early November and stopped finally sept 2020 with 2-3 interruptions in between   09/29/2019 -  = telephone visit for IPF.  She is on supportive care but prescriptioin o2.  She recently had PFTs and cT and this is discuss those results. Identiied with 2 PHI. She has new coronary atery calcificatioand enalarged PA (new issues for [place]) - saw Dr Johnny Bridge cards- echo has been ordered and otherwise supportive care. Breathing stable. No fatigue  . No GI issues since off antifiboriics. On o2 - 2L at resting, 3L with walking and at night [facility]  WEnt over PFT - fvc stable, but dlco declined.- this is posing a challenge for research trials as care optinos for which she is interested. Either age or dlco is an issue. In future TETON study inhaler tyvaso can be an option if we are a site     SYMPTOM SCALE - ILD 06/20/2019   O2 use 2-3L  Shortness of Breath 0 -> 5 scale with 5 being worst (score 6 If unable to do)  At rest 0  Simple tasks - showers, clothes change, eating, shaving 0  Household (dishes, doing bed, laundry) 0  Shopping 3  Walking level at own pace 0  Walking keeping up with others of same age 30  Walking up Stairs na  Walking up [employer]  Total (40 - 48) Dyspnea Score x  How bad is your cough? 0  How bad is your fatigue 2        Simple office walk 185 feet x  3 laps goal with forehead probe 02/02/2018 Start esbriet 03/15/2018 contnue esbriet 04/27/2018 Stop esbriet 05/26/2018  06/20/2019    O2 used Room air Room air Room air  room air Room air  Number laps completed _0 but desat at 2nd lap  desaturated at first lap deagurated at first lap  Comments about pace nrmal pacte Normal pace Slow pace  slow pace Slow pace  Resting Pulse Ox/HR 98% and 83/min  98% and HR 85/min 96% and HR 63/min  98% and 88/min 98% and 70/min  Final Pulse Ox/HR 91% and 98/min 87% and HR 91% at end of 3rd lao 87% and HR 79 at end of 2nd lap  86% and heart rate of 95 at first lab 87% and HR 84/in  Desaturated </= 88% no yes Yes -  yes   Desaturated <= 3% points yes yes yes  yes   Got Tachycardic >/= 90/min yes yes no  yes   Symptoms at end of test dyspnea Mild dyspnea x  dyspnea   Miscellaneous comments Normal pace Normal pace Worse desats  worsening desaturation.  Corrected with 3 laps of walking 2 L similr to before    Results for PHYLLISS, STREGE (MRN [id number]) as of 09/29/2019 11:28  Ref. Range 12/29/2017 12:43 06/16/2018 10:33 09/01/2019  13:38  FVC-Pre Latest Units: L 1.49 1.54 1.70  FVC-%Pred-Pre Latest Units: % 58 60 69    Results for Fullman, KORAIMA ALBERTSEN (MRN [id number]) as of 09/29/2019 11:28  Ref. Range 12/29/2017 12:43 06/16/2018 10:33 09/01/2019 13:38  DLCO unc Latest Units: ml/min/mmHg 7.82 6.02 5.15 (corrected 5.48 )  DLCO unc % pred Latest Units: % _0 (28.73 wth correction)    Results for Hiltz, ZALAYA ASTARITA (MRN [id number]) as of 09/29/2019 11:28  Ref. Range 09/07/2019 13:41  Hemoglobin Latest Ref Range: 12.0 - 15.0 g/dL 11.6 (L)    IMPRESSION: 1. Pulmonary parenchymal pattern of fibrosis is unchanged from 11/13/2017 and likely due to usual interstitial pneumonitis. Findings are categorized as probable UIP per consensus guidelines: Diagnosis of Idiopathic Pulmonary Fibrosis: An Official ATS/ERS/JRS/ALAT Clinical Practice Guideline. [place], Iss 5, [phone], Apr 25 2017. 2. Distal aortic arch aneurysm. Aortic aneurysm NOS (ICD10-I71.9). Recommend annual imaging followup by CTA or MRA. This recommendation follows 2010 ACCF/AHA/AATS/ACR/ASA/SCA/SCAI/SIR/STS/SVM Guidelines for the Diagnosis and Management of Patients with Thoracic Aortic Disease. Circulation.2010; 121: F573-U202. Aortic aneurysm NOS (ICD10-I71.9). 3. Marginal irregularity the liver is indicative of cirrhosis. 4. Aortic atherosclerosis (ICD10-I70.0). Coronary artery calcification. 5. Enlarged pulmonic trunk, indicative of pulmonary arterial hypertension.   Electronically Signed   By: Lorin Picket M.D.   On: 09/07/2019 13:59 ROS - per HPI     has a past medical history of Arthritis, Coronary artery calcification seen on CT scan, Depression, Essential hypertension, GERD (gastroesophageal reflux disease), History of seizures, Hypercholesterolemia, ILD (interstitial lung disease) ([place]), and Psoriasis.   reports that she quit smoking about 46 years ago. Her smoking use included cigarettes. She has a 60.00 pack-year  smoking history. She has never used smokeless tobacco.  Past Surgical History:  Procedure Laterality Date  . BIOPSY  04/20/2018   Procedure: BIOPSY;  Surgeon: Danie Binder, MD;  [facility];  Service: Endoscopy;;  gastric  . COLONOSCOPY  01/2011   Dr. Anthony Sar: normal.   . COLONOSCOPY WITH PROPOFOL N/A 05/31/2019   Procedure: COLONOSCOPY WITH PROPOFOL;  Surgeon: Danie Binder, MD;  [facility];  Service: Endoscopy;  Laterality: N/A;  10:30am  . DILATION AND CURETTAGE OF UTERUS     x3  . ESOPHAGOGASTRODUODENOSCOPY (EGD) WITH PROPOFOL N/A 04/20/2018   benign-appearing, intrinsic mild stenosis, s/p dilation. Mild inflammation entire stomach, s/p biopsy that was negative for H.pylori. Pylorus normal but had changes consistent with PRIOR ulcer disease. Duodenum normal.   . ESOPHAGOGASTRODUODENOSCOPY (EGD) WITH PROPOFOL N/A 05/31/2019   Procedure: ESOPHAGOGASTRODUODENOSCOPY (EGD) WITH PROPOFOL;  Surgeon: Danie Binder, MD;  [facility];  Service: Endoscopy;  Laterality: N/A;  . SAVORY DILATION N/A 04/20/2018   Procedure: SAVORY DILATION;  Surgeon: Danie Binder, MD;  [facility];  Service: Endoscopy;  Laterality: N/A;  . VIDEO BRONCHOSCOPY Bilateral 01/07/2018   Procedure: VIDEO BRONCHOSCOPY WITHOUT FLUORO;  Surgeon: Brand Males, MD;  [facility];  Service: Cardiopulmonary;  Laterality: Bilateral;  . WRIST SURGERY Right    ORIF    Allergies  Allergen Reactions  . Humira [Adalimumab] Other (See Comments)    Worsened your psoriasis.  . Pirfenidone Other (See Comments)    Fatigue and weight loss    Immunization History  Administered Date(s) Administered  . Fluad Quad(high Dose 65+) 05/10/2019  . Influenza, High Dose Seasonal PF 06/10/2017, 04/27/2018  . Pneumococcal Conjugate-13 12/09/2014, 09/05/2018    Family History  Problem Relation Age of Onset  . Heart disease Mother   . Heart disease Father   . Cancer Sister          GYN  . Colon cancer Neg Hx      Current Outpatient Medications:  .  aspirin EC 81 MG tablet, Take 1 tablet (81 mg total) by mouth daily., Disp: 90 tablet, Rfl: 3 .  atenolol (TENORMIN) 25 MG tablet, Take 25 mg by mouth 2 (two) times daily. , Disp: , Rfl:  .  BIOTIN PO, Take by mouth., Disp: , Rfl:  .  cholecalciferol (VITAMIN D3) 25 MCG (1000 UT) tablet, Take 1,000 Units by mouth daily., Disp: , Rfl:  .  fexofenadine (ALLEGRA) 180 MG tablet, Take 180 mg by mouth 2 (two) times daily., Disp: , Rfl:  .  hydrocortisone (ANUSOL-HC) 2.5 % rectal cream, Place 1 application rectally 2 (two) times daily. (Patient taking differently: Place 1 application rectally 2 (two) times daily as needed for hemorrhoids. ), Disp: 30 g, Rfl: 1 .  lactobacillus acidophilus (BACID) TABS tablet, Take 1 tablet by mouth daily., Disp: , Rfl:  .  mirtazapine (REMERON) 7.5 MG tablet, Take 7.5 mg by mouth at bedtime., Disp: , Rfl:  .  potassium chloride (K-DUR) 10 MEQ tablet, Take 10 mEq by mouth daily. , Disp: , Rfl:  .  simvastatin (ZOCOR) 20 MG tablet, Take 20 mg by mouth at bedtime. , Disp: , Rfl:  .  vitamin B-12 (CYANOCOBALAMIN) 1000 MCG tablet, Take 1,000 mcg by mouth daily. , Disp: , Rfl:       Objective:   There were no vitals filed for this visit.  Estimated body mass index is 23.3 kg/m as calculated from the following:   Height as of 09/27/19: _0  (1.651 m).   Weight as of 09/27/19: 140 lb (63.5 kg).  _1 @  There were no vitals filed for this visit.   Physical Exam  Sounded well on phone        Assessment:       ICD-10-CM   1. IPF (idiopathic pulmonary fibrosis) ([place])  J84.112   2. Chronic respiratory failure with hypoxia (H[facility])  J96.11   3. Coronary artery calcification seen on CT scan  I25.10   4. Enlarged pulmonary artery ([place])  I28.8        Plan:     Patient Instructions     ICD-10-CM   1. IPF (idiopathic pulmonary fibrosis) ([place])  J84.112   2. Chronic respiratory  failure with hypoxia (HCC)  J96.11   3. Coronary artery calcification seen on CT scan  I25.10   4. Enlarged pulmonary artery ([place])  I28.8      Clinically stable Significant intolerance to both pirfenidone and nintedanib Glad you are doing better without pirfenidone and nitedanib Research trials currently not a care option due to exclusion crireria  Plan  - continue o2 2-3L [place] as before  -- do spirometry and dlco in 4 months - can do at [employer] - continue cards followup with Dr Domenic Polite -if you qualify for a research protocol around pulm hypertension you might need a right heart cath  Followup  - 4 months or sooner -15 min slot  - can discuss research options at followup if we have a study available     SIGNATURE    Dr. Brand Males, M.D., F.C.C.P,  Pulmonary and Critical Care Medicine Staff Physician, [place] Director - Interstitial Lung Disease  Program  Pulmonary [place] [facility] [place], [place], [address]  Pager: [phone], If no answer or between  15:00h - 7:00h: call [phone] Telephone: [phone]  11:44 AM 09/29/2019

## 2019-10-08 DIAGNOSIS — R0902 Hypoxemia: Secondary | ICD-10-CM | POA: Diagnosis not present

## 2019-10-08 DIAGNOSIS — J849 Interstitial pulmonary disease, unspecified: Secondary | ICD-10-CM | POA: Diagnosis not present

## 2019-10-08 DIAGNOSIS — M1611 Unilateral primary osteoarthritis, right hip: Secondary | ICD-10-CM | POA: Diagnosis not present

## 2019-10-08 DIAGNOSIS — Z96649 Presence of unspecified artificial hip joint: Secondary | ICD-10-CM | POA: Diagnosis not present

## 2019-10-12 ENCOUNTER — Other Ambulatory Visit: Payer: Medicare PPO

## 2019-10-20 ENCOUNTER — Ambulatory Visit (INDEPENDENT_AMBULATORY_CARE_PROVIDER_SITE_OTHER): Payer: Medicare PPO | Admitting: Pulmonary Disease

## 2019-10-20 ENCOUNTER — Telehealth: Payer: Self-pay

## 2019-10-20 ENCOUNTER — Encounter: Payer: Self-pay | Admitting: Pulmonary Disease

## 2019-10-20 ENCOUNTER — Telehealth: Payer: Self-pay | Admitting: Internal Medicine

## 2019-10-20 DIAGNOSIS — J9611 Chronic respiratory failure with hypoxia: Secondary | ICD-10-CM | POA: Diagnosis not present

## 2019-10-20 DIAGNOSIS — M549 Dorsalgia, unspecified: Secondary | ICD-10-CM

## 2019-10-20 DIAGNOSIS — J84112 Idiopathic pulmonary fibrosis: Secondary | ICD-10-CM

## 2019-10-20 DIAGNOSIS — Z299 Encounter for prophylactic measures, unspecified: Secondary | ICD-10-CM | POA: Diagnosis not present

## 2019-10-20 DIAGNOSIS — M542 Cervicalgia: Secondary | ICD-10-CM | POA: Diagnosis not present

## 2019-10-20 DIAGNOSIS — K219 Gastro-esophageal reflux disease without esophagitis: Secondary | ICD-10-CM

## 2019-10-20 DIAGNOSIS — Z6823 Body mass index (BMI) 23.0-23.9, adult: Secondary | ICD-10-CM | POA: Diagnosis not present

## 2019-10-20 DIAGNOSIS — Z79899 Other long term (current) drug therapy: Secondary | ICD-10-CM | POA: Diagnosis not present

## 2019-10-20 DIAGNOSIS — R5383 Other fatigue: Secondary | ICD-10-CM | POA: Diagnosis not present

## 2019-10-20 DIAGNOSIS — M546 Pain in thoracic spine: Secondary | ICD-10-CM | POA: Diagnosis not present

## 2019-10-20 NOTE — Assessment & Plan Note (Signed)
Plan: Followed by [facility] Patient contacted our office regarding her symptoms I do believe that her back pain is likely related to orthopedic concerns, with that said the patient is reporting persistent GERD symptoms, will route to gastroenterology for further follow-up

## 2019-10-20 NOTE — Patient Instructions (Addendum)
You were seen today by Lauraine Rinne, NP  for:   1. IPF (idiopathic pulmonary fibrosis) ([place])  Keep follow-up with our office  2. Acute back pain, unspecified back location, unspecified back pain laterality  Please present to primary care office today for further evaluation I believe they need to obtain imaging as well as lab work If heart rate exceeds 100 or oxygen levels start to decline please present to an emergency room or contact 911  3. Gastroesophageal reflux disease, unspecified whether esophagitis present  Continue GI medications as prescribed If you continue to have persistent gastroesophageal reflux symptoms please contact GI and schedule an appointment   Follow Up:    Return in about 1 year (around 10/19/2020), or if symptoms worsen or fail to improve, for Follow up with Wyn Quaker FNP-C.   Please do your part to reduce the spread of COVID-19:      Reduce your risk of any infection  and COVID19 by using the similar precautions used for avoiding the common cold or flu:  Marland Kitchen Wash your hands often with soap and warm water for at least 20 seconds.  If soap and water are not readily available, use an alcohol-based hand sanitizer with at least 60% alcohol.  . If coughing or sneezing, cover your mouth and nose by coughing or sneezing into the elbow areas of your shirt or coat, into a tissue or into your sleeve (not your hands). Langley Gauss A MASK when in public  . Avoid shaking hands with others and consider head nods or verbal greetings only. . Avoid touching your eyes, nose, or mouth with unwashed hands.  . Avoid close contact with people who are sick. . Avoid places or events with large numbers of people in one location, like concerts or sporting events. . If you have some symptoms but not all symptoms, continue to monitor at home and seek medical attention if your symptoms worsen. . If you are having a medical emergency, call 911.   [place] / e-Visit: [email or web address]         [facility]: [facility]: [phone]                   [facility]: [phone]     It is flu season:   >>> Best ways to protect herself from the flu: Receive the yearly flu vaccine, practice good hand hygiene washing with soap and also using hand sanitizer when available, eat a nutritious meals, get adequate rest, hydrate appropriately   Please contact the office if your symptoms worsen or you have concerns that you are not improving.   Thank you for choosing [facility] for your healthcare, and for allowing [place] to partner with you on your healthcare journey. I am thankful to be able to provide care to you today.   Wyn Quaker FNP-C

## 2019-10-20 NOTE — Telephone Encounter (Signed)
Spoke with the pt  She is c/o pain that started under her breasts and wraps around her back   She states her breathing has also been worse over the past 1-2 wks with exertion  She has also had "tickle in throat" and some increase in cough, sometimes coughs until she vomits "it's just bile" She has contacted her GI MD with no response back yet  She states that her breathing does seem to be worse since the last ov here so I have her set for televisit with Aaron Edelman today for further eval

## 2019-10-20 NOTE — Assessment & Plan Note (Addendum)
Intolerant of antifibrotic's Recent high-resolution CT chest in January/2021  Plan: Keep regular follow-up with our office in 4 months

## 2019-10-20 NOTE — Progress Notes (Signed)
Virtual Visit via Telephone Note  I connected with Ashlee Martin on 10/20/19 at 11:30 AM EST by telephone and verified that I am speaking with the correct person using two identifiers.  Location: Patient: Home Provider: [facility] - [address], [place], [place], [place] [address]   I discussed the limitations, risks, security and privacy concerns of performing an evaluation and management service by telephone and the availability of in person appointments. I also discussed with the patient that there may be a patient responsible charge related to this service. The patient expressed understanding and agreed to proceed.  Patient consented to consult via telephone: Yes People present and their role in pt care: Pt   History of Present Illness:  86 year old female former smoker followed in our office for idiopathic pulmonary fibrosis (failed Esbriet and 0FEV)  PMH: Nausea vomiting, GERD, UTI, chronic diarrhea Smoker/ Smoking History: Former smoker. Quit 1974. 60-pack-year smoking history Maintenance: none Pt of: Dr. Chase Caller   Chief complaint: Acute visit, cough / back pain  86 year old female former smoker followed in our office for idiopathic pulmonary fibrosis.  Patient has been tried on antifibrotic therapy she was intolerant of both medications.  Patient was last seen by Dr. Chase Caller on 09/29/2019.  At that point in time she is felt to be clinically stable.  She was supposed to remain off of antifibrotic's due to her intolerances.  Continue oxygen therapy as prescribed.  Continue to follow-up with cardiology.  Follow-up in 4 months was planned in a 15-minute slot.  Patient contacted our office today reporting that she is having a pain under her breasts and wraps around her back.  She is noticed that her breathing has been worse over the last 1 to 2 weeks with exertion.  She also has a tickle in the back of her throat.  Patient reporting that neck and back pain worsened  with physical exertion.  She was started on narcotic pain medications for management of the back pain by primary care.  She is also continuing to have persistent acid reflux symptoms.  She is contacted gastroenterology for more recommendations regarding this.  I can see that a call has been placed to [facility] gastroenterology waiting for provider response.  Patient reports that she is having persistent reflux symptoms.  Will route note to gastro provider as FYI.  Patient reports that her symptoms improved with rest.  Wells score for DVT: -1, well score for PE: 1.5.  Only significant risk factor would be the patient has been more sedentary status post COVID-19 first injection.  She reports that she was in the bed for about 3 days.  Heart rate today patient is checked and is stable at 67, oxygen saturations are 99% on baseline O2 2-1/2 L.  Patient denies hemoptysis.  She denies lower extremity swelling or unilateral swelling.  She denies leg pain.  She has no history of blood clots.   Wells Criteria Modified Wells criteria: Clinical assessment for PE Clinical symptoms of DVT (leg swelling, pain with palpation) - 3 Other diagnoses less likely than pulmonary embolism - 3 Heart rate greater than 100 - 1.5 Immobilization (disease greater in 3 days) or surgery in the previous 4 weeks - 1.5  >>>laid in bed for 3 days after covid shot  Previous DVT/PE - 1.5 Hemoptysis - 1 Malignancy - 1  Probability Traditional clinical probability assessment (Wells criteria) High - greater than 6 Moderate - 2 to 6 Low less than 2  Simplify  clinical probability assessment (modified Wells criteria) PE likely-greater than 4 PE unlikely little less than or equal to 4  Pretest probability of DVT (Wells score)  Clinical features: Active cancer (treatment ongoing or within the previous 6 months or palliative) -1 Paralysis, paralysis, or recent plaster immobilization of the lower extremities -1 Recently bedridden for  more than 3 days or major surgery within 4 weeks -1 Localized tenderness along the distribution of the deep vein system -1 Entire leg swollen -1 Calf swelling by more than 3 cm when compared to the asymptomatic leg (measured below tibial tuberosity) -1 Pitting edema (greater and symptomatic leg) -1 Collateral superficial veins -1 Alternative diagnosis as likely or more than likely that of a deep vein thrombosis    -2   High probability - 3 or greater Moderate probability -  1 or 2 Low probability - 0 or less  Modification  DVT likely-2 or greater DVT unlikely-1 or less  Score: -1     Observations/Objective:  10/20/2019 - SPO2 - 99 - 2.5L  10/20/2019 - HR - 67   06/15/18 -CT chest high-res- previously noted left [place] lobe nodules completely resolved, chronic changes of interstitial lung disease again considered probable UIP  09/07/2019 - CT Chest High Res - pulmonary parenchymal pattern of fibrosis is unchanged from 11/13/2017 is likely due to UIP, categorized as probable UIP marginal irregularity of the liver is indicative of cirrhosis, aortic arthrosclerosis, enlarged pulmonary trunk indicative of PAH  Social History   Tobacco Use  Smoking Status Former Smoker  . Packs/day: 3.00  . Years: 20.00  . Pack years: 60.00  . Types: Cigarettes  . Quit date: 11/03/1972  . Years since quitting: 46.9  Smokeless Tobacco Never Used   Immunization History  Administered Date(s) Administered  . Fluad Quad(high Dose 65+) 05/10/2019  . Influenza, High Dose Seasonal PF 06/10/2017, 04/27/2018  . Pneumococcal Conjugate-13 12/09/2014, 09/05/2018      Assessment and Plan:  IPF (idiopathic pulmonary fibrosis) ([place]) Intolerant of antifibrotic's Recent high-resolution CT chest in January/2021  Plan: Keep regular follow-up with our office in 4 months  Back pain Likely patient's back pain is directly related to her chronic neck pain and back pain that primary care started her on  narcotic pain meds for.  Unfortunately does not sound the patient has had any spine or neck imaging.  This would likely be a good step moving forward given the patient's symptoms.  Patient's Wells score for PE as well as DVT are low today.  Heart rate is stable as well as oxygen levels are stable.  Plan: Patient present to primary care office today for further evaluation I believe patient should have imaging to further evaluate her back pain prior to escalating narcotic pain meds It would also be beneficial for the patient to have baseline lab work We will tentatively plan for a 1 day telephone visit with the patient to ensure that she has had primary care follow-up.  GERD (gastroesophageal reflux disease) Plan: Followed by [facility] Patient contacted our office regarding her symptoms I do believe that her back pain is likely related to orthopedic concerns, with that said the patient is reporting persistent GERD symptoms, will route to gastroenterology for further follow-up   Follow Up Instructions:  Return in about 1 year (around 10/19/2020), or if symptoms worsen or fail to improve, for Follow up with Wyn Quaker FNP-C.   I discussed the assessment and treatment plan with the patient. The patient was provided an opportunity  to ask questions and all were answered. The patient agreed with the plan and demonstrated an understanding of the instructions.   The patient was advised to call back or seek an in-person evaluation if the symptoms worsen or if the condition fails to improve as anticipated.  I provided 22 minutes of non-face-to-face time during this encounter.   Lauraine Rinne, NP

## 2019-10-20 NOTE — Assessment & Plan Note (Signed)
Likely patient's back pain is directly related to her chronic neck pain and back pain that primary care started her on narcotic pain meds for.  Unfortunately does not sound the patient has had any spine or neck imaging.  This would likely be a good step moving forward given the patient's symptoms.  Patient's Wells score for PE as well as DVT are low today.  Heart rate is stable as well as oxygen levels are stable.  Plan: Patient present to primary care office today for further evaluation I believe patient should have imaging to further evaluate her back pain prior to escalating narcotic pain meds It would also be beneficial for the patient to have baseline lab work We will tentatively plan for a 1 day telephone visit with the patient to ensure that she has had primary care follow-up.

## 2019-10-20 NOTE — Telephone Encounter (Signed)
Pt called with c/o of back pain from neck down to waist x 1 week. Pain goes around to the [place] abdomen under pts breast. Pt is going to her PCP today. Pt reports that most of her pain is dealing with the back and she is having trouble walking. Pt was advised to call our office back if her PCP feels it's GI related. Pt advised to discuss back pain with PCP.

## 2019-10-21 ENCOUNTER — Other Ambulatory Visit: Payer: Self-pay

## 2019-10-21 ENCOUNTER — Encounter: Payer: Self-pay | Admitting: Pulmonary Disease

## 2019-10-21 ENCOUNTER — Ambulatory Visit (INDEPENDENT_AMBULATORY_CARE_PROVIDER_SITE_OTHER): Payer: Medicare PPO | Admitting: Pulmonary Disease

## 2019-10-21 ENCOUNTER — Telehealth: Payer: Self-pay | Admitting: Pulmonary Disease

## 2019-10-21 DIAGNOSIS — M549 Dorsalgia, unspecified: Secondary | ICD-10-CM | POA: Diagnosis not present

## 2019-10-21 DIAGNOSIS — M5030 Other cervical disc degeneration, unspecified cervical region: Secondary | ICD-10-CM | POA: Diagnosis not present

## 2019-10-21 DIAGNOSIS — M50322 Other cervical disc degeneration at C5-C6 level: Secondary | ICD-10-CM | POA: Diagnosis not present

## 2019-10-21 DIAGNOSIS — J84112 Idiopathic pulmonary fibrosis: Secondary | ICD-10-CM

## 2019-10-21 DIAGNOSIS — M5134 Other intervertebral disc degeneration, thoracic region: Secondary | ICD-10-CM | POA: Diagnosis not present

## 2019-10-21 NOTE — Assessment & Plan Note (Signed)
Intolerant of antifibrotic's Recent high-resolution CT chest in January/2021  Plan: Complete follow-up with our office in June/2021 Complete spirometry with DLCO in June/2021 as ordered

## 2019-10-21 NOTE — Telephone Encounter (Signed)
Called patient to get her scheduled for a PFT in June 2021. She did not answer, left message for her to call back.

## 2019-10-21 NOTE — Progress Notes (Signed)
Virtual Visit via Telephone Note  I connected with Ashlee Martin on 10/21/19 at 11:30 AM EST by telephone and verified that I am speaking with the correct person using two identifiers.  Location: Patient: Home Provider: Office [facility] - [address], [place], [place], [place] [address]   I discussed the limitations, risks, security and privacy concerns of performing an evaluation and management service by telephone and the availability of in person appointments. I also discussed with the patient that there may be a patient responsible charge related to this service. The patient expressed understanding and agreed to proceed.  Patient consented to consult via telephone: Yes People present and their role in pt care: Pt    History of Present Illness:  86 year old female former smoker followed in our office for idiopathic pulmonary fibrosis (failed Esbriet and 0FEV)  PMH: Nausea vomiting, GERD, UTI, chronic diarrhea Smoker/ Smoking History: Former smoker. Quit 1974. 60-pack-year smoking history Maintenance: none Pt of: Dr. Chase Caller   Chief complaint: 1d follow up for back pain   86 year old female former smoker followed in our office for IPF.  Completing 1 day follow-up with the patient as she was having persistent back pain yesterday.  She was able to successfully follow-up with primary care where they obtained neck and back x-rays today.  She also had lab work done.  That is pending.  Patient continues to report baseline oxygen levels at 97% on 2-1/2 L.  As well as a stable heart rate of 65.  History of IPF.  Patient has been intolerant of antifibrotic's.  Observations/Objective:  10/20/2019 - SPO2 - 99 - 2.5L  10/20/2019 - HR - 67   10/21/19 - SPO2 - 97 - 2.5L  10/21/19 - HR - 65   06/15/18 -CT chest high-res- previously noted left [place] lobe nodules completely resolved, chronic changes of interstitial lung disease again considered probable UIP  09/07/2019 - CT Chest  High Res - pulmonary parenchymal pattern of fibrosis is unchanged from 11/13/2017 is likely due to UIP, categorized as probable UIP marginal irregularity of the liver is indicative of cirrhosis, aortic arthrosclerosis, enlarged pulmonary trunk indicative of PAH   Social History   Tobacco Use  Smoking Status Former Smoker  . Packs/day: 3.00  . Years: 20.00  . Pack years: 60.00  . Types: Cigarettes  . Quit date: 11/03/1972  . Years since quitting: 46.9  Smokeless Tobacco Never Used   Immunization History  Administered Date(s) Administered  . Fluad Quad(high Dose 65+) 05/10/2019  . Influenza, High Dose Seasonal PF 06/10/2017, 04/27/2018  . Pneumococcal Conjugate-13 12/09/2014, 09/05/2018      Assessment and Plan:  Back pain Plan: Continue to work with primary care Contact our office next week if you have not heard from your lab results or x-ray results  IPF (idiopathic pulmonary fibrosis) ([place]) Intolerant of antifibrotic's Recent high-resolution CT chest in January/2021  Plan: Complete follow-up with our office in June/2021 Complete spirometry with DLCO in June/2021 as ordered   Follow Up Instructions:  Return in about 4 months (around 02/18/2020), or if symptoms worsen or fail to improve, for Follow up with Dr. Purnell Shoemaker.   I discussed the assessment and treatment plan with the patient. The patient was provided an opportunity to ask questions and all were answered. The patient agreed with the plan and demonstrated an understanding of the instructions.   The patient was advised to call back or seek an in-person evaluation if the symptoms worsen or if the condition  fails to improve as anticipated.  I provided 12 minutes of non-face-to-face time during this encounter.   Lauraine Rinne, NP

## 2019-10-21 NOTE — Patient Instructions (Addendum)
You were seen today by Lauraine Rinne, NP  for:   1. Acute back pain, unspecified back location, unspecified back pain laterality  Continue to work with primary care  Contact your office next week if you have not received your lab results or x-ray imaging results  2. IPF (idiopathic pulmonary fibrosis) ([place])  Unfortunately you are intolerant of both of the antifibrotic's that we have placed you on for management of your fibrosis  We will continue to monitor your fibrosis clinically   Plan to complete your spirometry with DLCO in June/2021 as ordered  Follow-up with our office in June/2021    Follow Up:    Return in about 4 months (around 02/18/2020), or if symptoms worsen or fail to improve, for Follow up with Dr. Purnell Shoemaker.   Please do your part to reduce the spread of COVID-19:      Reduce your risk of any infection  and COVID19 by using the similar precautions used for avoiding the common cold or flu:  Marland Kitchen Wash your hands often with soap and warm water for at least 20 seconds.  If soap and water are not readily available, use an alcohol-based hand sanitizer with at least 60% alcohol.  . If coughing or sneezing, cover your mouth and nose by coughing or sneezing into the elbow areas of your shirt or coat, into a tissue or into your sleeve (not your hands). Langley Gauss A MASK when in public  . Avoid shaking hands with others and consider head nods or verbal greetings only. . Avoid touching your eyes, nose, or mouth with unwashed hands.  . Avoid close contact with people who are sick. . Avoid places or events with large numbers of people in one location, like concerts or sporting events. . If you have some symptoms but not all symptoms, continue to monitor at home and seek medical attention if your symptoms worsen. . If you are having a medical emergency, call 911.   [place] / e-Visit:  [email or web address]         [facility]: [facility]: [phone]                   [facility]: [phone]     It is flu season:   >>> Best ways to protect herself from the flu: Receive the yearly flu vaccine, practice good hand hygiene washing with soap and also using hand sanitizer when available, eat a nutritious meals, get adequate rest, hydrate appropriately   Please contact the office if your symptoms worsen or you have concerns that you are not improving.   Thank you for choosing [facility] for your healthcare, and for allowing [place] to partner with you on your healthcare journey. I am thankful to be able to provide care to you today.   Wyn Quaker FNP-C

## 2019-10-21 NOTE — Assessment & Plan Note (Signed)
Plan: Continue to work with primary care Contact our office next week if you have not heard from your lab results or x-ray results

## 2019-10-25 ENCOUNTER — Telehealth: Payer: Self-pay | Admitting: Pulmonary Disease

## 2019-10-25 NOTE — Telephone Encounter (Signed)
Call back primary care and schedule an appointment for evaluation of her swallowing.  Patient also needs to follow-up with gastroenterology.  This does not sound like a pulmonary issue.  If the patient is concerned regarding her symptoms she can present to an urgent care or an emergency room for further evaluation.  I believe she needs to speak with primary care and gastroenterology first regarding the symptoms.  Patient has IPF but is not managed on any antifibrotic's at this time.  Wyn Quaker, FNP

## 2019-10-25 NOTE — Telephone Encounter (Signed)
Patient has appointment with [facility] 11/29/19 for 6 month follow up for rectal bleeding, N/V and diarrhea.  Called her PCP office Dr. Woody Seller with [facility] Internal [facility] # [phone]. Spoke with Jarrett Soho at Dr. Woody Seller office and made her aware of the information below.  The notes from her 10/20/19 televisit and this telephone notation are to be faxed to [phone] attention Dr. Woody Seller. Jarrett Soho confirmed she will contact [employer] to make them aware of the patient's swallowing issue so it can be addressed during her appointment on 11/29/19.  I called the patient to let her know her PCP office was contacted and GI will be made aware of her swallowing issue. However, if the situation gets worse to seek care from ER or urgent care. Patient voiced understanding. Nothing further needed at this time.

## 2019-10-25 NOTE — Telephone Encounter (Signed)
Called and spoke with Patient.  Patient is seen by Dr. Geoffery Spruce for IPF, last OV was 10/21/19, with Brian,NP. Patient stated she is having choking spells while eating.  Patient states she chokes, gets sob, and coughs until she throws up.  Patient choked on spaghetti today, yesterday was eating chicken soup, and experience choking spell on rice.  Asked Patient if she discussed this problem at 10/21/19 OV with Aaron Edelman, NP.  Patient stated she had not experience this spell in a while, so she did not bring it up.  I asked if she has a GI provider. Patient stated she spoke with GI last week about other problems, and was referred to PCP.  Patient spoke with PCP today, but did not bring up choking problems.  Patient stated colonoscopy and EGD was completed in 05/2019, results were normal. Patient stated she is trying smaller bites, and that is not helping. Patient is scared to eat, because she is afraid  she is going to choke and die. Patient is asking for recommendations.  Message routed to [place] 10/21/19-    1. Acute back pain, unspecified back location, unspecified back pain laterality  Continue to work with primary care  Contact your office next week if you have not received your lab results or x-ray imaging results  2. IPF (idiopathic pulmonary fibrosis) ([place])  Unfortunately you are intolerant of both of the antifibrotic's that we have placed you on for management of your fibrosis  We will continue to monitor your fibrosis clinically   Plan to complete your spirometry with DLCO in June/2021 as ordered  Follow-up with our office in June/2021    Follow Up:    Return in about 4 months (around 02/18/2020), or if symptoms worsen or fail to improve, for Follow up with Dr. Purnell Shoemaker.

## 2019-10-28 DIAGNOSIS — M542 Cervicalgia: Secondary | ICD-10-CM | POA: Diagnosis not present

## 2019-11-01 DIAGNOSIS — M542 Cervicalgia: Secondary | ICD-10-CM | POA: Diagnosis not present

## 2019-11-05 DIAGNOSIS — Z96649 Presence of unspecified artificial hip joint: Secondary | ICD-10-CM | POA: Diagnosis not present

## 2019-11-05 DIAGNOSIS — J849 Interstitial pulmonary disease, unspecified: Secondary | ICD-10-CM | POA: Diagnosis not present

## 2019-11-05 DIAGNOSIS — M1611 Unilateral primary osteoarthritis, right hip: Secondary | ICD-10-CM | POA: Diagnosis not present

## 2019-11-05 DIAGNOSIS — R0902 Hypoxemia: Secondary | ICD-10-CM | POA: Diagnosis not present

## 2019-11-08 DIAGNOSIS — M542 Cervicalgia: Secondary | ICD-10-CM | POA: Diagnosis not present

## 2019-11-09 ENCOUNTER — Other Ambulatory Visit: Payer: Self-pay | Admitting: Cardiology

## 2019-11-09 DIAGNOSIS — I272 Pulmonary hypertension, unspecified: Secondary | ICD-10-CM

## 2019-11-09 DIAGNOSIS — R0602 Shortness of breath: Secondary | ICD-10-CM

## 2019-11-10 ENCOUNTER — Other Ambulatory Visit: Payer: Self-pay

## 2019-11-10 ENCOUNTER — Ambulatory Visit (INDEPENDENT_AMBULATORY_CARE_PROVIDER_SITE_OTHER): Payer: Medicare PPO

## 2019-11-10 DIAGNOSIS — R0602 Shortness of breath: Secondary | ICD-10-CM

## 2019-11-10 DIAGNOSIS — I272 Pulmonary hypertension, unspecified: Secondary | ICD-10-CM

## 2019-11-11 ENCOUNTER — Telehealth: Payer: Self-pay | Admitting: *Deleted

## 2019-11-11 NOTE — Telephone Encounter (Signed)
-----  Message from Satira Sark, MD sent at 11/10/2019 12:48 PM EDT ----- Results reviewed.  From a cardiac perspective, she has vigorous cardiac function with LVEF 70 to 76%, mild diastolic dysfunction which would be expected at this point.  She does have evidence of moderate pulmonary hypertension in association with her lung disease, low normal RV contraction.  This would go along with findings of an enlarged pulmonary artery by recent chest CT.  I am forwarding these results to her pulmonologist.  Can schedule a follow-up cardiac visit in 6 months for review of coronary artery calcification and medical therapy.

## 2019-11-11 NOTE — Telephone Encounter (Signed)
Patient informed and verbalized understanding of plan. Copy sent to PCP 

## 2019-11-18 ENCOUNTER — Other Ambulatory Visit: Payer: Self-pay | Admitting: Nurse Practitioner

## 2019-11-18 DIAGNOSIS — K219 Gastro-esophageal reflux disease without esophagitis: Secondary | ICD-10-CM

## 2019-11-23 DIAGNOSIS — I10 Essential (primary) hypertension: Secondary | ICD-10-CM | POA: Diagnosis not present

## 2019-11-23 DIAGNOSIS — E785 Hyperlipidemia, unspecified: Secondary | ICD-10-CM | POA: Diagnosis not present

## 2019-11-28 NOTE — Progress Notes (Signed)
Referring Provider: Glenda Chroman, MD Primary Care Physician:  Glenda Chroman, MD  Primary GI: Dr. Oneida Alar   Chief Complaint  Patient presents with  . pp f/u    doing ok    HPI:   Ashlee Martin is an 86 y.o. female presenting today with a history of chronic N/V, dysphagia, intermittent diarrhea while on OFEV. GES normal. Outside imaging Sept 2020 CT without contrast with thickening of stomach fundus at GE junction. EGD with Propofol undertaken Oct 2020 with mild gastritis, findings on CT likely due to sliding hiatal hernia, benign-appearing esophageal stricture due to GERD. Colonoscopy normal TI, multiple small and large-mouthed diverticula in recto-sgimoid, sigmoid, transverse. Chronic macrocytic anemia. States this is followed by PCP.   Weight loss improved: previously in the 120s late October and now improved near baseline in the 140s. Likes to keep her weight around 134.   Gets a tickle in her throat at times. Gets short of breath more. Off OFEV. Diarrhea resolved. Worsening dyspnea on exertion. N/V resolved. Not taking Protonix any longer.   Past Medical History:  Diagnosis Date  . Arthritis   . Coronary artery calcification seen on CT scan   . Depression   . Essential hypertension   . GERD (gastroesophageal reflux disease)   . History of seizures   . Hypercholesterolemia   . ILD (interstitial lung disease) ([place])   . Psoriasis    Had been on Humira but continued to have UTIs, sinus infections, etc. Taken off.    Past Surgical History:  Procedure Laterality Date  . BIOPSY  04/20/2018   Procedure: BIOPSY;  Surgeon: Danie Binder, MD;  Location: [facility];  Service: Endoscopy;;  gastric  . COLONOSCOPY  01/2011   Dr. Anthony Sar: normal.   . COLONOSCOPY WITH PROPOFOL N/A 05/31/2019   normal TI, multiple small and large-mouthed diverticula in recto-sgimoid, sigmoid, transverse  . DILATION AND CURETTAGE OF UTERUS     x3  . ESOPHAGOGASTRODUODENOSCOPY (EGD) WITH  PROPOFOL N/A 04/20/2018   benign-appearing, intrinsic mild stenosis, s/p dilation. Mild inflammation entire stomach, s/p biopsy that was negative for H.pylori. Pylorus normal but had changes consistent with PRIOR ulcer disease. Duodenum normal.   . ESOPHAGOGASTRODUODENOSCOPY (EGD) WITH PROPOFOL N/A 05/31/2019   mild gastirits, findings on CT likely due to sliding hiatal hernia, benign-appearing esophageal stricture due to GERD.  Marland Kitchen SAVORY DILATION N/A 04/20/2018   Procedure: SAVORY DILATION;  Surgeon: Danie Binder, MD;  Location: [facility];  Service: Endoscopy;  Laterality: N/A;  . VIDEO BRONCHOSCOPY Bilateral 01/07/2018   Procedure: VIDEO BRONCHOSCOPY WITHOUT FLUORO;  Surgeon: Brand Males, MD;  Location: [facility];  Service: Cardiopulmonary;  Laterality: Bilateral;  . WRIST SURGERY Right    ORIF    Current Outpatient Medications  Medication Sig Dispense Refill  . aspirin EC 81 MG tablet Take 1 tablet (81 mg total) by mouth daily. 90 tablet 3  . atenolol (TENORMIN) 25 MG tablet Take 25 mg by mouth 2 (two) times daily.     Marland Kitchen BIOTIN PO Take by mouth.    . Cholecalciferol (VITAMIN D-3) 125 MCG (5000 UT) TABS Take by mouth daily.    . diphenhydrAMINE HCl, Sleep, (SLEEP AID, DIPHENHYDRAMINE, PO) Take 50 mg by mouth as needed.    . fexofenadine (ALLEGRA) 180 MG tablet Take 180 mg by mouth 2 (two) times daily.    Marland Kitchen lactobacillus acidophilus (BACID) TABS tablet Take 1 tablet by mouth daily.    Marland Kitchen  loratadine (CLARITIN) 10 MG tablet Take 10 mg by mouth at bedtime.    . mirtazapine (REMERON) 7.5 MG tablet Take 7.5 mg by mouth at bedtime.    . Naproxen Sod-diphenhydrAMINE (ALEVE PM PO) Take by mouth as needed.    Marland Kitchen Phenylephrine-APAP-guaiFENesin (MUCINEX SINUS-MAX CONGESTION PO) Take by mouth daily.    . potassium chloride (K-DUR) 10 MEQ tablet Take 10 mEq by mouth daily.     . simvastatin (ZOCOR) 20 MG tablet Take 20 mg by mouth at bedtime.     . vitamin B-12 (CYANOCOBALAMIN) 500 MCG tablet  Take 500 mcg by mouth daily.    . pantoprazole (PROTONIX) 40 MG tablet TAKE 1 TABLET (40 MG TOTAL) BY MOUTH 2 (TWO) TIMES DAILY BEFORE A MEAL. (Patient not taking: Reported on 11/29/2019) 180 tablet 1   No current facility-administered medications for this visit.    Allergies as of 11/29/2019 - Review Complete 11/29/2019  Allergen Reaction Noted  . Humira [adalimumab] Other (See Comments) 11/03/2017  . Pirfenidone Other (See Comments) 04/27/2018    Family History  Problem Relation Age of Onset  . Heart disease Mother   . Heart disease Father   . Cancer Sister        GYN  . Colon cancer Neg Hx     Social History   Socioeconomic History  . Marital status: Married    Spouse name: Not on file  . Number of children: Not on file  . Years of education: Not on file  . Highest education level: Not on file  Occupational History  . Occupation: Retired  Tobacco Use  . Smoking status: Former Smoker    Packs/day: 3.00    Years: 20.00    Pack years: 60.00    Types: Cigarettes    Quit date: 11/03/1972    Years since quitting: 47.1  . Smokeless tobacco: Never Used  Substance and Sexual Activity  . Alcohol use: Yes    Alcohol/week: 1.0 - 2.0 standard drinks    Types: 1 - 2 Shots of liquor per week    Comment: May have before bed  . Drug use: Never  . Sexual activity: Yes    Birth control/protection: None  Other Topics Concern  . Not on file  Social History Narrative  . Not on file   Social Determinants of Health   Financial Resource Strain:   . Difficulty of Paying Living Expenses:   Food Insecurity:   . Worried About Charity fundraiser in the Last Year:   . Arboriculturist in the Last Year:   Transportation Needs:   . Film/video editor (Medical):   Marland Kitchen Lack of Transportation (Non-Medical):   Physical Activity:   . Days of Exercise per Week:   . Minutes of Exercise per Session:   Stress:   . Feeling of Stress :   Social Connections:   . Frequency of Communication  with Friends and Family:   . Frequency of Social Gatherings with Friends and Family:   . Attends Religious Services:   . Active Member of Clubs or Organizations:   . Attends Archivist Meetings:   Marland Kitchen Marital Status:     Review of Systems: Gen: Denies fever, chills, anorexia. Denies fatigue, weakness, weight loss.  CV: Denies chest pain, palpitations, syncope, peripheral edema, and claudication. Resp: see HPI GI: Denies vomiting blood, jaundice, and fecal incontinence.   Denies dysphagia or odynophagia. Derm: Denies rash, itching, dry skin Psych: Denies depression, anxiety, memory  loss, confusion. No homicidal or suicidal ideation.  Heme: Denies bruising, bleeding, and enlarged lymph nodes.  Physical Exam: BP 128/79   Pulse 73   Temp (!) 96.9 F (36.1 C) (Temporal)   Ht _0  (1.651 m)   Wt 142 lb 9.6 oz (64.7 kg)   BMI 23.73 kg/m  General:   Alert and oriented. No distress noted. Pleasant and cooperative.  Head:  Normocephalic and atraumatic. Eyes:  Conjuctiva clear without scleral icterus. Mouth:  Mask in place Abdomen:  +BS, soft, non-tender and non-distended. No rebound or guarding. No HSM or masses noted. Msk:  Symmetrical without gross deformities. Normal posture. Extremities:  Without edema. Neurologic:  Alert and  oriented x4 Psych:  Alert and cooperative. Normal mood and affect.  ASSESSMENT: Ashlee Martin is an 86 y.o. female with history of N/V, GERD, dysphagia, intermittent diarrhea, weight loss, now returning for routine follow-up and much improved.   N/V: resolved. Likely med effect (OFEV). GES normal. EGD on file from Oct 2020 with mild gastritis, benign-appearing esophageal stricture due to GERD, sliding hiatal hernia.   Dysphagia: resolved. Needs to resume PPI once daily.   Intermittent diarrhea: med related. Now off OFEV and doing well. Colonoscopy up-to-date.   Weight loss: now gaining weight again. Now that N/V/D has resolved, she is doing  well.    PLAN:  Resume PPI at once daily  Call if recurrent N/V/D  Return in 6 months  Annitta Needs, PhD, [facility]

## 2019-11-29 ENCOUNTER — Ambulatory Visit (INDEPENDENT_AMBULATORY_CARE_PROVIDER_SITE_OTHER): Payer: Medicare PPO | Admitting: Gastroenterology

## 2019-11-29 ENCOUNTER — Encounter: Payer: Self-pay | Admitting: Gastroenterology

## 2019-11-29 ENCOUNTER — Ambulatory Visit (INDEPENDENT_AMBULATORY_CARE_PROVIDER_SITE_OTHER): Payer: Medicare PPO | Admitting: Pulmonary Disease

## 2019-11-29 ENCOUNTER — Encounter: Payer: Self-pay | Admitting: Pulmonary Disease

## 2019-11-29 ENCOUNTER — Other Ambulatory Visit: Payer: Self-pay

## 2019-11-29 VITALS — BP 128/79 | HR 73 | Temp 96.9°F | Ht 65.0 in | Wt 142.6 lb

## 2019-11-29 DIAGNOSIS — J84112 Idiopathic pulmonary fibrosis: Secondary | ICD-10-CM | POA: Diagnosis not present

## 2019-11-29 DIAGNOSIS — K219 Gastro-esophageal reflux disease without esophagitis: Secondary | ICD-10-CM

## 2019-11-29 DIAGNOSIS — J301 Allergic rhinitis due to pollen: Secondary | ICD-10-CM | POA: Diagnosis not present

## 2019-11-29 DIAGNOSIS — K529 Noninfective gastroenteritis and colitis, unspecified: Secondary | ICD-10-CM | POA: Diagnosis not present

## 2019-11-29 MED ORDER — AZELASTINE HCL 0.1 % NA SOLN: 1.0000 | Freq: Two times a day (BID) | NASAL | 12 refills | Status: DC

## 2019-11-29 NOTE — Assessment & Plan Note (Signed)
Likely AR flare  We do not recommend [place] salt inhaler, does not believe that this is been supported with research  Plan: Start nasal saline rinses twice daily Use Astelin nasal spray Continue Claritin

## 2019-11-29 NOTE — Progress Notes (Signed)
CC'ED TO PCP

## 2019-11-29 NOTE — Assessment & Plan Note (Signed)
Intolerant of antifibrotic's  Plan: Keep follow-up with Dr. Chase Caller in June/2021 Complete spirometry with DLCO in June/2021 as ordered

## 2019-11-29 NOTE — Patient Instructions (Signed)
Just take pantoprazole (Protonix) once per day, 30 minutes before breakfast.   Please call if any problems swallowing, abdominal pain, nausea, vomiting, or recurrent diarrhea.  We will see you in 6 months!  I enjoyed seeing you again today! As you know, I value our relationship and want to provide genuine, compassionate, and quality care. I welcome your feedback. If you receive a survey regarding your visit,  I greatly appreciate you taking time to fill this out. See you next time!  Annitta Needs, PhD, ANP-BC [facility]

## 2019-11-29 NOTE — Progress Notes (Signed)
Virtual Visit via Telephone Note  I connected with Ashlee Martin on 11/29/19 at  4:00 PM EDT by telephone and verified that I am speaking with the correct person using two identifiers.  Location: Patient: Home Provider: Office [facility] - [address], [place], [place], [place] [address]   I discussed the limitations, risks, security and privacy concerns of performing an evaluation and management service by telephone and the availability of in person appointments. I also discussed with the patient that there may be a patient responsible charge related to this service. The patient expressed understanding and agreed to proceed.  Patient consented to consult via telephone: Yes People present and their role in pt care: Pt     History of Present Illness:  86 year old female former smoker followed in our office for idiopathic pulmonary fibrosis (failed Esbriet and 0FEV)  PMH: Nausea vomiting, GERD, UTI, chronic diarrhea Smoker/ Smoking History: Former smoker. Quit 1974. 60-pack-year smoking history Maintenance: none Pt of: Dr. Chase Caller    Chief complaint: tickle in the back of your throat   86 year old female followed in our office for IPF.  Patient scheduled for televisit today to review some increased nasal congestion as well as a tickle in the back of her throat.  Patient feels that she is been more short of breath over the last 2 to 3 weeks since developing the symptoms.  She reports that she started using a [place] salt inhaler and she feels that this is helped with her breathing.  She has known allergic rhinitis.  She is not currently using any nasal medications or nasal saline rinses.  She does remain adherent to her Claritin.  She is also wondering if she would benefit from taking "Dr. Talmage Coin respiratory pills" which she saw online.  We will discuss this today.  Observations/Objective:   06/15/18 -CT chest high-res- previously noted left [place] lobe nodules  completely resolved, chronic changes of interstitial lung disease again considered probable UIP  09/07/2019 - CT Chest High Res - pulmonary parenchymal pattern of fibrosis is unchanged from 11/13/2017 is likely due to UIP, categorized as probable UIP marginal irregularity of the liver is indicative of cirrhosis, aortic arthrosclerosis, enlarged pulmonary trunk indicative of PAH    Social History   Tobacco Use  Smoking Status Former Smoker  . Packs/day: 3.00  . Years: 20.00  . Pack years: 60.00  . Types: Cigarettes  . Quit date: 11/03/1972  . Years since quitting: 47.1  Smokeless Tobacco Never Used   Immunization History  Administered Date(s) Administered  . Fluad Quad(high Dose 65+) 05/10/2019  . Influenza, High Dose Seasonal PF 06/10/2017, 04/27/2018  . Pneumococcal Conjugate-13 12/09/2014, 09/05/2018      Assessment and Plan:  Allergic rhinitis Likely AR flare  We do not recommend [place] salt inhaler, does not believe that this is been supported with research  Plan: Start nasal saline rinses twice daily Use Astelin nasal spray Continue Claritin  IPF (idiopathic pulmonary fibrosis) ([place]) Intolerant of antifibrotic's  Plan: Keep follow-up with Dr. Chase Caller in June/2021 Complete spirometry with DLCO in June/2021 as ordered   Follow Up Instructions:  No follow-ups on file.   I discussed the assessment and treatment plan with the patient. The patient was provided an opportunity to ask questions and all were answered. The patient agreed with the plan and demonstrated an understanding of the instructions.   The patient was advised to call back or seek an in-person evaluation if the symptoms worsen or if the  condition fails to improve as anticipated.  I provided 24 minutes of non-face-to-face time during this encounter.   Lauraine Rinne, NP

## 2019-11-29 NOTE — Patient Instructions (Addendum)
You were seen today by Lauraine Rinne, NP  for:  1. IPF (idiopathic pulmonary fibrosis) ([place])  Keep follow-up with our office in June/2021 with a breathing test  2. Seasonal allergic rhinitis due to pollen  - azelastine (ASTELIN) 0.1 % nasal spray; Place 1 spray into both nostrils 2 (two) times daily. Use in each nostril as directed  Dispense: 30 mL; Refill: 12  We do not recommend use of your Himalayan salt inhaler We do not recommend Dr. Theophilus Kinds respiratory pills  You can start doing nasal saline rinses twice daily Use distilled water Get lukewarm like a baby bottle Shake well You can do these twice daily prior to your Astelin nasal spray  Continue Claritin  We recommend today:   Meds ordered this encounter  Medications  . azelastine (ASTELIN) 0.1 % nasal spray    Sig: Place 1 spray into both nostrils 2 (two) times daily. Use in each nostril as directed    Dispense:  30 mL    Refill:  12    Follow Up:    Return in about 2 months (around 01/29/2020), or if symptoms worsen or fail to improve, for Follow up with Dr. Purnell Shoemaker, Follow up for PFT.   Please do your part to reduce the spread of COVID-19:      Reduce your risk of any infection  and COVID19 by using the similar precautions used for avoiding the common cold or flu:  Marland Kitchen Wash your hands often with soap and warm water for at least 20 seconds.  If soap and water are not readily available, use an alcohol-based hand sanitizer with at least 60% alcohol.  . If coughing or sneezing, cover your mouth and nose by coughing or sneezing into the elbow areas of your shirt or coat, into a tissue or into your sleeve (not your hands). Langley Gauss A MASK when in public  . Avoid shaking hands with others and consider head nods or verbal greetings only. . Avoid touching your eyes, nose, or mouth with unwashed hands.  . Avoid close contact with people who are sick. . Avoid places or events with large numbers of people in one location,  like concerts or sporting events. . If you have some symptoms but not all symptoms, continue to monitor at home and seek medical attention if your symptoms worsen. . If you are having a medical emergency, call 911.   [place] / e-Visit: [email or web address]         [facility]: [facility]: [phone]                   [facility]: [phone]     It is flu season:   >>> Best ways to protect herself from the flu: Receive the yearly flu vaccine, practice good hand hygiene washing with soap and also using hand sanitizer when available, eat a nutritious meals, get adequate rest, hydrate appropriately   Please contact the office if your symptoms worsen or you have concerns that you are not improving.   Thank you for choosing [facility] for your healthcare, and for allowing [place] to partner with you on your healthcare journey. I am thankful to be able to provide care to you today.   Wyn Quaker FNP-C

## 2019-12-06 DIAGNOSIS — J849 Interstitial pulmonary disease, unspecified: Secondary | ICD-10-CM | POA: Diagnosis not present

## 2019-12-06 DIAGNOSIS — R0902 Hypoxemia: Secondary | ICD-10-CM | POA: Diagnosis not present

## 2019-12-06 DIAGNOSIS — Z96649 Presence of unspecified artificial hip joint: Secondary | ICD-10-CM | POA: Diagnosis not present

## 2019-12-06 DIAGNOSIS — M1611 Unilateral primary osteoarthritis, right hip: Secondary | ICD-10-CM | POA: Diagnosis not present

## 2019-12-14 DIAGNOSIS — I10 Essential (primary) hypertension: Secondary | ICD-10-CM | POA: Diagnosis not present

## 2019-12-14 DIAGNOSIS — F322 Major depressive disorder, single episode, severe without psychotic features: Secondary | ICD-10-CM | POA: Diagnosis not present

## 2019-12-14 DIAGNOSIS — R935 Abnormal findings on diagnostic imaging of other abdominal regions, including retroperitoneum: Secondary | ICD-10-CM | POA: Diagnosis not present

## 2019-12-14 DIAGNOSIS — Z299 Encounter for prophylactic measures, unspecified: Secondary | ICD-10-CM | POA: Diagnosis not present

## 2019-12-14 DIAGNOSIS — N390 Urinary tract infection, site not specified: Secondary | ICD-10-CM | POA: Diagnosis not present

## 2019-12-14 DIAGNOSIS — R3915 Urgency of urination: Secondary | ICD-10-CM | POA: Diagnosis not present

## 2019-12-16 ENCOUNTER — Telehealth: Payer: Self-pay | Admitting: Cardiology

## 2019-12-16 DIAGNOSIS — R0902 Hypoxemia: Secondary | ICD-10-CM | POA: Diagnosis not present

## 2019-12-16 DIAGNOSIS — M1990 Unspecified osteoarthritis, unspecified site: Secondary | ICD-10-CM | POA: Diagnosis not present

## 2019-12-16 DIAGNOSIS — R52 Pain, unspecified: Secondary | ICD-10-CM | POA: Diagnosis not present

## 2019-12-16 DIAGNOSIS — J8410 Pulmonary fibrosis, unspecified: Secondary | ICD-10-CM | POA: Diagnosis not present

## 2019-12-16 DIAGNOSIS — J441 Chronic obstructive pulmonary disease with (acute) exacerbation: Secondary | ICD-10-CM | POA: Diagnosis not present

## 2019-12-16 DIAGNOSIS — Z79899 Other long term (current) drug therapy: Secondary | ICD-10-CM | POA: Diagnosis not present

## 2019-12-16 DIAGNOSIS — R0689 Other abnormalities of breathing: Secondary | ICD-10-CM | POA: Diagnosis not present

## 2019-12-16 DIAGNOSIS — Z888 Allergy status to other drugs, medicaments and biological substances status: Secondary | ICD-10-CM | POA: Diagnosis not present

## 2019-12-16 DIAGNOSIS — R0602 Shortness of breath: Secondary | ICD-10-CM | POA: Diagnosis not present

## 2019-12-16 DIAGNOSIS — I10 Essential (primary) hypertension: Secondary | ICD-10-CM | POA: Diagnosis not present

## 2019-12-16 DIAGNOSIS — Z87891 Personal history of nicotine dependence: Secondary | ICD-10-CM | POA: Diagnosis not present

## 2019-12-16 NOTE — Telephone Encounter (Signed)
Husband reports that O2 sat in low 70's on 3L O2 via [place]. Denies chest pain or dizziness. Says he called pulmonologist office but was unable to get them on the phone. Advised that patient needed to go to the ED now for an evaluation.  Husband says that he nor his wife were able to drive. Advised husband to call 911 now. Verbalized understanding.

## 2019-12-16 NOTE — Telephone Encounter (Signed)
Husband called stating that his wife is having labor.

## 2019-12-16 NOTE — Telephone Encounter (Signed)
Agree.  Thank you for letting me know.

## 2020-01-02 ENCOUNTER — Telehealth: Payer: Self-pay | Admitting: Internal Medicine

## 2020-01-02 NOTE — Telephone Encounter (Signed)
Seen by Aaron Edelman in April  Please ensure fu in June with or without spiro/dlco

## 2020-01-05 DIAGNOSIS — Z96649 Presence of unspecified artificial hip joint: Secondary | ICD-10-CM | POA: Diagnosis not present

## 2020-01-05 DIAGNOSIS — J849 Interstitial pulmonary disease, unspecified: Secondary | ICD-10-CM | POA: Diagnosis not present

## 2020-01-05 DIAGNOSIS — R0902 Hypoxemia: Secondary | ICD-10-CM | POA: Diagnosis not present

## 2020-01-05 DIAGNOSIS — M1611 Unilateral primary osteoarthritis, right hip: Secondary | ICD-10-CM | POA: Diagnosis not present

## 2020-01-05 NOTE — Telephone Encounter (Signed)
Called and spoke with pt about message from MR. Pt has been scheduled appt with him 6/3. Nothing further needed.

## 2020-01-23 DIAGNOSIS — I10 Essential (primary) hypertension: Secondary | ICD-10-CM | POA: Diagnosis not present

## 2020-01-23 DIAGNOSIS — J40 Bronchitis, not specified as acute or chronic: Secondary | ICD-10-CM | POA: Diagnosis not present

## 2020-01-24 ENCOUNTER — Other Ambulatory Visit: Payer: Self-pay

## 2020-01-24 ENCOUNTER — Ambulatory Visit (INDEPENDENT_AMBULATORY_CARE_PROVIDER_SITE_OTHER): Payer: Medicare PPO | Admitting: Primary Care

## 2020-01-24 ENCOUNTER — Encounter: Payer: Self-pay | Admitting: Primary Care

## 2020-01-24 VITALS — BP 126/74 | HR 74 | Temp 97.6°F | Ht 65.0 in | Wt 146.8 lb

## 2020-01-24 DIAGNOSIS — J84112 Idiopathic pulmonary fibrosis: Secondary | ICD-10-CM

## 2020-01-24 DIAGNOSIS — J9611 Chronic respiratory failure with hypoxia: Secondary | ICD-10-CM | POA: Diagnosis not present

## 2020-01-24 MED ORDER — SODIUM CHLORIDE 3 % IN NEBU: INHALATION_SOLUTION | Freq: Two times a day (BID) | RESPIRATORY_TRACT | 3 refills | Status: AC

## 2020-01-24 NOTE — Patient Instructions (Addendum)
Orders: Spirometry with DLCO ([place])  Rx: Hypertonic saline nebulizer twice daily  Recommendations: Use flutter valve 5-10 breaths three times a day followed by coughing exercises   Follow-up: 3 months with Dr. Chase Caller in ILD clinic (30 min slot)

## 2020-01-24 NOTE — Progress Notes (Signed)
_0  ID: Ashlee Martin, female    DOB: Sep 22, 1930, 86 y.o.   MRN: [id number]  Chief Complaint  Patient presents with  . Follow-up    pt  is here for sob pt states she been using nasal spray but still has sob at all times    Referring provider: Glenda Chroman, MD  HPI: 86 year old female, former smoker (60 pack year hx). PMH significant for IPF, acute respiratory failure, allergic rhinitis, chronic diarrhea, GERD, psoriasis. [name] Dr. Chase Caller. CT chest that showed fibrotic interstitial lung disease with mild honeycombing.  Autoimmune panel was essentially negative except for SS B IgG antibody.  And sed rate was mildly elevated at 34. Intolerant to anti-fibrotics. Previously on Humira in 2016 but stopped d/t rash.  01/24/2020 Patient presents today for follow-up. She continues to experience shortness of breath. She has an intermittent productive cough in the morning which is normally  white/clear mucus but sometimes green. She uses 3L continuous oxygen at home. She spent 6 hours ED in [place] for hypoxemic, states that her O2 was 71% at home on 3L. She had an EKG and CXR. States that everything came back normal. Her symptoms eventually resolved with breathing treatment.   States that she experiences fatigue around 3pm, reports getting out of breath a lot. She has been on both Esbriet and Ofev which she did not tolerated d/t vomiting, diarrhea and weight loss. States that she will boil water with two cups of sea salt which helps with her cough. She is interested in discussing vitamins/supplements for IPF. She takes vitamin D and B12.    SYMPTOM SCALE - ILD 06/20/2019  01/24/2020   O2 use 2-3L 2-3  Shortness of Breath 0 -> 5 scale with 5 being worst (score 6 If unable to do)   At rest 0 0  Simple tasks - showers, clothes change, eating, shaving 0 2  Household (dishes, doing bed, laundry) 0 2  Shopping 3 3  Walking level at own pace 0 3  Walking keeping up with others of same age 46  3  Walking up Stairs na na  Walking up [employer] na  Total (40 - 48) Dyspnea Score x x  How bad is your cough? 0 3  How bad is your fatigue 2 3.5       Imaging: 06/15/18 -CT chest high-res- previously noted left Laurich lobe nodules completely resolved, chronic changes of interstitial lung disease again considered probable UIP  09/07/2019 - CT Chest High Res - pulmonary parenchymal pattern of fibrosis is unchanged from 11/13/2017 is likely due to UIP, categorized as probable UIP marginal irregularity of the liver is indicative of cirrhosis, aortic arthrosclerosis, enlarged pulmonary trunk indicative of PAH  Pulmonary toxicity history: Other than exposure to Humira and short-term prednisone no other pulmonary toxicity history including nitrofurantoin or BCG or amiodarone or chemotherapy   Allergies  Allergen Reactions  . Humira [Adalimumab] Other (See Comments)    Worsened your psoriasis.  . Pirfenidone Other (See Comments)    Fatigue and weight loss    Immunization History  Administered Date(s) Administered  . Fluad Quad(high Dose 65+) 05/10/2019  . Influenza, High Dose Seasonal PF 06/10/2017, 04/27/2018  . Moderna SARS-COVID-2 Vaccination 11/11/2019, 12/12/2019  . Pneumococcal Conjugate-13 12/09/2014, 09/05/2018    Past Medical History:  Diagnosis Date  . Arthritis   . Coronary artery calcification seen on CT scan   . Depression   . Essential hypertension   . GERD (gastroesophageal reflux  disease)   . History of seizures   . Hypercholesterolemia   . ILD (interstitial lung disease) ([place])   . Psoriasis    Had been on Humira but continued to have UTIs, sinus infections, etc. Taken off.    Tobacco History: Social History   Tobacco Use  Smoking Status Former Smoker  . Packs/day: 3.00  . Years: 20.00  . Pack years: 60.00  . Types: Cigarettes  . Quit date: 11/03/1972  . Years since quitting: 47.2  Smokeless Tobacco Never Used   Counseling given: Not  Answered   Outpatient Medications Prior to Visit  Medication Sig Dispense Refill  . aspirin EC 81 MG tablet Take 1 tablet (81 mg total) by mouth daily. 90 tablet 3  . atenolol (TENORMIN) 25 MG tablet Take 25 mg by mouth 2 (two) times daily.     Marland Kitchen azelastine (ASTELIN) 0.1 % nasal spray Place 1 spray into both nostrils 2 (two) times daily. Use in each nostril as directed 30 mL 12  . BIOTIN PO Take by mouth.    . Cholecalciferol (VITAMIN D-3) 125 MCG (5000 UT) TABS Take by mouth daily.    . diphenhydrAMINE HCl, Sleep, (SLEEP AID, DIPHENHYDRAMINE, PO) Take 50 mg by mouth as needed.    . fexofenadine (ALLEGRA) 180 MG tablet Take 180 mg by mouth 2 (two) times daily.    Marland Kitchen lactobacillus acidophilus (BACID) TABS tablet Take 1 tablet by mouth daily.    Marland Kitchen loratadine (CLARITIN) 10 MG tablet Take 10 mg by mouth at bedtime.    . mirtazapine (REMERON) 7.5 MG tablet Take 7.5 mg by mouth at bedtime.    . Naproxen Sod-diphenhydrAMINE (ALEVE PM PO) Take by mouth as needed.    . pantoprazole (PROTONIX) 40 MG tablet TAKE 1 TABLET (40 MG TOTAL) BY MOUTH 2 (TWO) TIMES DAILY BEFORE A MEAL. 180 tablet 1  . Phenylephrine-APAP-guaiFENesin (MUCINEX SINUS-MAX CONGESTION PO) Take by mouth daily.    . potassium chloride (K-DUR) 10 MEQ tablet Take 10 mEq by mouth daily.     . simvastatin (ZOCOR) 20 MG tablet Take 20 mg by mouth at bedtime.     . vitamin B-12 (CYANOCOBALAMIN) 500 MCG tablet Take 500 mcg by mouth daily.     No facility-administered medications prior to visit.   Review of Systems  Review of Systems  Constitutional: Positive for fatigue.  Respiratory: Positive for cough and shortness of breath.    Physical Exam  BP 126/74 (BP Location: Left Arm, Cuff Size: Normal)   Pulse 74   Temp 97.6 F (36.4 C) (Oral)   Ht _0  (1.651 m)   Wt 146 lb 12.8 oz (66.6 kg)   SpO2 92%   BMI 24.43 kg/m  Physical Exam Constitutional:      General: She is not in acute distress. Cardiovascular:     Rate and  Rhythm: Normal rate and regular rhythm.  Pulmonary:     Effort: Pulmonary effort is normal.     Breath sounds: Rales present.  Neurological:     General: No focal deficit present.     Mental Status: She is alert. Mental status is at baseline.  Psychiatric:        Mood and Affect: Mood normal.        Behavior: Behavior normal.        Thought Content: Thought content normal.      Lab Results:  CBC    Component Value Date/Time   WBC 7.6 09/07/2019 1341   RBC  3.23 (L) 09/07/2019 1341   HGB 11.6 (L) 09/07/2019 1341   HCT 36.3 09/07/2019 1341   PLT 285 09/07/2019 1341   MCV 112.4 (H) 09/07/2019 1341   MCH 35.9 (H) 09/07/2019 1341   MCHC 32.0 09/07/2019 1341   RDW 13.2 09/07/2019 1341   LYMPHSABS 2.0 09/07/2019 1341   MONOABS 0.7 09/07/2019 1341   EOSABS 0.2 09/07/2019 1341   BASOSABS 0.1 09/07/2019 1341    BMET    Component Value Date/Time   NA 135 05/27/2019 1004   K 4.5 05/27/2019 1004   CL 100 05/27/2019 1004   CO2 26 05/27/2019 1004   GLUCOSE 93 05/27/2019 1004   BUN 12 05/27/2019 1004   CREATININE 1.03 (H) 05/27/2019 1004   CALCIUM 9.1 05/27/2019 1004   GFRNONAA 50 (L) 05/27/2019 1004   GFRAA 57 (L) 05/27/2019 1004    BNP No results found for: BNP  ProBNP No results found for: PROBNP  Imaging: No results found.   Assessment & Plan:   IPF (idiopathic pulmonary fibrosis) ([place]) - She has a chronic productive cough with increased symptoms of dyspnea and fatigue. Intolerant to anti-fibrotics d/t diarrhea and weight loss - Recommend using flutter valve 5-10 breaths three times a day followed by coughing exercises  - Orders: Spirometry with DLCO ([place]) - Rx: Hypertonic saline nebulizer twice daily for chest congestion - Follow-up: 3 months with Dr. Chase Caller in ILD clinic (30 min slot) to discuss research trial and potential supplements    Martyn Ehrich, NP 01/30/2020

## 2020-01-26 ENCOUNTER — Ambulatory Visit: Payer: Medicare PPO | Admitting: Internal Medicine

## 2020-01-30 NOTE — Assessment & Plan Note (Signed)
-  She has a chronic productive cough with increased symptoms of dyspnea and fatigue. Intolerant to anti-fibrotics d/t diarrhea and weight loss - Recommend using flutter valve 5-10 breaths three times a day followed by coughing exercises  - Orders: Spirometry with DLCO ([place]) - Rx: Hypertonic saline nebulizer twice daily for chest congestion - Follow-up: 3 months with Dr. Chase Caller in ILD clinic (30 min slot) to discuss research trial and potential supplements

## 2020-02-05 DIAGNOSIS — R0902 Hypoxemia: Secondary | ICD-10-CM | POA: Diagnosis not present

## 2020-02-05 DIAGNOSIS — J849 Interstitial pulmonary disease, unspecified: Secondary | ICD-10-CM | POA: Diagnosis not present

## 2020-02-05 DIAGNOSIS — M1611 Unilateral primary osteoarthritis, right hip: Secondary | ICD-10-CM | POA: Diagnosis not present

## 2020-02-05 DIAGNOSIS — Z96649 Presence of unspecified artificial hip joint: Secondary | ICD-10-CM | POA: Diagnosis not present

## 2020-02-16 DIAGNOSIS — E785 Hyperlipidemia, unspecified: Secondary | ICD-10-CM | POA: Diagnosis not present

## 2020-02-16 DIAGNOSIS — K219 Gastro-esophageal reflux disease without esophagitis: Secondary | ICD-10-CM | POA: Diagnosis not present

## 2020-02-16 DIAGNOSIS — J8410 Pulmonary fibrosis, unspecified: Secondary | ICD-10-CM | POA: Diagnosis not present

## 2020-02-16 DIAGNOSIS — Z9981 Dependence on supplemental oxygen: Secondary | ICD-10-CM | POA: Diagnosis not present

## 2020-02-21 ENCOUNTER — Telehealth: Payer: Self-pay | Admitting: Internal Medicine

## 2020-02-21 NOTE — Telephone Encounter (Signed)
[phone] PLEASE CALL PATIENT, HAS BEEN HAVING VOMITING EPISODES, BEEN GETTING WORSE.

## 2020-02-21 NOTE — Telephone Encounter (Signed)
Pt states this has been going on for about 3 weeks and that she has vomited twice today.She states she feels fine and denies fever, chills or diarrhea or bloody stools. She states she has not tried anything otc . Her last bm was about 10 am today and it was normal. She states she usually has about 1 bm a day but she has been going about 3 times a day. That is really the only difference

## 2020-02-21 NOTE — Telephone Encounter (Signed)
Make sure she is taking Protonix twice a day, 30 minutes before breakfast and dinner. GES normal. Known sliding hiatal hernia. Any dysphagia? If show, we can pursue an UGI barium swallow.

## 2020-02-21 NOTE — Telephone Encounter (Signed)
Called pt and she stated she is taking protonix twice a day 30 mins before meals and she is not having any trouble swallowing things. No dysphagia. She didn't think she needed to do an ugi barium swallow test.

## 2020-02-22 DIAGNOSIS — E785 Hyperlipidemia, unspecified: Secondary | ICD-10-CM | POA: Diagnosis not present

## 2020-02-22 DIAGNOSIS — I10 Essential (primary) hypertension: Secondary | ICD-10-CM | POA: Diagnosis not present

## 2020-02-22 NOTE — Telephone Encounter (Signed)
Noted. GES was normal. Recommend small, frequent meals throughout day. Do not eat for at least 3 hours before laying down. Sit upright after meals. Call if persistent. Any nausea?

## 2020-02-22 NOTE — Telephone Encounter (Signed)
Called pt notified her of recommendations. Pt stated she is feeling like a million dollars today. She isnt having any nausea and she will call us if she needs [place]

## 2020-03-05 ENCOUNTER — Telehealth: Payer: Self-pay | Admitting: Internal Medicine

## 2020-03-05 DIAGNOSIS — Z7982 Long term (current) use of aspirin: Secondary | ICD-10-CM | POA: Diagnosis not present

## 2020-03-05 DIAGNOSIS — J8410 Pulmonary fibrosis, unspecified: Secondary | ICD-10-CM | POA: Diagnosis not present

## 2020-03-05 DIAGNOSIS — Z79899 Other long term (current) drug therapy: Secondary | ICD-10-CM | POA: Diagnosis not present

## 2020-03-05 DIAGNOSIS — G40909 Epilepsy, unspecified, not intractable, without status epilepticus: Secondary | ICD-10-CM | POA: Diagnosis not present

## 2020-03-05 DIAGNOSIS — Z87891 Personal history of nicotine dependence: Secondary | ICD-10-CM | POA: Diagnosis not present

## 2020-03-05 DIAGNOSIS — J441 Chronic obstructive pulmonary disease with (acute) exacerbation: Secondary | ICD-10-CM | POA: Diagnosis not present

## 2020-03-05 DIAGNOSIS — E7800 Pure hypercholesterolemia, unspecified: Secondary | ICD-10-CM | POA: Diagnosis not present

## 2020-03-05 DIAGNOSIS — R0602 Shortness of breath: Secondary | ICD-10-CM | POA: Diagnosis not present

## 2020-03-05 DIAGNOSIS — I10 Essential (primary) hypertension: Secondary | ICD-10-CM | POA: Diagnosis not present

## 2020-03-05 NOTE — Telephone Encounter (Signed)
Called and spoke with Patient. Patient stated she is having increased sob, some occasional cough.  Patient stated she is able to check her sats and she has noticed they are dropping in the 80's with little exertion.  Patient stated the lowest sat was 74% on 3 L [place]. Dr Chase Caller is not in office today, and next available NP appointment is 03/08/20.  Advised Patient to go to Urgent Care or ED to be evaluated and treated. Patient stated understanding.  Nothing further at this time.

## 2020-03-06 DIAGNOSIS — R0602 Shortness of breath: Secondary | ICD-10-CM | POA: Diagnosis not present

## 2020-03-06 DIAGNOSIS — J441 Chronic obstructive pulmonary disease with (acute) exacerbation: Secondary | ICD-10-CM | POA: Diagnosis not present

## 2020-03-06 DIAGNOSIS — Z96649 Presence of unspecified artificial hip joint: Secondary | ICD-10-CM | POA: Diagnosis not present

## 2020-03-06 DIAGNOSIS — J849 Interstitial pulmonary disease, unspecified: Secondary | ICD-10-CM | POA: Diagnosis not present

## 2020-03-06 DIAGNOSIS — R0902 Hypoxemia: Secondary | ICD-10-CM | POA: Diagnosis not present

## 2020-03-06 DIAGNOSIS — Z7982 Long term (current) use of aspirin: Secondary | ICD-10-CM | POA: Diagnosis not present

## 2020-03-06 DIAGNOSIS — I509 Heart failure, unspecified: Secondary | ICD-10-CM | POA: Diagnosis not present

## 2020-03-06 DIAGNOSIS — M1611 Unilateral primary osteoarthritis, right hip: Secondary | ICD-10-CM | POA: Diagnosis not present

## 2020-03-06 DIAGNOSIS — E7800 Pure hypercholesterolemia, unspecified: Secondary | ICD-10-CM | POA: Diagnosis not present

## 2020-03-06 DIAGNOSIS — Z87891 Personal history of nicotine dependence: Secondary | ICD-10-CM | POA: Diagnosis not present

## 2020-03-06 DIAGNOSIS — Z20822 Contact with and (suspected) exposure to covid-19: Secondary | ICD-10-CM | POA: Diagnosis not present

## 2020-03-06 DIAGNOSIS — I130 Hypertensive heart and chronic kidney disease with heart failure and stage 1 through stage 4 chronic kidney disease, or unspecified chronic kidney disease: Secondary | ICD-10-CM | POA: Diagnosis not present

## 2020-03-06 DIAGNOSIS — Z96641 Presence of right artificial hip joint: Secondary | ICD-10-CM | POA: Diagnosis not present

## 2020-03-06 DIAGNOSIS — J449 Chronic obstructive pulmonary disease, unspecified: Secondary | ICD-10-CM | POA: Diagnosis not present

## 2020-03-07 DIAGNOSIS — J849 Interstitial pulmonary disease, unspecified: Secondary | ICD-10-CM | POA: Diagnosis not present

## 2020-03-07 DIAGNOSIS — J449 Chronic obstructive pulmonary disease, unspecified: Secondary | ICD-10-CM | POA: Diagnosis not present

## 2020-03-08 ENCOUNTER — Other Ambulatory Visit: Payer: Self-pay

## 2020-03-08 DIAGNOSIS — F322 Major depressive disorder, single episode, severe without psychotic features: Secondary | ICD-10-CM | POA: Diagnosis not present

## 2020-03-08 DIAGNOSIS — N183 Chronic kidney disease, stage 3 unspecified: Secondary | ICD-10-CM | POA: Diagnosis not present

## 2020-03-08 DIAGNOSIS — I272 Pulmonary hypertension, unspecified: Secondary | ICD-10-CM | POA: Diagnosis not present

## 2020-03-08 DIAGNOSIS — I10 Essential (primary) hypertension: Secondary | ICD-10-CM | POA: Diagnosis not present

## 2020-03-08 DIAGNOSIS — Z09 Encounter for follow-up examination after completed treatment for conditions other than malignant neoplasm: Secondary | ICD-10-CM | POA: Diagnosis not present

## 2020-03-08 NOTE — Patient Outreach (Signed)
[facility]) Care Management  03/08/2020  Shikita Vaillancourt Busick Feb 02, 1931 [id number]     Transition of Care Referral  Referral Date: 03/08/2020 Referral Source: [facility] Discharge Report Date of Discharge: 03/05/2020 Facility:[facility] Insurance: [facility] Medicare    Upon chart review, noted that patient was npt admitted to hospital due to no beds available and just had overnight stay in ED.     Plan: RN CM will close case at this time.   Enzo Montgomery, RN,BSN,CCM [place] Management Telephonic Care Management Coordinator Direct Phone: [phone] Toll Free: [phone] Fax: [phone]

## 2020-03-09 ENCOUNTER — Other Ambulatory Visit (HOSPITAL_COMMUNITY)
Admission: RE | Admit: 2020-03-09 | Discharge: 2020-03-09 | Disposition: A | Discharge status: Home / Self Care | Payer: Medicare HMO | Source: Ambulatory Visit | Attending: Primary Care | Admitting: Primary Care

## 2020-03-09 NOTE — Progress Notes (Signed)
Patient called and spoke with Pam up front. She told her she wouldn't be able to come today. I called patient and lmom  Asking her to call her Dr.'s office and see if is ok for her to get her test Mon. Instead. I called [facility]) who made the appointment and ask her if we could do the test Mon. She said "sure, as long as you can get the test back in time. "Yes, ma'am. Called patient back and told her to disregard the 1st message and give me a call and I will reschedule your appointment. Waiting for patient to call. I just looked at [facility] schedule, per Geraldo Pitter patient is having Covid screening done Monday morning. Called patient and left her a message, to make sure she is aware of her appt. Monday 03/12/2020. I didn't see a note that Mrs. Krasowski had been notified. Nothing further needed.

## 2020-03-12 ENCOUNTER — Other Ambulatory Visit: Payer: Self-pay

## 2020-03-12 ENCOUNTER — Other Ambulatory Visit (HOSPITAL_COMMUNITY)
Admission: RE | Admit: 2020-03-12 | Discharge: 2020-03-12 | Disposition: A | Discharge status: Home / Self Care | Payer: Medicare HMO | Source: Ambulatory Visit | Attending: Primary Care | Admitting: Primary Care

## 2020-03-12 DIAGNOSIS — Z20822 Contact with and (suspected) exposure to covid-19: Secondary | ICD-10-CM | POA: Insufficient documentation

## 2020-03-12 DIAGNOSIS — Z01812 Encounter for preprocedural laboratory examination: Secondary | ICD-10-CM | POA: Insufficient documentation

## 2020-03-12 LAB — SARS CORONAVIRUS 2 (TAT 6-24 HRS): SARS Coronavirus 2: NEGATIVE

## 2020-03-13 ENCOUNTER — Ambulatory Visit (HOSPITAL_COMMUNITY)
Admission: RE | Admit: 2020-03-13 | Discharge: 2020-03-13 | Disposition: A | Discharge status: Home / Self Care | Payer: Medicare HMO | Source: Ambulatory Visit | Attending: Primary Care | Admitting: Primary Care

## 2020-03-13 DIAGNOSIS — J9611 Chronic respiratory failure with hypoxia: Secondary | ICD-10-CM | POA: Insufficient documentation

## 2020-03-13 LAB — PULMONARY FUNCTION TEST
DL/VA % pred: 42 %
DL/VA: 1.69 ml/min/mmHg/L
DLCO unc % pred: 23 %
DLCO unc: 4.52 ml/min/mmHg
FEF 25-75 Post: 1.5 L/sec
FEF 25-75 Pre: 1.41 L/sec
FEF2575-%Change-Post: 6 %
FEF2575-%Pred-Post: 130 %
FEF2575-%Pred-Pre: 122 %
FEV1-%Change-Post: 1 %
FEV1-%Pred-Post: 72 %
FEV1-%Pred-Pre: 71 %
FEV1-Post: 1.32 L
FEV1-Pre: 1.3 L
FEV1FVC-%Change-Post: 0 %
FEV1FVC-%Pred-Pre: 114 %
FEV6-%Change-Post: 1 %
FEV6-%Pred-Post: 68 %
FEV6-%Pred-Pre: 67 %
FEV6-Post: 1.59 L
FEV6-Pre: 1.57 L
FEV6FVC-%Change-Post: 0 %
FEV6FVC-%Pred-Post: 106 %
FEV6FVC-%Pred-Pre: 106 %
FVC-%Change-Post: 1 %
FVC-%Pred-Post: 64 %
FVC-%Pred-Pre: 63 %
FVC-Post: 1.59 L
FVC-Pre: 1.57 L
Post FEV1/FVC ratio: 83 %
Post FEV6/FVC ratio: 100 %
Pre FEV1/FVC ratio: 83 %
Pre FEV6/FVC Ratio: 100 %
RV % pred: 75 %
RV: 1.92 L
TLC % pred: 70 %
TLC: 3.69 L

## 2020-03-13 MED ORDER — ALBUTEROL SULFATE (2.5 MG/3ML) 0.083% IN NEBU
2.5000 mg | INHALATION_SOLUTION | Freq: Once | RESPIRATORY_TRACT | Status: AC
  Administered 2020-03-13: 2.5 mg via RESPIRATORY_TRACT

## 2020-03-13 NOTE — Progress Notes (Signed)
Please let patient know PFTs showed moderate restriction in lungs with decreased diffusion capacity. Similar to previous. Follow up as previously recommended

## 2020-03-14 NOTE — Progress Notes (Signed)
Tried calling the pt. Someone answered but did not speak. WCB later.

## 2020-03-19 ENCOUNTER — Telehealth: Payer: Self-pay | Admitting: Internal Medicine

## 2020-03-19 DIAGNOSIS — J8410 Pulmonary fibrosis, unspecified: Secondary | ICD-10-CM | POA: Diagnosis not present

## 2020-03-19 DIAGNOSIS — Z299 Encounter for prophylactic measures, unspecified: Secondary | ICD-10-CM | POA: Diagnosis not present

## 2020-03-19 DIAGNOSIS — J40 Bronchitis, not specified as acute or chronic: Secondary | ICD-10-CM | POA: Diagnosis not present

## 2020-03-19 DIAGNOSIS — I10 Essential (primary) hypertension: Secondary | ICD-10-CM | POA: Diagnosis not present

## 2020-03-19 NOTE — Telephone Encounter (Signed)
Called and spoke with patient about recs from Judson Roch. Let her know that she needs to be using her Hypertonic nebs. Informed patient that we had some openings for tomorrow and patient stated that she has an appointment tomorrow at 8:30am with another doctor and that she would not have a ride to [place]. Then informed patient that Judson Roch thinks she needs a chest xray and if we were unable to get her here tomorrow she would need to go to ED or urgent care. Patient expressed understanding and stated she would call back if she could get in tomorrow. Nothing further needed at this time

## 2020-03-19 NOTE — Telephone Encounter (Signed)
Called and spoke with patient and she states that she was seen at [facility] on 7/13 and was given albuterol inhaler and Prednisone. She finished Prednisone on 7/21 and has not been feeling good since. She is also prescribed Hypertonic nebs but has not used them recently. She does have a cough but states she is getting up very little.   Ashlee Martin please advise

## 2020-03-19 NOTE — Telephone Encounter (Signed)
She needs to use her hypertonic nebs.  She needs a CXR. She can be seen or she can go somewhere near home and have a CXR at an urgent care.  If her sats are dropping below 88%, she needs to go to the ED, or seek emergency care.

## 2020-03-20 DIAGNOSIS — R0602 Shortness of breath: Secondary | ICD-10-CM | POA: Diagnosis not present

## 2020-03-20 DIAGNOSIS — R0600 Dyspnea, unspecified: Secondary | ICD-10-CM | POA: Diagnosis not present

## 2020-03-20 DIAGNOSIS — J8410 Pulmonary fibrosis, unspecified: Secondary | ICD-10-CM | POA: Diagnosis not present

## 2020-03-20 IMAGING — US US ABDOMEN COMPLETE
1 series · 13 of 25 positions shown · non-contrast
Comparison: CT scan of January 12, 2017. Ultrasound April 18, 2016.

CLINICAL DATA: Hepatic steatosis.

EXAM:
ABDOMEN ULTRASOUND COMPLETE

[Series 1: us abdomen complete · 13 of 103 slices shown]
[im 1/103]
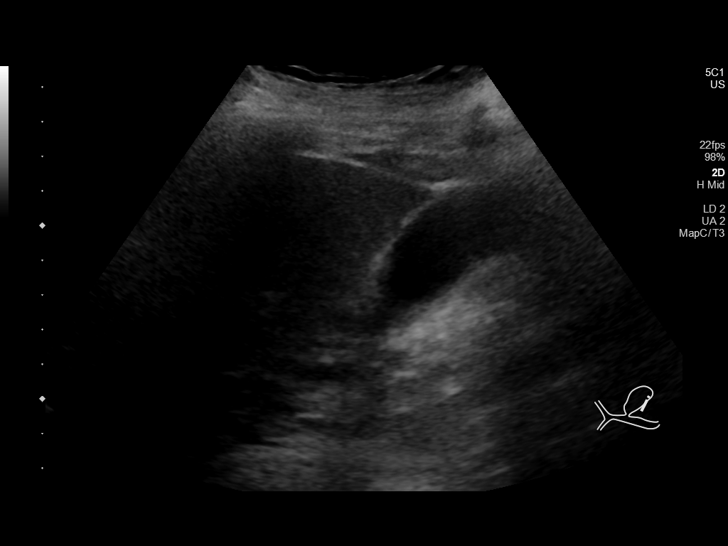
[im 9/103]
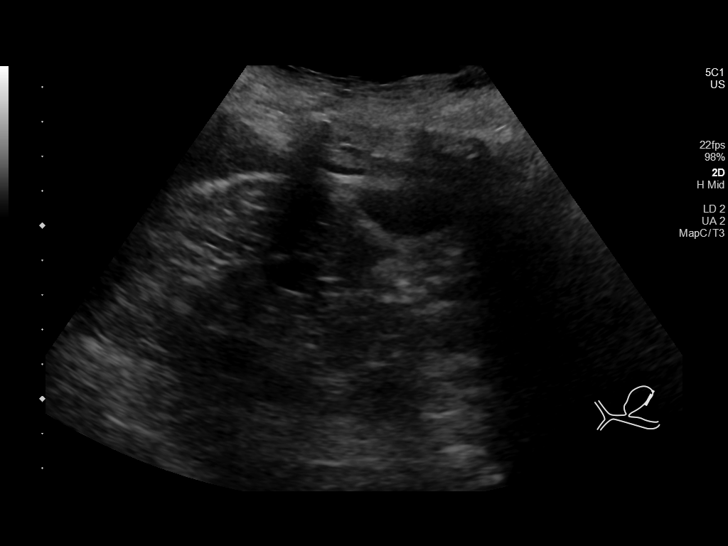
[im 18/103]
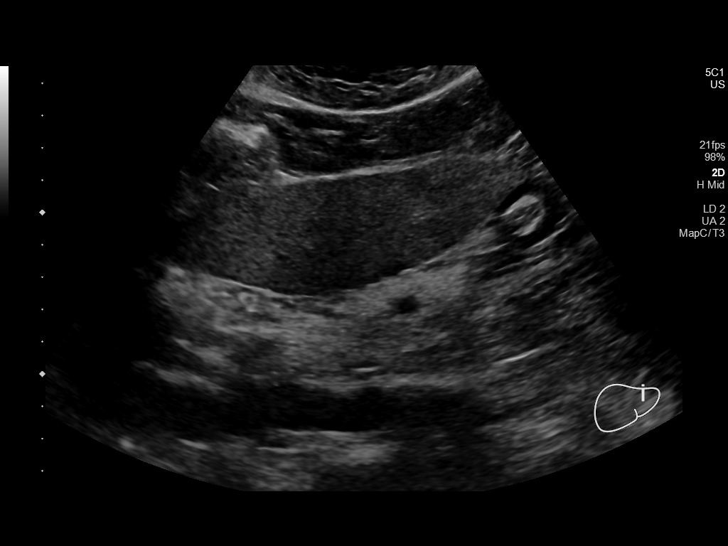
[im 26/103]
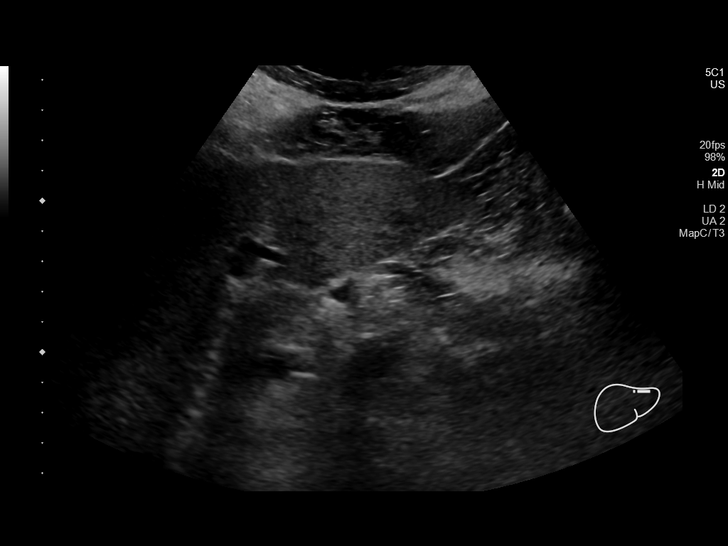
[im 35/103]
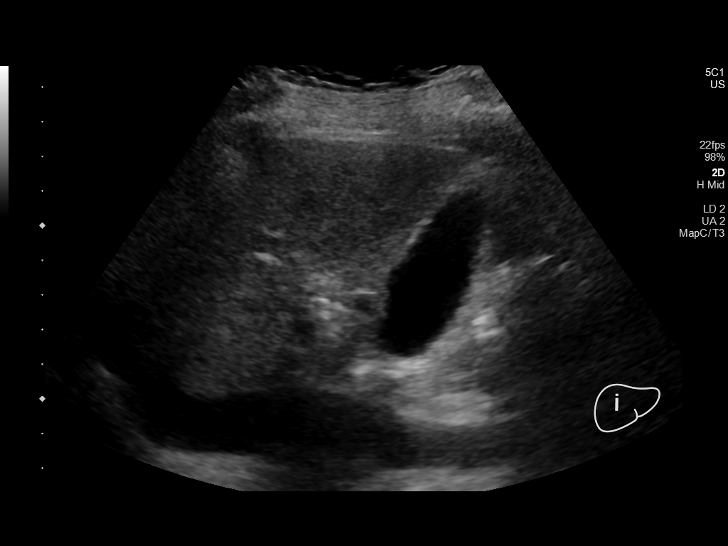
[im 43/103]
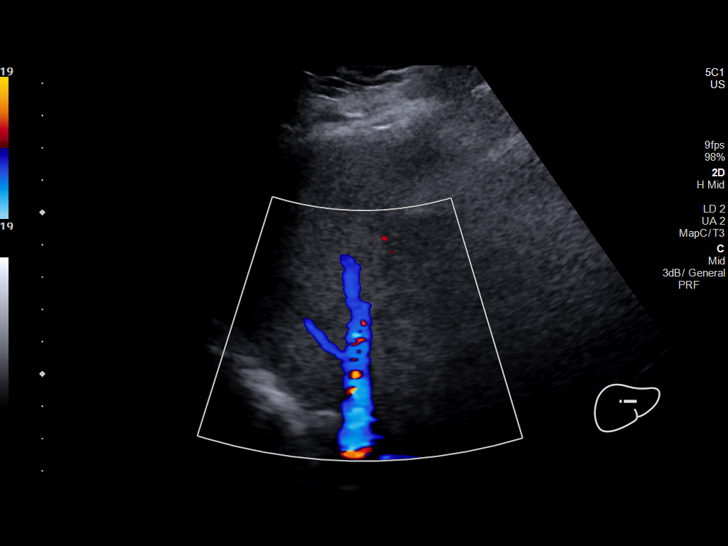
[im 52/103]
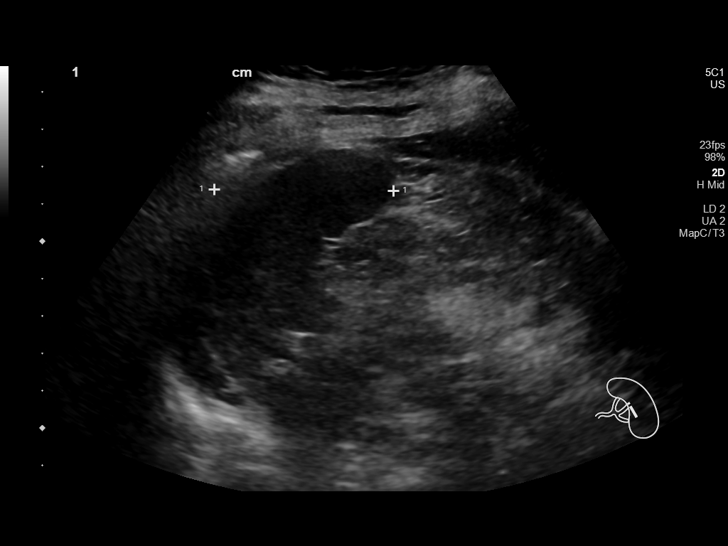
[im 60/103]
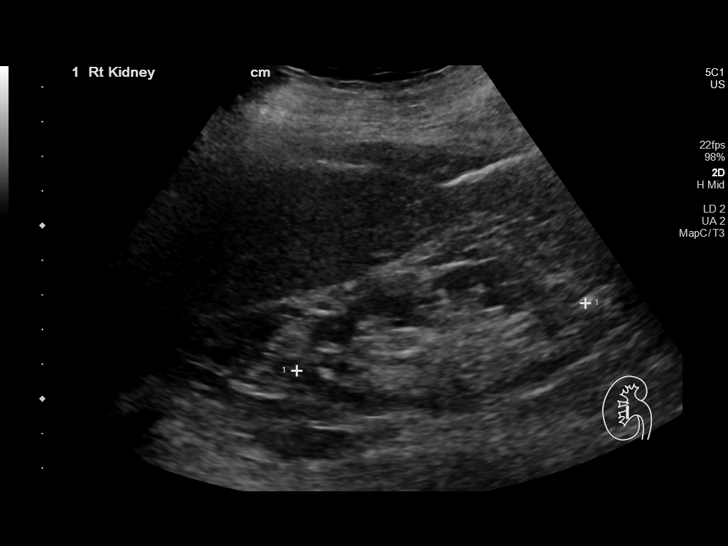
[im 69/103]
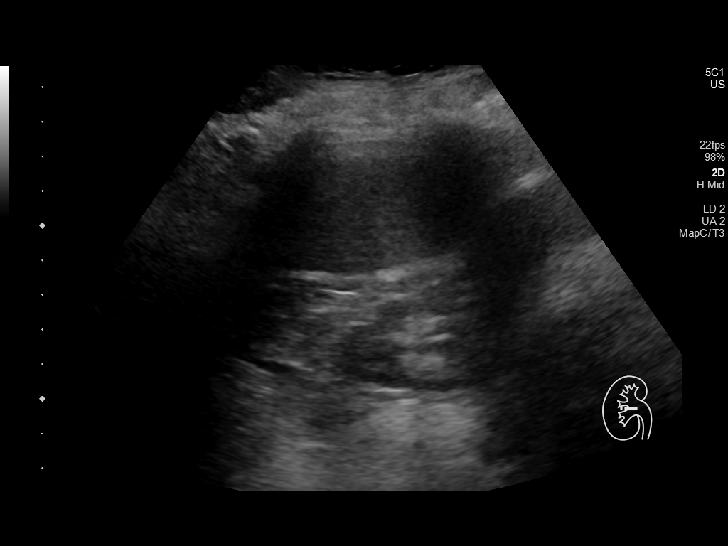
[im 77/103]
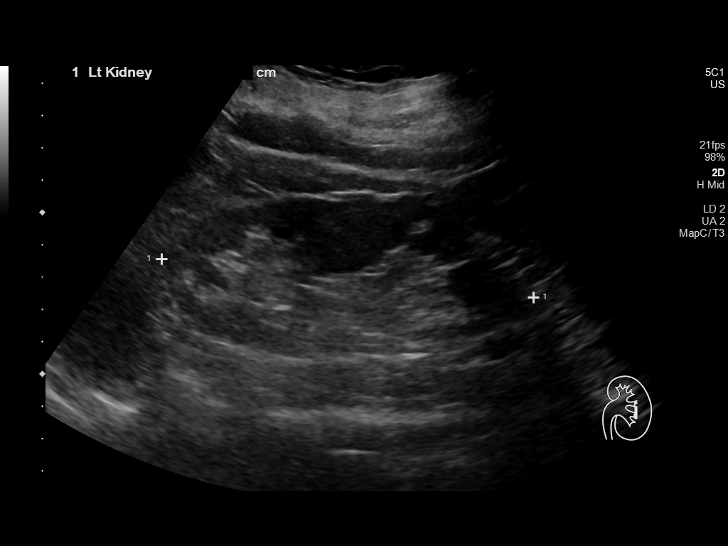
[im 86/103]
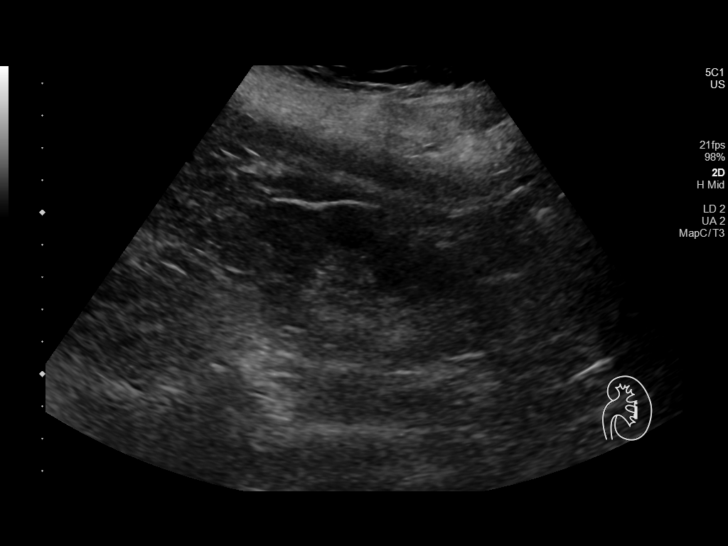
[im 94/103]
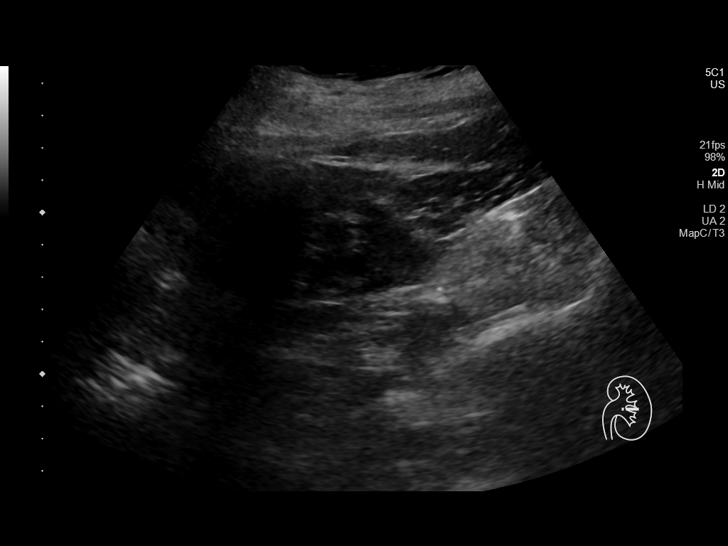
[im 103/103]
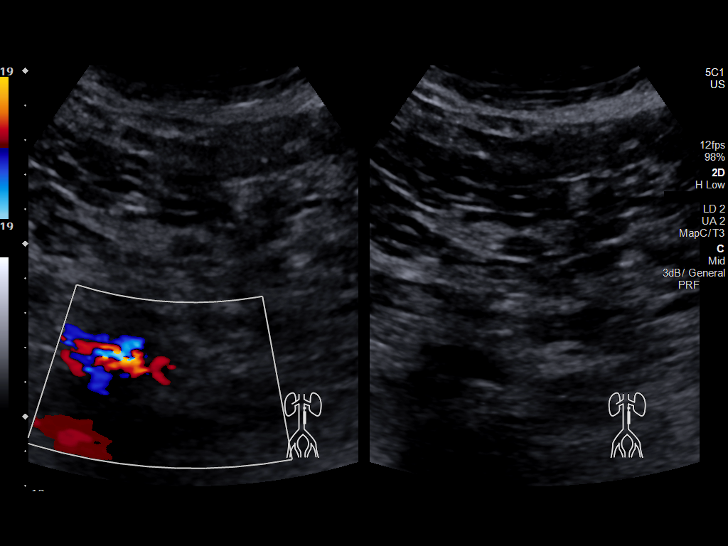

[13 of 25 positions shown; findings below may reference images not displayed]

FINDINGS: Gallbladder: No gallstones or wall thickening visualized. No
sonographic Murphy sign noted by sonographer.

Common bile duct: Diameter: 5 mm which is within normal limits.

Liver: Increased echogenicity of hepatic parenchyma is noted
suggesting fatty infiltration. 1.2 cm hypoechoic focus is seen in
the left hepatic lobe which most likely represents fatty sparing,
but other pathology cannot be excluded.. Portal vein is patent on
color Doppler imaging with normal direction of blood flow towards
the liver.

IVC: No abnormality visualized.

Pancreas: Visualized portion unremarkable.

Spleen: Size and appearance within normal limits.

Right Kidney: Length: 8.6 cm with lobulated cortex. Echogenicity
within normal limits. No mass or hydronephrosis visualized.

Left Kidney: Length: 10.7 cm. Echogenicity within normal limits. No
mass or hydronephrosis visualized.

Abdominal aorta: No aneurysm visualized.

Other findings: None.
IMPRESSION: Fatty infiltration of the liver, with focal hypoechoic area seen in
left hepatic lobe most consistent with fatty sparing. Other
pathology cannot be excluded and follow-up ultrasound in 3-6 months
is recommended to ensure stability or resolution.

Right renal atrophy is noted with lobulated cortex most consistent
with scarring secondary to infection. No other renal abnormality is
noted.

## 2020-03-23 ENCOUNTER — Telehealth: Payer: Self-pay | Admitting: Internal Medicine

## 2020-03-23 DIAGNOSIS — E785 Hyperlipidemia, unspecified: Secondary | ICD-10-CM | POA: Diagnosis not present

## 2020-03-23 DIAGNOSIS — I10 Essential (primary) hypertension: Secondary | ICD-10-CM | POA: Diagnosis not present

## 2020-03-23 MED ORDER — PREDNISONE 10 MG PO TABS
ORAL_TABLET | ORAL | 0 refills | Status: DC
Start: 2020-03-23 — End: 2020-04-09

## 2020-03-23 NOTE — Telephone Encounter (Signed)
Attempted to call patient, line rang, no option to leave a message.  3rd attempt to reach patient.  Closing out encounter.poer triage policy.

## 2020-03-23 NOTE — Telephone Encounter (Signed)
Breathing downhill since says d/c from hospital and last dose of prednisone 8 d ago. No cp or cough   Ok at rest on 3lpm / not using neb   rec Prednisone 10 mg take  4 each am x 2 days,   2 each am x 2 days,  1 each am x 2 days and stop   To er if can't get comfortable at rest and keep sats > 90% on her supplemental 02  Needs f/u week of 03/26/20

## 2020-03-23 NOTE — Telephone Encounter (Signed)
Attempted to call pt but received a busy signal. Tried to call back but again received a busy signal. Will try to call back later.

## 2020-03-26 DIAGNOSIS — N183 Chronic kidney disease, stage 3 unspecified: Secondary | ICD-10-CM | POA: Diagnosis not present

## 2020-03-26 DIAGNOSIS — F322 Major depressive disorder, single episode, severe without psychotic features: Secondary | ICD-10-CM | POA: Diagnosis not present

## 2020-03-26 DIAGNOSIS — Z0000 Encounter for general adult medical examination without abnormal findings: Secondary | ICD-10-CM | POA: Diagnosis not present

## 2020-03-26 DIAGNOSIS — E785 Hyperlipidemia, unspecified: Secondary | ICD-10-CM | POA: Diagnosis not present

## 2020-03-26 DIAGNOSIS — R5383 Other fatigue: Secondary | ICD-10-CM | POA: Diagnosis not present

## 2020-03-26 DIAGNOSIS — I10 Essential (primary) hypertension: Secondary | ICD-10-CM | POA: Diagnosis not present

## 2020-03-26 DIAGNOSIS — Z299 Encounter for prophylactic measures, unspecified: Secondary | ICD-10-CM | POA: Diagnosis not present

## 2020-03-26 DIAGNOSIS — Z1331 Encounter for screening for depression: Secondary | ICD-10-CM | POA: Diagnosis not present

## 2020-03-26 DIAGNOSIS — Z7189 Other specified counseling: Secondary | ICD-10-CM | POA: Diagnosis not present

## 2020-03-26 DIAGNOSIS — Z79899 Other long term (current) drug therapy: Secondary | ICD-10-CM | POA: Diagnosis not present

## 2020-03-26 DIAGNOSIS — Z1339 Encounter for screening examination for other mental health and behavioral disorders: Secondary | ICD-10-CM | POA: Diagnosis not present

## 2020-03-26 NOTE — Telephone Encounter (Signed)
Patient contacted, she is doing much better, sats WNL, follow up appointment scheduled.

## 2020-03-26 NOTE — Progress Notes (Signed)
Left voicemail for patient to call office back to get results.

## 2020-03-29 DIAGNOSIS — G2581 Restless legs syndrome: Secondary | ICD-10-CM | POA: Diagnosis not present

## 2020-03-29 DIAGNOSIS — E785 Hyperlipidemia, unspecified: Secondary | ICD-10-CM | POA: Diagnosis not present

## 2020-03-29 DIAGNOSIS — G8929 Other chronic pain: Secondary | ICD-10-CM | POA: Diagnosis not present

## 2020-03-29 DIAGNOSIS — M546 Pain in thoracic spine: Secondary | ICD-10-CM | POA: Diagnosis not present

## 2020-03-29 DIAGNOSIS — K219 Gastro-esophageal reflux disease without esophagitis: Secondary | ICD-10-CM | POA: Diagnosis not present

## 2020-03-29 DIAGNOSIS — N183 Chronic kidney disease, stage 3 unspecified: Secondary | ICD-10-CM | POA: Diagnosis not present

## 2020-03-29 DIAGNOSIS — J449 Chronic obstructive pulmonary disease, unspecified: Secondary | ICD-10-CM | POA: Diagnosis not present

## 2020-03-29 DIAGNOSIS — I129 Hypertensive chronic kidney disease with stage 1 through stage 4 chronic kidney disease, or unspecified chronic kidney disease: Secondary | ICD-10-CM | POA: Diagnosis not present

## 2020-03-29 DIAGNOSIS — J8410 Pulmonary fibrosis, unspecified: Secondary | ICD-10-CM | POA: Diagnosis not present

## 2020-04-03 ENCOUNTER — Ambulatory Visit: Payer: Medicare HMO | Admitting: Adult Health

## 2020-04-03 ENCOUNTER — Ambulatory Visit (INDEPENDENT_AMBULATORY_CARE_PROVIDER_SITE_OTHER): Payer: Medicare HMO | Admitting: Adult Health

## 2020-04-03 ENCOUNTER — Other Ambulatory Visit: Payer: Self-pay

## 2020-04-03 DIAGNOSIS — J84112 Idiopathic pulmonary fibrosis: Secondary | ICD-10-CM

## 2020-04-03 DIAGNOSIS — J9601 Acute respiratory failure with hypoxia: Secondary | ICD-10-CM | POA: Diagnosis not present

## 2020-04-03 NOTE — Progress Notes (Signed)
Virtual Visit via Telephone Note  I connected with Ashlee Martin on 04/03/20 at  3:30 PM EDT by telephone and verified that I am speaking with the correct person using two identifiers.  Location: Patient: Home  Provider: Office    I discussed the limitations, risks, security and privacy concerns of performing an evaluation and management service by telephone and the availability of in person appointments. I also discussed with the patient that there may be a patient responsible charge related to this service. The patient expressed understanding and agreed to proceed.   History of Present Illness: 86 year old female former smoker (60-pack-year history) followed for IPF, chronic respiratory failure on oxygen, allergic rhinitis Medical history significant for psoriasis and GERD  Today's telemedicine visit is a follow-up from recent hospitalization.  Patient was recently hospitalized for acute on chronic respiratory failure of IPF. She was treated for acute respiratory symptoms with cough and shortness of breath.  Treated with nebulized bronchodilators and steroids.  Patient says since recent hospitalization she is feeling better.  She is frustrated that she keeps getting treated for COPD exacerbations however says she has underlying pulmonary fibrosis and not COPD and due to this diagnosis she is getting a denial of coverage from her insurance company.  She does have albuterol nebulizer.  Was previously on Ofev and Esbriet but is unable to tolerate due to GI side effects.  She is on 3 L of oxygen.  Since discharge she says she is feeling better with decreased shortness of breath.  Gets winded with minimal activity.  She is unable to drive and has limited transportation.   Observations/Objective: 06/15/18 -CT chest high-res-previously noted left Kaminski lobe nodules completely resolved, chronic changes of interstitial lung disease again considered probable UIP  09/07/2019 - CT Chest High Res -  pulmonary parenchymal pattern of fibrosis is unchanged from 11/13/2017 is likely due to UIP, categorized as probable UIP marginal irregularity of the liver is indicative of cirrhosis, aortic arthrosclerosis, enlarged pulmonary trunk indicative of PAH  Pulmonary toxicity history: Other thanexposure to Humira and short-term prednisone no other pulmonary toxicity history including nitrofurantoin or BCG or amiodarone or chemotherapy   Assessment and Plan: IPF with increased symptom burden.  Frequent hospitalizations. We discuss palliative care.  She is open to this.  Would continue on current regimen keep oxygen levels greater than 88 to 90%.  Chronic respiratory failure.  Continue oxygen to keep O2 saturation greater than 88 to 9%  Plan  Patient Instructions  Referral for Palliative care in [facility].  Continue on Oxygen 3l/m .  Activity as tolerated.  Albuterol As needed   Follow up with Dr. Chase Caller in 4 weeks in person visit and As needed       Follow Up Instructions: Follow-up in 4 weeks and as needed   I discussed the assessment and treatment plan with the patient. The patient was provided an opportunity to ask questions and all were answered. The patient agreed with the plan and demonstrated an understanding of the instructions.   The patient was advised to call back or seek an in-person evaluation if the symptoms worsen or if the condition fails to improve as anticipated.  I provided 24  minutes of non-face-to-face time during this encounter.   Rexene Edison, NP

## 2020-04-03 NOTE — Patient Instructions (Signed)
Referral for Palliative care in [facility].  Continue on Oxygen 3l/m .  Activity as tolerated.  Albuterol As needed   Follow up with Dr. Chase Caller in 4 weeks in person visit and As needed

## 2020-04-06 DIAGNOSIS — R0902 Hypoxemia: Secondary | ICD-10-CM | POA: Diagnosis not present

## 2020-04-06 DIAGNOSIS — J849 Interstitial pulmonary disease, unspecified: Secondary | ICD-10-CM | POA: Diagnosis not present

## 2020-04-06 DIAGNOSIS — M1611 Unilateral primary osteoarthritis, right hip: Secondary | ICD-10-CM | POA: Diagnosis not present

## 2020-04-06 DIAGNOSIS — Z96649 Presence of unspecified artificial hip joint: Secondary | ICD-10-CM | POA: Diagnosis not present

## 2020-04-09 ENCOUNTER — Telehealth: Payer: Self-pay | Admitting: Adult Health

## 2020-04-09 MED ORDER — CEPHALEXIN 500 MG PO CAPS: 500.0000 mg | ORAL_CAPSULE | Freq: Three times a day (TID) | ORAL | 0 refills | Status: DC

## 2020-04-09 MED ORDER — PREDNISONE 10 MG PO TABS: ORAL_TABLET | ORAL | 0 refills | Status: DC

## 2020-04-09 NOTE — Telephone Encounter (Signed)
Spoke with pt . She c/o increased SOB for past 3-4 days. PT on 3 L of oxygen cont. PT states sats are in high 80's but not sure if her oximeter works correctly. Increased cough last night. Diff sleeping. Denies FCS. Using Neb with Duoneb 2-3 times daily. Pt is not on Prednisone at this time. Pt denies recent Covid exposure. Pt has been vaccinated. Please advise.

## 2020-04-09 NOTE — Telephone Encounter (Signed)
She has ILD   Plan  - Take prednisone 40 mg daily x 2 days, then 84m daily x 2 days, then 121mdaily x 2 days, then 59m53maily x 2 days and stop   - cephalexin 500m62mree times daily x  5 days  - if getting worse and pulse ox goes to mid and low 80s despite o2 and she feels bad - go to hospital/ER     Allergies  Allergen Reactions  . Humira [Adalimumab] Other (See Comments)    Worsened your psoriasis.  . Pirfenidone Other (See Comments)    Fatigue and weight loss

## 2020-04-09 NOTE — Telephone Encounter (Signed)
Spoke with pt and advised of Dr Golden Pop recommendations. PT verbalized understanding. Rx sent to pharmacy. Nothing further needed.

## 2020-04-19 DIAGNOSIS — Z515 Encounter for palliative care: Secondary | ICD-10-CM | POA: Diagnosis not present

## 2020-04-19 DIAGNOSIS — J84112 Idiopathic pulmonary fibrosis: Secondary | ICD-10-CM | POA: Diagnosis not present

## 2020-04-19 DIAGNOSIS — J9611 Chronic respiratory failure with hypoxia: Secondary | ICD-10-CM | POA: Diagnosis not present

## 2020-04-19 DIAGNOSIS — Z7189 Other specified counseling: Secondary | ICD-10-CM | POA: Diagnosis not present

## 2020-04-23 DIAGNOSIS — Z299 Encounter for prophylactic measures, unspecified: Secondary | ICD-10-CM | POA: Diagnosis not present

## 2020-04-23 DIAGNOSIS — I700 Atherosclerosis of aorta: Secondary | ICD-10-CM | POA: Diagnosis not present

## 2020-04-23 DIAGNOSIS — J849 Interstitial pulmonary disease, unspecified: Secondary | ICD-10-CM | POA: Diagnosis not present

## 2020-04-23 DIAGNOSIS — J9611 Chronic respiratory failure with hypoxia: Secondary | ICD-10-CM | POA: Diagnosis not present

## 2020-04-23 DIAGNOSIS — I10 Essential (primary) hypertension: Secondary | ICD-10-CM | POA: Diagnosis not present

## 2020-04-23 DIAGNOSIS — Z87891 Personal history of nicotine dependence: Secondary | ICD-10-CM | POA: Diagnosis not present

## 2020-04-23 DIAGNOSIS — J40 Bronchitis, not specified as acute or chronic: Secondary | ICD-10-CM | POA: Diagnosis not present

## 2020-05-07 DIAGNOSIS — J849 Interstitial pulmonary disease, unspecified: Secondary | ICD-10-CM | POA: Diagnosis not present

## 2020-05-07 DIAGNOSIS — M1611 Unilateral primary osteoarthritis, right hip: Secondary | ICD-10-CM | POA: Diagnosis not present

## 2020-05-07 DIAGNOSIS — Z96649 Presence of unspecified artificial hip joint: Secondary | ICD-10-CM | POA: Diagnosis not present

## 2020-05-07 DIAGNOSIS — R0902 Hypoxemia: Secondary | ICD-10-CM | POA: Diagnosis not present

## 2020-05-10 ENCOUNTER — Ambulatory Visit (INDEPENDENT_AMBULATORY_CARE_PROVIDER_SITE_OTHER): Payer: Medicare HMO | Admitting: Internal Medicine

## 2020-05-10 ENCOUNTER — Telehealth: Payer: Self-pay | Admitting: Internal Medicine

## 2020-05-10 ENCOUNTER — Other Ambulatory Visit: Payer: Self-pay

## 2020-05-10 ENCOUNTER — Encounter: Payer: Self-pay | Admitting: Internal Medicine

## 2020-05-10 VITALS — BP 110/60 | HR 64 | Temp 97.8°F | Ht 65.0 in | Wt 146.2 lb

## 2020-05-10 DIAGNOSIS — I288 Other diseases of pulmonary vessels: Secondary | ICD-10-CM | POA: Diagnosis not present

## 2020-05-10 DIAGNOSIS — J84112 Idiopathic pulmonary fibrosis: Secondary | ICD-10-CM

## 2020-05-10 DIAGNOSIS — J9611 Chronic respiratory failure with hypoxia: Secondary | ICD-10-CM

## 2020-05-10 DIAGNOSIS — R5381 Other malaise: Secondary | ICD-10-CM

## 2020-05-10 DIAGNOSIS — Z23 Encounter for immunization: Secondary | ICD-10-CM

## 2020-05-10 DIAGNOSIS — I272 Pulmonary hypertension, unspecified: Secondary | ICD-10-CM

## 2020-05-10 NOTE — Progress Notes (Signed)
If please do not begin to fast.  We are not I think that the other 2 prescriptions I like nobody is given for me to start on now you just do she needs so normally for [place] and what the pharmacy did with for like myself and Dr. Vaughan Browner we supposed to sign the actual prescriptions because we do a lot of this is so bad I guess you were not told about it so you just did the generic like this 1 this then goes to pharmacy  Pharmacy is going to send [place] an actual prescription to sign or not     PCP Jerene Bears B, MD  HPI  IOV 11/03/2017  Chief Complaint  Patient presents with  . Consult    Self referral. Pt states she was diagnosed with pulmonary fibrosis x4 weeks ago. States she has c/o cough with green mucus, mild SOB. Denies any CP.    86 year old female self-referred for evaluation of pulmonary fibrosis.  She is fairly good historian.  She tells me that she has diagnosis of psoriasis of the skin in 2016 and was under the care of Dr. Channing Mutters in [place].  She was on Humira for 11 months taking monthly injections but her rash got bad in November 2017 and then she was treated with prednisone.  She went on vacation to [place] and then returned and then had a diagnosis of pneumonia.  She was hospitalized for this at [facility] in January 2018.  She was on oxygen and hospitalized for 3 or 4 days and then discharged.  After that she is always had some amount of residual cough and mild shortness of breath but starting approximately 6 months ago the cough and shortness of breath was more perceptible.  However in the last 2 months a significant acceleration in the severity of cough and shortness of breath.  The cough is described as waking up at night and associated with phlegm.  She also gets short of breath walking or hurrying on level ground or walking up a slight hill.  Her primary care physician did a CT chest with contrast in February 2019.  I do not have the image with me but I reviewed the report and he  reports pulmonary fibrosis not otherwise specified.  She did end up in the emergency room on October 31, 2017 and is being discharged with [facility] and redness on taper which is helping.   American College of chest physicians interstitial lung disease questionnaire  Past medical history: Positive for seizures, history of pneumonia in January 2018, acid reflux disease, dry eyes, dry mouth, photophobia and arthralgia.  She has diagnosis of skin psoriasis.  This is not much of an issue at this point in time  Personal exposure history: She started smoking cigarettes at 86 years of age and smoked 3-4 packs a day and quit in the mid 1980s.  She reports having had use street drugs.  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  She only occasionally visits the coop.  She opens the door and looks that she can out.  Otherwise no exposure to water damage or mold a humidifier [place] or hot tub or Jacuzzi or birds or feathered pillows.  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  Then she retired and moved to [place] in [place] where she lived in normal homes for 5  years up until 8 years ago and then she and her husband moved to [place], [place] where they live in a farm.  Pulmonary toxicity history: Other than exposure to Humira and short-term prednisone no other pulmonary toxicity history including nitrofurantoin or BCG or amiodarone or chemotherapy    Walking desaturation test on 11/03/2017 185 feet x 3 laps on ROOM AIR:  did walk at slow pace and got mildly dyspneic. Did desaturate to 89%. Rest pulse ox was 98%, final pulse ox was 89%. HR response 67/min at rest to 91/min at peak exertion. [name]  Did not Desaturate < 88% . Ashlee Martin did yes  Desaturated </= 3% points. Ashlee Martin yes did get tachyardic    12/08/2017 Acute OV :Sinus congestion .  Patient presents  for an acute office visit.  Patient complains of sinus congestion , ear fullness and popping , sinus pressure , drippy nose, watery eyes, very little cough .  Has had this for last few months , Worse for the last 2 weeks .  Seen by PCP last week started on allergy pill and nasal spray , she does not know what they were.  Says she has chronic allergy problems , worse in Spring and Fall.  She denies any discolored mucus, sinus pain, teeth pain, fever, nausea vomiting diarrhea, or chest pain.    Patient was seen for pulmonary consult March 2019 to establish for pulmonary fibrosis.  She has underlying psoriasis.  Patient was set up for a CT chest that showed fibrotic interstitial lung disease with mild honeycombing.  Autoimmune panel was essentially negative except for SS B IgG antibody.  And sed rate was mildly elevated at 34. Patient has an upcoming PFT and follow-up visit with ILD clinic in 2 weeks. She was having a severe cough last visit.  She was given a prednisone taper.  Patient says she did require one other prednisone taper but her cough is almost totally resolved.  She has a little mild cough in the morning but very mild.   OV 12/29/2017  Chief Complaint  Patient presents with  . Follow-up    Pt states she has had both good days and bad days. States she has had some problems with allergies.    Ashlee Martin presns for followup for ILD workup.it is documented below.  Pulmonary function test in terms of severity shows moderate restriction with significant reduction in diffusion capacity.  Her high-resolution CT scan of the chest is read as probable UIP but I wonder if that is mixed emphysema although she quit smoking many decades ago and indeterminate pattern for UIP.  There might be air trapping but this is not reported.  I again went over her exposures and she confirms and her husband who is here with her today that there is significant chicken coop exposure ongoing currently.  There are no  new interim issues other than the fact she had a respiratory exacerbation which required prednisone which seemed to help her symptoms considerably.  Currently she is off prednisone.  Her autoimmune vasculitis and hypersensitivity pneumonitis panel was essentially negative except for trace autoimmune positivity.     Results for Ashlee Martin, Ashlee Martin (MRN [id number]) as of 12/29/2017 13:37  Ref. Range 12/29/2017 12:43  FVC-Pre Latest Units: L 1.49  FVC-%Pred-Pre Latest Units: % 58  FEV1-Pre Latest Units: L 1.18  FEV1-%Pred-Pre Latest Units: % 62  Pre FEV1/FVC ratio Latest Units: % 79  FEV1FVC-%Pred-Pre Latest Units: % 108  Results for Novella, Ashlee Martin (  MRN [id number]) as of 12/29/2017 13:37  Ref. Range 12/29/2017 12:43  DLCO unc Latest Units: ml/min/mmHg 7.82  DLCO unc % pred Latest Units: % 30   Results for Ashlee Martin, Ashlee Martin (MRN [id number]) as of 12/29/2017 13:37  Ref. Range 11/03/2017 10:08  ASPERGILLUS FUMIGATUS Latest Ref Range: NEGATIVE  NEGATIVE  Pigeon Serum Latest Ref Range: NEGATIVE  NEGATIVE  Anit Nuclear Antibody(ANA) Latest Ref Range: NEGATIVE  NEGATIVE  Angiotensin-Converting Enzyme Latest Ref Range: 9 - 67 U/L 28  Cyclic Citrullin Peptide Ab Latest Units: UNITS <16  ds DNA Ab Latest Units: IU/mL <1  ENA RNP Ab Latest Ref Range: 0.0 - 0.9 AI <0.2  Myeloperoxidase Abs Latest Units: AI <1.0  Serine Protease 3 Latest Units: AI <1.0  RA Latex Turbid. Latest Ref Range: <14 IU/mL <14  SSA (Ro) (ENA) Antibody, IgG Latest Ref Range: <1.0 NEG AI <1.0 NEG  SSB (La) (ENA) Antibody, IgG Latest Ref Range: <1.0 NEG AI 1.8 POS (A)  Scleroderma (Scl-70) (ENA) Antibody, IgG Latest Ref Range: <1.0 NEG AI <1.0 NEG    IMPRESSION: 1. Spectrum of findings compatible with fibrotic interstitial lung disease with mild honeycombing. Despite the absence of a clear basilar gradient, these findings are considered to represent probable usual interstitial pneumonia (UIP). Fibrotic phase nonspecific interstitial pneumonia  (NSIP) is on the differential. Follow-up high-resolution chest CT in 6-12 months would be useful to assess temporal pattern stability, as clinically warranted. 2. Tiny 3 mm solid left lower lobe pulmonary nodule. No follow-up needed if patient is low-risk. Non-contrast chest CT can be considered in 12 months if patient is high-risk. This recommendation follows the consensus statement: Guidelines for Management of Incidental Pulmonary Nodules Detected on CT Images:From the Fleischner Society 2017; published online before print (10.1148/radiol.7517001749). 3. Three-vessel coronary atherosclerosis. 4. Dilated main pulmonary artery, suggesting pulmonary arterial hypertension.  Aortic Atherosclerosis (ICD10-I70.0).   Electronically Signed   By: Ilona Sorrel M.D.   On: 11/13/2017 16:46   OV 02/02/2018  Chief Complaint  Patient presents with  . Follow-up    Pt states things have been up and down since last visit and since bronch was performed. Pt still has problems with upset stomach and pt is still having problems with her breathing. Pt does cough in the morning with occ. green mucus. Denies any CP/chest tightness.    Follow-up interstitial lung disease.  This follow-up is after bronchoscopy with lavage which was done Jan 07, 2018.  This shows a slight preponderance of polymorphs and absence of lymphocytes thus suggesting this is not hypersensitivity pneumonitis but with the increase in polymorphs the diagnosis shift towards UIP.  I took a second opinion on radiology from Dr. Lorin Picket and she feels a CT scan is more consistent with UIP.  In the interim patient has no new complaints.  Walking desaturation test indicates a 7% pulse ox drop with tachycardia and shortness of breath but still does not drop below 88%.  Of note she does indicate that she has had chronic intermittent stress related nausea that has been worked up extensively but without any cause.   Results for Ashlee Martin, Ashlee Martin (MRN [id number]) as of 01/07/2018 20:04  Ref. Range 01/07/2018 14:35  Color, Fluid Latest Ref Range: YELLOW  STRAW (A)  WBC, Fluid Latest Ref Range: 0 - 1,000 cu mm 73  Lymphs, Fluid Latest Units: % 6  Eos, Fluid Latest Units: % 4  Appearance, Fluid Latest Ref Range: CLEAR  CLOUDY (A)  Other Cells, Fluid Latest Units: % CORRELATE  WITH CY...  Neutrophil Count, Fluid Latest Ref Range: 0 - 25 % 55 (H)  Monocyte-Macrophage-Serous Fluid Latest Ref Range: 50 - 90 % 35 (L)     OV 03/15/2018  Chief Complaint  Patient presents with  . Follow-up    Pt began taking Esbriet but had been having problems keeping food down while taking the med. Med was stopped 7/12 by MW due to the possible reaction and was told to stop med until OV with MR. Other than the beginning problems with Esbriet, pt states she has been doing good since last visit   Ashlee Martin , 86 y.o. , with dob 1930-08-29 and female ,Not Hispanic or Latino from [address] [place] [id number] - presents to ILD  clinic for IPF diagnosis (he is aage > 70 (age 21), probable UIP on CT scan with honeycombing early, negative autoimmune and vasculitis serology and bronchoscopy 01/07/18 with 55% neutrophils on lavage) made 02/02/18 and esbriet decision taken 02/02/18   In terms of her IPF she is doing stable. She does not feel any worse. She gets dyspneic when walking out of her air conditioned room to the hot humid air she does not feel any worse. Walking desaturation test showed desaturation to 87% which seems a little bit worse than before but she denies that she is worse. She does not want to use portable oxygen but she is willing to get herself tested at night. There is no worsening cough or sputum production.  The main issue appears to be that on 03/05/2018 she started her first dose of Pirfenidone (Esbriet). She took one dose and felt fine and then 4 hours later immediately after lunch 12 her food and had nausea and vomiting. She does  not believe that the nausea and vomiting were related to the Pirfenidone (Esbriet). She insetead thinks it is a recurrence of her chronic intermittent nausea and vomiting which is had for the last few years. We took more history on this and she tells me that she's had this for the last few years. It is stable. She says primary care physician worked up extensively. However shnever  gastroenterologist or had an endoscopy for this.   OV 04/27/2018  Chief Complaint  Patient presents with  . Follow-up    Pt had endoscopy performed 8/27. Pt states she has been having problems with pain and states she still has occ SOB but that is better and has an occ cough in the mornings. Denies any CP.     Chan Rosasco Kwasny , 86 y.o. , with dob 1931/06/09 and female ,Not Hispanic or Latino from [address] [place] [address] - presents to ILD  clinic for IPF diagnosis (he is aage > 34 (age 59), probable UIP on CT scan with honeycombing early, negative autoimmune and vasculitis serology and bronchoscopy 01/07/18 with 55% neutrophils on lavage) made 02/02/18 and esbriet decision taken 02/02/18 , restart date 03/15/18   Ashlee Martin - presents for follow-up of her IPF. Currently she restarted her Pirfenidone (Esbriet) 03/15/2018 and escalated to 2 pills 3 times a day which she has held at that dose. She says that her fatigue is gone from mild pre-Pirfenidone (Esbriet) 2 severe post Pirfenidone (Esbriet). In addition she has weight loss. Her weight loss is 9 poundssince she started the Pirfenidone (Esbriet). She is also having extremely poor appetite. She did seeGI Dr. Barney Drain and had endoscopy 04/20/2018. According to the patientit sounds like she might have had  gastritis but I'm not so sure. She's also had ultrasound of the liver and is awaiting the results. She tells me 4 days up with endoscopy she did have one episodes of nausea and vomiting. She feels that the severe D of the nausea and vomiting and the frequency  of this is at baseline and she does not think the Pirfenidone (Esbriet) has made this worse. However she is categorical that the fatigue low appetite and weight loss are related to Pirfenidone (Esbriet).She is using oxygen with pulmonary rehabilitation. She is more short of breath. Walking desaturation test today shows that she did desaturate much earlier than at baseline. Also 03/31/2018 she had overnight oxygen study and the pulse ox was less than 88%  For 7-1/2 hours. She is refusing to wear portable oxygen. She has agreed to wear oxygen during rehabilitation and at night  OV 05/26/2018  Subjective:  Patient ID: Ashlee Martin, female , DOB: 1931-05-01 , age 38 y.o. , MRN: [id number] , ADDRESS: [place] [id number]   05/26/2018 -   Chief Complaint  Patient presents with  . Follow-up    PFT attempted but pt was unable to complete it. Pt has had worsening  and was admitted to the hospital 9/11-9/13 due to a UTI and O2 sats dropping to 70%. Pt states she does have a mild cough in the mornings. Denies any complaints of CP. Pt is now having to wear O2 due to her breathing worsening and is on 3L with exertion and 2L at rest.     HPI Ashlee Martin 86 y.o. -presents IPF-year follow-up.  Last seen April 27, 2018.  Just before that end of August 2019 she underwent endoscopy that showed acid reflux and peptic stricture.  According to the patient she was stretched.  She has stopped as.  After the visit April 27, 2018.  Her vomiting episodes have improved although not completely resolved..  It is unclear if this is because of Pirfenidone (Esbriet) being stopped or because of the endoscopy.  In any event May 05, 2018 she was admitted at [facility].  She brought the outside records with me.  It appears that she had E. coli UTI.  In addition her BNP was high at 1600.  She had a CT scan of the chest that I was able to visualized.  There is evidence of worsening of ILD but this  was in the form of a high BNP.  At this point in time she is better but somewhat fatigued.  She has been discharged with continuous oxygen although when we turned her oxygen off which was set at 3 L and turn it off for 20 minutes she had normal pulse ox at rest and desaturated at the first left.  So it appears that her hypoxemia is improved compared to admission but it is still worse compared to a visit 1 month ago.       ROS  OV 06/24/2018  Subjective:  Patient ID: Ashlee Martin, female , DOB: 1930-12-21 , age 54 y.o. , MRN: [id number] , ADDRESS: [place] [phone]   06/24/2018 -   Chief Complaint  Patient presents with  . Follow-up    States her breathing has been good at her basline. Stll concerned about her weight.      HPI Ashlee Martin 86 y.o. -has idiopathic pulmonary fibrosis.  Currently on supportive care of having significant side effects suspected from Pirfenidone (Esbriet).  At this point in time she is been off Pirfenidone (Esbriet) for several weeks.  She is now gained 5 pounds of weight.  Her appetite is returned to normal.  She still has an occasional mild intermittent nausea vomiting which is her baseline idiopathic problem.  She does not have any diarrhea.  She is also started using oxygen.  Without being on Esbriet and also being on oxygen she feels her quality of life is actually improved with less shortness of breath.  She and she is less miserable.  However CT scan and pulmonary function tests show slight decline in lung function with no fibrosis as documented below.  The CT scan was personally visualized by me.  She continues to have some cough for which she wants [employer]  We discussed nintedanib as an alternative option (see below().  We also discussed research as a care option but her low DLCO makes it difficult for her to qualify for IV or inhaler related studies that do not have GI side effects.   - OFEV  - - time to first  exacerbation possibly reduced in one trial but not in another - twice daily, no titration, potentially more convenient dosing  - no need for sunscreen  - high chance of mild diarrhea but low chance of significant diarrhea needing to stop medication.   - Rx diarrhea with lomotil - slight increase in heart attack risk and theoretical increase in bleeding risk,   - need monthly blood work for 3 months and then every 6 months - monitor liver function       Results for SYMIAH, NOWOTNY (MRN [id number]) as of 06/24/2018 09:25  Ref. Range 12/29/2017 12:43 06/16/2018 10:33  FVC-Pre Latest Units: L 1.49 1.54  FVC-%Pred-Pre Latest Units: % 58 60  Results for Kloos, Ashlee Martin (MRN [id number]) as of 06/24/2018 09:25  Ref. Range 12/29/2017 12:43 06/16/2018 10:33  DLCO unc Latest Units: ml/min/mmHg 7.82 6.02  DLCO unc % pred Latest Units: % 30 23   Results for Dunwoody, Ashlee Martin (MRN [id number]) as of 06/24/2018 09:25  Ref. Range 04/27/2018 15:06  Hemoglobin Latest Ref Range: 12.0 - 15.0 g/dL 13.6   HRCT 06/15/18 Lungs/Pleura: High-resolution images again demonstrate widespread areas of septal thickening, subpleural reticulation, thickening of the peribronchovascular interstitium, traction bronchiectasis and frank honeycombing. Very mild craniocaudal gradient. Findings appear slightly progressive compared to the prior study from 11/13/2017. No confluent consolidative airspace disease. No pleural effusions. Inspiratory and expiratory imaging demonstrates some mild to moderate air trapping indicative of small airways disease. No definite suspicious appearing pulmonary nodules or masses are noted. IMPRESSION: 1. Previously noted left Rocque lobe nodule has completely resolved, indicative of an infectious or inflammatory etiology on the prior study. 2. Chronic changes of interstitial lung disease again considered probable usual interstitial pneumonia (UIP) per ATS guidelines. 3. Aortic atherosclerosis, in  addition to left main and 3 vessel coronary artery disease. Please note that although the presence of coronary artery calcium documents the presence of coronary artery disease, the severity of this disease and any potential stenosis cannot be assessed on this non-gated CT examination. Assessment for potential risk factor modification, dietary therapy or pharmacologic therapy may be warranted, if clinically indicated.  Aortic Atherosclerosis (ICD10-I70.0).   Electronically Signed   By: Vinnie Langton M.D.   On: 06/15/2018 17:00    08/05/2018  - Visit   86 year old female patient completing follow-up with our office today. Pt is currently on OFEV and patient is doing well.  Patient attributes her success on the medication to to the nursing education from the pharmaceutical company.  Patient reports she has had occasional diarrhea but this is been managed well with Lomotil.  Patient feels much better on this medication than Esbriet.  Patient denies drops in oxygen and feels that she is able to do more things around the house.  Patient thinks the medication is going well.  Patient is interested in traveling to [place] as she usually does this every year.  Patient is wondering how to coordinate this with her oxygen needs now.  Patient is wondering if she could switch her DME company from [facility] to [employer].   Records are showing that patient is due for pneumonia vaccine.  Patient believes she has received [facility] at [facility] she will check her records as well as at [facility] in bring to next office visit.    06/15/18 -CT chest high-res- previously noted left Mol lobe nodules completely resolved, chronic changes of interstitial lung disease again considered probable UIP   OV 09/16/2018  Subjective:  Patient ID: Ashlee Martin, female , DOB: Ashlee Martin 28, 1932 , age 89 y.o. , MRN: [id number] , ADDRESS: [place] [phone]   09/16/2018 -   Chief Complaint  Patient  presents with  . Follow-up    Pt states she had been doing good until 4 days ago and stated she began throwing up on Sunday and also threw up Monday night as well as Tuesday and also had diarrhea. Pt states SOB is about the same as last visit.   IPF with chronic hypoxemic respiratory failure 2 L at rest.  Failed Esbriet due to GI intolerance.  Started on nintedanib early 2019 early November  HPI Naylin A Brecheisen 86 y.o. -after starting nintedanib she saw a nurse practitioner mid December 2019.  At that time she was tolerating it well.  Liver function test was fine.  She now reports for follow-up she tells me that she has new onset of diarrhea since starting nintedanib.  This happens around 2 times a week.  It happens abruptly without any warning and she wets her bed or with her self.  Otherwise the severity itself is only mild to moderate.  But the main issue is that it happens abruptly.  She responds by taking Imodium and then she does not have diarrhea for a few days till it recurs again abruptly.  In addition for the last few days she has had intermittent nausea and vomiting.  This was not an issue with nintedanib until now.  It was a big issue with Esbriet along with weight loss which made her stop the Esbriet.  She has not tried any Zofran for this.  Otherwise in terms of her IPF she feels clinically stable.  She is bothered a lot by chronic pain in her joints.  She will talk to her primary care physician about this.   OV 06/20/2019  Subjective:  Patient ID: Ashlee Martin, female , DOB: 1931/05/25 , age 23 y.o. , MRN: [id number] , ADDRESS: [place] [phone]   06/20/2019 -   Chief Complaint  Patient presents with  . Follow-up    Patient reports that she is doing well at this time.    IPF with chronic hypoxemic respiratory failure 2 L at rest.  Failed Esbriet due to GI intolerance.  Started on nintedanib early 2019 early November and stopped SEpt 2012  HPI Mayley A Palo  86 y.o. -she has had at least 2 documented options to nintedanib  in April 2020 and currentl since September 2020.  There is possibly a third interaction that happened earlier in March 2020 but she does not reembmer  Most recently saw a nurse practitioner May 30, 2019 at which time she was off her nintedanib because of diarrhea.  She reported to the nurse practitioner that after stopping the nintedanib the diarrhea had improved and her fatigue improved and her appetite also improved.  Overall quality of life is much better after stopping antifibrotic's.  And finally she started having weight gain as well.  The nintedanib has been stopped at that point till September 2020. On May 31, 2019 she underwent colonoscopy by Dr. Trinda Pascal [this was 1 day after seeing our nurse practitioner in the office] this is because of rectal bleeding.  She was noticed to have a tortuous colon and diverticulosis in the rectosigmoid colon and the transverse colon and ascending colon.  The rectal bleeding was thought secondary to internal hemorrhoids.  No colonic source of weight loss was discovered.  She was recommended high-fiber diet.  And return to the office in 6 months.  At this point in time she tells me that she does not want to go back on pirfenidone or nintedanib.  Her weight has been.  She is feeling better.  She says she lost 15 pounds of weight.  She is interested in IV infusion therapy through research protocol if offered for her IPF.  She uses 2 L of oxygen at rest and exertion.  Occasionally bumped it up to 3 L. Walking desat test shows stability x 1 y ear  Last pulmonary function test was October 2019 Last high-resolution CT chest was October 2019    OV 09/29/2019  Subjective:  Patient ID: Ashlee Martin, female , DOB: 29-May-1931 , age 26 y.o. , MRN: [id number] , ADDRESS: [place] [id number]   IPF with chronic hypoxemic respiratory failure 2 L at rest.  Failed Esbriet due to GI  intolerance.  Started on nintedanib  2019 early November and stopped finally sept 2020 with 2-3 interruptions in between   09/29/2019 -  = telephone visit for IPF.  She is on supportive care but prescriptioin o2.  She recently had PFTs and cT and this is discuss those results. Identiied with 2 PHI. She has new coronary atery calcificatioand enalarged PA (new issues for [place]) - saw Dr Johnny Bridge cards- echo has been ordered and otherwise supportive care. Breathing stable. No fatigue . No GI issues since off antifiboriics. On o2 - 2L at resting, 3L with walking and at night [facility]  WEnt over PFT - fvc stable, but dlco declined.- this is posing a challenge for research trials as care optinos for which she is interested. Either age or dlco is an issue. In future TETON study inhaler tyvaso can be an option if we are a site     OV 05/10/2020   Subjective:  Patient ID: Ashlee Martin, female , DOB: 21-Sep-1930, age 69 y.o. years. , MRN: [id number],  ADDRESS: [place] [address] PCP  Glenda Chroman, MD Providers : Treatment Team:  Attending Provider: Brand Males, MD   Chief Complaint  Patient presents with  . Follow-up    pt has had sob currently on 3 liters pulse oxygen,Sob on  exertion     IPF with chronic hypoxemic respiratory failure 2 L at rest.  Failed Esbriet due to GI intolerance.  Started on nintedanib  2019 early November and stopped finally sept 2020 with 2-3 interruptions in between.  Currently on supportive care l  Last echocardiogram March 2021 with evidence of pulmonary hypertension  HPI Dula A Olivos 86 y.o. - last seen by myself personally face-to-face October 2020.  Last interaction was a telephone visit in February 2021.  After that she had 3 telephone visit/face-to-face visits with nurse practitioner in February for back pain issues.  Then with pollen issues in June 2021 with nurse practitioner in August 2021 for follow-up with nurse practitioner.  She has  been recommended home palliative care.  She says the visiting but not of much help.  It appears since I saw her last year she is having progressive dyspnea.  Her husband tells me that when she is resting she is fine but when she stands up and tries to exert herself she wobbles and is unsteady on her feet gets very short of breath.  She is able to barely go across from one end of the room to the other.  Her symptom score reflect this and she is significantly worse.  She is very interested in clinical trials or research as a care option.  She investigated going to a stem cell transplant center in [place].  She asked my opinion.  I recommended against this approach.  She does not qualify for a current existing trials because of an extremely low DLCO.   Along with the symptoms getting worse.  She is also had pulmonary function test in Ashlee Martin 2021 which shows worsening DLCO and FVC.  Last CT scan of the chest was in January 2021.  She saw Dr. Domenic Polite in February 2021 cardiology.  After that and March 2020 when she had an echo.  There is evidence of pulmonary hypertension.  She not had a right heart cath.  Post echo in the summer 2021 inhaled treprostinil has been FDA approved although insurance is yet to pick it up for pulmonary hypertension associated with ILD [facility] group 3]  She also tells me that in Ashlee Martin 2021 when she ended up in the emergency room.  She was given Lasix and prednisone these things helped.  She is wondering about taking daily prednisone but at the same time she is wary and rejected the idea of chronic prednisone because of the long-term side effects.  SYMPTOM SCALE - ILD 06/20/2019  05/10/2020   O2 use 2-3L 3L continuous  Shortness of Breath 0 -> 5 scale with 5 being worst (score 6 If unable to do)   At rest 0 0  Simple tasks - showers, clothes change, eating, shaving 0 5  Household (dishes, doing bed, laundry) 0 3.5  Shopping 3 3 but with wheel chair  Walking level at  own pace 0 5  Walking keeping up with others of same age 31 x  Walking up Stairs na 6 - do not do  Walking up [place] na x  Total (40 - 48) Dyspnea Score x   How bad is your cough? 0 Not bad  How bad is your fatigue 2 Bad with exertin  nausea  no  vomit  no  diarreha  no  anxiety  Bad at time  deopression  badf at times        Simple office walk 185 feet x  3 laps goal with forehead probe 02/02/2018 Start esbriet 03/15/2018 contnue esbriet 04/27/2018 Stop esbriet 05/26/2018  06/20/2019  O2 used Room air Room air Room air  room air Room air  Number laps completed _0 but desat at 2nd lap  desaturated at first lap deagurated at first lap  Comments about pace nrmal pacte Normal pace Slow pace  slow pace Slow pace  Resting Pulse Ox/HR 98% and 83/min 98% and HR 85/min 96% and HR 63/min  98% and 88/min 98% and 70/min  Final Pulse Ox/HR 91% and 98/min 87% and HR 91% at end of 3rd lao 87% and HR 79 at end of 2nd lap  86% and heart rate of 95 at first lab 87% and HR 84/in  Desaturated </= 88% no yes Yes -  yes   Desaturated <= 3% points yes yes yes  yes   Got Tachycardic >/= 90/min yes yes no  yes   Symptoms at end of test dyspnea Mild dyspnea x  dyspnea   Miscellaneous comments Normal pace Normal pace Worse desats  worsening desaturation.  Corrected with 3 laps of walking 2 L similr to before   PFT Results Latest Ref Rng & Units 03/13/2020 09/01/2019 06/16/2018 12/29/2017  FVC-Pre L 1.57 1.70 1.54 1.49  FVC-Predicted Pre % 63 69 60 58  FVC-Post L 1.59 - - -  FVC-Predicted Post % 64 - - -  Pre FEV1/FVC % % 83 79 83 79  Post FEV1/FCV % % 83 - - -  FEV1-Pre L 1.30 1.35 1.27 1.18  FEV1-Predicted Pre % 71 74 66 62  FEV1-Post L 1.32 - - -  DLCO uncorrected ml/min/mmHg 4.52 5.15 6.02 7.82  DLCO UNC% % _1 DLVA Predicted % 42 48 40 53  TLC L 3.69 - - -  TLC % Predicted % 70 - - -  RV % Predicted % 75 - - -    IMPRESSION: 1. Pulmonary parenchymal pattern of fibrosis is unchanged  from 11/13/2017 and likely due to usual interstitial pneumonitis. Findings are categorized as probable UIP per consensus guidelines: Diagnosis of Idiopathic Pulmonary Fibrosis: An Official ATS/ERS/JRS/ALAT Clinical Practice Guideline. [place], Iss 5, [phone], Apr 25 2017. 2. Distal aortic arch aneurysm. Aortic aneurysm NOS (ICD10-I71.9). Recommend annual imaging followup by CTA or MRA. This recommendation follows 2010 ACCF/AHA/AATS/ACR/ASA/SCA/SCAI/SIR/STS/SVM Guidelines for the Diagnosis and Management of Patients with Thoracic Aortic Disease. Circulation.2010; 121: U384-T364. Aortic aneurysm NOS (ICD10-I71.9). 3. Marginal irregularity the liver is indicative of cirrhosis. 4. Aortic atherosclerosis (ICD10-I70.0). Coronary artery calcification. 5. Enlarged pulmonic trunk, indicative of pulmonary arterial hypertension.   Electronically Signed   By: Lorin Picket M.D.   On: 09/07/2019 13:59 ROS - per HPI     has a past medical history of Arthritis, Coronary artery calcification seen on CT scan, Depression, Essential hypertension, GERD (gastroesophageal reflux disease), History of seizures, Hypercholesterolemia, ILD (interstitial lung disease) ([place]), and Psoriasis.   reports that she quit smoking about 46 years ago. Her smoking use included cigarettes. She has a 60.00 pack-year smoking history. She has never used smokeless tobacco  OV 05/10/2020     has a past medical history of Arthritis, Coronary artery calcification seen on CT scan, Depression, Essential hypertension, GERD (gastroesophageal reflux disease), History of seizures, Hypercholesterolemia, ILD (interstitial lung disease) ([place]), and Psoriasis.   reports that she quit smoking about 47 years ago. Her smoking use included cigarettes. She has a 60.00 pack-year smoking history. She has never used smokeless tobacco.  Past Surgical History:  Procedure Laterality Date  . BIOPSY  04/20/2018  Procedure: BIOPSY;  Surgeon: Danie Binder, MD;  [facility];  Service: Endoscopy;;  gastric  . COLONOSCOPY  01/2011   Dr. Anthony Sar: normal.   . COLONOSCOPY WITH PROPOFOL N/A 05/31/2019   normal TI, multiple small and large-mouthed diverticula in recto-sgimoid, sigmoid, transverse  . DILATION AND CURETTAGE OF UTERUS     x3  . ESOPHAGOGASTRODUODENOSCOPY (EGD) WITH PROPOFOL N/A 04/20/2018   benign-appearing, intrinsic mild stenosis, s/p dilation. Mild inflammation entire stomach, s/p biopsy that was negative for H.pylori. Pylorus normal but had changes consistent with PRIOR ulcer disease. Duodenum normal.   . ESOPHAGOGASTRODUODENOSCOPY (EGD) WITH PROPOFOL N/A 05/31/2019   mild gastirits, findings on CT likely due to sliding hiatal hernia, benign-appearing esophageal stricture due to GERD.  Marland Kitchen SAVORY DILATION N/A 04/20/2018   Procedure: SAVORY DILATION;  Surgeon: Danie Binder, MD;  [facility];  Service: Endoscopy;  Laterality: N/A;  . VIDEO BRONCHOSCOPY Bilateral 01/07/2018   Procedure: VIDEO BRONCHOSCOPY WITHOUT FLUORO;  Surgeon: Brand Males, MD;  Location: [facility];  Service: Cardiopulmonary;  Laterality: Bilateral;  . WRIST SURGERY Right    ORIF    Allergies  Allergen Reactions  . Humira [Adalimumab] Other (See Comments)    Worsened your psoriasis.  . Pirfenidone Other (See Comments)    Fatigue and weight loss    Immunization History  Administered Date(s) Administered  . Fluad Quad(high Dose 65+) 05/10/2019  . Influenza, High Dose Seasonal PF 06/10/2017, 04/27/2018, 05/10/2020  . Moderna SARS-COVID-2 Vaccination 10/12/2019, 11/11/2019, 12/12/2019  . Pneumococcal Conjugate-13 12/09/2014, 09/05/2018    Family History  Problem Relation Age of Onset  . Heart disease Mother   . Heart disease Father   . Cancer Sister        GYN  . Colon cancer Neg Hx      Current Outpatient Medications:  .  aspirin EC 81 MG tablet, Take 1 tablet (81 mg total)  by mouth daily., Disp: 90 tablet, Rfl: 3 .  atenolol (TENORMIN) 25 MG tablet, Take 25 mg by mouth 2 (two) times daily. , Disp: , Rfl:  .  BIOTIN PO, Take by mouth., Disp: , Rfl:  .  Cholecalciferol (VITAMIN D-3) 125 MCG (5000 UT) TABS, Take by mouth daily., Disp: , Rfl:  .  diphenhydrAMINE HCl, Sleep, (SLEEP AID, DIPHENHYDRAMINE, PO), Take 50 mg by mouth as needed., Disp: , Rfl:  .  fexofenadine (ALLEGRA) 180 MG tablet, Take 180 mg by mouth 2 (two) times daily., Disp: , Rfl:  .  lactobacillus acidophilus (BACID) TABS tablet, Take 1 tablet by mouth daily., Disp: , Rfl:  .  loratadine (CLARITIN) 10 MG tablet, Take 10 mg by mouth at bedtime., Disp: , Rfl:  .  mirtazapine (REMERON) 7.5 MG tablet, Take 7.5 mg by mouth at bedtime., Disp: , Rfl:  .  Naproxen Sod-diphenhydrAMINE (ALEVE PM PO), Take by mouth as needed., Disp: , Rfl:  .  pantoprazole (PROTONIX) 40 MG tablet, TAKE 1 TABLET (40 MG TOTAL) BY MOUTH 2 (TWO) TIMES DAILY BEFORE A MEAL., Disp: 180 tablet, Rfl: 1 .  Phenylephrine-APAP-guaiFENesin (MUCINEX SINUS-MAX CONGESTION PO), Take by mouth daily., Disp: , Rfl:  .  potassium chloride (K-DUR) 10 MEQ tablet, Take 10 mEq by mouth daily. , Disp: , Rfl:  .  simvastatin (ZOCOR) 20 MG tablet, Take 20 mg by mouth at bedtime. , Disp: , Rfl:  .  sodium chloride HYPERTONIC 3 % nebulizer solution, Take by nebulization 2 (two) times daily., Disp: 750 mL, Rfl: 3 .  vitamin B-12 (CYANOCOBALAMIN) 500 MCG tablet, Take 500 mcg by mouth daily., Disp: , Rfl:       Objective:   Vitals:   05/10/20 1058  BP: 110/60  Pulse: 64  Temp: 97.8 F (36.6 C)  TempSrc: Oral  SpO2: 90%  Weight: 146 lb 3.2 oz (66.3 kg)  Height: _0  (1.651 m)    Estimated body mass index is 24.33 kg/m as calculated from the following:   Height as of this encounter: _1  (1.651 m).   Weight as of this encounter: 146 lb 3.2 oz (66.3 kg).  _2 @  Filed Weights   05/10/20 1058  Weight: 146 lb 3.2 oz (66.3 kg)      Physical Exam Elderly frail female sitting in the wheelchair.  Oxygen on.  Extensive bilateral crackles present.  Alert and oriented x3.  Deconditioned looking.  Normal heart sounds abdomen soft mild edema present no stigmata of connective tissue disease         Assessment:       ICD-10-CM   1. Chronic respiratory failure with hypoxia (HCC)  J96.11   2. IPF (idiopathic pulmonary fibrosis) ([place])  J84.112   3. 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 Physical deconditioning  R53.81        Plan:     Patient Instructions     ICD-10-CM   1. Chronic respiratory failure with hypoxia (HCC)  J96.11   2. IPF (idiopathic pulmonary fibrosis) ([place])  J84.112   3. 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 Physical deconditioning  R53.81    I am concerned that you are having progressive pulmonary fibrosis You are also significantly physically deconditioned I agree with you that long-term prednisone can be harmful for you Noted that you do not tolerate either pirfenidone or nintedanib and you are on supportive care  Plan -Overall continue supportive care -Use oxygen 3 L at rest and titrate as needed when you exert yourself for pulse ox goal greater than 86% -Refer home physical therapy for physical conditioning -I will send a message to Dr. Domenic Polite to get right heart catheterization (recently nebulizer called treprostinil was approved in your situation although insurance has not picked it up yet] - I will have the research team screen you for an upcoming study called BELLEROPHON pulsed nitric oxide device study -this is suitable for patients with advanced lung disease and fibrosis  -Might be a few months before we get started  -I do not recommend plasma treatment or stem cell treatment -Continue home palliative care   Follow-up -30-minute office visit in 3 months  -ILD symptom questionnaire at follow-up     ( Level 05 visit: Estb 40-54 min  + additional 30 minn  visit type:  on-site physical face to visit  in total care time and counseling or/and coordination of care by this undersigned MD - Dr Brand Males. This includes one or more of the following on this same day 05/10/2020: pre-charting, chart review, note writing, documentation discussion of test results, diagnostic or treatment recommendations, prognosis, risks and benefits of management options, instructions, education, compliance or risk-factor reduction. It excludes time spent by the [place] or office staff in the care of the patient. Actual time 35 min)   SIGNATURE    Dr. Brand Males, M.D., F.C.C.P,  Pulmonary and Critical Care Medicine Staff Physician, [place] Director - Interstitial Lung Disease  Program  Pulmonary [place] [facility] [place], [place],  [place]  Pager: [phone], If no answer or between  15:00h - 7:00h: call [phone] Telephone: [phone]  12:56 PM 05/10/2020

## 2020-05-10 NOTE — Telephone Encounter (Signed)
Thank you for the communication and update.  To be honest, the best way to get this accomplished typically is to have patient evaluated in the advanced heart failure clinic for assessment of pulmonary hypertension and arrangement of a right heart catheterization.  We will set up that consultation and request from you.

## 2020-05-10 NOTE — Patient Instructions (Signed)
ICD-10-CM   1. Chronic respiratory failure with hypoxia (HCC)  J96.11   2. IPF (idiopathic pulmonary fibrosis) ([place])  J84.112   3. 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 Physical deconditioning  R53.81    I am concerned that you are having progressive pulmonary fibrosis You are also significantly physically deconditioned I agree with you that long-term prednisone can be harmful for you Noted that you do not tolerate either pirfenidone or nintedanib and you are on supportive care  Plan -Overall continue supportive care -Use oxygen 3 L at rest and titrate as needed when you exert yourself for pulse ox goal greater than 86% -Refer home physical therapy for physical conditioning -I will send a message to Dr. Domenic Polite to get right heart catheterization (recently nebulizer called treprostinil was approved in your situation although insurance has not picked it up yet] - I will have the research team screen you for an upcoming study called BELLEROPHON pulsed nitric oxide device study -this is suitable for patients with advanced lung disease and fibrosis  -Might be a few months before we get started  -I do not recommend plasma treatment or stem cell treatment -Continue home palliative care   Follow-up -30-minute office visit in 3 months  -ILD symptom questionnaire at follow-up

## 2020-05-10 NOTE — Telephone Encounter (Signed)
Dear Dr. Domenic Polite  Recently inhaled treprostinil has been approved by the FDA for WHO group 3 pulmonary hypertension associated with interstitial lung disease.  Patient has evidence of this in the echocardiogram in March 2021  Plan-would you be kind enough to arrange for a right heart catheterization?  Key numbers needed  REW-indication of right heart catheterization associated with interstitial lung disease.  PVR  pulmonary capillary wedge pressure , and pulmonary artery mean pressure  tHANKS  mr

## 2020-05-10 NOTE — Telephone Encounter (Signed)
Thank you much. Will close message.

## 2020-05-17 DIAGNOSIS — M546 Pain in thoracic spine: Secondary | ICD-10-CM | POA: Diagnosis not present

## 2020-05-17 DIAGNOSIS — E785 Hyperlipidemia, unspecified: Secondary | ICD-10-CM | POA: Diagnosis not present

## 2020-05-17 DIAGNOSIS — J8410 Pulmonary fibrosis, unspecified: Secondary | ICD-10-CM | POA: Diagnosis not present

## 2020-05-17 DIAGNOSIS — J449 Chronic obstructive pulmonary disease, unspecified: Secondary | ICD-10-CM | POA: Diagnosis not present

## 2020-05-17 DIAGNOSIS — K219 Gastro-esophageal reflux disease without esophagitis: Secondary | ICD-10-CM | POA: Diagnosis not present

## 2020-05-17 DIAGNOSIS — J84112 Idiopathic pulmonary fibrosis: Secondary | ICD-10-CM | POA: Diagnosis not present

## 2020-05-17 DIAGNOSIS — I129 Hypertensive chronic kidney disease with stage 1 through stage 4 chronic kidney disease, or unspecified chronic kidney disease: Secondary | ICD-10-CM | POA: Diagnosis not present

## 2020-05-17 DIAGNOSIS — J9611 Chronic respiratory failure with hypoxia: Secondary | ICD-10-CM | POA: Diagnosis not present

## 2020-05-17 DIAGNOSIS — G2581 Restless legs syndrome: Secondary | ICD-10-CM | POA: Diagnosis not present

## 2020-05-17 DIAGNOSIS — N183 Chronic kidney disease, stage 3 unspecified: Secondary | ICD-10-CM | POA: Diagnosis not present

## 2020-05-17 DIAGNOSIS — Z515 Encounter for palliative care: Secondary | ICD-10-CM | POA: Diagnosis not present

## 2020-05-17 DIAGNOSIS — G8929 Other chronic pain: Secondary | ICD-10-CM | POA: Diagnosis not present

## 2020-05-18 ENCOUNTER — Telehealth: Payer: Self-pay | Admitting: Internal Medicine

## 2020-05-18 DIAGNOSIS — S6981XA Other specified injuries of right wrist, hand and finger(s), initial encounter: Secondary | ICD-10-CM | POA: Diagnosis not present

## 2020-05-18 DIAGNOSIS — M25531 Pain in right wrist: Secondary | ICD-10-CM | POA: Diagnosis not present

## 2020-05-18 DIAGNOSIS — M25431 Effusion, right wrist: Secondary | ICD-10-CM | POA: Diagnosis not present

## 2020-05-18 DIAGNOSIS — J84112 Idiopathic pulmonary fibrosis: Secondary | ICD-10-CM

## 2020-05-18 NOTE — Telephone Encounter (Signed)
Spoke with the pt  She is checking on referral for Home PT  Looks like order was never placed for this  I have placed the order now  Nothing further needed

## 2020-05-22 ENCOUNTER — Telehealth: Payer: Self-pay | Admitting: Internal Medicine

## 2020-05-23 NOTE — Telephone Encounter (Signed)
Called stephanie with [facility] and she said the order or referral was for physical therapy and not for hospice care.    Order placed : Note   Please evaluate Morena A Ponds for admission to [facility].  Disciplines requested:PT  Services to provide: PT  Physician to follow patient's care (the person listed here will be responsible for signing ongoing orders): Referring Provider:Dr [facility] of Care Date: Tomorrow  I certify that this patient is under my care and that I, or a Nurse Practitioner or Physician's Assistant working with me, had a face-to-face encounter that meets the physician face-to-face requirements with patient on 05/09/20. The encounter with the patient was in whole, or in part for the following medical condition(s) which is the primary reason for home health care (List medical condition). ILD  Special Instructions:  Home PT     [facility] please advise or call Colletta [place] back with why this might have been sent to her facility

## 2020-05-24 DIAGNOSIS — I10 Essential (primary) hypertension: Secondary | ICD-10-CM | POA: Diagnosis not present

## 2020-05-24 DIAGNOSIS — E785 Hyperlipidemia, unspecified: Secondary | ICD-10-CM | POA: Diagnosis not present

## 2020-05-24 NOTE — Telephone Encounter (Signed)
Sending PT order to DME.  Pt is on palliative care only.

## 2020-05-24 NOTE — Telephone Encounter (Signed)
I am working on this one.

## 2020-05-31 ENCOUNTER — Ambulatory Visit: Payer: Medicare PPO | Admitting: Gastroenterology

## 2020-05-31 DIAGNOSIS — M19031 Primary osteoarthritis, right wrist: Secondary | ICD-10-CM | POA: Diagnosis not present

## 2020-05-31 DIAGNOSIS — Z9889 Other specified postprocedural states: Secondary | ICD-10-CM | POA: Diagnosis not present

## 2020-05-31 DIAGNOSIS — M19041 Primary osteoarthritis, right hand: Secondary | ICD-10-CM | POA: Diagnosis not present

## 2020-05-31 DIAGNOSIS — S66811A Strain of other specified muscles, fascia and tendons at wrist and hand level, right hand, initial encounter: Secondary | ICD-10-CM | POA: Diagnosis not present

## 2020-05-31 DIAGNOSIS — M24841 Other specific joint derangements of right hand, not elsewhere classified: Secondary | ICD-10-CM | POA: Diagnosis not present

## 2020-06-05 DIAGNOSIS — E7800 Pure hypercholesterolemia, unspecified: Secondary | ICD-10-CM | POA: Diagnosis not present

## 2020-06-05 DIAGNOSIS — I2510 Atherosclerotic heart disease of native coronary artery without angina pectoris: Secondary | ICD-10-CM | POA: Diagnosis not present

## 2020-06-05 DIAGNOSIS — N189 Chronic kidney disease, unspecified: Secondary | ICD-10-CM | POA: Diagnosis not present

## 2020-06-05 DIAGNOSIS — G40909 Epilepsy, unspecified, not intractable, without status epilepticus: Secondary | ICD-10-CM | POA: Diagnosis not present

## 2020-06-05 DIAGNOSIS — F32A Depression, unspecified: Secondary | ICD-10-CM | POA: Diagnosis not present

## 2020-06-05 DIAGNOSIS — J9611 Chronic respiratory failure with hypoxia: Secondary | ICD-10-CM | POA: Diagnosis not present

## 2020-06-05 DIAGNOSIS — J84112 Idiopathic pulmonary fibrosis: Secondary | ICD-10-CM | POA: Diagnosis not present

## 2020-06-05 DIAGNOSIS — K219 Gastro-esophageal reflux disease without esophagitis: Secondary | ICD-10-CM | POA: Diagnosis not present

## 2020-06-05 DIAGNOSIS — I129 Hypertensive chronic kidney disease with stage 1 through stage 4 chronic kidney disease, or unspecified chronic kidney disease: Secondary | ICD-10-CM | POA: Diagnosis not present

## 2020-06-06 DIAGNOSIS — J849 Interstitial pulmonary disease, unspecified: Secondary | ICD-10-CM | POA: Diagnosis not present

## 2020-06-06 DIAGNOSIS — R0902 Hypoxemia: Secondary | ICD-10-CM | POA: Diagnosis not present

## 2020-06-06 DIAGNOSIS — Z96649 Presence of unspecified artificial hip joint: Secondary | ICD-10-CM | POA: Diagnosis not present

## 2020-06-06 DIAGNOSIS — M1611 Unilateral primary osteoarthritis, right hip: Secondary | ICD-10-CM | POA: Diagnosis not present

## 2020-06-12 ENCOUNTER — Telehealth: Payer: Self-pay | Admitting: Internal Medicine

## 2020-06-13 NOTE — Telephone Encounter (Signed)
Spoke tot [place] with [facility] is requesting verbal for PT weekly x4 weeks.  MR, please advise. Thanks

## 2020-06-13 NOTE — Telephone Encounter (Signed)
Ashlee Martin [facility] is returning phone call. Ashlee Martin phone number is [phone].

## 2020-06-13 NOTE — Telephone Encounter (Signed)
Lm for Ashlee Martin with [facility].

## 2020-06-14 NOTE — Telephone Encounter (Signed)
Attempted to call Jenny Reichmann with [facility] but it is after 5 and I was unable to get through.  Will call back 06/15/20 to give verbal orders.

## 2020-06-14 NOTE — Telephone Encounter (Signed)
This is fine 

## 2020-06-15 DIAGNOSIS — S6981XD Other specified injuries of right wrist, hand and finger(s), subsequent encounter: Secondary | ICD-10-CM | POA: Diagnosis not present

## 2020-06-15 NOTE — Telephone Encounter (Signed)
Spoke to [place] with [facility] and gave verbal for PT weekly x4 weeks.  Nothing further needed.

## 2020-06-21 DIAGNOSIS — I517 Cardiomegaly: Secondary | ICD-10-CM | POA: Diagnosis not present

## 2020-06-21 DIAGNOSIS — I214 Non-ST elevation (NSTEMI) myocardial infarction: Secondary | ICD-10-CM | POA: Diagnosis not present

## 2020-06-21 DIAGNOSIS — J9611 Chronic respiratory failure with hypoxia: Secondary | ICD-10-CM | POA: Diagnosis not present

## 2020-06-21 DIAGNOSIS — I248 Other forms of acute ischemic heart disease: Secondary | ICD-10-CM | POA: Diagnosis not present

## 2020-06-21 DIAGNOSIS — E7800 Pure hypercholesterolemia, unspecified: Secondary | ICD-10-CM | POA: Diagnosis not present

## 2020-06-21 DIAGNOSIS — E785 Hyperlipidemia, unspecified: Secondary | ICD-10-CM | POA: Diagnosis not present

## 2020-06-21 DIAGNOSIS — R5381 Other malaise: Secondary | ICD-10-CM | POA: Diagnosis not present

## 2020-06-21 DIAGNOSIS — R0600 Dyspnea, unspecified: Secondary | ICD-10-CM | POA: Diagnosis not present

## 2020-06-21 DIAGNOSIS — J439 Emphysema, unspecified: Secondary | ICD-10-CM | POA: Diagnosis not present

## 2020-06-21 DIAGNOSIS — I10 Essential (primary) hypertension: Secondary | ICD-10-CM | POA: Diagnosis not present

## 2020-06-21 DIAGNOSIS — Z20822 Contact with and (suspected) exposure to covid-19: Secondary | ICD-10-CM | POA: Diagnosis not present

## 2020-06-21 DIAGNOSIS — R0902 Hypoxemia: Secondary | ICD-10-CM | POA: Diagnosis not present

## 2020-06-21 DIAGNOSIS — I2781 Cor pulmonale (chronic): Secondary | ICD-10-CM | POA: Diagnosis not present

## 2020-06-21 DIAGNOSIS — I129 Hypertensive chronic kidney disease with stage 1 through stage 4 chronic kidney disease, or unspecified chronic kidney disease: Secondary | ICD-10-CM | POA: Diagnosis not present

## 2020-06-21 DIAGNOSIS — J449 Chronic obstructive pulmonary disease, unspecified: Secondary | ICD-10-CM | POA: Diagnosis not present

## 2020-06-21 DIAGNOSIS — J849 Interstitial pulmonary disease, unspecified: Secondary | ICD-10-CM | POA: Diagnosis not present

## 2020-06-21 DIAGNOSIS — R7989 Other specified abnormal findings of blood chemistry: Secondary | ICD-10-CM | POA: Diagnosis not present

## 2020-06-21 DIAGNOSIS — R0602 Shortness of breath: Secondary | ICD-10-CM | POA: Diagnosis not present

## 2020-06-21 DIAGNOSIS — I350 Nonrheumatic aortic (valve) stenosis: Secondary | ICD-10-CM | POA: Diagnosis not present

## 2020-06-21 DIAGNOSIS — Z87891 Personal history of nicotine dependence: Secondary | ICD-10-CM | POA: Diagnosis not present

## 2020-06-21 DIAGNOSIS — R52 Pain, unspecified: Secondary | ICD-10-CM | POA: Diagnosis not present

## 2020-06-21 DIAGNOSIS — N183 Chronic kidney disease, stage 3 unspecified: Secondary | ICD-10-CM | POA: Diagnosis not present

## 2020-06-22 ENCOUNTER — Telehealth (HOSPITAL_COMMUNITY): Payer: Self-pay | Admitting: Vascular Surgery

## 2020-06-22 ENCOUNTER — Telehealth: Payer: Self-pay | Admitting: Internal Medicine

## 2020-06-22 DIAGNOSIS — R0902 Hypoxemia: Secondary | ICD-10-CM | POA: Diagnosis not present

## 2020-06-22 DIAGNOSIS — E785 Hyperlipidemia, unspecified: Secondary | ICD-10-CM | POA: Diagnosis not present

## 2020-06-22 DIAGNOSIS — I10 Essential (primary) hypertension: Secondary | ICD-10-CM | POA: Diagnosis not present

## 2020-06-22 NOTE — Telephone Encounter (Signed)
FYI- pt missed her PT appt this wk

## 2020-06-22 NOTE — Telephone Encounter (Signed)
Left pt message to make new chf appt w/ either doctor / [facility]

## 2020-06-23 DIAGNOSIS — J9611 Chronic respiratory failure with hypoxia: Secondary | ICD-10-CM | POA: Diagnosis not present

## 2020-06-23 DIAGNOSIS — J849 Interstitial pulmonary disease, unspecified: Secondary | ICD-10-CM | POA: Diagnosis not present

## 2020-06-23 DIAGNOSIS — R0602 Shortness of breath: Secondary | ICD-10-CM | POA: Diagnosis not present

## 2020-06-23 DIAGNOSIS — N183 Chronic kidney disease, stage 3 unspecified: Secondary | ICD-10-CM | POA: Diagnosis not present

## 2020-06-24 DIAGNOSIS — J849 Interstitial pulmonary disease, unspecified: Secondary | ICD-10-CM | POA: Diagnosis not present

## 2020-06-24 DIAGNOSIS — I214 Non-ST elevation (NSTEMI) myocardial infarction: Secondary | ICD-10-CM | POA: Diagnosis not present

## 2020-06-24 DIAGNOSIS — R0602 Shortness of breath: Secondary | ICD-10-CM | POA: Diagnosis not present

## 2020-06-24 DIAGNOSIS — J9611 Chronic respiratory failure with hypoxia: Secondary | ICD-10-CM | POA: Diagnosis not present

## 2020-06-24 DIAGNOSIS — N183 Chronic kidney disease, stage 3 unspecified: Secondary | ICD-10-CM | POA: Diagnosis not present

## 2020-06-27 ENCOUNTER — Other Ambulatory Visit: Payer: Self-pay

## 2020-06-27 NOTE — Patient Outreach (Signed)
[facility]) Care Management  06/27/2020  Ashlee Martin 06-Jan-1931 [id number]   Referral Date: 06/27/20 Referral Source: [employer] Report Date of Discharge: 06/25/20 Facility:  [facility] Insurance: [facility]   Referral received. No outreach warranted at this time. Transition of Care will be completed by primary care provider office who will refer to [facility] care management if needed.  Plan: RN CM will close case.  Jone Baseman, RN, MSN [place] Management Care Management Coordinator Direct Line [phone] Cell [phone] Toll Free: [phone]  Fax: [phone]

## 2020-06-28 DIAGNOSIS — G40909 Epilepsy, unspecified, not intractable, without status epilepticus: Secondary | ICD-10-CM | POA: Diagnosis not present

## 2020-06-28 DIAGNOSIS — I2510 Atherosclerotic heart disease of native coronary artery without angina pectoris: Secondary | ICD-10-CM | POA: Diagnosis not present

## 2020-06-28 DIAGNOSIS — E7800 Pure hypercholesterolemia, unspecified: Secondary | ICD-10-CM | POA: Diagnosis not present

## 2020-06-28 DIAGNOSIS — I129 Hypertensive chronic kidney disease with stage 1 through stage 4 chronic kidney disease, or unspecified chronic kidney disease: Secondary | ICD-10-CM | POA: Diagnosis not present

## 2020-06-28 DIAGNOSIS — J9611 Chronic respiratory failure with hypoxia: Secondary | ICD-10-CM | POA: Diagnosis not present

## 2020-06-28 DIAGNOSIS — K219 Gastro-esophageal reflux disease without esophagitis: Secondary | ICD-10-CM | POA: Diagnosis not present

## 2020-06-28 DIAGNOSIS — F32A Depression, unspecified: Secondary | ICD-10-CM | POA: Diagnosis not present

## 2020-06-28 DIAGNOSIS — N189 Chronic kidney disease, unspecified: Secondary | ICD-10-CM | POA: Diagnosis not present

## 2020-06-28 DIAGNOSIS — J84112 Idiopathic pulmonary fibrosis: Secondary | ICD-10-CM | POA: Diagnosis not present

## 2020-06-29 DIAGNOSIS — N189 Chronic kidney disease, unspecified: Secondary | ICD-10-CM | POA: Diagnosis not present

## 2020-06-29 DIAGNOSIS — G40909 Epilepsy, unspecified, not intractable, without status epilepticus: Secondary | ICD-10-CM | POA: Diagnosis not present

## 2020-06-29 DIAGNOSIS — J9611 Chronic respiratory failure with hypoxia: Secondary | ICD-10-CM | POA: Diagnosis not present

## 2020-06-29 DIAGNOSIS — I2510 Atherosclerotic heart disease of native coronary artery without angina pectoris: Secondary | ICD-10-CM | POA: Diagnosis not present

## 2020-06-29 DIAGNOSIS — F32A Depression, unspecified: Secondary | ICD-10-CM | POA: Diagnosis not present

## 2020-06-29 DIAGNOSIS — E7800 Pure hypercholesterolemia, unspecified: Secondary | ICD-10-CM | POA: Diagnosis not present

## 2020-06-29 DIAGNOSIS — I129 Hypertensive chronic kidney disease with stage 1 through stage 4 chronic kidney disease, or unspecified chronic kidney disease: Secondary | ICD-10-CM | POA: Diagnosis not present

## 2020-06-29 DIAGNOSIS — J84112 Idiopathic pulmonary fibrosis: Secondary | ICD-10-CM | POA: Diagnosis not present

## 2020-06-29 DIAGNOSIS — K219 Gastro-esophageal reflux disease without esophagitis: Secondary | ICD-10-CM | POA: Diagnosis not present

## 2020-07-02 DIAGNOSIS — J8410 Pulmonary fibrosis, unspecified: Secondary | ICD-10-CM | POA: Diagnosis not present

## 2020-07-02 DIAGNOSIS — J449 Chronic obstructive pulmonary disease, unspecified: Secondary | ICD-10-CM | POA: Diagnosis not present

## 2020-07-02 DIAGNOSIS — Z9981 Dependence on supplemental oxygen: Secondary | ICD-10-CM | POA: Diagnosis not present

## 2020-07-03 DIAGNOSIS — G40909 Epilepsy, unspecified, not intractable, without status epilepticus: Secondary | ICD-10-CM | POA: Diagnosis not present

## 2020-07-03 DIAGNOSIS — J9611 Chronic respiratory failure with hypoxia: Secondary | ICD-10-CM | POA: Diagnosis not present

## 2020-07-03 DIAGNOSIS — I129 Hypertensive chronic kidney disease with stage 1 through stage 4 chronic kidney disease, or unspecified chronic kidney disease: Secondary | ICD-10-CM | POA: Diagnosis not present

## 2020-07-03 DIAGNOSIS — J84112 Idiopathic pulmonary fibrosis: Secondary | ICD-10-CM | POA: Diagnosis not present

## 2020-07-03 DIAGNOSIS — E7800 Pure hypercholesterolemia, unspecified: Secondary | ICD-10-CM | POA: Diagnosis not present

## 2020-07-03 DIAGNOSIS — I2510 Atherosclerotic heart disease of native coronary artery without angina pectoris: Secondary | ICD-10-CM | POA: Diagnosis not present

## 2020-07-03 DIAGNOSIS — N189 Chronic kidney disease, unspecified: Secondary | ICD-10-CM | POA: Diagnosis not present

## 2020-07-03 DIAGNOSIS — F32A Depression, unspecified: Secondary | ICD-10-CM | POA: Diagnosis not present

## 2020-07-03 DIAGNOSIS — K219 Gastro-esophageal reflux disease without esophagitis: Secondary | ICD-10-CM | POA: Diagnosis not present

## 2020-07-04 DIAGNOSIS — N189 Chronic kidney disease, unspecified: Secondary | ICD-10-CM | POA: Diagnosis not present

## 2020-07-04 DIAGNOSIS — I129 Hypertensive chronic kidney disease with stage 1 through stage 4 chronic kidney disease, or unspecified chronic kidney disease: Secondary | ICD-10-CM | POA: Diagnosis not present

## 2020-07-04 DIAGNOSIS — J84112 Idiopathic pulmonary fibrosis: Secondary | ICD-10-CM | POA: Diagnosis not present

## 2020-07-04 DIAGNOSIS — E7800 Pure hypercholesterolemia, unspecified: Secondary | ICD-10-CM | POA: Diagnosis not present

## 2020-07-04 DIAGNOSIS — J9611 Chronic respiratory failure with hypoxia: Secondary | ICD-10-CM | POA: Diagnosis not present

## 2020-07-04 DIAGNOSIS — I2510 Atherosclerotic heart disease of native coronary artery without angina pectoris: Secondary | ICD-10-CM | POA: Diagnosis not present

## 2020-07-04 DIAGNOSIS — G40909 Epilepsy, unspecified, not intractable, without status epilepticus: Secondary | ICD-10-CM | POA: Diagnosis not present

## 2020-07-04 DIAGNOSIS — K219 Gastro-esophageal reflux disease without esophagitis: Secondary | ICD-10-CM | POA: Diagnosis not present

## 2020-07-04 DIAGNOSIS — F32A Depression, unspecified: Secondary | ICD-10-CM | POA: Diagnosis not present

## 2020-07-05 DIAGNOSIS — K219 Gastro-esophageal reflux disease without esophagitis: Secondary | ICD-10-CM | POA: Diagnosis not present

## 2020-07-05 DIAGNOSIS — J9611 Chronic respiratory failure with hypoxia: Secondary | ICD-10-CM | POA: Diagnosis not present

## 2020-07-05 DIAGNOSIS — I2510 Atherosclerotic heart disease of native coronary artery without angina pectoris: Secondary | ICD-10-CM | POA: Diagnosis not present

## 2020-07-05 DIAGNOSIS — N189 Chronic kidney disease, unspecified: Secondary | ICD-10-CM | POA: Diagnosis not present

## 2020-07-05 DIAGNOSIS — I214 Non-ST elevation (NSTEMI) myocardial infarction: Secondary | ICD-10-CM | POA: Diagnosis not present

## 2020-07-05 DIAGNOSIS — I129 Hypertensive chronic kidney disease with stage 1 through stage 4 chronic kidney disease, or unspecified chronic kidney disease: Secondary | ICD-10-CM | POA: Diagnosis not present

## 2020-07-05 DIAGNOSIS — I2699 Other pulmonary embolism without acute cor pulmonale: Secondary | ICD-10-CM | POA: Diagnosis not present

## 2020-07-05 DIAGNOSIS — J84112 Idiopathic pulmonary fibrosis: Secondary | ICD-10-CM | POA: Diagnosis not present

## 2020-07-05 DIAGNOSIS — G40909 Epilepsy, unspecified, not intractable, without status epilepticus: Secondary | ICD-10-CM | POA: Diagnosis not present

## 2020-07-07 DIAGNOSIS — M1611 Unilateral primary osteoarthritis, right hip: Secondary | ICD-10-CM | POA: Diagnosis not present

## 2020-07-07 DIAGNOSIS — J849 Interstitial pulmonary disease, unspecified: Secondary | ICD-10-CM | POA: Diagnosis not present

## 2020-07-07 DIAGNOSIS — Z96649 Presence of unspecified artificial hip joint: Secondary | ICD-10-CM | POA: Diagnosis not present

## 2020-07-07 DIAGNOSIS — R0902 Hypoxemia: Secondary | ICD-10-CM | POA: Diagnosis not present

## 2020-07-10 DIAGNOSIS — I129 Hypertensive chronic kidney disease with stage 1 through stage 4 chronic kidney disease, or unspecified chronic kidney disease: Secondary | ICD-10-CM | POA: Diagnosis not present

## 2020-07-10 DIAGNOSIS — J84112 Idiopathic pulmonary fibrosis: Secondary | ICD-10-CM | POA: Diagnosis not present

## 2020-07-10 DIAGNOSIS — J9611 Chronic respiratory failure with hypoxia: Secondary | ICD-10-CM | POA: Diagnosis not present

## 2020-07-10 DIAGNOSIS — I214 Non-ST elevation (NSTEMI) myocardial infarction: Secondary | ICD-10-CM | POA: Diagnosis not present

## 2020-07-10 DIAGNOSIS — I2699 Other pulmonary embolism without acute cor pulmonale: Secondary | ICD-10-CM | POA: Diagnosis not present

## 2020-07-10 DIAGNOSIS — G40909 Epilepsy, unspecified, not intractable, without status epilepticus: Secondary | ICD-10-CM | POA: Diagnosis not present

## 2020-07-10 DIAGNOSIS — N189 Chronic kidney disease, unspecified: Secondary | ICD-10-CM | POA: Diagnosis not present

## 2020-07-10 DIAGNOSIS — K219 Gastro-esophageal reflux disease without esophagitis: Secondary | ICD-10-CM | POA: Diagnosis not present

## 2020-07-10 DIAGNOSIS — I2510 Atherosclerotic heart disease of native coronary artery without angina pectoris: Secondary | ICD-10-CM | POA: Diagnosis not present

## 2020-07-11 ENCOUNTER — Encounter: Payer: Self-pay | Admitting: Gastroenterology

## 2020-07-11 ENCOUNTER — Other Ambulatory Visit: Payer: Self-pay

## 2020-07-11 ENCOUNTER — Ambulatory Visit (INDEPENDENT_AMBULATORY_CARE_PROVIDER_SITE_OTHER): Payer: Medicare HMO | Admitting: Gastroenterology

## 2020-07-11 DIAGNOSIS — K219 Gastro-esophageal reflux disease without esophagitis: Secondary | ICD-10-CM | POA: Diagnosis not present

## 2020-07-11 NOTE — Patient Instructions (Signed)
I recommend taking Protonix once daily, 30 minutes before breakfast.   We will see you back in 6-8 months!  Call us with nausea, vomiting, difficulty swallowing, or diarrhea.  Have a wonderful holiday season!  I enjoyed talking with you again today! As you know, I value our relationship and want to provide genuine, compassionate, and quality care. I welcome your feedback. If you receive a survey regarding your visit,  I greatly appreciate you taking time to fill this out. See you next time!  Annitta Needs, PhD, ANP-BC [facility]

## 2020-07-11 NOTE — Progress Notes (Signed)
Primary Care Physician:  Glenda Chroman, MD  Primary GI: Dr. Abbey Chatters   Patient Location: Home   Provider Location: [facility] office   Reason for Visit: Follow-up   Persons present on the virtual encounter, with roles: Patient and NP   Total time (minutes) spent on medical discussion: 15 minutes   Due to COVID-19, visit was conducted using virtual method.  Visit was requested by patient.  Virtual Visit via Telephone Note Due to COVID-19, visit is conducted virtually and was requested by patient.   I connected with Ashlee Martin on 07/17/20 at  3:00 PM EST by telephone and verified that I am speaking with the correct person using two identifiers.   I discussed the limitations, risks, security and privacy concerns of performing an evaluation and management service by telephone and the availability of in person appointments. I also discussed with the patient that there may be a patient responsible charge related to this service. The patient expressed understanding and agreed to proceed.  Chief Complaint  Patient presents with   Emesis    x3 Sunday; recent admission at [facility] (said no one has came out to help her with new meds so she hasn't started them)     History of Present Illness: 86 year old female with a history of chronic N/V, dysphagia, intermittent diarrhea while on OFEV. GES normal. Outside imaging Sept 2020 CT without contrast with thickening of stomach fundus at GE junction. EGD with Propofol undertaken Oct 2020 with mild gastritis, findings on CT likely due to sliding hiatal hernia, benign-appearing esophageal stricture due to GERD. Colonoscopy normal TI, multiple small and large-mouthed diverticula in recto-sgimoid, sigmoid, transverse. Chronic macrocytic anemia.  Here for routine follow-up via phone. Recently inpatient at outside hospital with concern for NSTEMI, initially concern for PE but negative VQ scan.   Difficulty sleeping at night. Not taking Protonix.  Forgets to take. No diarrhea. Weight 142 at her baseline.   Past Medical History:  Diagnosis Date   Arthritis    Coronary artery calcification seen on CT scan    Depression    Essential hypertension    GERD (gastroesophageal reflux disease)    History of seizures    Hypercholesterolemia    ILD (interstitial lung disease) ([place])    Psoriasis    Had been on Humira but continued to have UTIs, sinus infections, etc. Taken off.     Past Surgical History:  Procedure Laterality Date   BIOPSY  04/20/2018   Procedure: BIOPSY;  Surgeon: Danie Binder, MD;  [facility];  Service: Endoscopy;;  gastric   COLONOSCOPY  01/2011   Dr. Anthony Sar: normal.    COLONOSCOPY WITH PROPOFOL N/A 05/31/2019   normal TI, multiple small and large-mouthed diverticula in recto-sgimoid, sigmoid, transverse   DILATION AND CURETTAGE OF UTERUS     x3   ESOPHAGOGASTRODUODENOSCOPY (EGD) WITH PROPOFOL N/A 04/20/2018   benign-appearing, intrinsic mild stenosis, s/p dilation. Mild inflammation entire stomach, s/p biopsy that was negative for H.pylori. Pylorus normal but had changes consistent with PRIOR ulcer disease. Duodenum normal.    ESOPHAGOGASTRODUODENOSCOPY (EGD) WITH PROPOFOL N/A 05/31/2019   mild gastirits, findings on CT likely due to sliding hiatal hernia, benign-appearing esophageal stricture due to GERD.   SAVORY DILATION N/A 04/20/2018   Procedure: SAVORY DILATION;  Surgeon: Danie Binder, MD;  Location: [facility];  Service: Endoscopy;  Laterality: N/A;   VIDEO BRONCHOSCOPY Bilateral 01/07/2018   Procedure: VIDEO BRONCHOSCOPY WITHOUT FLUORO;  Surgeon:  Brand Males, MD;  Location: [facility];  Service: Cardiopulmonary;  Laterality: Bilateral;   WRIST SURGERY Right    ORIF     Current Meds  Medication Sig   aspirin 325 MG tablet Take 325 mg by mouth daily.   atenolol (TENORMIN) 25 MG tablet Take 25 mg by mouth 2 (two) times daily.    Cholecalciferol (VITAMIN D-3)  125 MCG (5000 UT) TABS Take by mouth daily.   diphenhydrAMINE HCl, Sleep, (SLEEP AID, DIPHENHYDRAMINE, PO) Take 50 mg by mouth as needed.   fexofenadine (ALLEGRA) 180 MG tablet Take 180 mg by mouth as needed.    loratadine (CLARITIN) 10 MG tablet Take 10 mg by mouth at bedtime.   mirtazapine (REMERON) 7.5 MG tablet Take 7.5 mg by mouth at bedtime.   Naproxen Sod-diphenhydrAMINE (ALEVE PM PO) Take by mouth as needed.   Phenylephrine-APAP-guaiFENesin (MUCINEX SINUS-MAX CONGESTION PO) Take by mouth daily.   simvastatin (ZOCOR) 20 MG tablet Take 20 mg by mouth at bedtime.    sodium chloride HYPERTONIC 3 % nebulizer solution Take by nebulization 2 (two) times daily.   vitamin B-12 (CYANOCOBALAMIN) 500 MCG tablet Take 500 mcg by mouth daily.     Family History  Problem Relation Age of Onset   Heart disease Mother    Heart disease Father    Cancer Sister        GYN   Colon cancer Neg Hx     Social History   Socioeconomic History   Marital status: Married    Spouse name: Not on file   Number of children: Not on file   Years of education: Not on file   Highest education level: Not on file  Occupational History   Occupation: Retired  Tobacco Use   Smoking status: Former Smoker    Packs/day: 3.00    Years: 20.00    Pack years: 60.00    Types: Cigarettes    Quit date: 11/03/1972    Years since quitting: 47.7   Smokeless tobacco: Never Used  Vaping Use   Vaping Use: Never used  Substance and Sexual Activity   Alcohol use: Not Currently    Alcohol/week: 1.0 - 2.0 standard drink    Types: 1 - 2 Shots of liquor per week    Comment: May have before bed   Drug use: Never   Sexual activity: Yes    Birth control/protection: None  Other Topics Concern   Not on file  Social History Narrative   Not on file   Social Determinants of Health   Financial Resource Strain:    Difficulty of Paying Living Expenses: Not on file  Food Insecurity:    Worried About  Charity fundraiser in the Last Year: Not on file   [employer] of Food in the Last Year: Not on file  Transportation Needs:    Lack of Transportation (Medical): Not on file   Lack of Transportation (Non-Medical): Not on file  Physical Activity:    Days of Exercise per Week: Not on file   Minutes of Exercise per Session: Not on file  Stress:    Feeling of Stress : Not on file  Social Connections:    Frequency of Communication with Friends and Family: Not on file   Frequency of Social Gatherings with Friends and Family: Not on file   Attends Religious Services: Not on file   Active Member of Clubs or Organizations: Not on file   Attends Archivist Meetings: Not on  file   Marital Status: Not on file       Review of Systems: Gen: Denies fever, chills, anorexia. Denies fatigue, weakness, weight loss.  CV: Denies chest pain, palpitations, syncope, peripheral edema, and claudication. Resp: Denies dyspnea at rest, cough, wheezing, coughing up blood, and pleurisy. GI: see HPI Derm: Denies rash, itching, dry skin Psych: Denies depression, anxiety, memory loss, confusion. No homicidal or suicidal ideation.  Heme: Denies bruising, bleeding, and enlarged lymph nodes.  Observations/Objective: No distress. Unable to perform physical exam due to telephone encounter. No video available.   Assessment and Plan: 86 year old female with chronic GERD, history of diarrhea now resolved off OFEV, presenting for routine follow-up. From a GI perspective, she is doing well and stable. We will see her back in 6-8 months. I have asked that she continue Protonix once daily in setting of need for high dose aspirin.   Follow Up Instructions:    I discussed the assessment and treatment plan with the patient. The patient was provided an opportunity to ask questions and all were answered. The patient agreed with the plan and demonstrated an understanding of the instructions.   The patient was  advised to call back or seek an in-person evaluation if the symptoms worsen or if the condition fails to improve as anticipated.  I provided 15 minutes of non-face-to-face time during this encounter.  Annitta Needs, PhD, ANP-BC [facility]

## 2020-07-12 ENCOUNTER — Telehealth: Payer: Self-pay | Admitting: *Deleted

## 2020-07-12 ENCOUNTER — Encounter: Payer: Self-pay | Admitting: Gastroenterology

## 2020-07-12 DIAGNOSIS — N189 Chronic kidney disease, unspecified: Secondary | ICD-10-CM | POA: Diagnosis not present

## 2020-07-12 DIAGNOSIS — J84112 Idiopathic pulmonary fibrosis: Secondary | ICD-10-CM | POA: Diagnosis not present

## 2020-07-12 DIAGNOSIS — I214 Non-ST elevation (NSTEMI) myocardial infarction: Secondary | ICD-10-CM | POA: Diagnosis not present

## 2020-07-12 DIAGNOSIS — I129 Hypertensive chronic kidney disease with stage 1 through stage 4 chronic kidney disease, or unspecified chronic kidney disease: Secondary | ICD-10-CM | POA: Diagnosis not present

## 2020-07-12 DIAGNOSIS — G40909 Epilepsy, unspecified, not intractable, without status epilepticus: Secondary | ICD-10-CM | POA: Diagnosis not present

## 2020-07-12 DIAGNOSIS — J9611 Chronic respiratory failure with hypoxia: Secondary | ICD-10-CM | POA: Diagnosis not present

## 2020-07-12 DIAGNOSIS — I2510 Atherosclerotic heart disease of native coronary artery without angina pectoris: Secondary | ICD-10-CM | POA: Diagnosis not present

## 2020-07-12 DIAGNOSIS — I2699 Other pulmonary embolism without acute cor pulmonale: Secondary | ICD-10-CM | POA: Diagnosis not present

## 2020-07-12 DIAGNOSIS — K219 Gastro-esophageal reflux disease without esophagitis: Secondary | ICD-10-CM | POA: Diagnosis not present

## 2020-07-12 NOTE — Telephone Encounter (Signed)
Pt consented to a telephone visit on 07/11/2020.

## 2020-07-17 DIAGNOSIS — G40909 Epilepsy, unspecified, not intractable, without status epilepticus: Secondary | ICD-10-CM | POA: Diagnosis not present

## 2020-07-17 DIAGNOSIS — I2510 Atherosclerotic heart disease of native coronary artery without angina pectoris: Secondary | ICD-10-CM | POA: Diagnosis not present

## 2020-07-17 DIAGNOSIS — I2699 Other pulmonary embolism without acute cor pulmonale: Secondary | ICD-10-CM | POA: Diagnosis not present

## 2020-07-17 DIAGNOSIS — J8410 Pulmonary fibrosis, unspecified: Secondary | ICD-10-CM | POA: Diagnosis not present

## 2020-07-17 DIAGNOSIS — J84112 Idiopathic pulmonary fibrosis: Secondary | ICD-10-CM | POA: Diagnosis not present

## 2020-07-17 DIAGNOSIS — J9611 Chronic respiratory failure with hypoxia: Secondary | ICD-10-CM | POA: Diagnosis not present

## 2020-07-17 DIAGNOSIS — I214 Non-ST elevation (NSTEMI) myocardial infarction: Secondary | ICD-10-CM | POA: Diagnosis not present

## 2020-07-17 DIAGNOSIS — J449 Chronic obstructive pulmonary disease, unspecified: Secondary | ICD-10-CM | POA: Diagnosis not present

## 2020-07-17 DIAGNOSIS — I129 Hypertensive chronic kidney disease with stage 1 through stage 4 chronic kidney disease, or unspecified chronic kidney disease: Secondary | ICD-10-CM | POA: Diagnosis not present

## 2020-07-17 DIAGNOSIS — Z6823 Body mass index (BMI) 23.0-23.9, adult: Secondary | ICD-10-CM | POA: Diagnosis not present

## 2020-07-17 DIAGNOSIS — Z9981 Dependence on supplemental oxygen: Secondary | ICD-10-CM | POA: Diagnosis not present

## 2020-07-17 DIAGNOSIS — K219 Gastro-esophageal reflux disease without esophagitis: Secondary | ICD-10-CM | POA: Diagnosis not present

## 2020-07-17 DIAGNOSIS — N189 Chronic kidney disease, unspecified: Secondary | ICD-10-CM | POA: Diagnosis not present

## 2020-07-18 DIAGNOSIS — G40909 Epilepsy, unspecified, not intractable, without status epilepticus: Secondary | ICD-10-CM | POA: Diagnosis not present

## 2020-07-18 DIAGNOSIS — J84112 Idiopathic pulmonary fibrosis: Secondary | ICD-10-CM | POA: Diagnosis not present

## 2020-07-18 DIAGNOSIS — I2699 Other pulmonary embolism without acute cor pulmonale: Secondary | ICD-10-CM | POA: Diagnosis not present

## 2020-07-18 DIAGNOSIS — N189 Chronic kidney disease, unspecified: Secondary | ICD-10-CM | POA: Diagnosis not present

## 2020-07-18 DIAGNOSIS — I129 Hypertensive chronic kidney disease with stage 1 through stage 4 chronic kidney disease, or unspecified chronic kidney disease: Secondary | ICD-10-CM | POA: Diagnosis not present

## 2020-07-18 DIAGNOSIS — K219 Gastro-esophageal reflux disease without esophagitis: Secondary | ICD-10-CM | POA: Diagnosis not present

## 2020-07-18 DIAGNOSIS — I2510 Atherosclerotic heart disease of native coronary artery without angina pectoris: Secondary | ICD-10-CM | POA: Diagnosis not present

## 2020-07-18 DIAGNOSIS — I214 Non-ST elevation (NSTEMI) myocardial infarction: Secondary | ICD-10-CM | POA: Diagnosis not present

## 2020-07-18 DIAGNOSIS — J9611 Chronic respiratory failure with hypoxia: Secondary | ICD-10-CM | POA: Diagnosis not present

## 2020-07-24 DIAGNOSIS — I10 Essential (primary) hypertension: Secondary | ICD-10-CM | POA: Diagnosis not present

## 2020-07-24 DIAGNOSIS — J9611 Chronic respiratory failure with hypoxia: Secondary | ICD-10-CM | POA: Diagnosis not present

## 2020-07-24 DIAGNOSIS — Z515 Encounter for palliative care: Secondary | ICD-10-CM | POA: Diagnosis not present

## 2020-07-24 DIAGNOSIS — E785 Hyperlipidemia, unspecified: Secondary | ICD-10-CM | POA: Diagnosis not present

## 2020-07-26 DIAGNOSIS — J9611 Chronic respiratory failure with hypoxia: Secondary | ICD-10-CM | POA: Diagnosis not present

## 2020-07-26 DIAGNOSIS — J84112 Idiopathic pulmonary fibrosis: Secondary | ICD-10-CM | POA: Diagnosis not present

## 2020-07-26 DIAGNOSIS — I214 Non-ST elevation (NSTEMI) myocardial infarction: Secondary | ICD-10-CM | POA: Diagnosis not present

## 2020-07-26 DIAGNOSIS — G40909 Epilepsy, unspecified, not intractable, without status epilepticus: Secondary | ICD-10-CM | POA: Diagnosis not present

## 2020-07-26 DIAGNOSIS — I129 Hypertensive chronic kidney disease with stage 1 through stage 4 chronic kidney disease, or unspecified chronic kidney disease: Secondary | ICD-10-CM | POA: Diagnosis not present

## 2020-07-26 DIAGNOSIS — K219 Gastro-esophageal reflux disease without esophagitis: Secondary | ICD-10-CM | POA: Diagnosis not present

## 2020-07-26 DIAGNOSIS — I2510 Atherosclerotic heart disease of native coronary artery without angina pectoris: Secondary | ICD-10-CM | POA: Diagnosis not present

## 2020-07-26 DIAGNOSIS — N189 Chronic kidney disease, unspecified: Secondary | ICD-10-CM | POA: Diagnosis not present

## 2020-07-26 DIAGNOSIS — I2699 Other pulmonary embolism without acute cor pulmonale: Secondary | ICD-10-CM | POA: Diagnosis not present

## 2020-07-31 DIAGNOSIS — J9611 Chronic respiratory failure with hypoxia: Secondary | ICD-10-CM | POA: Diagnosis not present

## 2020-07-31 DIAGNOSIS — N189 Chronic kidney disease, unspecified: Secondary | ICD-10-CM | POA: Diagnosis not present

## 2020-07-31 DIAGNOSIS — I129 Hypertensive chronic kidney disease with stage 1 through stage 4 chronic kidney disease, or unspecified chronic kidney disease: Secondary | ICD-10-CM | POA: Diagnosis not present

## 2020-07-31 DIAGNOSIS — I214 Non-ST elevation (NSTEMI) myocardial infarction: Secondary | ICD-10-CM | POA: Diagnosis not present

## 2020-07-31 DIAGNOSIS — I2510 Atherosclerotic heart disease of native coronary artery without angina pectoris: Secondary | ICD-10-CM | POA: Diagnosis not present

## 2020-07-31 DIAGNOSIS — I2699 Other pulmonary embolism without acute cor pulmonale: Secondary | ICD-10-CM | POA: Diagnosis not present

## 2020-07-31 DIAGNOSIS — J84112 Idiopathic pulmonary fibrosis: Secondary | ICD-10-CM | POA: Diagnosis not present

## 2020-07-31 DIAGNOSIS — G40909 Epilepsy, unspecified, not intractable, without status epilepticus: Secondary | ICD-10-CM | POA: Diagnosis not present

## 2020-07-31 DIAGNOSIS — K219 Gastro-esophageal reflux disease without esophagitis: Secondary | ICD-10-CM | POA: Diagnosis not present

## 2020-08-01 DIAGNOSIS — I2510 Atherosclerotic heart disease of native coronary artery without angina pectoris: Secondary | ICD-10-CM | POA: Diagnosis not present

## 2020-08-01 DIAGNOSIS — I214 Non-ST elevation (NSTEMI) myocardial infarction: Secondary | ICD-10-CM | POA: Diagnosis not present

## 2020-08-01 DIAGNOSIS — N189 Chronic kidney disease, unspecified: Secondary | ICD-10-CM | POA: Diagnosis not present

## 2020-08-01 DIAGNOSIS — J9611 Chronic respiratory failure with hypoxia: Secondary | ICD-10-CM | POA: Diagnosis not present

## 2020-08-01 DIAGNOSIS — K219 Gastro-esophageal reflux disease without esophagitis: Secondary | ICD-10-CM | POA: Diagnosis not present

## 2020-08-01 DIAGNOSIS — I129 Hypertensive chronic kidney disease with stage 1 through stage 4 chronic kidney disease, or unspecified chronic kidney disease: Secondary | ICD-10-CM | POA: Diagnosis not present

## 2020-08-01 DIAGNOSIS — I2699 Other pulmonary embolism without acute cor pulmonale: Secondary | ICD-10-CM | POA: Diagnosis not present

## 2020-08-01 DIAGNOSIS — J84112 Idiopathic pulmonary fibrosis: Secondary | ICD-10-CM | POA: Diagnosis not present

## 2020-08-01 DIAGNOSIS — G40909 Epilepsy, unspecified, not intractable, without status epilepticus: Secondary | ICD-10-CM | POA: Diagnosis not present

## 2020-08-02 DIAGNOSIS — J9611 Chronic respiratory failure with hypoxia: Secondary | ICD-10-CM | POA: Diagnosis not present

## 2020-08-02 DIAGNOSIS — G40909 Epilepsy, unspecified, not intractable, without status epilepticus: Secondary | ICD-10-CM | POA: Diagnosis not present

## 2020-08-02 DIAGNOSIS — I2699 Other pulmonary embolism without acute cor pulmonale: Secondary | ICD-10-CM | POA: Diagnosis not present

## 2020-08-02 DIAGNOSIS — J84112 Idiopathic pulmonary fibrosis: Secondary | ICD-10-CM | POA: Diagnosis not present

## 2020-08-02 IMAGING — US ULTRASOUND ABDOMEN LIMITED
1 series · 14 of 25 positions shown · non-contrast
Comparison: 04/22/2017

CLINICAL DATA: History of fatty liver

EXAM:
ULTRASOUND ABDOMEN LIMITED RIGHT UPPER QUADRANT

[Series 1: ultrasound abdomen limited · 0.19mm/px · 14 of 43 slices shown]
[im 1/43]
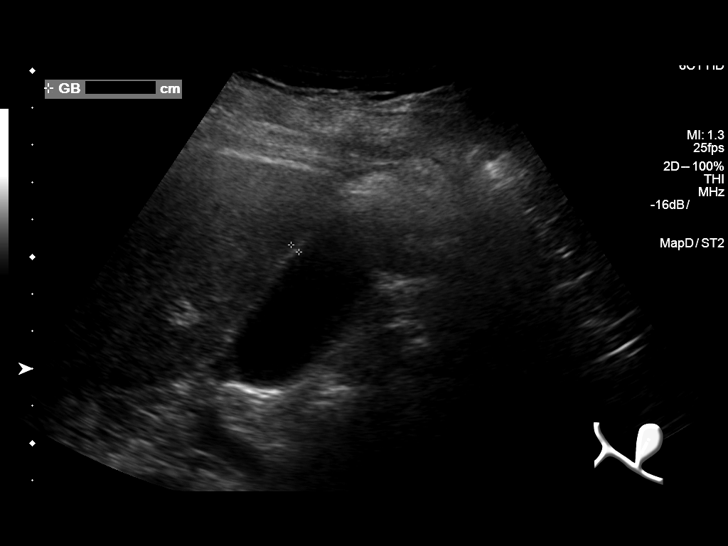
[im 4/43]
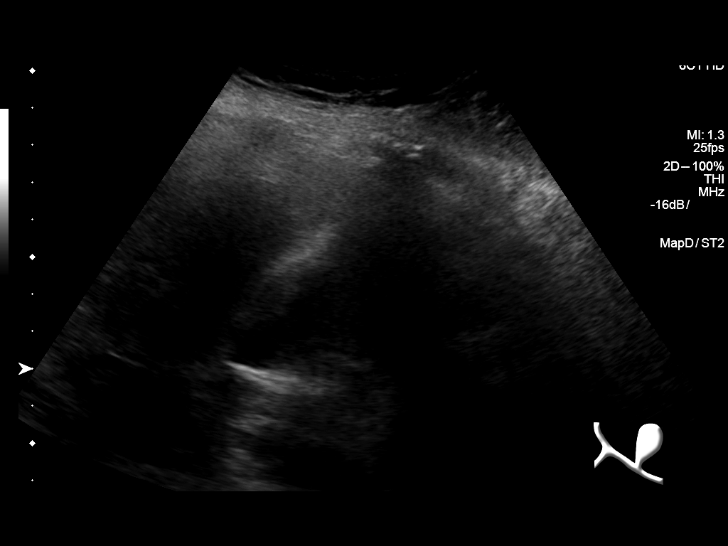
[im 8/43]
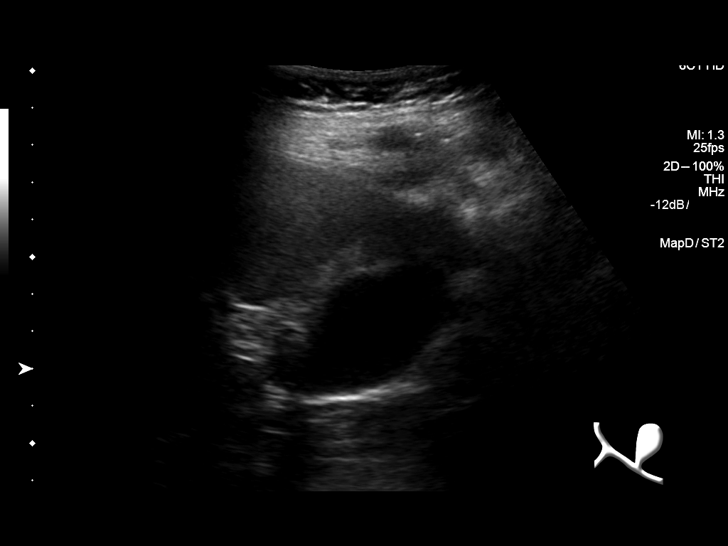
[im 11/43]
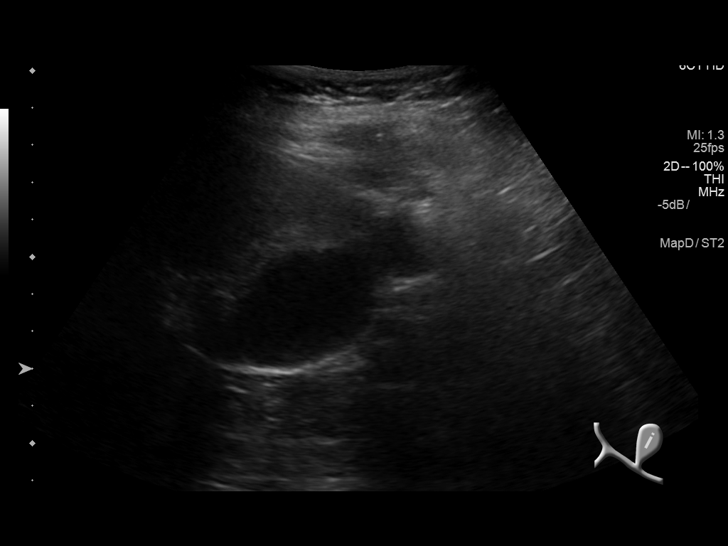
[im 15/43]
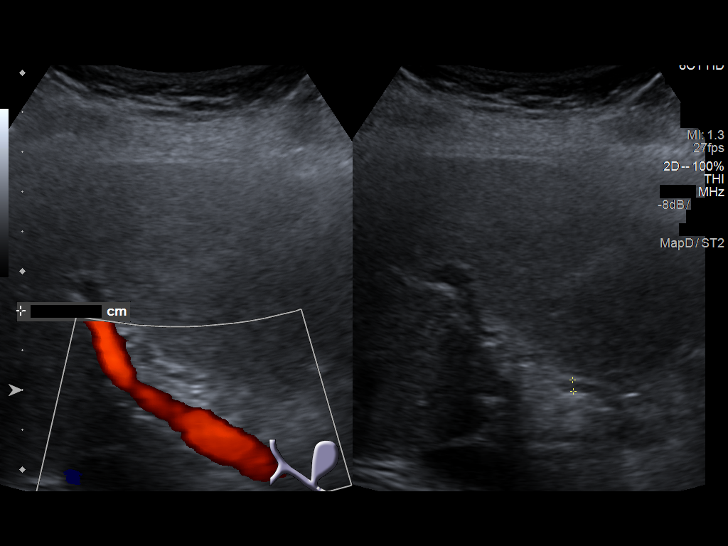
[im 16/43]
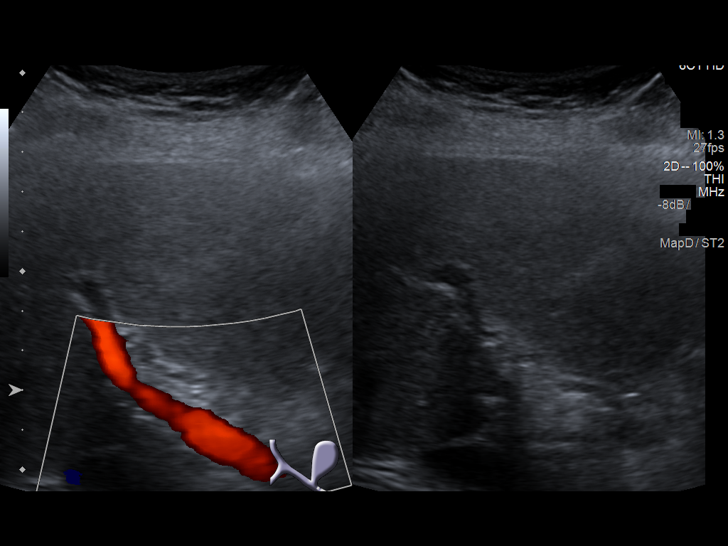
[im 20/43]
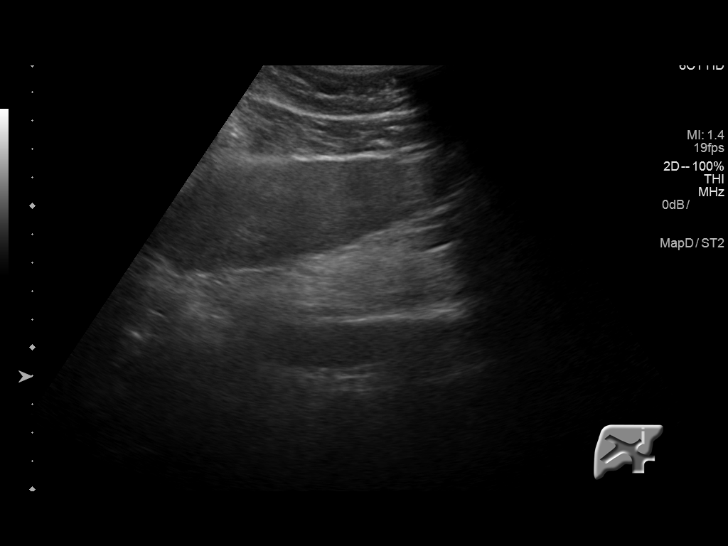
[im 23/43]
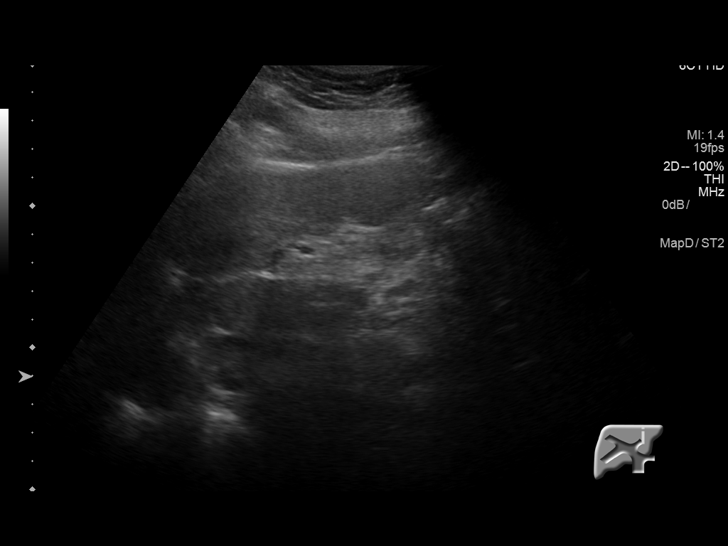
[im 27/43]
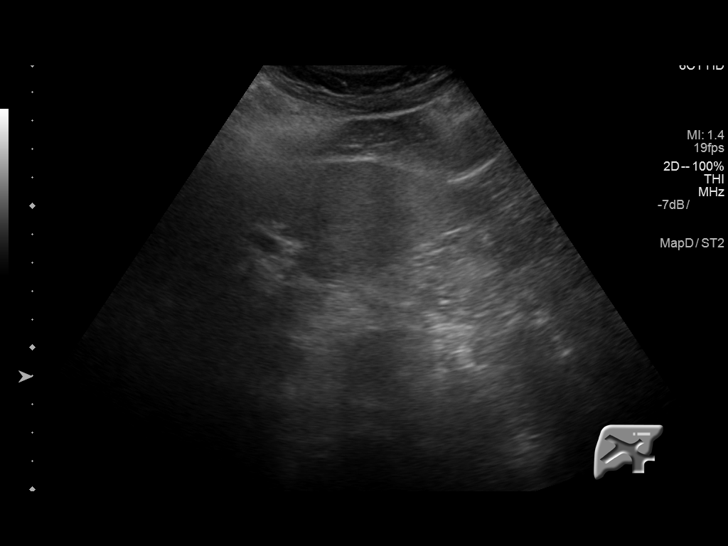
[im 29/43]
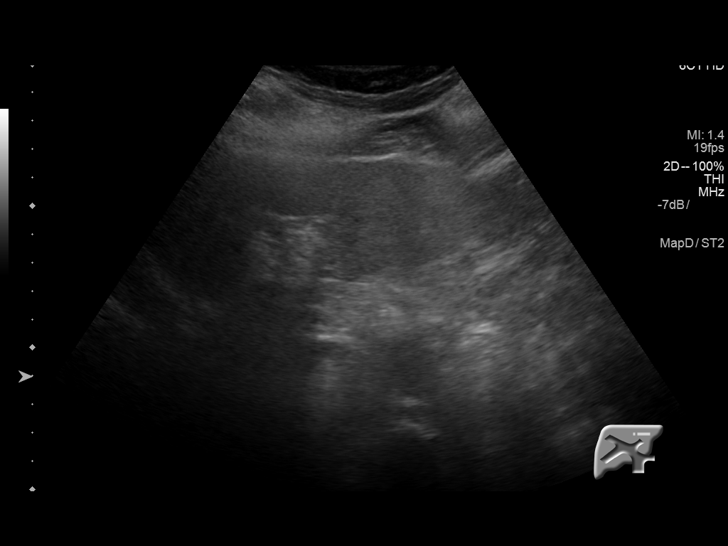
[im 32/43]
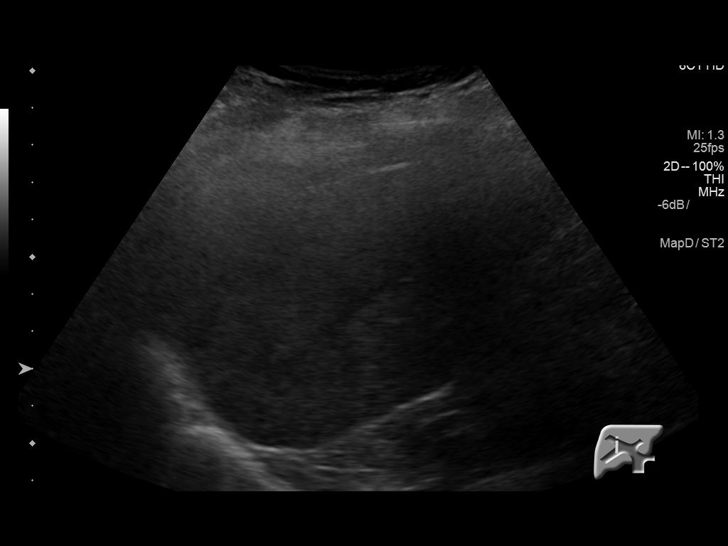
[im 36/43]
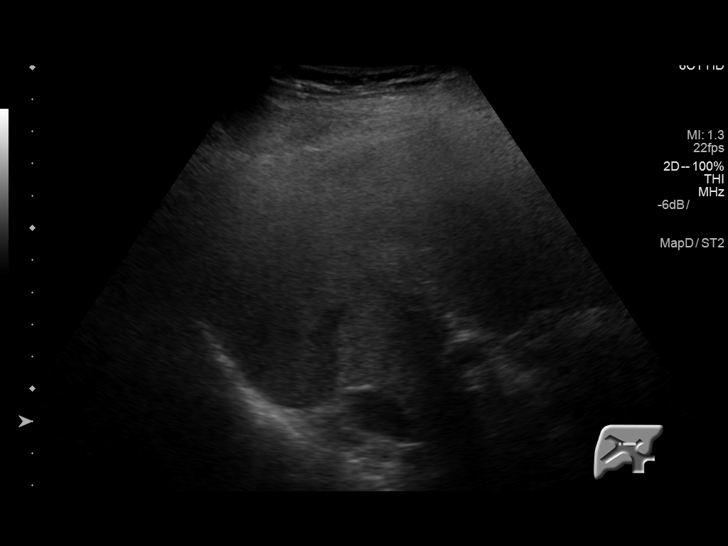
[im 39/43]
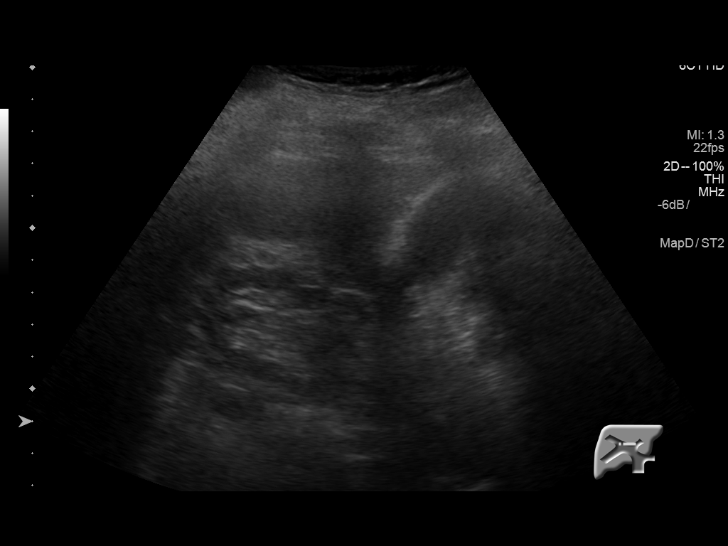
[im 43/43]
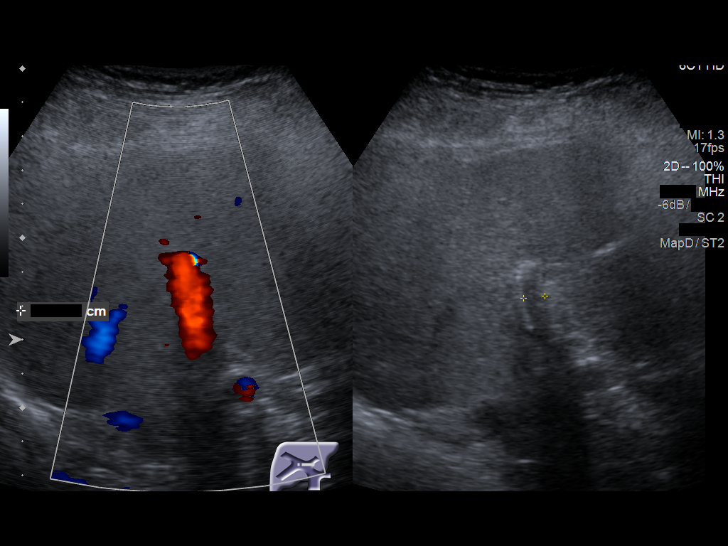

[14 of 25 positions shown; findings below may reference images not displayed]

FINDINGS: Gallbladder:

No gallstones or wall thickening visualized. No sonographic Murphy
sign noted by sonographer.

Common bile duct:

Diameter: 3 mm

Liver:

Increased in echogenicity consistent with fatty infiltration. The
area of decreased echogenicity seen previously is not well
appreciated on this exam. Portal vein is patent on color Doppler
imaging with normal direction of blood flow towards the liver.
IMPRESSION: Fatty liver.

The area is suspicious for focal fatty sparing is not well
appreciated on today's exam. No new focal abnormality is noted.

## 2020-08-04 DIAGNOSIS — J9611 Chronic respiratory failure with hypoxia: Secondary | ICD-10-CM | POA: Diagnosis not present

## 2020-08-04 DIAGNOSIS — K219 Gastro-esophageal reflux disease without esophagitis: Secondary | ICD-10-CM | POA: Diagnosis not present

## 2020-08-04 DIAGNOSIS — J84112 Idiopathic pulmonary fibrosis: Secondary | ICD-10-CM | POA: Diagnosis not present

## 2020-08-04 DIAGNOSIS — M1990 Unspecified osteoarthritis, unspecified site: Secondary | ICD-10-CM | POA: Diagnosis not present

## 2020-08-04 DIAGNOSIS — I2510 Atherosclerotic heart disease of native coronary artery without angina pectoris: Secondary | ICD-10-CM | POA: Diagnosis not present

## 2020-08-04 DIAGNOSIS — I2699 Other pulmonary embolism without acute cor pulmonale: Secondary | ICD-10-CM | POA: Diagnosis not present

## 2020-08-04 DIAGNOSIS — I129 Hypertensive chronic kidney disease with stage 1 through stage 4 chronic kidney disease, or unspecified chronic kidney disease: Secondary | ICD-10-CM | POA: Diagnosis not present

## 2020-08-04 DIAGNOSIS — G40909 Epilepsy, unspecified, not intractable, without status epilepticus: Secondary | ICD-10-CM | POA: Diagnosis not present

## 2020-08-04 DIAGNOSIS — N189 Chronic kidney disease, unspecified: Secondary | ICD-10-CM | POA: Diagnosis not present

## 2020-08-06 DIAGNOSIS — R0902 Hypoxemia: Secondary | ICD-10-CM | POA: Diagnosis not present

## 2020-08-06 DIAGNOSIS — M1611 Unilateral primary osteoarthritis, right hip: Secondary | ICD-10-CM | POA: Diagnosis not present

## 2020-08-06 DIAGNOSIS — G40909 Epilepsy, unspecified, not intractable, without status epilepticus: Secondary | ICD-10-CM | POA: Diagnosis not present

## 2020-08-06 DIAGNOSIS — J84112 Idiopathic pulmonary fibrosis: Secondary | ICD-10-CM | POA: Diagnosis not present

## 2020-08-06 DIAGNOSIS — J849 Interstitial pulmonary disease, unspecified: Secondary | ICD-10-CM | POA: Diagnosis not present

## 2020-08-06 DIAGNOSIS — M1990 Unspecified osteoarthritis, unspecified site: Secondary | ICD-10-CM | POA: Diagnosis not present

## 2020-08-06 DIAGNOSIS — Z96649 Presence of unspecified artificial hip joint: Secondary | ICD-10-CM | POA: Diagnosis not present

## 2020-08-06 DIAGNOSIS — J9611 Chronic respiratory failure with hypoxia: Secondary | ICD-10-CM | POA: Diagnosis not present

## 2020-08-06 DIAGNOSIS — I129 Hypertensive chronic kidney disease with stage 1 through stage 4 chronic kidney disease, or unspecified chronic kidney disease: Secondary | ICD-10-CM | POA: Diagnosis not present

## 2020-08-06 DIAGNOSIS — I2510 Atherosclerotic heart disease of native coronary artery without angina pectoris: Secondary | ICD-10-CM | POA: Diagnosis not present

## 2020-08-06 DIAGNOSIS — K219 Gastro-esophageal reflux disease without esophagitis: Secondary | ICD-10-CM | POA: Diagnosis not present

## 2020-08-06 DIAGNOSIS — I2699 Other pulmonary embolism without acute cor pulmonale: Secondary | ICD-10-CM | POA: Diagnosis not present

## 2020-08-06 DIAGNOSIS — N189 Chronic kidney disease, unspecified: Secondary | ICD-10-CM | POA: Diagnosis not present

## 2020-08-08 DIAGNOSIS — J9611 Chronic respiratory failure with hypoxia: Secondary | ICD-10-CM | POA: Diagnosis not present

## 2020-08-08 DIAGNOSIS — N189 Chronic kidney disease, unspecified: Secondary | ICD-10-CM | POA: Diagnosis not present

## 2020-08-08 DIAGNOSIS — I2699 Other pulmonary embolism without acute cor pulmonale: Secondary | ICD-10-CM | POA: Diagnosis not present

## 2020-08-08 DIAGNOSIS — J84112 Idiopathic pulmonary fibrosis: Secondary | ICD-10-CM | POA: Diagnosis not present

## 2020-08-08 DIAGNOSIS — I129 Hypertensive chronic kidney disease with stage 1 through stage 4 chronic kidney disease, or unspecified chronic kidney disease: Secondary | ICD-10-CM | POA: Diagnosis not present

## 2020-08-08 DIAGNOSIS — K219 Gastro-esophageal reflux disease without esophagitis: Secondary | ICD-10-CM | POA: Diagnosis not present

## 2020-08-08 DIAGNOSIS — I2510 Atherosclerotic heart disease of native coronary artery without angina pectoris: Secondary | ICD-10-CM | POA: Diagnosis not present

## 2020-08-08 DIAGNOSIS — M1990 Unspecified osteoarthritis, unspecified site: Secondary | ICD-10-CM | POA: Diagnosis not present

## 2020-08-08 DIAGNOSIS — G40909 Epilepsy, unspecified, not intractable, without status epilepticus: Secondary | ICD-10-CM | POA: Diagnosis not present

## 2020-08-09 DIAGNOSIS — I129 Hypertensive chronic kidney disease with stage 1 through stage 4 chronic kidney disease, or unspecified chronic kidney disease: Secondary | ICD-10-CM | POA: Diagnosis not present

## 2020-08-09 DIAGNOSIS — I2510 Atherosclerotic heart disease of native coronary artery without angina pectoris: Secondary | ICD-10-CM | POA: Diagnosis not present

## 2020-08-09 DIAGNOSIS — G40909 Epilepsy, unspecified, not intractable, without status epilepticus: Secondary | ICD-10-CM | POA: Diagnosis not present

## 2020-08-09 DIAGNOSIS — J84112 Idiopathic pulmonary fibrosis: Secondary | ICD-10-CM | POA: Diagnosis not present

## 2020-08-09 DIAGNOSIS — K219 Gastro-esophageal reflux disease without esophagitis: Secondary | ICD-10-CM | POA: Diagnosis not present

## 2020-08-09 DIAGNOSIS — J9611 Chronic respiratory failure with hypoxia: Secondary | ICD-10-CM | POA: Diagnosis not present

## 2020-08-09 DIAGNOSIS — I2699 Other pulmonary embolism without acute cor pulmonale: Secondary | ICD-10-CM | POA: Diagnosis not present

## 2020-08-09 DIAGNOSIS — M1990 Unspecified osteoarthritis, unspecified site: Secondary | ICD-10-CM | POA: Diagnosis not present

## 2020-08-09 DIAGNOSIS — N189 Chronic kidney disease, unspecified: Secondary | ICD-10-CM | POA: Diagnosis not present

## 2020-08-13 ENCOUNTER — Telehealth: Payer: Self-pay | Admitting: Internal Medicine

## 2020-08-13 DIAGNOSIS — M1990 Unspecified osteoarthritis, unspecified site: Secondary | ICD-10-CM | POA: Diagnosis not present

## 2020-08-13 DIAGNOSIS — K219 Gastro-esophageal reflux disease without esophagitis: Secondary | ICD-10-CM | POA: Diagnosis not present

## 2020-08-13 DIAGNOSIS — J84112 Idiopathic pulmonary fibrosis: Secondary | ICD-10-CM | POA: Diagnosis not present

## 2020-08-13 DIAGNOSIS — G40909 Epilepsy, unspecified, not intractable, without status epilepticus: Secondary | ICD-10-CM | POA: Diagnosis not present

## 2020-08-13 DIAGNOSIS — I129 Hypertensive chronic kidney disease with stage 1 through stage 4 chronic kidney disease, or unspecified chronic kidney disease: Secondary | ICD-10-CM | POA: Diagnosis not present

## 2020-08-13 DIAGNOSIS — N189 Chronic kidney disease, unspecified: Secondary | ICD-10-CM | POA: Diagnosis not present

## 2020-08-13 DIAGNOSIS — I2510 Atherosclerotic heart disease of native coronary artery without angina pectoris: Secondary | ICD-10-CM | POA: Diagnosis not present

## 2020-08-13 DIAGNOSIS — J9611 Chronic respiratory failure with hypoxia: Secondary | ICD-10-CM | POA: Diagnosis not present

## 2020-08-13 DIAGNOSIS — I2699 Other pulmonary embolism without acute cor pulmonale: Secondary | ICD-10-CM | POA: Diagnosis not present

## 2020-08-13 MED ORDER — PREDNISONE 10 MG PO TABS: ORAL_TABLET | ORAL | 0 refills | Status: AC

## 2020-08-13 MED ORDER — DOXYCYCLINE HYCLATE 100 MG PO TABS
100.0000 mg | ORAL_TABLET | Freq: Two times a day (BID) | ORAL | 0 refills | Status: DC
Start: 2020-08-13 — End: 2020-11-23

## 2020-08-13 NOTE — Telephone Encounter (Signed)
Tried to call pt but received a busy signal. Tried to call back but again received a busy signal. Unable to reach pt. Will try to call back later.

## 2020-08-13 NOTE — Telephone Encounter (Signed)
Prob acute bronchitis  Plan  - Please take prednisone 40 mg x1 day, then 30 mg x1 day, then 20 mg x1 day, then 10 mg x1 day, and then 5 mg x1 day and stop  Take doxycycline 128m po twice daily x 5 days; take after meals and avoid sunlight  - to be on safe side should go get a covid PCR test   Allergies  Allergen Reactions  . Humira [Adalimumab] Other (See Comments)    Worsened your psoriasis.  . Pirfenidone Other (See Comments)    Fatigue and weight loss  . Ofev [Nintedanib] Rash    Current Outpatient Medications:  .  aspirin 325 MG tablet, Take 325 mg by mouth daily., Disp: , Rfl:  .  atenolol (TENORMIN) 25 MG tablet, Take 25 mg by mouth 2 (two) times daily. , Disp: , Rfl:  .  Cholecalciferol (VITAMIN D-3) 125 MCG (5000 UT) TABS, Take by mouth daily., Disp: , Rfl:  .  diphenhydrAMINE HCl, Sleep, (SLEEP AID, DIPHENHYDRAMINE, PO), Take 50 mg by mouth as needed., Disp: , Rfl:  .  fexofenadine (ALLEGRA) 180 MG tablet, Take 180 mg by mouth as needed. , Disp: , Rfl:  .  loratadine (CLARITIN) 10 MG tablet, Take 10 mg by mouth at bedtime., Disp: , Rfl:  .  mirtazapine (REMERON) 7.5 MG tablet, Take 7.5 mg by mouth at bedtime., Disp: , Rfl:  .  Naproxen Sod-diphenhydrAMINE (ALEVE PM PO), Take by mouth as needed., Disp: , Rfl:  .  pantoprazole (PROTONIX) 40 MG tablet, TAKE 1 TABLET (40 MG TOTAL) BY MOUTH 2 (TWO) TIMES DAILY BEFORE A MEAL. (Patient not taking: Reported on 07/11/2020), Disp: 180 tablet, Rfl: 1 .  Phenylephrine-APAP-guaiFENesin (MUCINEX SINUS-MAX CONGESTION PO), Take by mouth daily., Disp: , Rfl:  .  simvastatin (ZOCOR) 20 MG tablet, Take 20 mg by mouth at bedtime. , Disp: , Rfl:  .  sodium chloride HYPERTONIC 3 % nebulizer solution, Take by nebulization 2 (two) times daily., Disp: 750 mL, Rfl: 3 .  vitamin B-12 (CYANOCOBALAMIN) 500 MCG tablet, Take 500 mcg by mouth daily., Disp: , Rfl:      Immunization History  Administered Date(s) Administered  . Fluad Quad(high Dose  65+) 05/10/2019  . Influenza, High Dose Seasonal PF 06/10/2017, 04/27/2018, 05/10/2020  . Moderna Sars-Covid-2 Vaccination 10/12/2019, 11/11/2019, 12/12/2019  . Pneumococcal Conjugate-13 12/09/2014, 09/05/2018

## 2020-08-13 NOTE — Telephone Encounter (Signed)
Primary Pulmonologist: MR Last office visit and with whom: 05/10/20 with MR What do we see them for (pulmonary problems): IPF, chronic resp fail Last OV assessment/plan:  Assessment:       ICD-10-CM   1. Chronic respiratory failure with hypoxia (HCC)  J96.11   2. IPF (idiopathic pulmonary fibrosis) ([place])  J84.112   3. 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 Physical deconditioning  R53.81        Plan:     Patient Instructions     ICD-10-CM   1. Chronic respiratory failure with hypoxia (HCC)  J96.11   2. IPF (idiopathic pulmonary fibrosis) ([place])  J84.112   3. 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 Physical deconditioning  R53.81    I am concerned that you are having progressive pulmonary fibrosis You are also significantly physically deconditioned I agree with you that long-term prednisone can be harmful for you Noted that you do not tolerate either pirfenidone or nintedanib and you are on supportive care  Plan -Overall continue supportive care -Use oxygen 3 L at rest and titrate as needed when you exert yourself for pulse ox goal greater than 86% -Refer home physical therapy for physical conditioning -I will send a message to Dr. Domenic Polite to get right heart catheterization (recently nebulizer called treprostinil was approved in your situation although insurance has not picked it up yet] - I will have the research team screen you for an upcoming study called BELLEROPHON pulsed nitric oxide device study -this is suitable for patients with advanced lung disease and fibrosis             -Might be a few months before we get started  -I do not recommend plasma treatment or stem cell treatment -Continue home palliative care   Follow-up -30-minute office visit in 3 months             -ILD symptom questionnaire at follow-up  Was appointment offered to patient (explain)?  Pt wants recommendations   Reason for call: Called and spoke with pt who  stated this morning 12/20 when nurse came to house to check vitals, her O2 sats were at 79%. Pt said that she must have taken O2 off in the middle of the night while she was sleeping and then after sats were 79%, O2 was put back on and it took about 15 minutes to get sats back up to 90%. Pt said that she does wear 3L O2 24/7.  Pt said that she has had a mild cough mainly coughing up clear phlegm but said sometimes it will have some green tinge to it. Pt also has had some wheezing and also has had some green postnasal drainage.  Pt denies any complaints of fever (temp this morning was right at 99.0). pt wants to know what could be recommended to help with her symptoms. MR, please advise.    Allergies  Allergen Reactions  . Humira [Adalimumab] Other (See Comments)    Worsened your psoriasis.  . Pirfenidone Other (See Comments)    Fatigue and weight loss  . Ofev [Nintedanib] Rash    Immunization History  Administered Date(s) Administered  . Fluad Quad(high Dose 65+) 05/10/2019  . Influenza, High Dose Seasonal PF 06/10/2017, 04/27/2018, 05/10/2020  . Moderna Sars-Covid-2 Vaccination 10/12/2019, 11/11/2019, 12/12/2019  . Pneumococcal Conjugate-13 12/09/2014, 09/05/2018

## 2020-08-13 NOTE — Telephone Encounter (Signed)
Called and spoke with pt letting her know the info stated by MR and she verbalized understanding. Pt wrote down the covid test that is recommended by MR to make sure she does get the right one done. meds have been sent to pharmacy for pt. Nothing further needed.

## 2020-08-15 ENCOUNTER — Telehealth: Payer: Self-pay | Admitting: Internal Medicine

## 2020-08-15 DIAGNOSIS — Z20822 Contact with and (suspected) exposure to covid-19: Secondary | ICD-10-CM | POA: Diagnosis not present

## 2020-08-15 NOTE — Telephone Encounter (Signed)
Please disregard

## 2020-08-21 ENCOUNTER — Telehealth: Payer: Self-pay | Admitting: Internal Medicine

## 2020-08-21 NOTE — Telephone Encounter (Signed)
Will forward message to MR as an [place]

## 2020-08-22 NOTE — Telephone Encounter (Signed)
Great and glad to know

## 2020-08-30 DIAGNOSIS — I2510 Atherosclerotic heart disease of native coronary artery without angina pectoris: Secondary | ICD-10-CM | POA: Diagnosis not present

## 2020-08-30 DIAGNOSIS — I2699 Other pulmonary embolism without acute cor pulmonale: Secondary | ICD-10-CM | POA: Diagnosis not present

## 2020-08-30 DIAGNOSIS — I129 Hypertensive chronic kidney disease with stage 1 through stage 4 chronic kidney disease, or unspecified chronic kidney disease: Secondary | ICD-10-CM | POA: Diagnosis not present

## 2020-08-30 DIAGNOSIS — G40909 Epilepsy, unspecified, not intractable, without status epilepticus: Secondary | ICD-10-CM | POA: Diagnosis not present

## 2020-08-30 DIAGNOSIS — J84112 Idiopathic pulmonary fibrosis: Secondary | ICD-10-CM | POA: Diagnosis not present

## 2020-08-30 DIAGNOSIS — J9611 Chronic respiratory failure with hypoxia: Secondary | ICD-10-CM | POA: Diagnosis not present

## 2020-08-30 DIAGNOSIS — M1990 Unspecified osteoarthritis, unspecified site: Secondary | ICD-10-CM | POA: Diagnosis not present

## 2020-08-30 DIAGNOSIS — N189 Chronic kidney disease, unspecified: Secondary | ICD-10-CM | POA: Diagnosis not present

## 2020-08-30 DIAGNOSIS — K219 Gastro-esophageal reflux disease without esophagitis: Secondary | ICD-10-CM | POA: Diagnosis not present

## 2020-08-31 ENCOUNTER — Telehealth: Payer: Self-pay | Admitting: Internal Medicine

## 2020-08-31 DIAGNOSIS — I2699 Other pulmonary embolism without acute cor pulmonale: Secondary | ICD-10-CM | POA: Diagnosis not present

## 2020-08-31 DIAGNOSIS — K219 Gastro-esophageal reflux disease without esophagitis: Secondary | ICD-10-CM | POA: Diagnosis not present

## 2020-08-31 DIAGNOSIS — N189 Chronic kidney disease, unspecified: Secondary | ICD-10-CM | POA: Diagnosis not present

## 2020-08-31 DIAGNOSIS — I2510 Atherosclerotic heart disease of native coronary artery without angina pectoris: Secondary | ICD-10-CM | POA: Diagnosis not present

## 2020-08-31 DIAGNOSIS — G40909 Epilepsy, unspecified, not intractable, without status epilepticus: Secondary | ICD-10-CM | POA: Diagnosis not present

## 2020-08-31 DIAGNOSIS — J84112 Idiopathic pulmonary fibrosis: Secondary | ICD-10-CM | POA: Diagnosis not present

## 2020-08-31 DIAGNOSIS — M1990 Unspecified osteoarthritis, unspecified site: Secondary | ICD-10-CM | POA: Diagnosis not present

## 2020-08-31 DIAGNOSIS — I129 Hypertensive chronic kidney disease with stage 1 through stage 4 chronic kidney disease, or unspecified chronic kidney disease: Secondary | ICD-10-CM | POA: Diagnosis not present

## 2020-08-31 DIAGNOSIS — J9611 Chronic respiratory failure with hypoxia: Secondary | ICD-10-CM | POA: Diagnosis not present

## 2020-08-31 NOTE — Telephone Encounter (Signed)
Called spoke with patient.  Let her know we needed an in-person visit due to ILD Patient voiced understanding.  Patient wants to start back with therapy. She said it was physical therapy in [place]. But needed Dr. Chase Caller to start her back. I let her know Dr. Chase Caller was out of the office and we could get her on his schedule since it is out to Feb 22 already and she said okay. But wants to know if she needs to come into office sooner.  MR please advise.

## 2020-09-03 DIAGNOSIS — G40909 Epilepsy, unspecified, not intractable, without status epilepticus: Secondary | ICD-10-CM | POA: Diagnosis not present

## 2020-09-03 DIAGNOSIS — I129 Hypertensive chronic kidney disease with stage 1 through stage 4 chronic kidney disease, or unspecified chronic kidney disease: Secondary | ICD-10-CM | POA: Diagnosis not present

## 2020-09-03 DIAGNOSIS — J9611 Chronic respiratory failure with hypoxia: Secondary | ICD-10-CM | POA: Diagnosis not present

## 2020-09-03 DIAGNOSIS — I2699 Other pulmonary embolism without acute cor pulmonale: Secondary | ICD-10-CM | POA: Diagnosis not present

## 2020-09-03 DIAGNOSIS — K219 Gastro-esophageal reflux disease without esophagitis: Secondary | ICD-10-CM | POA: Diagnosis not present

## 2020-09-03 DIAGNOSIS — I2510 Atherosclerotic heart disease of native coronary artery without angina pectoris: Secondary | ICD-10-CM | POA: Diagnosis not present

## 2020-09-03 DIAGNOSIS — M1990 Unspecified osteoarthritis, unspecified site: Secondary | ICD-10-CM | POA: Diagnosis not present

## 2020-09-03 DIAGNOSIS — N189 Chronic kidney disease, unspecified: Secondary | ICD-10-CM | POA: Diagnosis not present

## 2020-09-03 DIAGNOSIS — J84112 Idiopathic pulmonary fibrosis: Secondary | ICD-10-CM | POA: Diagnosis not present

## 2020-09-03 NOTE — Telephone Encounter (Signed)
Called and spoke with patient to let her know that Dr. Chase Caller was fine with 2/22 in person and at that time would talk about starting therapy. Advised patient of appointment time. She expressed understanding. Nothing further needed at this time.

## 2020-09-03 NOTE — Telephone Encounter (Signed)
Feb 22 in house visit is fine

## 2020-09-05 DIAGNOSIS — Z87891 Personal history of nicotine dependence: Secondary | ICD-10-CM | POA: Diagnosis not present

## 2020-09-05 DIAGNOSIS — N189 Chronic kidney disease, unspecified: Secondary | ICD-10-CM | POA: Diagnosis not present

## 2020-09-05 DIAGNOSIS — J9600 Acute respiratory failure, unspecified whether with hypoxia or hypercapnia: Secondary | ICD-10-CM | POA: Diagnosis not present

## 2020-09-05 DIAGNOSIS — E7800 Pure hypercholesterolemia, unspecified: Secondary | ICD-10-CM | POA: Diagnosis not present

## 2020-09-05 DIAGNOSIS — N390 Urinary tract infection, site not specified: Secondary | ICD-10-CM | POA: Diagnosis not present

## 2020-09-05 DIAGNOSIS — R790 Abnormal level of blood mineral: Secondary | ICD-10-CM | POA: Diagnosis not present

## 2020-09-05 DIAGNOSIS — J8410 Pulmonary fibrosis, unspecified: Secondary | ICD-10-CM | POA: Diagnosis not present

## 2020-09-05 DIAGNOSIS — J9621 Acute and chronic respiratory failure with hypoxia: Secondary | ICD-10-CM | POA: Diagnosis not present

## 2020-09-05 DIAGNOSIS — J84112 Idiopathic pulmonary fibrosis: Secondary | ICD-10-CM | POA: Diagnosis not present

## 2020-09-05 DIAGNOSIS — Z7982 Long term (current) use of aspirin: Secondary | ICD-10-CM | POA: Diagnosis not present

## 2020-09-05 DIAGNOSIS — R0600 Dyspnea, unspecified: Secondary | ICD-10-CM | POA: Diagnosis not present

## 2020-09-05 DIAGNOSIS — Z20822 Contact with and (suspected) exposure to covid-19: Secondary | ICD-10-CM | POA: Diagnosis not present

## 2020-09-05 DIAGNOSIS — J441 Chronic obstructive pulmonary disease with (acute) exacerbation: Secondary | ICD-10-CM | POA: Diagnosis not present

## 2020-09-05 DIAGNOSIS — R262 Difficulty in walking, not elsewhere classified: Secondary | ICD-10-CM | POA: Diagnosis not present

## 2020-09-05 DIAGNOSIS — R7989 Other specified abnormal findings of blood chemistry: Secondary | ICD-10-CM | POA: Diagnosis not present

## 2020-09-06 DIAGNOSIS — J441 Chronic obstructive pulmonary disease with (acute) exacerbation: Secondary | ICD-10-CM | POA: Diagnosis not present

## 2020-09-06 DIAGNOSIS — M1611 Unilateral primary osteoarthritis, right hip: Secondary | ICD-10-CM | POA: Diagnosis not present

## 2020-09-06 DIAGNOSIS — R0902 Hypoxemia: Secondary | ICD-10-CM | POA: Diagnosis not present

## 2020-09-06 DIAGNOSIS — N390 Urinary tract infection, site not specified: Secondary | ICD-10-CM | POA: Diagnosis not present

## 2020-09-06 DIAGNOSIS — J9600 Acute respiratory failure, unspecified whether with hypoxia or hypercapnia: Secondary | ICD-10-CM | POA: Diagnosis not present

## 2020-09-06 DIAGNOSIS — Z96649 Presence of unspecified artificial hip joint: Secondary | ICD-10-CM | POA: Diagnosis not present

## 2020-09-06 DIAGNOSIS — R7989 Other specified abnormal findings of blood chemistry: Secondary | ICD-10-CM | POA: Diagnosis not present

## 2020-09-06 DIAGNOSIS — J849 Interstitial pulmonary disease, unspecified: Secondary | ICD-10-CM | POA: Diagnosis not present

## 2020-09-07 DIAGNOSIS — R7989 Other specified abnormal findings of blood chemistry: Secondary | ICD-10-CM | POA: Diagnosis not present

## 2020-09-07 DIAGNOSIS — J9600 Acute respiratory failure, unspecified whether with hypoxia or hypercapnia: Secondary | ICD-10-CM | POA: Diagnosis not present

## 2020-09-07 DIAGNOSIS — J441 Chronic obstructive pulmonary disease with (acute) exacerbation: Secondary | ICD-10-CM | POA: Diagnosis not present

## 2020-09-07 DIAGNOSIS — N390 Urinary tract infection, site not specified: Secondary | ICD-10-CM | POA: Diagnosis not present

## 2020-09-09 ENCOUNTER — Telehealth: Payer: Self-pay | Admitting: Pulmonary Disease

## 2020-09-09 DIAGNOSIS — J449 Chronic obstructive pulmonary disease, unspecified: Secondary | ICD-10-CM | POA: Diagnosis not present

## 2020-09-09 DIAGNOSIS — J8410 Pulmonary fibrosis, unspecified: Secondary | ICD-10-CM | POA: Diagnosis not present

## 2020-09-09 DIAGNOSIS — R42 Dizziness and giddiness: Secondary | ICD-10-CM | POA: Diagnosis not present

## 2020-09-09 DIAGNOSIS — Z9981 Dependence on supplemental oxygen: Secondary | ICD-10-CM | POA: Diagnosis not present

## 2020-09-09 NOTE — Telephone Encounter (Signed)
Followed by Dr. Chase Caller for pulmonary fibrosis.  Received page from home health agency.  Ashlee Martin recently admitted to [facility] for COPD exacerbation and UTI.  She was under care of Dr. Jerene Bears.  She was discharged home on cipro.  Ms. Glomb reports symptoms of dizziness and home health agency concerned cipro could be cause.  Explained that PCCM doesn't manage patients at [facility], and therefore not familiar with all the details of Ashlee Martin's recent hospitalization.  Advised home health agency to contact Dr. Marcial Pacas service for further advice.

## 2020-09-10 NOTE — Telephone Encounter (Signed)
Agree and thanks Vineet. Will close message    SIGNATURE    Dr. Brand Males, M.D., F.C.C.P,  Pulmonary and Critical Care Medicine Staff Physician, [place] Director - Interstitial Lung Disease  Program  Pulmonary [place] at [place], [place], [address]  Pager: [phone], If no answer  OR between  19:00-7:00h: page [phone] Telephone (clinical office): [phone] Telephone (research): [phone]  3:34 PM 09/10/2020

## 2020-09-12 DIAGNOSIS — F330 Major depressive disorder, recurrent, mild: Secondary | ICD-10-CM | POA: Diagnosis not present

## 2020-09-12 DIAGNOSIS — Z9981 Dependence on supplemental oxygen: Secondary | ICD-10-CM | POA: Diagnosis not present

## 2020-09-12 DIAGNOSIS — N183 Chronic kidney disease, stage 3 unspecified: Secondary | ICD-10-CM | POA: Diagnosis not present

## 2020-09-12 DIAGNOSIS — J449 Chronic obstructive pulmonary disease, unspecified: Secondary | ICD-10-CM | POA: Diagnosis not present

## 2020-09-12 DIAGNOSIS — J8410 Pulmonary fibrosis, unspecified: Secondary | ICD-10-CM | POA: Diagnosis not present

## 2020-09-13 DIAGNOSIS — J8410 Pulmonary fibrosis, unspecified: Secondary | ICD-10-CM | POA: Diagnosis not present

## 2020-09-13 DIAGNOSIS — J449 Chronic obstructive pulmonary disease, unspecified: Secondary | ICD-10-CM | POA: Diagnosis not present

## 2020-09-13 DIAGNOSIS — N183 Chronic kidney disease, stage 3 unspecified: Secondary | ICD-10-CM | POA: Diagnosis not present

## 2020-09-13 DIAGNOSIS — I10 Essential (primary) hypertension: Secondary | ICD-10-CM | POA: Diagnosis not present

## 2020-09-13 DIAGNOSIS — Z299 Encounter for prophylactic measures, unspecified: Secondary | ICD-10-CM | POA: Diagnosis not present

## 2020-09-24 DIAGNOSIS — I10 Essential (primary) hypertension: Secondary | ICD-10-CM | POA: Diagnosis not present

## 2020-09-24 DIAGNOSIS — E785 Hyperlipidemia, unspecified: Secondary | ICD-10-CM | POA: Diagnosis not present

## 2020-10-07 DIAGNOSIS — M1611 Unilateral primary osteoarthritis, right hip: Secondary | ICD-10-CM | POA: Diagnosis not present

## 2020-10-07 DIAGNOSIS — J849 Interstitial pulmonary disease, unspecified: Secondary | ICD-10-CM | POA: Diagnosis not present

## 2020-10-07 DIAGNOSIS — R0902 Hypoxemia: Secondary | ICD-10-CM | POA: Diagnosis not present

## 2020-10-07 DIAGNOSIS — Z96649 Presence of unspecified artificial hip joint: Secondary | ICD-10-CM | POA: Diagnosis not present

## 2020-10-15 DIAGNOSIS — Z20822 Contact with and (suspected) exposure to covid-19: Secondary | ICD-10-CM | POA: Diagnosis not present

## 2020-10-15 DIAGNOSIS — I361 Nonrheumatic tricuspid (valve) insufficiency: Secondary | ICD-10-CM | POA: Diagnosis not present

## 2020-10-15 DIAGNOSIS — D649 Anemia, unspecified: Secondary | ICD-10-CM | POA: Diagnosis not present

## 2020-10-15 DIAGNOSIS — R0602 Shortness of breath: Secondary | ICD-10-CM | POA: Diagnosis not present

## 2020-10-15 DIAGNOSIS — I2781 Cor pulmonale (chronic): Secondary | ICD-10-CM | POA: Diagnosis not present

## 2020-10-15 DIAGNOSIS — Z87891 Personal history of nicotine dependence: Secondary | ICD-10-CM | POA: Diagnosis not present

## 2020-10-15 DIAGNOSIS — N189 Chronic kidney disease, unspecified: Secondary | ICD-10-CM | POA: Diagnosis not present

## 2020-10-15 DIAGNOSIS — I350 Nonrheumatic aortic (valve) stenosis: Secondary | ICD-10-CM | POA: Diagnosis not present

## 2020-10-15 DIAGNOSIS — I5031 Acute diastolic (congestive) heart failure: Secondary | ICD-10-CM | POA: Diagnosis not present

## 2020-10-15 DIAGNOSIS — I509 Heart failure, unspecified: Secondary | ICD-10-CM | POA: Diagnosis not present

## 2020-10-15 DIAGNOSIS — I272 Pulmonary hypertension, unspecified: Secondary | ICD-10-CM | POA: Diagnosis not present

## 2020-10-15 DIAGNOSIS — J9621 Acute and chronic respiratory failure with hypoxia: Secondary | ICD-10-CM | POA: Diagnosis not present

## 2020-10-15 DIAGNOSIS — J8410 Pulmonary fibrosis, unspecified: Secondary | ICD-10-CM | POA: Diagnosis not present

## 2020-10-15 DIAGNOSIS — E7800 Pure hypercholesterolemia, unspecified: Secondary | ICD-10-CM | POA: Diagnosis not present

## 2020-10-15 DIAGNOSIS — E875 Hyperkalemia: Secondary | ICD-10-CM | POA: Diagnosis not present

## 2020-10-15 DIAGNOSIS — I130 Hypertensive heart and chronic kidney disease with heart failure and stage 1 through stage 4 chronic kidney disease, or unspecified chronic kidney disease: Secondary | ICD-10-CM | POA: Diagnosis not present

## 2020-10-16 ENCOUNTER — Other Ambulatory Visit: Payer: Self-pay | Admitting: Gastroenterology

## 2020-10-16 ENCOUNTER — Ambulatory Visit: Payer: Medicare HMO | Admitting: Internal Medicine

## 2020-10-18 ENCOUNTER — Other Ambulatory Visit: Payer: Self-pay | Admitting: Nurse Practitioner

## 2020-10-19 ENCOUNTER — Other Ambulatory Visit: Payer: Self-pay

## 2020-10-19 NOTE — Patient Outreach (Signed)
[place] [facility]) Care Management  10/19/2020  Lilley Hubble [place] 1931/08/18 [id number]   Referral Date: 10/19/20 Referral Source: [employer] Report Date of Discharge: 10/18/20 Facility:  [facility] Insurance: [facility]   Referral received. No outreach warranted at this time. Transition of Care will be completed by primary care provider office who will refer to [facility] care management if needed.  Plan: RN CM will close case.  Jone Baseman, RN, MSN [place] Management Care Management Coordinator Direct Line [phone] Cell [phone] Toll Free: [phone]  Fax: [phone]

## 2020-10-22 DIAGNOSIS — E785 Hyperlipidemia, unspecified: Secondary | ICD-10-CM | POA: Diagnosis not present

## 2020-10-22 DIAGNOSIS — I10 Essential (primary) hypertension: Secondary | ICD-10-CM | POA: Diagnosis not present

## 2020-10-22 NOTE — Telephone Encounter (Signed)
Is patient needing Anusol rectal cream?  Looks like when she saw Vicente Males back in November 2021, she was doing well.  Appears Anusol is not covered under patient's insurance.

## 2020-10-23 DIAGNOSIS — E785 Hyperlipidemia, unspecified: Secondary | ICD-10-CM | POA: Diagnosis not present

## 2020-10-23 DIAGNOSIS — J449 Chronic obstructive pulmonary disease, unspecified: Secondary | ICD-10-CM | POA: Diagnosis not present

## 2020-10-23 DIAGNOSIS — I509 Heart failure, unspecified: Secondary | ICD-10-CM | POA: Diagnosis not present

## 2020-10-23 DIAGNOSIS — I110 Hypertensive heart disease with heart failure: Secondary | ICD-10-CM | POA: Diagnosis not present

## 2020-10-23 DIAGNOSIS — K219 Gastro-esophageal reflux disease without esophagitis: Secondary | ICD-10-CM | POA: Diagnosis not present

## 2020-10-23 NOTE — Telephone Encounter (Signed)
Tried calling pt. Number was busy x 2.

## 2020-10-25 DIAGNOSIS — I959 Hypotension, unspecified: Secondary | ICD-10-CM | POA: Diagnosis not present

## 2020-10-25 DIAGNOSIS — J84112 Idiopathic pulmonary fibrosis: Secondary | ICD-10-CM | POA: Diagnosis not present

## 2020-10-25 DIAGNOSIS — N184 Chronic kidney disease, stage 4 (severe): Secondary | ICD-10-CM | POA: Diagnosis not present

## 2020-10-25 DIAGNOSIS — J9611 Chronic respiratory failure with hypoxia: Secondary | ICD-10-CM | POA: Diagnosis not present

## 2020-10-25 DIAGNOSIS — I2781 Cor pulmonale (chronic): Secondary | ICD-10-CM | POA: Diagnosis not present

## 2020-10-25 DIAGNOSIS — M549 Dorsalgia, unspecified: Secondary | ICD-10-CM | POA: Diagnosis not present

## 2020-10-25 DIAGNOSIS — Z515 Encounter for palliative care: Secondary | ICD-10-CM | POA: Diagnosis not present

## 2020-10-26 DIAGNOSIS — N183 Chronic kidney disease, stage 3 unspecified: Secondary | ICD-10-CM | POA: Diagnosis not present

## 2020-10-26 DIAGNOSIS — I5032 Chronic diastolic (congestive) heart failure: Secondary | ICD-10-CM | POA: Diagnosis not present

## 2020-10-26 DIAGNOSIS — Z09 Encounter for follow-up examination after completed treatment for conditions other than malignant neoplasm: Secondary | ICD-10-CM | POA: Diagnosis not present

## 2020-10-26 DIAGNOSIS — I10 Essential (primary) hypertension: Secondary | ICD-10-CM | POA: Diagnosis not present

## 2020-10-26 DIAGNOSIS — Z299 Encounter for prophylactic measures, unspecified: Secondary | ICD-10-CM | POA: Diagnosis not present

## 2020-10-26 DIAGNOSIS — J449 Chronic obstructive pulmonary disease, unspecified: Secondary | ICD-10-CM | POA: Diagnosis not present

## 2020-11-04 DIAGNOSIS — M1611 Unilateral primary osteoarthritis, right hip: Secondary | ICD-10-CM | POA: Diagnosis not present

## 2020-11-04 DIAGNOSIS — J849 Interstitial pulmonary disease, unspecified: Secondary | ICD-10-CM | POA: Diagnosis not present

## 2020-11-04 DIAGNOSIS — R0902 Hypoxemia: Secondary | ICD-10-CM | POA: Diagnosis not present

## 2020-11-04 DIAGNOSIS — Z96649 Presence of unspecified artificial hip joint: Secondary | ICD-10-CM | POA: Diagnosis not present

## 2020-11-12 ENCOUNTER — Telehealth: Payer: Self-pay | Admitting: Internal Medicine

## 2020-11-12 NOTE — Telephone Encounter (Signed)
Patient called again asking if a nurse was going to call her back about her pain, I offered her an appointment tomorrow at 8 am and she declined.  Asked if they could "just call her and tell her what to take"

## 2020-11-12 NOTE — Telephone Encounter (Signed)
PATIENT CALLED AND SAID THAT SHE IS HAVING STOMACH PAIN, WANTS TO KNOW WHAT SHE CAN TAKE  [phone]

## 2020-11-13 NOTE — Telephone Encounter (Signed)
See other phone note

## 2020-11-13 NOTE — Telephone Encounter (Signed)
Returned the pt's call and LM on vm to return call

## 2020-11-14 NOTE — Telephone Encounter (Signed)
FYI: I had attempted to call this pt back and I finally reached her and she has refused to come in for an appt but did advise me she had been dealing with diarrhea and vomiting and not being able to keep anything down. She made it to the pharmacy and they advised her on some things to take and she did. Hasn't vomited today or had diarrhea. She states she feels a little better but I advised her again that we can get her in for an appt but she declined. She just wanted to let us know today. Pt was advised that if she feels weak, dizzy, fainting like spells to go be evaluated at the ER.

## 2020-11-14 NOTE — Telephone Encounter (Signed)
Make sure taking Protonix daily. I am glad she feels better but not much we can do over the phone, unfortunately. Recommend office visit vs ED eval (if sever) if recurs.

## 2020-11-14 NOTE — Telephone Encounter (Signed)
Phoned the pt again this morning, LM for her to return call.

## 2020-11-15 NOTE — Telephone Encounter (Signed)
Phoned and spoke with the pt and was advised by her that she is taking her Protonix and that she is much better with what the pharmacist recommended she take. (pt didn't tell me what it was yesterday or today) but states she is able to hold down/in her food and liquids.

## 2020-11-20 NOTE — Telephone Encounter (Signed)
Lmom, waiting on a return call.  

## 2020-11-21 DIAGNOSIS — K219 Gastro-esophageal reflux disease without esophagitis: Secondary | ICD-10-CM | POA: Diagnosis not present

## 2020-11-21 DIAGNOSIS — I10 Essential (primary) hypertension: Secondary | ICD-10-CM | POA: Diagnosis not present

## 2020-11-22 DIAGNOSIS — J84112 Idiopathic pulmonary fibrosis: Secondary | ICD-10-CM | POA: Diagnosis not present

## 2020-11-22 DIAGNOSIS — I2781 Cor pulmonale (chronic): Secondary | ICD-10-CM | POA: Diagnosis not present

## 2020-11-22 DIAGNOSIS — N184 Chronic kidney disease, stage 4 (severe): Secondary | ICD-10-CM | POA: Diagnosis not present

## 2020-11-22 DIAGNOSIS — I959 Hypotension, unspecified: Secondary | ICD-10-CM | POA: Diagnosis not present

## 2020-11-22 DIAGNOSIS — J9611 Chronic respiratory failure with hypoxia: Secondary | ICD-10-CM | POA: Diagnosis not present

## 2020-11-22 DIAGNOSIS — Z515 Encounter for palliative care: Secondary | ICD-10-CM | POA: Diagnosis not present

## 2020-11-23 ENCOUNTER — Other Ambulatory Visit: Payer: Self-pay

## 2020-11-23 ENCOUNTER — Ambulatory Visit (INDEPENDENT_AMBULATORY_CARE_PROVIDER_SITE_OTHER): Payer: Medicare HMO | Admitting: Internal Medicine

## 2020-11-23 ENCOUNTER — Encounter: Payer: Self-pay | Admitting: Internal Medicine

## 2020-11-23 VITALS — BP 118/72 | HR 72 | Temp 97.8°F | Ht 65.0 in | Wt 115.4 lb

## 2020-11-23 DIAGNOSIS — R5381 Other malaise: Secondary | ICD-10-CM | POA: Diagnosis not present

## 2020-11-23 DIAGNOSIS — J84112 Idiopathic pulmonary fibrosis: Secondary | ICD-10-CM | POA: Diagnosis not present

## 2020-11-23 DIAGNOSIS — J9611 Chronic respiratory failure with hypoxia: Secondary | ICD-10-CM | POA: Diagnosis not present

## 2020-11-23 NOTE — Progress Notes (Signed)
PCP Glenda Chroman, MD  HPI  IOV 11/03/2017  Chief Complaint  Patient presents with  . Consult    Self referral. Pt states she was diagnosed with pulmonary fibrosis x4 weeks ago. States she has c/o cough with green mucus, mild SOB. Denies any CP.    87 year old female self-referred for evaluation of pulmonary fibrosis.  She is fairly good historian.  She tells me that she has diagnosis of psoriasis of the skin in 2016 and was under the care of Dr. Channing Mutters in [place].  She was on Humira for 11 months taking monthly injections but her rash got bad in November 2017 and then she was treated with prednisone.  She went on vacation to [place] and then returned and then had Ashlee diagnosis of pneumonia.  She was hospitalized for this at [facility] in January 2018.  She was on oxygen and hospitalized for 3 or 4 days and then discharged.  After that she is always had some amount of residual cough and mild shortness of breath but starting approximately 6 months ago the cough and shortness of breath was more perceptible.  However in the last 2 months Ashlee significant acceleration in the severity of cough and shortness of breath.  The cough is described as waking up at night and associated with phlegm.  She also gets short of breath walking or hurrying on level ground or walking up Ashlee slight hill.  Her primary care physician did Ashlee CT chest with contrast in February 2019.  I do not have the image with me but I reviewed the report and he reports pulmonary fibrosis not otherwise specified.  She did end up in the emergency room on October 31, 2017 and is being discharged with [facility] and redness on taper which is helping.   American College of chest physicians interstitial lung disease questionnaire  Past medical history: Positive for seizures, history of pneumonia in January 2018, acid reflux disease, dry eyes, dry mouth, photophobia and arthralgia.  She has diagnosis of skin psoriasis.  This is not much of an issue  at this point in time  Personal exposure history: She started smoking cigarettes at 87 years of age and smoked 3-4 packs Ashlee day and quit in the mid 1980s.  She reports having had use street drugs.  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  She has 2-6 chicken.  She only occasionally visits the coop.  She opens the door and looks that she can out.  Otherwise no exposure to water damage or mold Ashlee humidifier [place] or hot tub or Jacuzzi or birds or feathered pillows.  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  Then she retired and moved to [place] in [place] where she lived in normal homes for 5 years up until 8 years ago and then she and her husband moved to [place], [place] where they live in Ashlee farm.  Pulmonary toxicity history: Other than exposure to Humira and short-term prednisone no other pulmonary toxicity history including nitrofurantoin or BCG or amiodarone or chemotherapy    Walking desaturation test on 11/03/2017 185 feet x 3 laps on ROOM AIR:  did walk at slow pace and got mildly dyspneic. Did desaturate to 89%. Rest pulse ox was 98%, final pulse ox was 89%. HR response 67/min at rest to 91/min at peak exertion. [name]  Did  not Desaturate < 88% . Ashlee Martin did yes  Desaturated </= 3% points. Ashlee Martin yes did get tachyardic    12/08/2017 Acute OV :Sinus congestion .  Patient presents for an acute office visit.  Patient complains of sinus congestion , ear fullness and popping , sinus pressure , drippy nose, watery eyes, very little cough .  Has had this for last few months , Worse for the last 2 weeks .  Seen by PCP last week started on allergy pill and nasal spray , she does not know what they were.  Says she has chronic allergy problems , worse in Spring and Fall.  She denies any discolored mucus, sinus pain, teeth pain, fever, nausea vomiting diarrhea, or  chest pain.    Patient was seen for pulmonary consult March 2019 to establish for pulmonary fibrosis.  She has underlying psoriasis.  Patient was set up for Ashlee CT chest that showed fibrotic interstitial lung disease with mild honeycombing.  Autoimmune panel was essentially negative except for SS B IgG antibody.  And sed rate was mildly elevated at 34. Patient has an upcoming PFT and follow-up visit with ILD clinic in 2 weeks. She was having Ashlee severe cough last visit.  She was given Ashlee prednisone taper.  Patient says she did require one other prednisone taper but her cough is almost totally resolved.  She has Ashlee little mild cough in the morning but very mild.   OV 12/29/2017  Chief Complaint  Patient presents with  . Follow-up    Pt states she has had both good days and bad days. States she has had some problems with allergies.    Ashlee Martin presns for followup for ILD workup.it is documented below.  Pulmonary function test in terms of severity shows moderate restriction with significant reduction in diffusion capacity.  Her high-resolution CT scan of the chest is read as probable UIP but I wonder if that is mixed emphysema although she quit smoking many decades ago and indeterminate pattern for UIP.  There might be air trapping but this is not reported.  I again went over her exposures and she confirms and her husband who is here with her today that there is significant chicken coop exposure ongoing currently.  There are no new interim issues other than the fact she had Ashlee respiratory exacerbation which required prednisone which seemed to help her symptoms considerably.  Currently she is off prednisone.  Her autoimmune vasculitis and hypersensitivity pneumonitis panel was essentially negative except for trace autoimmune positivity.     Results for Ashlee Martin, Ashlee Martin (MRN [id number]) as of 12/29/2017 13:37  Ref. Range 12/29/2017 12:43  FVC-Pre Latest Units: L 1.49  FVC-%Pred-Pre Latest Units: % 58   FEV1-Pre Latest Units: L 1.18  FEV1-%Pred-Pre Latest Units: % 62  Pre FEV1/FVC ratio Latest Units: % 79  FEV1FVC-%Pred-Pre Latest Units: % 108  Results for Ashlee Martin, Ashlee Martin (MRN [id number]) as of 12/29/2017 13:37  Ref. Range 12/29/2017 12:43  DLCO unc Latest Units: ml/min/mmHg 7.82  DLCO unc % pred Latest Units: % 30   Results for Ashlee Martin, Ashlee Martin (MRN [id number]) as of 12/29/2017 13:37  Ref. Range 11/03/2017 10:08  ASPERGILLUS FUMIGATUS Latest Ref Range: NEGATIVE  NEGATIVE  Pigeon Serum Latest Ref Range: NEGATIVE  NEGATIVE  Anit Nuclear Antibody(ANA) Latest Ref Range: NEGATIVE  NEGATIVE  Angiotensin-Converting Enzyme Latest Ref Range: 9 - 67 U/L 28  Cyclic Citrullin Peptide Ab Latest Units: UNITS <16  ds DNA Ab Latest Units: IU/mL <1  ENA RNP  Ab Latest Ref Range: 0.0 - 0.9 AI <0.2  Myeloperoxidase Abs Latest Units: AI <1.0  Serine Protease 3 Latest Units: AI <1.0  RA Latex Turbid. Latest Ref Range: <14 IU/mL <14  SSA (Ro) (ENA) Antibody, IgG Latest Ref Range: <1.0 NEG AI <1.0 NEG  SSB (La) (ENA) Antibody, IgG Latest Ref Range: <1.0 NEG AI 1.8 POS (Ashlee)  Scleroderma (Scl-70) (ENA) Antibody, IgG Latest Ref Range: <1.0 NEG AI <1.0 NEG    IMPRESSION: 1. Spectrum of findings compatible with fibrotic interstitial lung disease with mild honeycombing. Despite the absence of Ashlee clear basilar gradient, these findings are considered to represent probable usual interstitial pneumonia (UIP). Fibrotic phase nonspecific interstitial pneumonia (NSIP) is on the differential. Follow-up high-resolution chest CT in 6-12 months would be useful to assess temporal pattern stability, as clinically warranted. 2. Tiny 3 mm solid left lower lobe pulmonary nodule. No follow-up needed if patient is low-risk. Non-contrast chest CT can be considered in 12 months if patient is high-risk. This recommendation follows the consensus statement: Guidelines for Management of Incidental Pulmonary Nodules Detected on CT Images:From  the Fleischner Society 2017; published online before print (10.1148/radiol.6803212248). 3. Three-vessel coronary atherosclerosis. 4. Dilated main pulmonary artery, suggesting pulmonary arterial hypertension.  Aortic Atherosclerosis (ICD10-I70.0).   Electronically Signed   By: Ilona Sorrel M.D.   On: 11/13/2017 16:46   OV 02/02/2018  Chief Complaint  Patient presents with  . Follow-up    Pt states things have been up and down since last visit and since bronch was performed. Pt still has problems with upset stomach and pt is still having problems with her breathing. Pt does cough in the morning with occ. green mucus. Denies any CP/chest tightness.    Follow-up interstitial lung disease.  This follow-up is after bronchoscopy with lavage which was done Jan 07, 2018.  This shows Ashlee slight preponderance of polymorphs and absence of lymphocytes thus suggesting this is not hypersensitivity pneumonitis but with the increase in polymorphs the diagnosis shift towards UIP.  I took Ashlee second opinion on radiology from Dr. Lorin Picket and she feels Ashlee CT scan is more consistent with UIP.  In the interim patient has no new complaints.  Walking desaturation test indicates Ashlee 7% pulse ox drop with tachycardia and shortness of breath but still does not drop below 88%.  Of note she does indicate that she has had chronic intermittent stress related nausea that has been worked up extensively but without any cause.   Results for Ashlee Martin, Ashlee Martin (MRN [id number]) as of 01/07/2018 20:04  Ref. Range 01/07/2018 14:35  Color, Fluid Latest Ref Range: YELLOW  STRAW (Ashlee)  WBC, Fluid Latest Ref Range: 0 - 1,000 cu mm 73  Lymphs, Fluid Latest Units: % 6  Eos, Fluid Latest Units: % 4  Appearance, Fluid Latest Ref Range: CLEAR  CLOUDY (Ashlee)  Other Cells, Fluid Latest Units: % CORRELATE WITH CY...  Neutrophil Count, Fluid Latest Ref Range: 0 - 25 % 55 (H)  Monocyte-Macrophage-Serous Fluid Latest Ref Range: 50 - 90 % 35 (L)      OV 03/15/2018  Chief Complaint  Patient presents with  . Follow-up    Pt began taking Esbriet but had been having problems keeping food down while taking the med. Med was stopped 7/12 by MW due to the possible reaction and was told to stop med until OV with MR. Other than the beginning problems with Esbriet, pt states she has been doing good since last visit   Ashlee Martin  Ashlee Martin , 87 y.o. , with dob 31-Aug-1930 and female ,Not Hispanic or Latino from [address] [place] [address] - presents to ILD  clinic for IPF diagnosis (he is aage > 39 (age 59), probable UIP on CT scan with honeycombing early, negative autoimmune and vasculitis serology and bronchoscopy 01/07/18 with 55% neutrophils on lavage) made 02/02/18 and esbriet decision taken 02/02/18   In terms of her IPF she is doing stable. She does not feel any worse. She gets dyspneic when walking out of her air conditioned room to the hot humid air she does not feel any worse. Walking desaturation test showed desaturation to 87% which seems Ashlee little bit worse than before but she denies that she is worse. She does not want to use portable oxygen but she is willing to get herself tested at night. There is no worsening cough or sputum production.  The main issue appears to be that on 03/05/2018 she started her first dose of Pirfenidone (Esbriet). She took one dose and felt fine and then 4 hours later immediately after lunch 12 her food and had nausea and vomiting. She does not believe that the nausea and vomiting were related to the Pirfenidone (Esbriet). She insetead thinks it is Ashlee recurrence of her chronic intermittent nausea and vomiting which is had for the last few years. We took more history on this and she tells me that she's had this for the last few years. It is stable. She says primary care physician worked up extensively. However shnever  gastroenterologist or had an endoscopy for this.   OV 04/27/2018  Chief Complaint  Patient  presents with  . Follow-up    Pt had endoscopy performed 8/27. Pt states she has been having problems with pain and states she still has occ SOB but that is better and has an occ cough in the mornings. Denies any CP.     Ashlee Martin , 87 y.o. , with dob 1931-04-07 and female ,Not Hispanic or Latino from [address] [place] [address] - presents to ILD  clinic for IPF diagnosis (he is aage > 18 (age 25), probable UIP on CT scan with honeycombing early, negative autoimmune and vasculitis serology and bronchoscopy 01/07/18 with 55% neutrophils on lavage) made 02/02/18 and esbriet decision taken 02/02/18 , restart date 03/15/18   Ashlee Martin Ashlee Arney - presents for follow-up of her IPF. Currently she restarted her Pirfenidone (Esbriet) 03/15/2018 and escalated to 2 pills 3 times Ashlee day which she has held at that dose. She says that her fatigue is gone from mild pre-Pirfenidone (Esbriet) 2 severe post Pirfenidone (Esbriet). In addition she has weight loss. Her weight loss is 9 poundssince she started the Pirfenidone (Esbriet). She is also having extremely poor appetite. She did seeGI Dr. Barney Drain and had endoscopy 04/20/2018. According to the patientit sounds like she might have had gastritis but I'm not so sure. She's also had ultrasound of the liver and is awaiting the results. She tells me 4 days up with endoscopy she did have one episodes of nausea and vomiting. She feels that the severe D of the nausea and vomiting and the frequency of this is at baseline and she does not think the Pirfenidone (Esbriet) has made this worse. However she is categorical that the fatigue low appetite and weight loss are related to Pirfenidone (Esbriet).She is using oxygen with pulmonary rehabilitation. She is more short of breath. Walking desaturation test today shows that she did  desaturate much earlier than at baseline. Also 03/31/2018 she had overnight oxygen study and the pulse ox was less than 88%  For 7-1/2 hours. She  is refusing to wear portable oxygen. She has agreed to wear oxygen during rehabilitation and at night  OV 05/26/2018  Subjective:  Patient ID: Ashlee Martin, female , DOB: 07-01-1931 , age 62 y.o. , MRN: [id number] , ADDRESS: [place] [id number]   05/26/2018 -   Chief Complaint  Patient presents with  . Follow-up    PFT attempted but pt was unable to complete it. Pt has had worsening  and was admitted to the hospital 9/11-9/13 due to Ashlee UTI and O2 sats dropping to 70%. Pt states she does have Ashlee mild cough in the mornings. Denies any complaints of CP. Pt is now having to wear O2 due to her breathing worsening and is on 3L with exertion and 2L at rest.     HPI Ashlee Martin 87 y.o. -presents IPF-year follow-up.  Last seen April 27, 2018.  Just before that end of August 2019 she underwent endoscopy that showed acid reflux and peptic stricture.  According to the patient she was stretched.  She has stopped as.  After the visit April 27, 2018.  Her vomiting episodes have improved although not completely resolved..  It is unclear if this is because of Pirfenidone (Esbriet) being stopped or because of the endoscopy.  In any event May 05, 2018 she was admitted at [facility].  She brought the outside records with me.  It appears that she had E. coli UTI.  In addition her BNP was high at 1600.  She had Ashlee CT scan of the chest that I was able to visualized.  There is evidence of worsening of ILD but this was in the form of Ashlee high BNP.  At this point in time she is better but somewhat fatigued.  She has been discharged with continuous oxygen although when we turned her oxygen off which was set at 3 L and turn it off for 20 minutes she had normal pulse ox at rest and desaturated at the first left.  So it appears that her hypoxemia is improved compared to admission but it is still worse compared to Ashlee visit 1 month ago.       ROS  OV 06/24/2018  Subjective:  Patient ID:  Ashlee Martin, female , DOB: 15-Sep-1930 , age 41 y.o. , MRN: [id number] , ADDRESS: [place] [id number]   06/24/2018 -   Chief Complaint  Patient presents with  . Follow-up    States her breathing has been good at her basline. Stll concerned about her weight.      HPI Ashlee Martin 87 y.o. -has idiopathic pulmonary fibrosis.  Currently on supportive care of having significant side effects suspected from Pirfenidone (Esbriet).  At this point in time she is been off Pirfenidone (Esbriet) for several weeks.  She is now gained 5 pounds of weight.  Her appetite is returned to normal.  She still has an occasional mild intermittent nausea vomiting which is her baseline idiopathic problem.  She does not have any diarrhea.  She is also started using oxygen.  Without being on Esbriet and also being on oxygen she feels her quality of life is actually improved with less shortness of breath.  She and she is less miserable.  However CT scan and pulmonary function tests show slight decline in  lung function with no fibrosis as documented below.  The CT scan was personally visualized by me.  She continues to have some cough for which she wants [employer]  We discussed nintedanib as an alternative option (see below().  We also discussed research as Ashlee care option but her low DLCO makes it difficult for her to qualify for IV or inhaler related studies that do not have GI side effects.   - OFEV  - - time to first exacerbation possibly reduced in one trial but not in another - twice daily, no titration, potentially more convenient dosing  - no need for sunscreen  - high chance of mild diarrhea but low chance of significant diarrhea needing to stop medication.   - Rx diarrhea with lomotil - slight increase in heart attack risk and theoretical increase in bleeding risk,   - need monthly blood work for 3 months and then every 6 months - monitor liver function       Results for NAYELI, CALVERT (MRN [id number]) as of 06/24/2018 09:25  Ref. Range 12/29/2017 12:43 06/16/2018 10:33  FVC-Pre Latest Units: L 1.49 1.54  FVC-%Pred-Pre Latest Units: % 58 60  Results for Clare, KEYARI KLEEMAN (MRN [id number]) as of 06/24/2018 09:25  Ref. Range 12/29/2017 12:43 06/16/2018 10:33  DLCO unc Latest Units: ml/min/mmHg 7.82 6.02  DLCO unc % pred Latest Units: % 30 23   Results for Voong, SHERA LAUBACH (MRN [id number]) as of 06/24/2018 09:25  Ref. Range 04/27/2018 15:06  Hemoglobin Latest Ref Range: 12.0 - 15.0 g/dL 13.6   HRCT 06/15/18 Lungs/Pleura: High-resolution images again demonstrate widespread areas of septal thickening, subpleural reticulation, thickening of the peribronchovascular interstitium, traction bronchiectasis and frank honeycombing. Very mild craniocaudal gradient. Findings appear slightly progressive compared to the prior study from 11/13/2017. No confluent consolidative airspace disease. No pleural effusions. Inspiratory and expiratory imaging demonstrates some mild to moderate air trapping indicative of small airways disease. No definite suspicious appearing pulmonary nodules or masses are noted. IMPRESSION: 1. Previously noted left Solorio lobe nodule has completely resolved, indicative of an infectious or inflammatory etiology on the prior study. 2. Chronic changes of interstitial lung disease again considered probable usual interstitial pneumonia (UIP) per ATS guidelines. 3. Aortic atherosclerosis, in addition to left main and 3 vessel coronary artery disease. Please note that although the presence of coronary artery calcium documents the presence of coronary artery disease, the severity of this disease and any potential stenosis cannot be assessed on this non-gated CT examination. Assessment for potential risk factor modification, dietary therapy or pharmacologic therapy may be warranted, if clinically indicated.  Aortic Atherosclerosis (ICD10-I70.0).   Electronically  Signed   By: Vinnie Langton M.D.   On: 06/15/2018 17:00    08/05/2018  - Visit   87 year old female patient completing follow-up with our office today. Pt is currently on OFEV and patient is doing well.  Patient attributes her success on the medication to to the nursing education from the pharmaceutical company.  Patient reports she has had occasional diarrhea but this is been managed well with Lomotil.  Patient feels much better on this medication than Esbriet.  Patient denies drops in oxygen and feels that she is able to do more things around the house.  Patient thinks the medication is going well.  Patient is interested in traveling to [place] as she usually does this every year.  Patient is wondering how to coordinate this with her oxygen needs now.  Patient is wondering if she  could switch her DME company from [facility] to [employer].   Records are showing that patient is due for pneumonia vaccine.  Patient believes she has received [facility] at [facility] she will check her records as well as at [facility] in bring to next office visit.    06/15/18 -CT chest high-res- previously noted left Bramble lobe nodules completely resolved, chronic changes of interstitial lung disease again considered probable UIP   OV 09/16/2018  Subjective:  Patient ID: Ashlee Martin, female , DOB: 1931-04-18 , age 71 y.o. , MRN: [id number] , ADDRESS: [place] [id number]   09/16/2018 -   Chief Complaint  Patient presents with  . Follow-up    Pt states she had been doing good until 4 days ago and stated she began throwing up on Sunday and also threw up Monday night as well as Tuesday and also had diarrhea. Pt states SOB is about the same as last visit.   IPF with chronic hypoxemic respiratory failure 2 L at rest.  Failed Esbriet due to GI intolerance.  Started on nintedanib early 2019 early November  HPI Ashlee Martin 87 y.o. -after starting nintedanib she saw Ashlee nurse practitioner mid  December 2019.  At that time she was tolerating it well.  Liver function test was fine.  She now reports for follow-up she tells me that she has new onset of diarrhea since starting nintedanib.  This happens around 2 times Ashlee week.  It happens abruptly without any warning and she wets her bed or with her self.  Otherwise the severity itself is only mild to moderate.  But the main issue is that it happens abruptly.  She responds by taking Imodium and then she does not have diarrhea for Ashlee few days till it recurs again abruptly.  In addition for the last few days she has had intermittent nausea and vomiting.  This was not an issue with nintedanib until now.  It was Ashlee big issue with Esbriet along with weight loss which made her stop the Esbriet.  She has not tried any Zofran for this.  Otherwise in terms of her IPF she feels clinically stable.  She is bothered Ashlee lot by chronic pain in her joints.  She will talk to her primary care physician about this.   OV 06/20/2019  Subjective:  Patient ID: Ashlee Martin, female , DOB: Jan 08, 1931 , age 45 y.o. , MRN: [id number] , ADDRESS: [place] [phone]   06/20/2019 -   Chief Complaint  Patient presents with  . Follow-up    Patient reports that she is doing well at this time.    IPF with chronic hypoxemic respiratory failure 2 L at rest.  Failed Esbriet due to GI intolerance.  Started on nintedanib early 2019 early November and stopped SEpt 2012  HPI Jaleiyah Ashlee Gingrich 87 y.o. -she has had at least 2 documented options to nintedanib  in April 2020 and currentl since September 2020.  There is possibly Ashlee third interaction that happened earlier in March 2020 but she does not reembmer  Most recently saw Ashlee nurse practitioner May 30, 2019 at which time she was off her nintedanib because of diarrhea.  She reported to the nurse practitioner that after stopping the nintedanib the diarrhea had improved and her fatigue improved and her appetite also  improved.  Overall quality of life is much better after stopping antifibrotic's.  And finally she started having weight  gain as well.  The nintedanib has been stopped at that point till September 2020. On May 31, 2019 she underwent colonoscopy by Dr. Trinda Pascal [this was 1 day after seeing our nurse practitioner in the office] this is because of rectal bleeding.  She was noticed to have Ashlee tortuous colon and diverticulosis in the rectosigmoid colon and the transverse colon and ascending colon.  The rectal bleeding was thought secondary to internal hemorrhoids.  No colonic source of weight loss was discovered.  She was recommended high-fiber diet.  And return to the office in 6 months.  At this point in time she tells me that she does not want to go back on pirfenidone or nintedanib.  Her weight has been.  She is feeling better.  She says she lost 15 pounds of weight.  She is interested in IV infusion therapy through research protocol if offered for her IPF.  She uses 2 L of oxygen at rest and exertion.  Occasionally bumped it up to 3 L. Walking desat test shows stability x 1 y ear  Last pulmonary function test was October 2019 Last high-resolution CT chest was October 2019    OV 09/29/2019  Subjective:  Patient ID: Ashlee Martin, female , DOB: 13-May-1931 , age 89 y.o. , MRN: [id number] , ADDRESS: [place] [address]   IPF with chronic hypoxemic respiratory failure 2 L at rest.  Failed Esbriet due to GI intolerance.  Started on nintedanib  2019 early November and stopped finally sept 2020 with 2-3 interruptions in between   09/29/2019 -  = telephone visit for IPF.  She is on supportive care but prescriptioin o2.  She recently had PFTs and cT and this is discuss those results. Identiied with 2 PHI. She has new coronary atery calcificatioand enalarged PA (new issues for [place]) - saw Dr Johnny Bridge cards- echo has been ordered and otherwise supportive care. Breathing stable. No fatigue  . No GI issues since off antifiboriics. On o2 - 2L at resting, 3L with walking and at night [facility]  WEnt over PFT - fvc stable, but dlco declined.- this is posing Ashlee challenge for research trials as care optinos for which she is interested. Either age or dlco is an issue. In future TETON study inhaler tyvaso can be an option if we are Ashlee site     OV 05/10/2020   Subjective:  Patient ID: Ashlee Martin, female , DOB: 01-12-1931, age 54 y.o. years. , MRN: [id number],  ADDRESS: [place] [address] PCP  Glenda Chroman, MD Providers : Treatment Team:  Attending Provider: Brand Males, MD   Chief Complaint  Patient presents with  . Follow-up    pt has had sob currently on 3 liters pulse oxygen,Sob on  exertion     IPF with chronic hypoxemic respiratory failure 2 L at rest.  Failed Esbriet due to GI intolerance.  Started on nintedanib  2019 early November and stopped finally sept 2020 with 2-3 interruptions in between.  Currently on supportive care l  Last echocardiogram March 2021 with evidence of pulmonary hypertension  HPI Metzli Ashlee Kuznia 87 y.o. - last seen by myself personally face-to-face October 2020.  Last interaction was Ashlee telephone visit in February 2021.  After that she had 3 telephone visit/face-to-face visits with nurse practitioner in February for back pain issues.  Then with pollen issues in June 2021 with nurse practitioner in August 2021 for follow-up with nurse practitioner.  She has been recommended home palliative care.  She says the visiting but not of much help.  It appears since I saw her last year she is having progressive dyspnea.  Her husband tells me that when she is resting she is fine but when she stands up and tries to exert herself she wobbles and is unsteady on her feet gets very short of breath.  She is able to barely go across from one end of the room to the other.  Her symptom score reflect this and she is significantly worse.  She is very  interested in clinical trials or research as Ashlee care option.  She investigated going to [facility]lee stem cell transplant center in [place].  She asked my opinion.  I recommended against this approach.  She does not qualify for Ashlee current existing trials because of an extremely low DLCO.   Along with the symptoms getting worse.  She is also had pulmonary function test in July 2021 which shows worsening DLCO and FVC.  Last CT scan of the chest was in January 2021.  She saw Dr. Domenic Polite in February 2021 cardiology.  After that and March 2020 when she had an echo.  There is evidence of pulmonary hypertension.  She not had Ashlee right heart cath.  Post echo in the summer 2021 inhaled treprostinil has been FDA approved although insurance is yet to pick it up for pulmonary hypertension associated with ILD [facility] 3]  She also tells me that in July 2021 when she ended up in the emergency room.  She was given Lasix and prednisone these things helped.  She is wondering about taking daily prednisone but at the same time she is wary and rejected the idea of chronic prednisone because of the long-term side effects. PFT Results Latest Ref Rng & Units 03/13/2020 09/01/2019 06/16/2018 12/29/2017  FVC-Pre L 1.57 1.70 1.54 1.49  FVC-Predicted Pre % 63 69 60 58  FVC-Post L 1.59 - - -  FVC-Predicted Post % 64 - - -  Pre FEV1/FVC % % 83 79 83 79  Post FEV1/FCV % % 83 - - -  FEV1-Pre L 1.30 1.35 1.27 1.18  FEV1-Predicted Pre % 71 74 66 62  FEV1-Post L 1.32 - - -  DLCO uncorrected ml/min/mmHg 4.52 5.15 6.02 7.82  DLCO UNC% % _0 DLVA Predicted % 42 48 40 53  TLC L 3.69 - - -  TLC % Predicted % 70 - - -  RV % Predicted % 75 - - -    IMPRESSION: 1. Pulmonary parenchymal pattern of fibrosis is unchanged from 11/13/2017 and likely due to usual interstitial pneumonitis. Findings are categorized as probable UIP per consensus guidelines: Diagnosis of Idiopathic Pulmonary Fibrosis: An  Official ATS/ERS/JRS/ALAT Clinical Practice Guideline. [place], Iss 5, [phone], Apr 25 2017. 2. Distal aortic arch aneurysm. Aortic aneurysm NOS (ICD10-I71.9). Recommend annual imaging followup by CTA or MRA. This recommendation follows 2010 ACCF/AHA/AATS/ACR/ASA/SCA/SCAI/SIR/STS/SVM Guidelines for the Diagnosis and Management of Patients with Thoracic Aortic Disease. Circulation.2010; 121: N539-Y728. Aortic aneurysm NOS (ICD10-I71.9). 3. Marginal irregularity the liver is indicative of cirrhosis. 4. Aortic atherosclerosis (ICD10-I70.0). Coronary artery calcification. 5. Enlarged pulmonic trunk, indicative of pulmonary arterial hypertension.   Electronically Signed   By: Lorin Picket M.D.   On: 09/07/2019 13:59 ROS - per HPI     has Ashlee past medical history of Arthritis, Coronary artery calcification seen on CT scan, Depression, Essential hypertension,  GERD (gastroesophageal reflux disease), History of seizures, Hypercholesterolemia, ILD (interstitial lung disease) ([place]), and Psoriasis.   reports that she quit smoking about 46 years ago. Her smoking use included cigarettes. She has Ashlee 60.00 pack-year smoking history. She has never used smokeless tobacco  OV 05/10/2020   OV 11/23/2020  Subjective:  Patient ID: Ashlee Martin, female , DOB: June 21, 1931 , age 63 y.o. , MRN: [id number] , ADDRESS: [place] [address] PCP Glenda Chroman, MD Patient Care Team: Glenda Chroman, MD as PCP - General (Internal Medicine) Satira Sark, MD as PCP - Cardiology (Cardiology) Danie Binder, MD (Inactive) as Consulting Physician (Gastroenterology) Eloise Harman, DO as Consulting Physician (Internal Medicine)  This Provider for this visit: Treatment Team:  Attending Provider: Brand Males, MD    11/23/2020 -   Chief Complaint  Patient presents with  . Follow-up    [place] Ashlee few weeks ago, breathing is "ok". Cannot do much.     IPF  with chronic hypoxemic respiratory failure .  Failed Esbriet due to GI intolerance.  Started on nintedanib  2019 early November and stopped finally sept 2020 with 2-3 interruptions in between.  Currently on supportive care .3L 02 as of 11/23/2020   Last echocardiogram March 2021 with evidence of pulmonary hypertension  Last CT Jan 2021  HPI Briauna Ashlee Parcher 87 y.o. -returns for follow-up of IPF.  She continues with 3 L oxygen.  She has lost weight.  She is more physically deconditioned.  She has home palliative care visiting with her.  She says that she fell down recently when she tried to get up.  This was on oxygen.  She felt dizzy and lost balance and fell down.  However no injuries.  She does not want to go to the hospital because every time she goes there admit her.  We discussed CODE STATUS.  She understands that the goal of care is comfort and symptom support.  However she wants to remain full code.  This is because she wants to outlive her husband who she has been married to for 37 years and he has been good to her.  In addition she wants to reach 87 years of age which is the death of her father or grandfather.  Did reflect to her that her current care plan is to make her live longer by controlling her symptoms and supporting her.  However the specific question was about CPR and intubation in case of cardiac arrest or respiratory failure.  She is going to reflect on this.  Offered follow-up with pulmonary colleagues at [place] but she prefers to come to [place] to the ILD center.    SYMPTOM SCALE - ILD 06/20/2019  05/10/2020  11/23/2020 89% RA  O2 use 2-3L 3L continuous 3L contniuous at ome  Shortness of Breath 0 -> 5 scale with 5 being worst (score 6 If unable to do)    At rest 0 0 1  Simple tasks - showers, clothes change, eating, shaving 0 5 5  Household (dishes, doing bed, laundry) 0 3.5 5  Shopping 3 3 but with wheel chair 5  Walking level at own pace 0 5 5  Walking keeping up with  others of same age 59 x 5  Walking up Stairs na 6 - do not do 6  Walking up [place] na x x  Total (40 - 48) Dyspnea Score x    How bad is your cough? 0 Not bad  1  How bad is your fatigue 2 Bad with exertin 5  nausea  no 5  vomit  no 0  diarreha  no 0  anxiety  Bad at time 5  deopression  badf at times 5        Simple office walk 185 feet x  3 laps goal with forehead probe 02/02/2018 Start esbriet 03/15/2018 contnue esbriet 04/27/2018 Stop esbriet 05/26/2018  06/20/2019   11/23/2020   O2 used Room air Room air Room air  room air Room air ra  Number laps completed _0 but desat at 2nd lap  desaturated at first lap deagurated at first lap   Comments about pace nrmal pacte Normal pace Slow pace  slow pace Slow pace   Resting Pulse Ox/HR 98% and 83/min 98% and HR 85/min 96% and HR 63/min  98% and 88/min 98% and 70/min   Final Pulse Ox/HR 91% and 98/min 87% and HR 91% at end of 3rd lao 87% and HR 79 at end of 2nd lap  86% and heart rate of 95 at first lab 87% and HR 84/in   Desaturated </= 88% no yes Yes -  yes    Desaturated <= 3% points yes yes yes  yes    Got Tachycardic >/= 90/min yes yes no  yes    Symptoms at end of test dyspnea Mild dyspnea x  dyspnea    Miscellaneous comments Normal pace Normal pace Worse desats  worsening desaturation.  Corrected with 3 laps of walking 2 L similr to before        PFT  PFT Results Latest Ref Rng & Units 03/13/2020 09/01/2019 06/16/2018 12/29/2017  FVC-Pre L 1.57 1.70 1.54 1.49  FVC-Predicted Pre % 63 69 60 58  FVC-Post L 1.59 - - -  FVC-Predicted Post % 64 - - -  Pre FEV1/FVC % % 83 79 83 79  Post FEV1/FCV % % 83 - - -  FEV1-Pre L 1.30 1.35 1.27 1.18  FEV1-Predicted Pre % 71 74 66 62  FEV1-Post L 1.32 - - -  DLCO uncorrected ml/min/mmHg 4.52 5.15 6.02 7.82  DLCO UNC% % _1 DLVA Predicted % 42 48 40 53  TLC L 3.69 - - -  TLC % Predicted % 70 - - -  RV % Predicted % 75 - - -    HCT Jan 2021   IMPRESSION: 1. Pulmonary  parenchymal pattern of fibrosis is unchanged from 11/13/2017 and likely due to usual interstitial pneumonitis. Findings are categorized as probable UIP per consensus guidelines: Diagnosis of Idiopathic Pulmonary Fibrosis: An Official ATS/ERS/JRS/ALAT Clinical Practice Guideline. [place], Iss 5, [phone], Apr 25 2017. 2. Distal aortic arch aneurysm. Aortic aneurysm NOS (ICD10-I71.9). Recommend annual imaging followup by CTA or MRA. This recommendation follows 2010 ACCF/AHA/AATS/ACR/ASA/SCA/SCAI/SIR/STS/SVM Guidelines for the Diagnosis and Management of Patients with Thoracic Aortic Disease. Circulation.2010; 121: U418-D373. Aortic aneurysm NOS (ICD10-I71.9). 3. Marginal irregularity the liver is indicative of cirrhosis. 4. Aortic atherosclerosis (ICD10-I70.0). Coronary artery calcification. 5. Enlarged pulmonic trunk, indicative of pulmonary arterial hypertension.   Electronically Signed   By: Lorin Picket M.D.   On: 09/07/2019 13:59    has Ashlee past medical history of Arthritis, Coronary artery calcification seen on CT scan, Depression, Essential hypertension, GERD (gastroesophageal reflux disease), History of seizures, Hypercholesterolemia, ILD (interstitial lung disease) ([place]), and Psoriasis.   reports that she quit smoking about 48 years ago. Her smoking use included cigarettes.  She has Ashlee 60.00 pack-year smoking history. She has never used smokeless tobacco.  Past Surgical History:  Procedure Laterality Date  . BIOPSY  04/20/2018   Procedure: BIOPSY;  Surgeon: Danie Binder, MD;  [facility];  Service: Endoscopy;;  gastric  . COLONOSCOPY  01/2011   Dr. Anthony Sar: normal.   . COLONOSCOPY WITH PROPOFOL N/Ashlee 05/31/2019   normal TI, multiple small and large-mouthed diverticula in recto-sgimoid, sigmoid, transverse  . DILATION AND CURETTAGE OF UTERUS     x3  . ESOPHAGOGASTRODUODENOSCOPY (EGD) WITH PROPOFOL N/Ashlee 04/20/2018   benign-appearing,  intrinsic mild stenosis, s/p dilation. Mild inflammation entire stomach, s/p biopsy that was negative for H.pylori. Pylorus normal but had changes consistent with PRIOR ulcer disease. Duodenum normal.   . ESOPHAGOGASTRODUODENOSCOPY (EGD) WITH PROPOFOL N/Ashlee 05/31/2019   mild gastirits, findings on CT likely due to sliding hiatal hernia, benign-appearing esophageal stricture due to GERD.  Marland Kitchen SAVORY DILATION N/Ashlee 04/20/2018   Procedure: SAVORY DILATION;  Surgeon: Danie Binder, MD;  Location: [facility];  Service: Endoscopy;  Laterality: N/Ashlee;  . VIDEO BRONCHOSCOPY Bilateral 01/07/2018   Procedure: VIDEO BRONCHOSCOPY WITHOUT FLUORO;  Surgeon: Brand Males, MD;  Location: [facility];  Service: Cardiopulmonary;  Laterality: Bilateral;  . WRIST SURGERY Right    ORIF    Allergies  Allergen Reactions  . Humira [Adalimumab] Other (See Comments)    Worsened your psoriasis.  . Pirfenidone Other (See Comments)    Fatigue and weight loss  . Ofev [Nintedanib] Rash    Immunization History  Administered Date(s) Administered  . Fluad Quad(high Dose 65+) 05/10/2019  . Influenza, High Dose Seasonal PF 06/10/2017, 04/27/2018, 05/10/2020  . Moderna Sars-Covid-2 Vaccination 10/12/2019, 11/11/2019, 12/12/2019  . Pneumococcal Conjugate-13 12/09/2014, 09/05/2018  . Pneumococcal Polysaccharide-23 01/13/2009    Family History  Problem Relation Age of Onset  . Heart disease Mother   . Heart disease Father   . Cancer Sister        GYN  . Colon cancer Neg Hx      Current Outpatient Medications:  .  aspirin 325 MG tablet, Take 325 mg by mouth daily., Disp: , Rfl:  .  atenolol (TENORMIN) 25 MG tablet, Take 25 mg by mouth 2 (two) times daily. , Disp: , Rfl:  .  Cholecalciferol (VITAMIN D-3) 125 MCG (5000 UT) TABS, Take by mouth daily., Disp: , Rfl:  .  diphenhydrAMINE HCl, Sleep, (SLEEP AID, DIPHENHYDRAMINE, PO), Take 50 mg by mouth as needed., Disp: , Rfl:  .  fexofenadine (ALLEGRA) 180 MG tablet,  Take 180 mg by mouth as needed. , Disp: , Rfl:  .  furosemide (LASIX) 40 MG tablet, Take by mouth., Disp: , Rfl:  .  hydrocortisone (ANUSOL-HC) 2.5 % rectal cream, PLACE 1 APPLICATION RECTALLY 2 (TWO) TIMES DAILY., Disp: 30 g, Rfl: 1 .  ipratropium-albuterol (DUONEB) 0.5-2.5 (3) MG/3ML SOLN, Inhale into the lungs., Disp: , Rfl:  .  loratadine (CLARITIN) 10 MG tablet, Take 10 mg by mouth at bedtime., Disp: , Rfl:  .  mirtazapine (REMERON) 7.5 MG tablet, Take 7.5 mg by mouth at bedtime., Disp: , Rfl:  .  Naproxen Sod-diphenhydrAMINE (ALEVE PM PO), Take by mouth as needed., Disp: , Rfl:  .  pantoprazole (PROTONIX) 40 MG tablet, TAKE 1 TABLET (40 MG TOTAL) BY MOUTH 2 (TWO) TIMES DAILY BEFORE Ashlee MEAL., Disp: 180 tablet, Rfl: 1 .  PARoxetine (PAXIL) 40 MG tablet, , Disp: , Rfl:  .  Phenylephrine-APAP-guaiFENesin (MUCINEX SINUS-MAX CONGESTION PO), Take  by mouth daily., Disp: , Rfl:  .  rOPINIRole (REQUIP) 3 MG tablet, , Disp: , Rfl:  .  simvastatin (ZOCOR) 20 MG tablet, Take 20 mg by mouth at bedtime. , Disp: , Rfl:  .  sodium chloride HYPERTONIC 3 % nebulizer solution, Take by nebulization 2 (two) times daily., Disp: 750 mL, Rfl: 3 .  vitamin B-12 (CYANOCOBALAMIN) 500 MCG tablet, Take 500 mcg by mouth daily., Disp: , Rfl:       Objective:   Vitals:   11/23/20 1017  BP: 118/72  Pulse: 72  Temp: 97.8 F (36.6 C)  SpO2: (!) 89%  Weight: 115 lb 6.4 oz (52.3 kg)  Height: 5' 5" (1.651 m)    Estimated body mass index is 19.2 kg/m as calculated from the following:   Height as of this encounter: 5' 5" (1.651 m).   Weight as of this encounter: 115 lb 6.4 oz (52.3 kg).  _0 @  Filed Weights   11/23/20 1017  Weight: 115 lb 6.4 oz (52.3 kg)     Physical Exam Frail female sitting on the wheelchair.  Some anterior crackles.  No edema.  Wearing sunglasses.  Normal heart sounds.  Abdomen soft      Assessment:       ICD-10-CM   1. Chronic respiratory failure with hypoxia (HCC)   J96.11   2. IPF (idiopathic pulmonary fibrosis) ([place])  J84.112   3. Physical deconditioning  R53.81        Plan:     Patient Instructions     ICD-10-CM   1. Chronic respiratory failure with hypoxia (HCC)  J96.11   2. IPF (idiopathic pulmonary fibrosis) ([place])  J84.112   3. 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 Physical deconditioning  R53.81    Slowly progressive pulmonary fibrosis but stable since last visit You are also significantly physically deconditioned I agree with you that long-term prednisone can be harmful for you Noted that you do not tolerate either pirfenidone or nintedanib  Cuirrent plan is supportive care  Plan -Overall continue supportive care -Use oxygen 3 L at rest and titrate as needed when you exert yourself for pulse ox goal greater than 86% --Continue home palliative care -We discussed no CPR no intubation as part of your goals of care  -You are going to reflect on this especially in the context that he want to live up to 87 years of age and also you want outlive your husband   Follow-up -30-minute office visit in 4-6 months  -ILD symptom questionnaire at follow-up  -Follow-up in [facility] location      SIGNATURE    Dr. Brand Males, M.D., F.C.C.P,  Pulmonary and Critical Care Medicine Staff Physician, [place] Director - Interstitial Lung Disease  Program  Pulmonary [place] at [place], [place], [address]  Pager: [phone], If no answer or between  15:00h - 7:00h: call [phone] Telephone: [phone]  11:01 AM 11/23/2020

## 2020-11-23 NOTE — Patient Instructions (Addendum)
ICD-10-CM   1. Chronic respiratory failure with hypoxia (HCC)  J96.11   2. IPF (idiopathic pulmonary fibrosis) ([place])  J84.112   3. 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 Physical deconditioning  R53.81    Slowly progressive pulmonary fibrosis but stable since last visit You are also significantly physically deconditioned I agree with you that long-term prednisone can be harmful for you Noted that you do not tolerate either pirfenidone or nintedanib  Cuirrent plan is supportive care  Plan -Overall continue supportive care -Use oxygen 3 L at rest and titrate as needed when you exert yourself for pulse ox goal greater than 86% --Continue home palliative care -We discussed no CPR no intubation as part of your goals of care  -You are going to reflect on this especially in the context that he want to live up to 87 years of age and also you want outlive your husband   Follow-up -30-minute office visit in 4-6 months  -ILD symptom questionnaire at follow-up  -Follow-up in [place] location

## 2020-12-05 DIAGNOSIS — M1611 Unilateral primary osteoarthritis, right hip: Secondary | ICD-10-CM | POA: Diagnosis not present

## 2020-12-05 DIAGNOSIS — R0902 Hypoxemia: Secondary | ICD-10-CM | POA: Diagnosis not present

## 2020-12-05 DIAGNOSIS — Z96649 Presence of unspecified artificial hip joint: Secondary | ICD-10-CM | POA: Diagnosis not present

## 2020-12-05 DIAGNOSIS — J849 Interstitial pulmonary disease, unspecified: Secondary | ICD-10-CM | POA: Diagnosis not present

## 2020-12-06 ENCOUNTER — Ambulatory Visit: Payer: Medicare HMO | Admitting: Orthopaedic Surgery

## 2020-12-06 ENCOUNTER — Other Ambulatory Visit: Payer: Self-pay

## 2020-12-19 DIAGNOSIS — J84112 Idiopathic pulmonary fibrosis: Secondary | ICD-10-CM | POA: Diagnosis not present

## 2020-12-19 DIAGNOSIS — Z515 Encounter for palliative care: Secondary | ICD-10-CM | POA: Diagnosis not present

## 2020-12-19 DIAGNOSIS — J9611 Chronic respiratory failure with hypoxia: Secondary | ICD-10-CM | POA: Diagnosis not present

## 2020-12-20 ENCOUNTER — Ambulatory Visit (INDEPENDENT_AMBULATORY_CARE_PROVIDER_SITE_OTHER): Payer: Medicare PPO | Admitting: Orthopaedic Surgery

## 2020-12-20 ENCOUNTER — Other Ambulatory Visit: Payer: Self-pay

## 2020-12-20 ENCOUNTER — Ambulatory Visit (INDEPENDENT_AMBULATORY_CARE_PROVIDER_SITE_OTHER): Payer: Medicare PPO

## 2020-12-20 DIAGNOSIS — M545 Low back pain, unspecified: Secondary | ICD-10-CM | POA: Diagnosis not present

## 2020-12-20 DIAGNOSIS — M47812 Spondylosis without myelopathy or radiculopathy, cervical region: Secondary | ICD-10-CM | POA: Diagnosis not present

## 2020-12-20 DIAGNOSIS — M542 Cervicalgia: Secondary | ICD-10-CM | POA: Diagnosis not present

## 2020-12-20 MED ORDER — PREDNISONE 5 MG PO TABS: ORAL_TABLET | ORAL | 0 refills | Status: AC

## 2020-12-20 NOTE — Progress Notes (Signed)
Office Visit Note   Patient: Ashlee Martin           Date of Birth: [birth year]           MRN: [id number] Visit Date: 12/20/2020              Requested by: Glenda Chroman, MD [place],  [place] [id number] PCP: Glenda Chroman, MD   Assessment & Plan: Visit Diagnoses:  1. Neck pain   2. Low back pain, unspecified back pain laterality, unspecified chronicity, unspecified whether sciatica present   3. Facet arthritis, degenerative, cervical spine     Plan: Old L2 compression previously described CT scan 2020.  Cervical facet arthropathy mid cervical without listhesis.  We will send in prednisone 5 mg Dosepak.  Follow-up as needed.  Follow-Up Instructions: No follow-ups on file.   Orders:  Orders Placed This Encounter  Procedures  . XR Cervical Spine 2 or 3 views  . XR Lumbar Spine 2-3 Views   Meds ordered this encounter  Medications  . predniSONE (DELTASONE) 5 MG tablet    Sig: Take 6 then 5, then 4,3,2,1 with one pill less each day. Take with food    Dispense:  21 tablet    Refill:  0      Procedures: No procedures performed   Clinical Data: No additional findings.   Subjective: Chief Complaint  Patient presents with  . Other    Back pain    HPI 87 year old female on chronic oxygen for pulmonary fibrosis is here with her husband.  Patient drove here and husband helped her get out of car into a wheelchair.  She complains of severe pain in her neck pain in her back.  Previous CT scan for pulmonary fibrosis abdomen pelvis also showed L2 endplate fracture.  She states she did have a fall last month.  She has severe pain in her neck states it gradually worked its way down in the lumbar spine.  She lives with her husband who has health problems as well.  Review of Systems positive for pulmonary fibrosis on chronic oxygen.  Thin with low BMI.   Objective: Vital Signs: There were no vitals taken for this visit.  Physical Exam Constitutional:      Appearance:  She is well-developed.     Comments: 10 chronic ill appearance on portable oxygen.  HENT:     Head: Normocephalic.     Right Ear: External ear normal.     Left Ear: External ear normal.  Eyes:     Pupils: Pupils are equal, round, and reactive to light.  Neck:     Thyroid: No thyromegaly.     Trachea: No tracheal deviation.  Cardiovascular:     Rate and Rhythm: Normal rate.  Pulmonary:     Effort: Pulmonary effort is normal.  Abdominal:     Palpations: Abdomen is soft.  Skin:    General: Skin is warm and dry.  Neurological:     Mental Status: She is alert and oriented to person, place, and time.  Psychiatric:        Behavior: Behavior normal.     Ortho Exam patient is more than 50% cervical range of motion fairly quickly without sharp pain.  Some brachial plexus tenderness negative Lhermitte negative cervical compression.  [place] extremity reflexes biceps triceps brachial radialis are 2+ and symmetrical no interosseous thenar or hypothenar atrophy.  Specialty Comments:  No specialty comments available.  Imaging: XR Cervical Spine 2  or 3 views  Result Date: 12/20/2020 [facility] lateral cervical spine x-rays are obtained and reviewed this shows facet arthropathy mid cervical slightly worse left than right.  No listhesis on [facility] x-rays mild endplate spurring. Impression: Cervical facet arthropathy mid cervical right and left.  XR Lumbar Spine 2-3 Views  Result Date: 12/20/2020 [facility] lateral lumbar spine x-rays are obtained and reviewed.  This shows minimal compression L2 as described by previous CT scan September 2020.  No other compression fractures at other levels no listhesis. Impression: Old L2 compression fracture.  Mild facet arthropathy.    PMFS History: Patient Active Problem List   Diagnosis Date Noted  . Facet arthritis, degenerative, cervical spine 12/20/2020  . Back pain 10/20/2019  . Abnormal CT scan, stomach   . Rectal bleeding 04/26/2019  . Chronic diarrhea 04/26/2019   . UTI (urinary tract infection) 12/27/2018  . Drug-induced diarrhea 12/16/2018  . Therapeutic drug monitoring 08/05/2018  . Acute respiratory failure with hypoxia ([place]) 08/05/2018  . GERD (gastroesophageal reflux disease) 08/04/2018  . Fatty liver 08/04/2018  . Nausea and vomiting 04/14/2018  . Allergic rhinitis 12/08/2017  . IPF (idiopathic pulmonary fibrosis) ([place]) 12/08/2017   Past Medical History:  Diagnosis Date  . Arthritis   . Coronary artery calcification seen on CT scan   . Depression   . Essential hypertension   . GERD (gastroesophageal reflux disease)   . History of seizures   . Hypercholesterolemia   . ILD (interstitial lung disease) ([place])   . Psoriasis    Had been on Humira but continued to have UTIs, sinus infections, etc. Taken off.    Family History  Problem Relation Age of Onset  . Heart disease Mother   . Heart disease Father   . Cancer Sister        GYN  . Colon cancer Neg Hx     Past Surgical History:  Procedure Laterality Date  . BIOPSY  04/20/2018   Procedure: BIOPSY;  Surgeon: Danie Binder, MD;  Location: [facility];  Service: Endoscopy;;  gastric  . COLONOSCOPY  01/2011   Dr. Anthony Sar: normal.   . COLONOSCOPY WITH PROPOFOL N/A 05/31/2019   normal TI, multiple small and large-mouthed diverticula in recto-sgimoid, sigmoid, transverse  . DILATION AND CURETTAGE OF UTERUS     x3  . ESOPHAGOGASTRODUODENOSCOPY (EGD) WITH PROPOFOL N/A 04/20/2018   benign-appearing, intrinsic mild stenosis, s/p dilation. Mild inflammation entire stomach, s/p biopsy that was negative for H.pylori. Pylorus normal but had changes consistent with PRIOR ulcer disease. Duodenum normal.   . ESOPHAGOGASTRODUODENOSCOPY (EGD) WITH PROPOFOL N/A 05/31/2019   mild gastirits, findings on CT likely due to sliding hiatal hernia, benign-appearing esophageal stricture due to GERD.  Marland Kitchen SAVORY DILATION N/A 04/20/2018   Procedure: SAVORY DILATION;  Surgeon: Danie Binder, MD;  Location: [facility];  Service: Endoscopy;  Laterality: N/A;  . VIDEO BRONCHOSCOPY Bilateral 01/07/2018   Procedure: VIDEO BRONCHOSCOPY WITHOUT FLUORO;  Surgeon: Brand Males, MD;  Location: [facility];  Service: Cardiopulmonary;  Laterality: Bilateral;  . WRIST SURGERY Right    ORIF   Social History   Occupational History  . Occupation: Retired  Tobacco Use  . Smoking status: Former Smoker    Packs/day: 3.00    Years: 20.00    Pack years: 60.00    Types: Cigarettes    Quit date: 11/03/1972    Years since quitting: 48.1  . Smokeless tobacco: Never Used  Vaping Use  . Vaping Use: Never used  Substance and Sexual Activity  . Alcohol use: Not Currently    Alcohol/week: 1.0 - 2.0 standard drink    Types: 1 - 2 Shots of liquor per week    Comment: May have before bed  . Drug use: Never  . Sexual activity: Yes    Birth control/protection: None

## 2020-12-26 DIAGNOSIS — M5030 Other cervical disc degeneration, unspecified cervical region: Secondary | ICD-10-CM | POA: Diagnosis not present

## 2020-12-26 DIAGNOSIS — W19XXXA Unspecified fall, initial encounter: Secondary | ICD-10-CM | POA: Diagnosis not present

## 2020-12-26 DIAGNOSIS — I517 Cardiomegaly: Secondary | ICD-10-CM | POA: Diagnosis not present

## 2020-12-26 DIAGNOSIS — J8489 Other specified interstitial pulmonary diseases: Secondary | ICD-10-CM | POA: Diagnosis not present

## 2020-12-26 DIAGNOSIS — Z043 Encounter for examination and observation following other accident: Secondary | ICD-10-CM | POA: Diagnosis not present

## 2020-12-26 DIAGNOSIS — R0902 Hypoxemia: Secondary | ICD-10-CM | POA: Diagnosis not present

## 2020-12-26 DIAGNOSIS — I130 Hypertensive heart and chronic kidney disease with heart failure and stage 1 through stage 4 chronic kidney disease, or unspecified chronic kidney disease: Secondary | ICD-10-CM | POA: Diagnosis not present

## 2020-12-26 DIAGNOSIS — N189 Chronic kidney disease, unspecified: Secondary | ICD-10-CM | POA: Diagnosis not present

## 2020-12-26 DIAGNOSIS — E7800 Pure hypercholesterolemia, unspecified: Secondary | ICD-10-CM | POA: Diagnosis not present

## 2020-12-26 DIAGNOSIS — S0990XA Unspecified injury of head, initial encounter: Secondary | ICD-10-CM | POA: Diagnosis not present

## 2020-12-26 DIAGNOSIS — S299XXA Unspecified injury of thorax, initial encounter: Secondary | ICD-10-CM | POA: Diagnosis not present

## 2020-12-26 DIAGNOSIS — R55 Syncope and collapse: Secondary | ICD-10-CM | POA: Diagnosis not present

## 2020-12-26 DIAGNOSIS — J929 Pleural plaque without asbestos: Secondary | ICD-10-CM | POA: Diagnosis not present

## 2020-12-26 DIAGNOSIS — S199XXA Unspecified injury of neck, initial encounter: Secondary | ICD-10-CM | POA: Diagnosis not present

## 2020-12-26 DIAGNOSIS — Z7982 Long term (current) use of aspirin: Secondary | ICD-10-CM | POA: Diagnosis not present

## 2020-12-26 DIAGNOSIS — J8410 Pulmonary fibrosis, unspecified: Secondary | ICD-10-CM | POA: Diagnosis not present

## 2020-12-26 DIAGNOSIS — R531 Weakness: Secondary | ICD-10-CM | POA: Diagnosis not present

## 2020-12-26 DIAGNOSIS — J849 Interstitial pulmonary disease, unspecified: Secondary | ICD-10-CM | POA: Diagnosis not present

## 2020-12-26 DIAGNOSIS — N179 Acute kidney failure, unspecified: Secondary | ICD-10-CM | POA: Diagnosis not present

## 2020-12-26 DIAGNOSIS — R790 Abnormal level of blood mineral: Secondary | ICD-10-CM | POA: Diagnosis not present

## 2020-12-26 DIAGNOSIS — G319 Degenerative disease of nervous system, unspecified: Secondary | ICD-10-CM | POA: Diagnosis not present

## 2020-12-26 DIAGNOSIS — I700 Atherosclerosis of aorta: Secondary | ICD-10-CM | POA: Diagnosis not present

## 2020-12-26 DIAGNOSIS — I959 Hypotension, unspecified: Secondary | ICD-10-CM | POA: Diagnosis not present

## 2020-12-26 DIAGNOSIS — R778 Other specified abnormalities of plasma proteins: Secondary | ICD-10-CM | POA: Diagnosis not present

## 2020-12-26 DIAGNOSIS — N3001 Acute cystitis with hematuria: Secondary | ICD-10-CM | POA: Diagnosis not present

## 2020-12-26 DIAGNOSIS — I7789 Other specified disorders of arteries and arterioles: Secondary | ICD-10-CM | POA: Diagnosis not present

## 2020-12-26 DIAGNOSIS — I509 Heart failure, unspecified: Secondary | ICD-10-CM | POA: Diagnosis not present

## 2020-12-31 DIAGNOSIS — K529 Noninfective gastroenteritis and colitis, unspecified: Secondary | ICD-10-CM | POA: Diagnosis not present

## 2021-01-04 DIAGNOSIS — J849 Interstitial pulmonary disease, unspecified: Secondary | ICD-10-CM | POA: Diagnosis not present

## 2021-01-04 DIAGNOSIS — R0902 Hypoxemia: Secondary | ICD-10-CM | POA: Diagnosis not present

## 2021-01-04 DIAGNOSIS — Z96649 Presence of unspecified artificial hip joint: Secondary | ICD-10-CM | POA: Diagnosis not present

## 2021-01-04 DIAGNOSIS — M1611 Unilateral primary osteoarthritis, right hip: Secondary | ICD-10-CM | POA: Diagnosis not present

## 2021-01-12 DIAGNOSIS — Z87891 Personal history of nicotine dependence: Secondary | ICD-10-CM | POA: Diagnosis not present

## 2021-01-12 DIAGNOSIS — N179 Acute kidney failure, unspecified: Secondary | ICD-10-CM | POA: Diagnosis not present

## 2021-01-12 DIAGNOSIS — E872 Acidosis: Secondary | ICD-10-CM | POA: Diagnosis not present

## 2021-01-12 DIAGNOSIS — A419 Sepsis, unspecified organism: Secondary | ICD-10-CM | POA: Diagnosis not present

## 2021-01-12 DIAGNOSIS — N390 Urinary tract infection, site not specified: Secondary | ICD-10-CM | POA: Diagnosis not present

## 2021-01-12 DIAGNOSIS — Z452 Encounter for adjustment and management of vascular access device: Secondary | ICD-10-CM | POA: Diagnosis not present

## 2021-01-12 DIAGNOSIS — R0902 Hypoxemia: Secondary | ICD-10-CM | POA: Diagnosis not present

## 2021-01-12 DIAGNOSIS — Z9981 Dependence on supplemental oxygen: Secondary | ICD-10-CM | POA: Diagnosis not present

## 2021-01-12 DIAGNOSIS — N3001 Acute cystitis with hematuria: Secondary | ICD-10-CM | POA: Diagnosis not present

## 2021-01-12 DIAGNOSIS — Z20822 Contact with and (suspected) exposure to covid-19: Secondary | ICD-10-CM | POA: Diagnosis not present

## 2021-01-12 DIAGNOSIS — J849 Interstitial pulmonary disease, unspecified: Secondary | ICD-10-CM | POA: Diagnosis not present

## 2021-01-12 DIAGNOSIS — I10 Essential (primary) hypertension: Secondary | ICD-10-CM | POA: Diagnosis not present

## 2021-01-12 DIAGNOSIS — R519 Headache, unspecified: Secondary | ICD-10-CM | POA: Diagnosis not present

## 2021-01-12 DIAGNOSIS — I222 Subsequent non-ST elevation (NSTEMI) myocardial infarction: Secondary | ICD-10-CM | POA: Diagnosis not present

## 2021-01-12 DIAGNOSIS — R0689 Other abnormalities of breathing: Secondary | ICD-10-CM | POA: Diagnosis not present

## 2021-01-12 DIAGNOSIS — J9691 Respiratory failure, unspecified with hypoxia: Secondary | ICD-10-CM | POA: Diagnosis not present

## 2021-01-12 DIAGNOSIS — Z515 Encounter for palliative care: Secondary | ICD-10-CM | POA: Diagnosis not present

## 2021-01-12 DIAGNOSIS — J9690 Respiratory failure, unspecified, unspecified whether with hypoxia or hypercapnia: Secondary | ICD-10-CM | POA: Diagnosis not present

## 2021-01-12 DIAGNOSIS — R6521 Severe sepsis with septic shock: Secondary | ICD-10-CM | POA: Diagnosis not present

## 2021-01-12 DIAGNOSIS — K219 Gastro-esophageal reflux disease without esophagitis: Secondary | ICD-10-CM | POA: Diagnosis not present

## 2021-01-12 DIAGNOSIS — R52 Pain, unspecified: Secondary | ICD-10-CM | POA: Diagnosis not present

## 2021-01-12 DIAGNOSIS — I959 Hypotension, unspecified: Secondary | ICD-10-CM | POA: Diagnosis not present

## 2021-01-12 DIAGNOSIS — E874 Mixed disorder of acid-base balance: Secondary | ICD-10-CM | POA: Diagnosis not present

## 2021-01-12 DIAGNOSIS — N189 Chronic kidney disease, unspecified: Secondary | ICD-10-CM | POA: Diagnosis not present

## 2021-01-12 DIAGNOSIS — R0602 Shortness of breath: Secondary | ICD-10-CM | POA: Diagnosis not present

## 2021-01-12 DIAGNOSIS — I21A1 Myocardial infarction type 2: Secondary | ICD-10-CM | POA: Diagnosis not present

## 2021-01-12 DIAGNOSIS — J8410 Pulmonary fibrosis, unspecified: Secondary | ICD-10-CM | POA: Diagnosis not present

## 2021-01-23 DEATH — deceased

## 2021-02-05 ENCOUNTER — Encounter: Payer: Self-pay | Admitting: Internal Medicine

## 2021-02-05 ENCOUNTER — Ambulatory Visit: Payer: Medicare HMO | Admitting: Gastroenterology

## 2021-08-05 IMAGING — CT CT CHEST HIGH RESOLUTION W/O CM
2 of 4 series · 15 of 36 positions shown, 18 images · non-contrast
Comparison: 05/18/2019 06/15/2018 and 11/13/2017.

CLINICAL DATA: Idiopathic pulmonary fibrosis, pulmonary nodule.

EXAM:
CT CHEST WITHOUT CONTRAST
TECHNIQUE: Multidetector CT imaging of the chest was performed following the
standard protocol without intravenous contrast. High resolution
imaging of the lungs, as well as inspiratory and expiratory imaging,
was performed.

[Series 4: thorax · axial · 0.54mm/px · z∈[+1267,+1545]mm · 12 of 153 slices shown, 15 images]
[im 7/153  mediastinal]
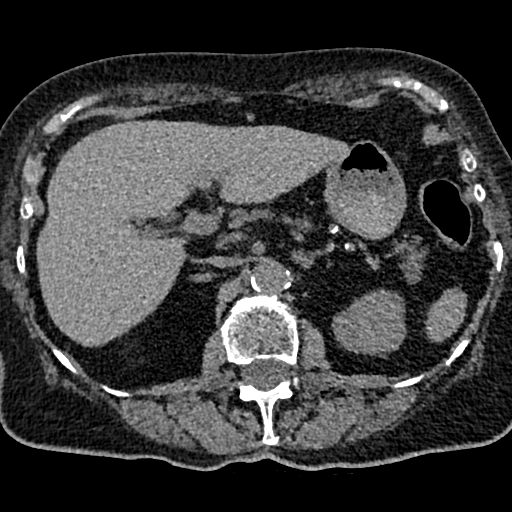
[im 7/153  lung]
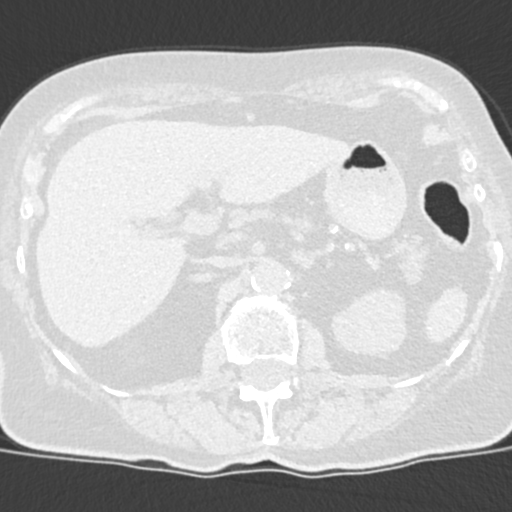
[im 20/153  lung]
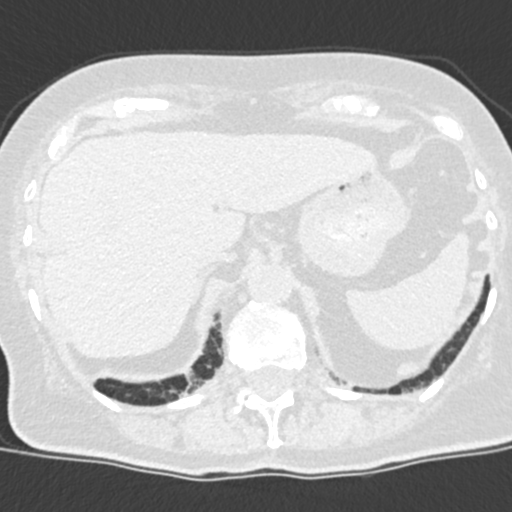
[im 34/153  lung]
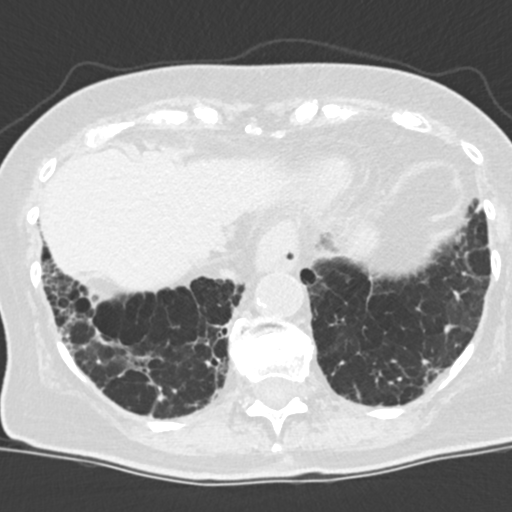
[im 47/153  lung]
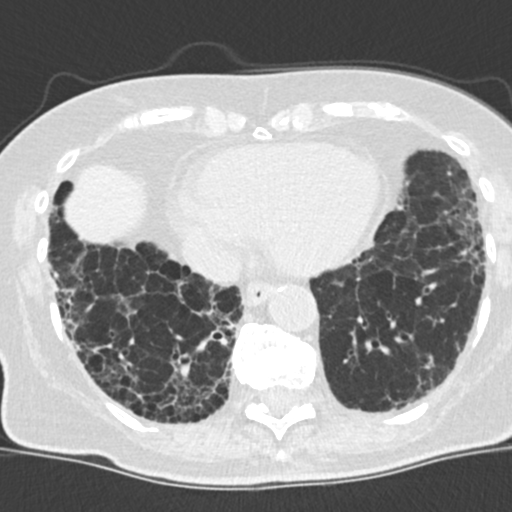
[im 60/153  mediastinal]
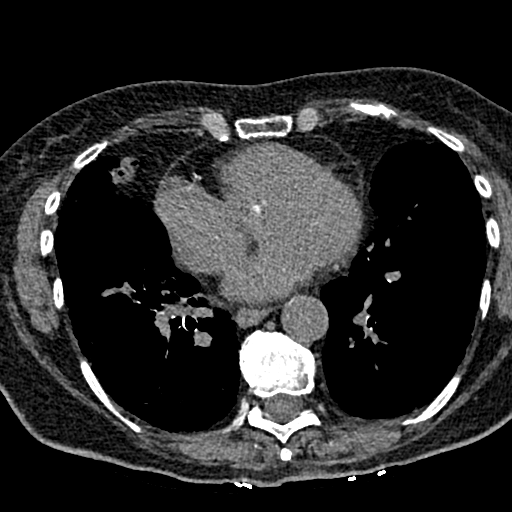
[im 60/153  lung]
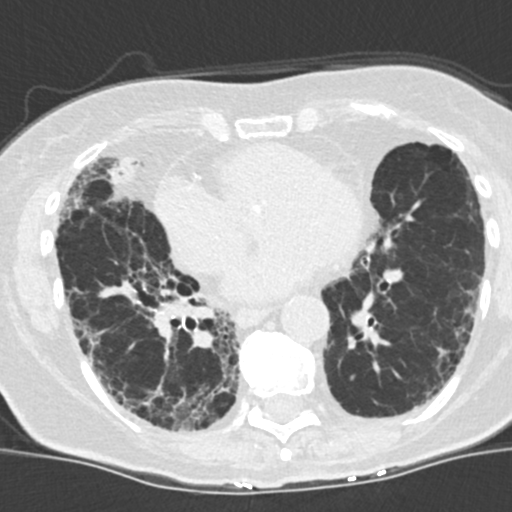
[im 73/153  lung]
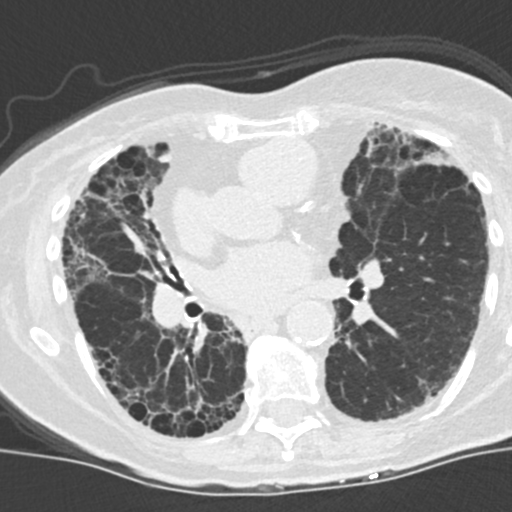
[im 80/153  lung]
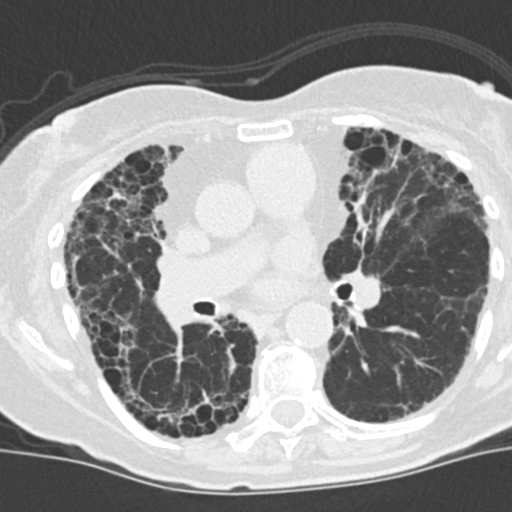
[im 93/153  lung]
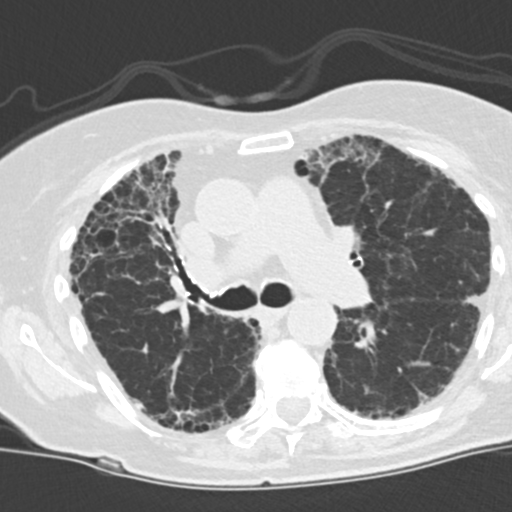
[im 106/153  mediastinal]
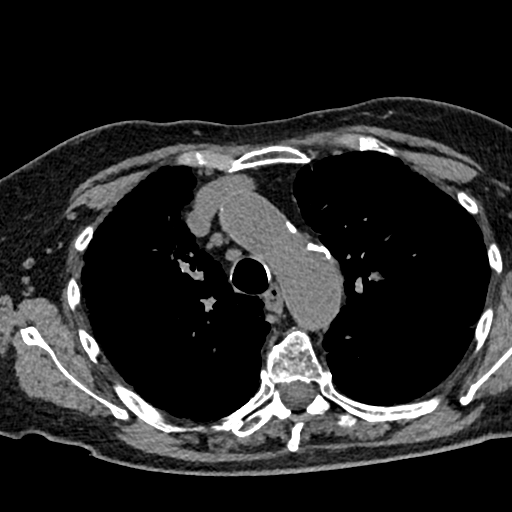
[im 106/153  lung]
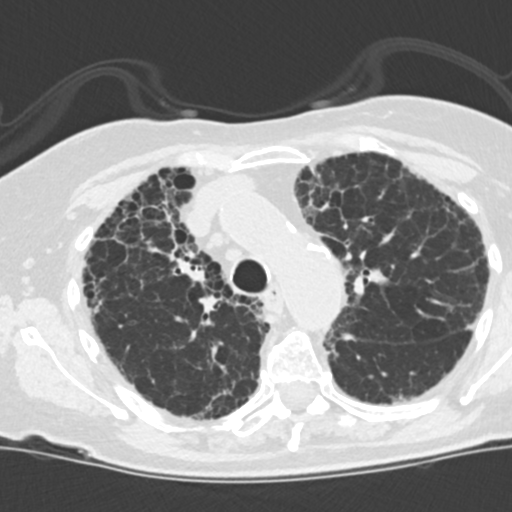
[im 119/153  lung]
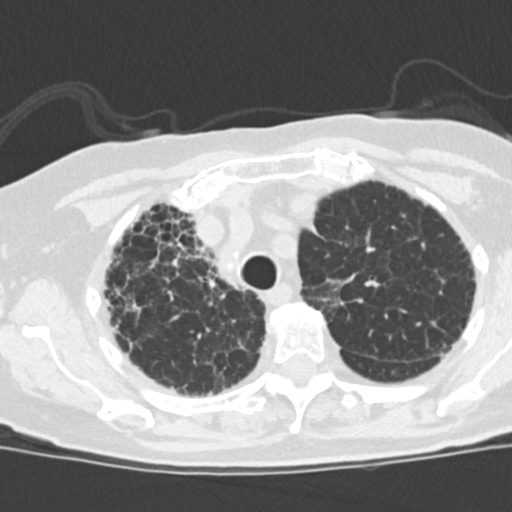
[im 133/153  lung]
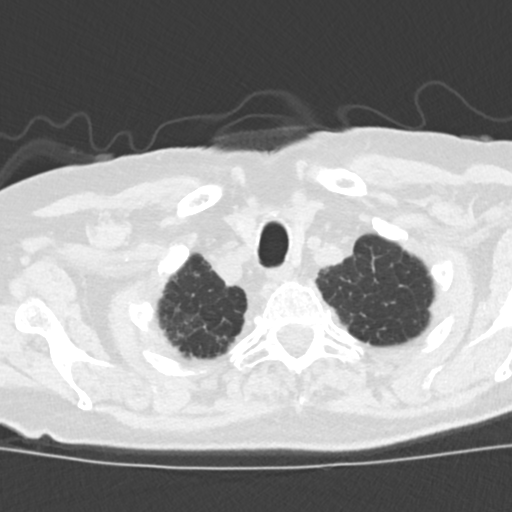
[im 146/153  lung]
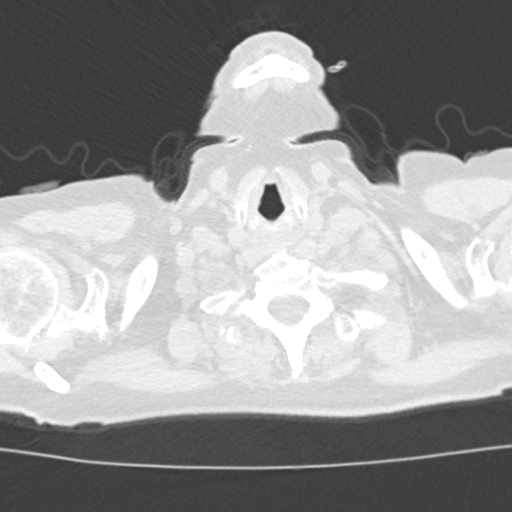

[Series 8: coronal · coronal · 0.61mm/px · 3 of 110 slices shown]
[im 22/110  lung]
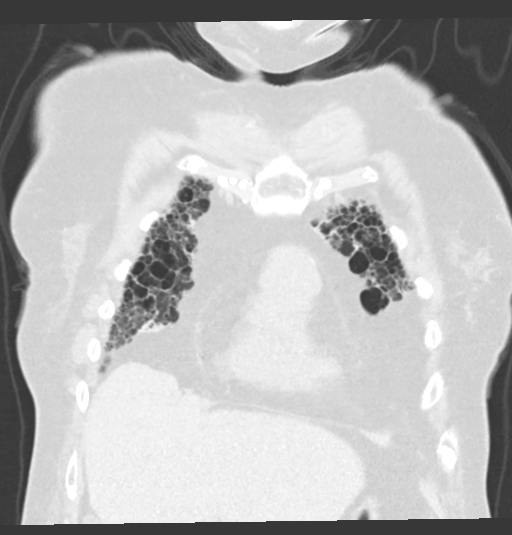
[im 44/110  lung]
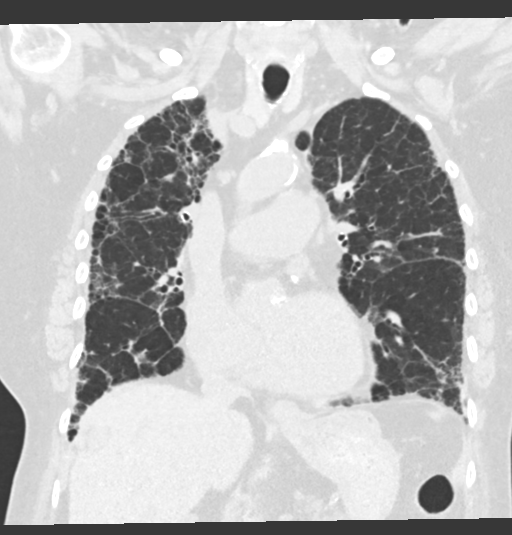
[im 66/110  lung]
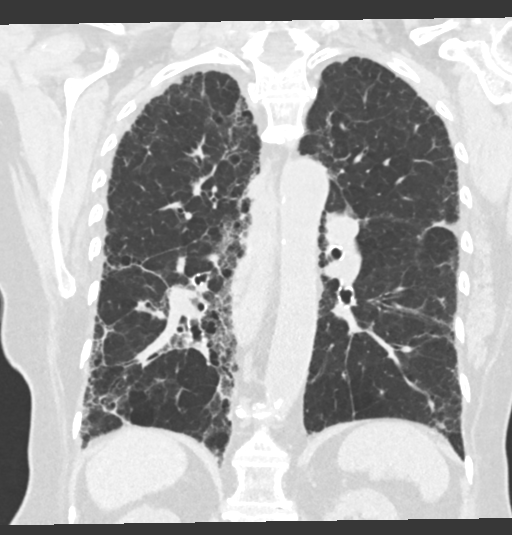

[15 of 36 positions shown; findings below may reference images not displayed]

FINDINGS: Cardiovascular: Atherosclerotic calcification of the aorta, aortic
valve and coronary arteries. Distal aortic arch measures 3.0 cm.
Pulmonic trunk is enlarged. Heart is at the upper limits of normal
in size to mildly enlarged. No pericardial effusion.

Mediastinum/Nodes: No pathologically enlarged mediastinal or
axillary lymph nodes. Hilar regions are difficult to definitively
evaluate without IV contrast. Esophagus is grossly unremarkable.

Lungs/Pleura: Peripheral interstitial coarsening, traction
bronchiectasis/bronchiolectasis, subpleural reticulation and
honeycombing, similar to comparison exams. 3 mm peripheral left
lower lobe nodule (6/121), unchanged and benign. No pleural fluid.
Airway is unremarkable.

Upper Abdomen: Liver margin is irregular. Visualized portions of the
liver, adrenal glands, kidneys, spleen, pancreas, stomach and bowel
are otherwise unremarkable with the exception of a small hiatal
hernia.

Musculoskeletal: Degenerative changes in the spine. No worrisome
lytic or sclerotic lesions.
IMPRESSION: 1. Pulmonary parenchymal pattern of fibrosis is unchanged from
11/13/2017 and likely due to usual interstitial pneumonitis.
Findings are categorized as probable UIP per consensus guidelines:
Diagnosis of Idiopathic Pulmonary Fibrosis: An Official
ATS/ERS/JRS/ALAT Clinical Practice Guideline. Am J Respir Crit Care
Med Vol 198, Antw[place] 5, [id number][DATE].
2. Distal aortic arch aneurysm. Aortic aneurysm NOS (ED387-9I0.Y).
Recommend annual imaging followup by CTA or MRA. This recommendation
follows 5828 ACCF/AHA/AATS/ACR/ASA/SCA/ONIPA/FEAKER/TIGER/SEBATHU Guidelines
for the Diagnosis and Management of Patients with Thoracic Aortic
Disease. Circulation.5828; 121: E266-e369. Aortic aneurysm NOS
(ED387-9I0.Y).
3. Marginal irregularity the liver is indicative of cirrhosis.
4. Aortic atherosclerosis (ED387-1FT.T). Coronary artery
calcification.
5. Enlarged pulmonic trunk, indicative of pulmonary arterial
hypertension.
